# Patient Record
Sex: Male | Born: 1985 | Race: White | Hispanic: No | Marital: Single | Smoking: Former smoker
Health system: Southern US, Community
[De-identification: ages and names within clinical notes are randomized; demographics above are authoritative.]

## PROBLEM LIST (undated history)

## (undated) DIAGNOSIS — F419 Anxiety disorder, unspecified: Secondary | ICD-10-CM

## (undated) DIAGNOSIS — Z993 Dependence on wheelchair: Secondary | ICD-10-CM

## (undated) DIAGNOSIS — F329 Major depressive disorder, single episode, unspecified: Secondary | ICD-10-CM

## (undated) DIAGNOSIS — D497 Neoplasm of unspecified behavior of endocrine glands and other parts of nervous system: Secondary | ICD-10-CM

## (undated) DIAGNOSIS — R519 Headache, unspecified: Secondary | ICD-10-CM

## (undated) DIAGNOSIS — F32A Depression, unspecified: Secondary | ICD-10-CM

## (undated) DIAGNOSIS — G8250 Quadriplegia, unspecified: Secondary | ICD-10-CM

## (undated) DIAGNOSIS — R258 Other abnormal involuntary movements: Secondary | ICD-10-CM

## (undated) DIAGNOSIS — J189 Pneumonia, unspecified organism: Secondary | ICD-10-CM

## (undated) DIAGNOSIS — R252 Cramp and spasm: Secondary | ICD-10-CM

## (undated) HISTORY — DX: Quadriplegia, unspecified: G82.50

## (undated) HISTORY — DX: Cramp and spasm: R25.2

## (undated) HISTORY — DX: Dependence on wheelchair: Z99.3

## (undated) HISTORY — DX: Other abnormal involuntary movements: R25.8

## (undated) HISTORY — PX: NO PAST SURGERIES: SHX2092

## (undated) HISTORY — DX: Neoplasm of unspecified behavior of endocrine glands and other parts of nervous system: D49.7

---
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
## 2019-05-10 ENCOUNTER — Other Ambulatory Visit: Payer: Self-pay

## 2019-05-10 ENCOUNTER — Ambulatory Visit: Payer: Self-pay | Admitting: Neurology

## 2019-05-10 ENCOUNTER — Encounter: Payer: Self-pay | Admitting: Neurology

## 2019-05-10 VITALS — BP 125/81 | HR 103 | Temp 97.1°F | Ht 70.5 in | Wt 169.5 lb

## 2019-05-10 DIAGNOSIS — H539 Unspecified visual disturbance: Secondary | ICD-10-CM

## 2019-05-10 DIAGNOSIS — R292 Abnormal reflex: Secondary | ICD-10-CM | POA: Insufficient documentation

## 2019-05-10 DIAGNOSIS — R29898 Other symptoms and signs involving the musculoskeletal system: Secondary | ICD-10-CM

## 2019-05-10 DIAGNOSIS — R200 Anesthesia of skin: Secondary | ICD-10-CM | POA: Diagnosis not present

## 2019-05-10 NOTE — Progress Notes (Signed)
[facility]  PATIENT: Jacob Lambert DOB: [id number]  REFERRING DOCTOR OR PCP: Self-referred SOURCE: Patient, MRI images personally reviewed.  _________________________________   HISTORICAL  CHIEF COMPLAINT:  Chief Complaint  Patient presents with  . Follow-up    RM 13, alone.  Referral for MS/bilateral leg numbness/tingling/stiffness and tremor.   . Gait Problem    Ambulates with cane    HISTORY OF PRESENT ILLNESS:  I had the pleasure seeing Jacob Councilman MS center at [facility] for a neurologic consultation regarding his numbness, weakness and reduced balance over the past couple months.  He is a 34 year old man who has had issues with his gait about 3 months ago.   He felt stiff and also noted pain in his toes.    On April 1st he woke up and was unable to get out of bed due to weakness.   He went to urgent care and then was sent to the ED.    At the time, he was expeiencing pain in his feet.   He had a tremors in his legs.   He notes weakness in the legs, fairly symmetric.   Legs feel heavy.    He has had spasticity in his legs.   He is getting a squeezing sensation in the flank.   If he squats, he has trouble getting back up.   He has pain in the legs, seemingly more from muscles and joints than the skin.  He also has numbness in his legs.  Over the last week, he feels typing is more difficult and he shakes when he keyboards.   He started using a cane due to reduced balance in mid April.   He is especially having trouble going up and down stairs.  He has fallen a few times.  He noted more weakness in his legs after ejaculation.  He has noted mild urinary urgency.  He has also has noted ED the last month.     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   He also notes shapes are different with his right vision.   He has stopped driving due to his symptoms.     He had a lumbar MRI in [facility]  04/12/19.  I personally reviewed the images.  It is normal.  There is no significant disc degenerative changes.  The quality of the MRI is low and I do not get a good view of the conus medullaris.  He has mild psoriasis and eczema but is otherwise healthy.     He has no FH of MS.     REVIEW OF SYSTEMS: Constitutional: No fevers, chills, sweats, or change in appetite Eyes: No visual changes, double vision, eye pain Ear, nose and throat: No hearing loss, ear pain, nasal congestion, sore throat Cardiovascular: No chest pain, palpitations Respiratory: No shortness of breath at rest or with exertion.   No wheezes GastrointestinaI: No nausea, vomiting, diarrhea, abdominal pain, fecal incontinence Genitourinary:No dysuria.  He has noted ED and mild urinary urgency. Musculoskeletal: No neck pain, back pain Integumentary: No rash, pruritus, skin lesions at this time but he has a history of psoriasis/eczema Neurological: as above Psychiatric: No depression at this time.  No anxiety Endocrine: No palpitations, diaphoresis, change in appetite, change in weigh or increased thirst Hematologic/Lymphatic: No anemia, purpura, petechiae. Allergic/Immunologic: No itchy/runny eyes, nasal congestion, recent allergic reactions, rashes  ALLERGIES: Allergies  Allergen Reactions  .  Cephalosporins   . Other     Patagonion toothfish    HOME MEDICATIONS:  Current Outpatient Medications:  .  methocarbamol (ROBAXIN) 500 MG tablet, Take 500 mg by mouth 2 (two) times daily., Disp: , Rfl:   PAST MEDICAL HISTORY: No past medical history on file.  PAST SURGICAL HISTORY:   FAMILY HISTORY: No family history on file.  SOCIAL HISTORY:  Social History   Socioeconomic History  . Marital status: Single    Spouse name: Not on file  . Number of children: Not on file  . Years of education: Not on file  . Highest education level: Not on file  Occupational History  . Not on file  Tobacco Use  . Smoking  status: Former Research scientist (life sciences)  . Smokeless tobacco: Never Used  Substance and Sexual Activity  . Alcohol use: Yes    Comment: 2 bottles of wine per week  . Drug use: Not Currently  . Sexual activity: Not on file  Other Topics Concern  . Not on file  Social History Narrative   Lives with parents   Caffeine use: minimum- 3 cups per day (on average 6-10 cups per day)   Left handed    Social Determinants of Health   Financial Resource Strain:   . Difficulty of Paying Living Expenses:   Food Insecurity:   . Worried About Charity fundraiser in the Last Year:   . Arboriculturist in the Last Year:   Transportation Needs:   . Film/video editor (Medical):   Marland Kitchen Lack of Transportation (Non-Medical):   Physical Activity:   . Days of Exercise per Week:   . Minutes of Exercise per Session:   Stress:   . Feeling of Stress :   Social Connections:   . Frequency of Communication with Friends and Family:   . Frequency of Social Gatherings with Friends and Family:   . Attends Religious Services:   . Active Member of Clubs or Organizations:   . Attends Archivist Meetings:   Marland Kitchen Marital Status:   Intimate Partner Violence:   . Fear of Current or Ex-Partner:   . Emotionally Abused:   Marland Kitchen Physically Abused:   . Sexually Abused:      PHYSICAL EXAM  Vitals:   05/10/19 1319  BP: 125/81  Pulse: (!) 103  Temp: (!) 97.1 F (36.2 C)  Weight: 169 lb 8 oz (76.9 kg)  Height: 5' 10.5" (1.791 m)    Body mass index is 23.98 kg/m.   General: The patient is well-developed and well-nourished and in no acute distress  HEENT:  Head is [place]/AT.  Sclera are anicteric.  Funduscopic exam shows normal optic discs and retinal vessels.  Neck: No carotid bruits are noted.  The neck is nontender.  Cardiovascular: The heart has a regular rate and rhythm with a normal S1 and S2. There were no murmurs, gallops or rubs.    Skin: Extremities are without rash or  edema.  Musculoskeletal:  Back is  nontender  Neurologic Exam  Mental status: The patient is alert and oriented x 3 at the time of the examination. The patient has apparent normal recent and remote memory, with an apparently normal attention span and concentration ability.   Speech is normal.  Cranial nerves: Extraocular movements are full. Pupils are equal, round, and reactive to light and accomodation.  Visual fields are full.  Facial symmetry is present. There is good facial sensation to soft touch bilaterally.Facial strength is  normal.  Trapezius and sternocleidomastoid strength is normal. No dysarthria is noted.  The tongue is midline, and the patient has symmetric elevation of the soft palate. No obvious hearing deficits are noted.  Motor:  Muscle bulk is normal.   Tone is normal. Strength is  5 / 5 in all 4 extremities on isolated testing.  However, there is mild weakness in the left leg when standing on the left toes or heels.   Sensory: Sensory testing is intact to pinprick, soft touch and vibration sensation in the arms and allodynia in the feet.  Coordination: Cerebellar testing reveals good finger-nose-finger and heel-to-shin bilaterally.  Gait and station: Station is normal.   Gait is normal. Tandem gait is reduced. Romberg is negative.   Reflexes: Deep tendon reflexes are mildly increased in arms.  In legs, he has nonsustained clonus at the knees and ankles.    Plantar responses are flexor.      ASSESSMENT AND PLAN  Numbness - Plan: Vitamin B12, Copper, serum, MR BRAIN W WO CONTRAST, MR CERVICAL SPINE W WO CONTRAST, MR THORACIC SPINE W WO CONTRAST  Weakness of left lower extremity - Plan: MR CERVICAL SPINE W WO CONTRAST, MR THORACIC SPINE W WO CONTRAST  Hyperreflexia - Plan: Vitamin B12, Copper, serum, MR BRAIN W WO CONTRAST, MR CERVICAL SPINE W WO CONTRAST, MR THORACIC SPINE W WO CONTRAST  Visual disturbance - Plan: MR BRAIN W WO CONTRAST  In summary, Jacob Lambert is a 34 year old man with a 2 to 64-month  history of weakness pain, predominantly in the legs and reduced balance.  On examination, he has hyperreflexia.  I discussed with him that I am most concerned about a spinal cord process.  This could be an intrinsic or extrinsic myelopathy.  Demyelination such as MS is possible at his age.  We will check an MRI of the cervical and thoracic spine as well as the brain to determine if there is any evidence of demyelination, ischemia, other intrinsic myelopathy or a compressive extrinsic myelopathy such as large disc herniation or other process.  I will also check B12 and copper levels.  He is advised to avoid heavy lifting until a compressive myelopathy has been ruled out.     We will let him know the results of the studies and arrange follow-up based on the results.  In the interim, he should call us if he has any new or worsening neurologic symptoms.   Sadae Arrazola A. Felecia Shelling, MD, [facility] [id number], [id number] PM Certified in Neurology, Clinical Neurophysiology, Sleep Medicine and Neuroimaging  [facility] [address], [place], [place] [address] [phone]

## 2019-05-13 LAB — COPPER, SERUM: Copper: 105 ug/dL (ref 69–132)

## 2019-05-13 LAB — VITAMIN B12: Vitamin B-12: 474 pg/mL (ref 232–1245)

## 2019-05-15 ENCOUNTER — Telehealth: Payer: Self-pay | Admitting: *Deleted

## 2019-05-15 NOTE — Telephone Encounter (Signed)
-----   Message from Britt Bottom, MD sent at 05/13/2019  9:17 AM EDT ----- Please let the patient know that the lab work is fine.  We will let him know the MRi resukts after they are done

## 2019-05-15 NOTE — Telephone Encounter (Signed)
Called pt. Relayed Dr. Garth Bigness message. He verbalized understanding.

## 2019-05-18 ENCOUNTER — Telehealth: Payer: Self-pay | Admitting: Neurology

## 2019-05-18 NOTE — Telephone Encounter (Signed)
no to the covid questions MR Brain w/wo contrast, MR Cervical spine w/wo contrast & MR Thoracic spine w/wo contrast Dr. Cheree Ditto Auth: [id number] (exp. 05/18/19 to 11/13/19). Patient is scheduled at [facility] for 05/23/19.

## 2019-05-23 ENCOUNTER — Ambulatory Visit: Payer: BC Managed Care – PPO

## 2019-05-23 ENCOUNTER — Other Ambulatory Visit: Payer: Self-pay | Admitting: Neurology

## 2019-05-23 ENCOUNTER — Telehealth: Payer: Self-pay | Admitting: Neurology

## 2019-05-23 ENCOUNTER — Other Ambulatory Visit: Payer: Self-pay

## 2019-05-23 DIAGNOSIS — D497 Neoplasm of unspecified behavior of endocrine glands and other parts of nervous system: Secondary | ICD-10-CM

## 2019-05-23 DIAGNOSIS — H539 Unspecified visual disturbance: Secondary | ICD-10-CM | POA: Diagnosis not present

## 2019-05-23 DIAGNOSIS — C720 Malignant neoplasm of spinal cord: Secondary | ICD-10-CM | POA: Insufficient documentation

## 2019-05-23 DIAGNOSIS — R292 Abnormal reflex: Secondary | ICD-10-CM

## 2019-05-23 DIAGNOSIS — R29898 Other symptoms and signs involving the musculoskeletal system: Secondary | ICD-10-CM | POA: Diagnosis not present

## 2019-05-23 DIAGNOSIS — R200 Anesthesia of skin: Secondary | ICD-10-CM | POA: Diagnosis not present

## 2019-05-23 MED ORDER — GADOBENATE DIMEGLUMINE 529 MG/ML IV SOLN
15.0000 mL | Freq: Once | INTRAVENOUS | Status: AC | PRN
Start: 2019-05-23 — End: 2019-05-23
  Administered 2019-05-23: 15 mL via INTRAVENOUS

## 2019-05-23 NOTE — Telephone Encounter (Signed)
I spoke to Jacob Lambert about the MRI results.  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  There enhances heterogeneously and eccentrically.  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  I would like to get him in to see neuro-oncology and we will make a referral.

## 2019-05-24 ENCOUNTER — Telehealth: Payer: Self-pay | Admitting: Internal Medicine

## 2019-05-24 ENCOUNTER — Telehealth: Payer: Self-pay | Admitting: *Deleted

## 2019-05-24 NOTE — Telephone Encounter (Signed)
Received a new pt referral from Dr. Felecia Shelling at [facility] for Spinal cord tumor. Jacob Lambert has been cld and scheduled to see Dr. Mickeal Skinner on 5/20 at 10am. Pt aware to arrive 15 minutes early.

## 2019-05-24 NOTE — Telephone Encounter (Signed)
Dr Jennye Moccasin called to discuss Mr Brucato with Dr Mickeal Skinner. Can be reached at [phone]

## 2019-05-25 ENCOUNTER — Other Ambulatory Visit: Payer: Self-pay

## 2019-05-25 ENCOUNTER — Inpatient Hospital Stay: Payer: BC Managed Care – PPO | Attending: Internal Medicine | Admitting: Internal Medicine

## 2019-05-25 VITALS — BP 118/82 | HR 93 | Temp 98.6°F | Resp 18 | Ht 70.5 in | Wt 169.2 lb

## 2019-05-25 DIAGNOSIS — R531 Weakness: Secondary | ICD-10-CM | POA: Insufficient documentation

## 2019-05-25 DIAGNOSIS — R200 Anesthesia of skin: Secondary | ICD-10-CM | POA: Diagnosis not present

## 2019-05-25 DIAGNOSIS — Z87891 Personal history of nicotine dependence: Secondary | ICD-10-CM | POA: Insufficient documentation

## 2019-05-25 DIAGNOSIS — H5461 Unqualified visual loss, right eye, normal vision left eye: Secondary | ICD-10-CM | POA: Diagnosis not present

## 2019-05-25 DIAGNOSIS — R269 Unspecified abnormalities of gait and mobility: Secondary | ICD-10-CM | POA: Diagnosis not present

## 2019-05-25 DIAGNOSIS — D497 Neoplasm of unspecified behavior of endocrine glands and other parts of nervous system: Secondary | ICD-10-CM | POA: Insufficient documentation

## 2019-05-25 MED ORDER — DEXAMETHASONE 4 MG PO TABS
4.0000 mg | ORAL_TABLET | Freq: Every day | ORAL | 0 refills | Status: DC
Start: 2019-05-25 — End: 2019-06-08

## 2019-05-26 ENCOUNTER — Other Ambulatory Visit: Payer: Self-pay | Admitting: Radiation Therapy

## 2019-05-26 ENCOUNTER — Telehealth: Payer: Self-pay | Admitting: Internal Medicine

## 2019-05-26 ENCOUNTER — Other Ambulatory Visit: Payer: Self-pay | Admitting: Neurological Surgery

## 2019-05-26 DIAGNOSIS — R030 Elevated blood-pressure reading, without diagnosis of hypertension: Secondary | ICD-10-CM | POA: Diagnosis not present

## 2019-05-26 DIAGNOSIS — D497 Neoplasm of unspecified behavior of endocrine glands and other parts of nervous system: Secondary | ICD-10-CM | POA: Diagnosis not present

## 2019-05-26 NOTE — Progress Notes (Signed)
[facility] [place], Jacob Lambert [id number] [phone]   New Patient Evaluation  Date of Service: 05/26/19 Patient Name: Jacob Lambert Patient MRN: [id number] Patient DOB: 1985/07/01 Provider: Ventura Sellers, MD  Identifying Statement:  Jacob Lambert is a 34 y.o. male with cervical intramedullary mass who presents for initial consultation and evaluation.    Referring Provider: Britt Bottom, MD [place],   [id number]  Oncologic History: Oncology History   No history exists.    Biomarkers:  MGMT Unknown.  IDH 1/2 Unknown.  EGFR Unknown  TERT Unknown   History of Present Illness: The patient's records from the referring physician were obtained and reviewed and the patient interviewed to confirm this HPI.  Jacob Lambert presented to medical attention initially in February 2021 after noticing subtle gait impairment, most affecting him walking up stairs.  Symptoms evolved to include stiffness and weakness of both legs, involuntary tremulous movements of both legs, sensory changes, and sexual dysfunction.  There is also some numbness and weakness impacting his hands, described more as "clumsiness" expressed as difficulty with typing.  He is walking primarily with a cane at this time.  He underwent MRI of cervical spine earlier this week which demonstrated likely mass centered within the cord.  He is currently living with family locally because of disability, although has longer term residences both in [place].  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     Medications: Current Outpatient Medications on File Prior to Visit  Medication Sig Dispense Refill  . methocarbamol (ROBAXIN) 500 MG tablet Take 500 mg by mouth 2 (two) times daily.     No current facility-administered medications on file prior to visit.    Allergies:  Allergies  Allergen  Reactions  . Cephalosporins   . Other     Patagonion toothfish   Past Medical History: No past medical history on file. Past Surgical History: No past surgical history on file. Social History:  Social History   Socioeconomic History  . Marital status: Single    Spouse name: Not on file  . Number of children: Not on file  . Years of education: Not on file  . Highest education level: Not on file  Occupational History  . Not on file  Tobacco Use  . Smoking status: Former Research scientist (life sciences)  . Smokeless tobacco: Never Used  Substance and Sexual Activity  . Alcohol use: Yes    Comment: 2 bottles of wine per week  . Drug use: Not Currently  . Sexual activity: Not on file  Other Topics Concern  . Not on file  Social History Narrative   Lives with parents   Caffeine use: minimum- 3 cups per day (on average 6-10 cups per day)   Left handed    Social Determinants of Health   Financial Resource Strain:   . Difficulty of Paying Living Expenses:   Food Insecurity:   . Worried About Charity fundraiser in the Last Year:   . Arboriculturist in the Last Year:   Transportation Needs:   . Film/video editor (Medical):   Marland Kitchen Lack of Transportation (Non-Medical):   Physical Activity:   . Days of Exercise per Week:   . Minutes of Exercise per Session:   Stress:   . Feeling of Stress :   Social Connections:   . Frequency of  Communication with Friends and Family:   . Frequency of Social Gatherings with Friends and Family:   . Attends Religious Services:   . Active Member of Clubs or Organizations:   . Attends Archivist Meetings:   Marland Kitchen Marital Status:   Intimate Partner Violence:   . Fear of Current or Ex-Partner:   . Emotionally Abused:   Marland Kitchen Physically Abused:   . Sexually Abused:    Family History: No family history on file.  Review of Systems: Constitutional: Doesn't report fevers, chills or abnormal weight loss Eyes: Doesn't report blurriness of vision Ears, nose, mouth,  throat, and face: Doesn't report sore throat Respiratory: Doesn't report cough, dyspnea or wheezes Cardiovascular: Doesn't report palpitation, chest discomfort  Gastrointestinal:  Doesn't report nausea, constipation, diarrhea GU: Doesn't report incontinence Skin: Doesn't report skin rashes Neurological: Per HPI Musculoskeletal: Doesn't report joint pain Behavioral/Psych: Doesn't report anxiety  Physical Exam: Vitals:   05/25/19 1002  BP: 118/82  Pulse: 93  Resp: 18  Temp: 98.6 F (37 C)  SpO2: 100%   KPS: 80. General: Alert, cooperative, pleasant, in no acute distress Head: Normal EENT: No conjunctival injection or scleral icterus.  Lungs: Resp effort normal Cardiac: Regular rate Abdomen: Non-distended abdomen Skin: No rashes cyanosis or petechiae. Extremities: No clubbing or edema  Neurologic Exam: Mental Status: Awake, alert, attentive to examiner. Oriented to self and environment. Language is fluent with intact comprehension.  Cranial Nerves: Visual acuity is grossly normal. Visual fields are full. Extra-ocular movements intact. No ptosis. Face is symmetric Motor: Tone and bulk are normal. Power is grossly full in both arms and legs, with impairment of coordination. Reflexes are broadly increased with spontaneous clonus Sensory: Stocking impairment Gait: Spastic, assisted with cane  Labs: I have reviewed the data as listed No results found for: NA, K, CL, CO2, GLUCOSE, BUN, CREATININE, CALCIUM, PROT, ALBUMIN, AST, ALT, ALKPHOS, BILITOT, GFRNONAA, GFRAA No results found for: WBC, NEUTROABS, HGB, HCT, MCV, PLT  Imaging:  MR BRAIN W WO CONTRAST  Result Date: 05/23/2019  [facility] [address], [place], [place] [address] [phone] NEUROIMAGING REPORT STUDY DATE: 05/23/2019 PATIENT NAME: Jacob Lambert DOB: [birth year] MRN: [id number] EXAM: MRI Brain with and without contrast ORDERING CLINICIAN: Richard A. Sater, MD. PhD CLINICAL HISTORY:  34 year old man with numbness and hyperreflexia COMPARISON FILMS: None TECHNIQUE: MRI of the brain with and without contrast was obtained utilizing 5 mm axial slices with T1, T2, T2 flair, T2 star gradient echo and diffusion weighted views.  T1 sagittal, T2 coronal and postcontrast views in the axial and coronal plane were obtained. CONTRAST: 15 ml Magnevist IMAGING SITE: [facility], [address] FINDINGS: On sagittal images, the spinal cord is imaged caudally to C3 and is normal in caliber.   The contents of the posterior fossa are of normal size and position.   The pituitary gland and optic chiasm appear normal.    Brain volume appears normal.   The ventricles are normal in size and without distortion.  There is a Mega cisterna magna, normal variant The cerebellum and brainstem appears normal.   The deep gray matter appears normal.  The cerebral hemispheres appear normal.  Diffusion weighted images are normal.  Gradient echo heme weighted images are normal.  The orbits appear normal.   The VIIth/VIIIth nerve complex appears normal.  The mastoid air cells appear normal.  There is a mucous retention cyst at the roof of the right maxillary sinus.  The other paranasal  sinuses appear normal.  Flow voids are identified within the major intracerebral arteries.   After the infusion of contrast material, a normal enhancement pattern is noted.   This is a normal MRI of the brain with and without contrast INTERPRETING PHYSICIAN: Richard A. Felecia Shelling, MD, PhD, FAAN Certified in  Neuroimaging by [employer] [facility]  MR [place]  Result Date: 05/23/2019  [facility] [address], [place], [place] [address] [phone] NEUROIMAGING REPORT STUDY DATE: 05/23/2019 PATIENT NAME: Jacob Lambert DOB: [birth year] MRN: [id number] EXAM: MRI of the cervical spine with and without contrast ORDERING CLINICIAN: Richard A. Sater, MD. PhD CLINICAL HISTORY: 34 year old  man with numbness, left leg weakness and hyperreflexia COMPARISON FILMS: none TECHNIQUE: MRI of the cervical spine was obtained utilizing 3 mm sagittal slices from the posterior fossa down to the T2-T3 level with T1, T2 and inversion recovery views. In addition 4 mm axial slices from D6-3 down to T1-2 level were included with T2 and gradient echo views.  After the infusion contrast, additional T1-weighted images were performed. CONTRAST: 15 ml MultiHance IMAGING SITE: [facility], [address] [place], [place] [address] FINDINGS: :  On sagittal images, the spine is imaged from above the cervicomedullary junction to T2.   Visible brain appears normal.  Incidental note is made of a Mega cisterna magna.  Paravertebral soft tissue appears normal. There is a cystic spinal cord mass from C4-T2 that expands the spinal cord.  On T1-weighted images, it is isointense to spinal cord except for foci of chronic heme products.  On T2-weighted images, it has heterogenous signal.  There is hemosiderin within the mass adjacent to C5-C6 and T1 to T2.  The cystic component is multiloculated from C5-T1.  Narrowing of fluids are noted within the cysts.   After the administration of contrast, heterogenous enhancement is noted with the most intense enhancing signal adjacent to C6-C7, eccentric to the right. The vertebral bodies are normally aligned.   The vertebral bodies have normal signal.    The discs and interspaces were further evaluated on axial views from C2 to T1.  The discs and interspaces appear normal at each of these levels.  There is no nerve root compression.   This is an abnormal MRI of the cervical spine with and without contrast showing the following: 1.   Spinal cord mass from C4 to T2 with characteristics as detailed above.   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  Favoring ependymoma is the patient's age and associated hemorrhage.  However, it is eccentric with ill-defined  enhancement and has no bone remodeling which would be more likely with astrocytoma.   Hemangioblastomas and paragangliomas may also have associated hemorrhage but are more rare in the spinal cord.  2.   No significant degenerative changes noted. INTERPRETING PHYSICIAN: Richard A. Felecia Shelling, MD, PhD, FAAN Certified in  Neuroimaging by [facility] [employer] [facility]  Result Date: 05/23/2019 [facility] [address] [place], [place] [address] [phone] NEUROIMAGING REPORT STUDY DATE: 05/23/2019 PATIENT NAME: Jacob Lambert DOB: [birth year] MRN: [id number] EXAM: MRI of the thoracic spine with and without contrast ORDERING CLINICIAN: Richard A. Sater, MD. PhD CLINICAL HISTORY: 34 year old man with leg numbness, left leg weakness and hyperreflexia COMPARISON FILMS: None TECHNIQUE: MRI of the thoracic spine was obtained utilizing 3 mm sagittal slices from the posterior fossa from C7 to L1 level with T1, T2  and inversion recovery views. In addition 4 mm axial slices from P9J0 to D32I7 level were included with T2 and gradient echo views.  After the infusion contrast, additional T1-weighted images were performed. CONTRAST: 15 ml MultiHance IMAGING SITE: [facility], [address] [place], [place] [address] FINDINGS: :  On scout images, the spine is imaged from above the cervicomedullary junction to the lower thoracic spine.  There is a cystic lesion within the spinal cord extending from the cervical spine to T2.  It is high regardless on T2-weighted images with a cystic component centrally adjacent to C7 and hemosiderin adjacent to T1-T2.  The thoracic portion of the mass does not enhance.  Other characteristics of the lesion in the differential diagnosis is discussed further in the MRI of the cervical spine report also performed today. The vertebral bodies are normally aligned.   The vertebral bodies have normal signal.  The discs and interspaces  were further evaluated on axial views from C7 to T12-L1.  There were no significant degenerative changes noted.   This is an abnormal MRI of the thoracic spine with and without contrast showing a spinal cord mass extending from the cervical spine to the T2 level.  Hemosiderin is noted adjacent to T1 and T2.  There is a cystic component from C6-C7 to C7-T1.  This most likely represents either a spinal cord ependymoma or astrocytoma.  Further discussion is in the report from the MRI of the cervical spine also performed today. INTERPRETING PHYSICIAN: Richard A. 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  Highest on the differential is astrocytoma, followed by ependymoma.  We do not have reason to suspect non neoplastic etiology, such as inflammation, vascular malformation, although these possibilities do remain.    Clinically, it is unclear how much of his symptom burden is secondary to frank tumor vs compression from cystic changes.   At this time it is essential to obtain tissue.  We will refer him for evaluation with Dr. Zada Finders for surgical planning, which could involve biopsy or debulking of mass and decompression of cysts.   For present symptoms, will recommend initiation of dexamethasone '4mg'$  daily.  Screening for potential clinical trials was performed and discussed using eligibility criteria for active protocols at [facility], loco-regional tertiary centers, as well as national database available on [email or web address].com.    The patient is not a candidate for a research protocol at this time due to no suitable study identified.   We spent twenty additional minutes teaching regarding the natural history, biology, and historical experience  in the treatment of spine tumors. We then discussed in detail the current recommendations for therapy focusing on the mode of administration, mechanism of action, anticipated toxicities, and quality of life issues associated with this plan. We also provided teaching sheets for the patient to take home as an additional resource.  Will follow up via phone check-in on 5/24 following brain/spine tumor board discussion.  All questions were answered. The patient knows to call the clinic with any problems, questions or concerns. No barriers to learning were detected.  The total time spent in the encounter was 60 minutes and more than 50% was on counseling and review of test results   Ventura Sellers, MD Medical Director of  Neuro-Oncology [place] [facility] [place] 05/26/19 9:51 AM

## 2019-05-26 NOTE — Telephone Encounter (Signed)
Scheduled appt per 5/20 los.  Spoke with pt and they are aware of the appt date and time. 

## 2019-05-29 ENCOUNTER — Inpatient Hospital Stay (HOSPITAL_BASED_OUTPATIENT_CLINIC_OR_DEPARTMENT_OTHER): Payer: BC Managed Care – PPO | Admitting: Internal Medicine

## 2019-05-29 DIAGNOSIS — R200 Anesthesia of skin: Secondary | ICD-10-CM | POA: Diagnosis not present

## 2019-05-29 DIAGNOSIS — D497 Neoplasm of unspecified behavior of endocrine glands and other parts of nervous system: Secondary | ICD-10-CM | POA: Diagnosis not present

## 2019-05-29 DIAGNOSIS — R29898 Other symptoms and signs involving the musculoskeletal system: Secondary | ICD-10-CM | POA: Diagnosis not present

## 2019-05-29 NOTE — Progress Notes (Signed)
I connected with Jacob Lambert on [id number] at  9:00 AM EDT by telephone visit and verified that I am speaking with the correct person using two identifiers.  I discussed the limitations, risks, security and privacy concerns of performing an evaluation and management service by telemedicine and the availability of in-person appointments. I also discussed with the patient that there may be a patient responsible charge related to this service. The patient expressed understanding and agreed to proceed.  Other persons participating in the visit and their role in the encounter:  n/a  Patient's location:  Home  Provider's location:  Office  Chief Complaint:  Spinal cord tumor  Weakness of left lower extremity  Numbness   History of Present Ilness: Jacob Lambert describes no new or progressive symptoms.  Continues to have difficulty with ambulation and leg stiffness, numbness.   Observations: Language and cognition stable Assessment and Plan: Spinal cord tumor  Weakness of left lower extremity  Numbness  Discussed recommendations from brain/spine tumor board, which include stereotactic biopsy of mid cervical enhancing components, with possible additional debulking depending on surgical circumstances and frozen path.  Follow Up Instructions: Will come to see him post-operatively  I discussed the assessment and treatment plan with the patient.  The patient was provided an opportunity to ask questions and all were answered.  The patient agreed with the plan and demonstrated understanding of the instructions.    The patient was advised to call back or seek an in-person evaluation if the symptoms worsen or if the condition fails to improve as anticipated.  I provided 5-10 minutes of non-face-to-face time during this enocunter.  Ventura Sellers, MD   I provided 20 minutes of non face-to-face telephone visit time during this encounter, and > 50% was spent counseling as documented under my assessment &  plan.

## 2019-05-29 NOTE — Progress Notes (Signed)
Your procedure is scheduled on Thursday May 27  Report to [facility] Main Entrance "A" at 0530 A.M., and check in at the Admitting office.  Call this number if you have problems the morning of surgery:  [phone]  Call [phone] if you have any questions prior to your surgery date Monday-Friday 8am-4pm    Remember:  Do not eat or drink after midnight the night before your surgery   Take these medicines the morning of surgery with A SIP OF WATER  methocarbamol (ROBAXIN)  If needed  As of today, STOP taking any Aspirin (unless otherwise instructed by your surgeon) and Aspirin containing products, Aleve, Naproxen, Ibuprofen, Motrin, Advil, Goody's, BC's, all herbal medications, fish oil, and all vitamins.                      Do not wear jewelry            Do not wear lotions, powders, colognes, or deodorant.             Men may shave face and neck.            Do not bring valuables to the hospital.            [facility] is not responsible for any belongings or valuables.  Do NOT Smoke (Tobacco/Vapping) or drink Alcohol 24 hours prior to your procedure If you use a CPAP at night, you may bring all equipment for your overnight stay.   Contacts, glasses, dentures or bridgework may not be worn into surgery.      For patients admitted to the hospital, discharge time will be determined by your treatment team.   Patients discharged the day of surgery will not be allowed to drive home, and someone needs to stay with them for 24 hours.    Special instructions:   [facility]- Preparing For Surgery  Before surgery, you can play an important role. Because skin is not sterile, your skin needs to be as free of germs as possible. You can reduce the number of germs on your skin by washing with CHG (chlorahexidine gluconate) Soap before surgery.  CHG is an antiseptic cleaner which kills germs and bonds with the skin to continue killing germs even after washing.    Oral Hygiene is also  important to reduce your risk of infection.  Remember - BRUSH YOUR TEETH THE MORNING OF SURGERY WITH YOUR REGULAR TOOTHPASTE  Please do not use if you have an allergy to CHG or antibacterial soaps. If your skin becomes reddened/irritated stop using the CHG.  Do not shave (including legs and underarms) for at least 48 hours prior to first CHG shower. It is OK to shave your face.  Please follow these instructions carefully.   1. Shower the NIGHT BEFORE SURGERY and the MORNING OF SURGERY with CHG Soap.   2. If you chose to wash your hair, wash your hair first as usual with your normal shampoo.  3. After you shampoo, rinse your hair and body thoroughly to remove the shampoo.  4. Use CHG as you would any other liquid soap. You can apply CHG directly to the skin and wash gently with a scrungie or a clean washcloth.   5. Apply the CHG Soap to your body ONLY FROM THE NECK DOWN.  Do not use on open wounds or open sores. Avoid contact with your eyes, ears, mouth and genitals (private parts). Wash Face and genitals (private parts)  with your normal soap.   6. Wash thoroughly, paying special attention to the area where your surgery will be performed.  7. Thoroughly rinse your body with warm water from the neck down.  8. DO NOT shower/wash with your normal soap after using and rinsing off the CHG Soap.  9. Pat yourself dry with a CLEAN TOWEL.  10. Wear CLEAN PAJAMAS to bed the night before surgery, wear comfortable clothes the morning of surgery  11. Place CLEAN SHEETS on your bed the night of your first shower and DO NOT SLEEP WITH PETS.   Day of Surgery:   Do not apply any deodorants/lotions.  Please wear clean clothes to the hospital/surgery center.   Remember to brush your teeth WITH YOUR REGULAR TOOTHPASTE.   Please read over the following fact sheets that you were given.

## 2019-05-30 ENCOUNTER — Other Ambulatory Visit: Payer: Self-pay

## 2019-05-30 ENCOUNTER — Encounter (HOSPITAL_COMMUNITY)
Admission: RE | Admit: 2019-05-30 | Discharge: 2019-05-30 | Disposition: A | Discharge status: Home / Self Care | Payer: BC Managed Care – PPO | Source: Ambulatory Visit | Attending: Neurological Surgery | Admitting: Neurological Surgery

## 2019-05-30 ENCOUNTER — Other Ambulatory Visit (HOSPITAL_COMMUNITY)
Admission: RE | Admit: 2019-05-30 | Discharge: 2019-05-30 | Disposition: A | Discharge status: Home / Self Care | Payer: BC Managed Care – PPO | Source: Ambulatory Visit | Attending: Neurological Surgery | Admitting: Neurological Surgery

## 2019-05-30 ENCOUNTER — Encounter (HOSPITAL_COMMUNITY): Payer: Self-pay

## 2019-05-30 DIAGNOSIS — G8250 Quadriplegia, unspecified: Secondary | ICD-10-CM | POA: Diagnosis not present

## 2019-05-30 DIAGNOSIS — D497 Neoplasm of unspecified behavior of endocrine glands and other parts of nervous system: Secondary | ICD-10-CM | POA: Diagnosis not present

## 2019-05-30 DIAGNOSIS — Z01812 Encounter for preprocedural laboratory examination: Secondary | ICD-10-CM | POA: Insufficient documentation

## 2019-05-30 DIAGNOSIS — G253 Myoclonus: Secondary | ICD-10-CM | POA: Diagnosis not present

## 2019-05-30 DIAGNOSIS — Z85848 Personal history of malignant neoplasm of other parts of nervous tissue: Secondary | ICD-10-CM | POA: Diagnosis not present

## 2019-05-30 DIAGNOSIS — G43909 Migraine, unspecified, not intractable, without status migrainosus: Secondary | ICD-10-CM | POA: Diagnosis not present

## 2019-05-30 DIAGNOSIS — R270 Ataxia, unspecified: Secondary | ICD-10-CM | POA: Diagnosis not present

## 2019-05-30 DIAGNOSIS — Z888 Allergy status to other drugs, medicaments and biological substances status: Secondary | ICD-10-CM | POA: Diagnosis not present

## 2019-05-30 DIAGNOSIS — D72829 Elevated white blood cell count, unspecified: Secondary | ICD-10-CM | POA: Diagnosis not present

## 2019-05-30 DIAGNOSIS — R258 Other abnormal involuntary movements: Secondary | ICD-10-CM | POA: Diagnosis not present

## 2019-05-30 DIAGNOSIS — R7401 Elevation of levels of liver transaminase levels: Secondary | ICD-10-CM | POA: Diagnosis not present

## 2019-05-30 DIAGNOSIS — K5900 Constipation, unspecified: Secondary | ICD-10-CM | POA: Diagnosis not present

## 2019-05-30 DIAGNOSIS — H548 Legal blindness, as defined in USA: Secondary | ICD-10-CM | POA: Diagnosis not present

## 2019-05-30 DIAGNOSIS — D434 Neoplasm of uncertain behavior of spinal cord: Secondary | ICD-10-CM | POA: Diagnosis not present

## 2019-05-30 DIAGNOSIS — F418 Other specified anxiety disorders: Secondary | ICD-10-CM | POA: Diagnosis not present

## 2019-05-30 DIAGNOSIS — R52 Pain, unspecified: Secondary | ICD-10-CM | POA: Diagnosis not present

## 2019-05-30 DIAGNOSIS — R000 Tachycardia, unspecified: Secondary | ICD-10-CM | POA: Diagnosis not present

## 2019-05-30 DIAGNOSIS — Z881 Allergy status to other antibiotic agents status: Secondary | ICD-10-CM | POA: Diagnosis not present

## 2019-05-30 DIAGNOSIS — Z79899 Other long term (current) drug therapy: Secondary | ICD-10-CM | POA: Diagnosis not present

## 2019-05-30 DIAGNOSIS — Z20822 Contact with and (suspected) exposure to covid-19: Secondary | ICD-10-CM | POA: Diagnosis not present

## 2019-05-30 DIAGNOSIS — C719 Malignant neoplasm of brain, unspecified: Secondary | ICD-10-CM | POA: Diagnosis not present

## 2019-05-30 DIAGNOSIS — Z981 Arthrodesis status: Secondary | ICD-10-CM | POA: Diagnosis not present

## 2019-05-30 DIAGNOSIS — G8918 Other acute postprocedural pain: Secondary | ICD-10-CM | POA: Diagnosis not present

## 2019-05-30 DIAGNOSIS — Z87891 Personal history of nicotine dependence: Secondary | ICD-10-CM | POA: Diagnosis not present

## 2019-05-30 DIAGNOSIS — R252 Cramp and spasm: Secondary | ICD-10-CM | POA: Diagnosis not present

## 2019-05-30 DIAGNOSIS — M7989 Other specified soft tissue disorders: Secondary | ICD-10-CM | POA: Diagnosis not present

## 2019-05-30 DIAGNOSIS — G959 Disease of spinal cord, unspecified: Secondary | ICD-10-CM | POA: Diagnosis not present

## 2019-05-30 DIAGNOSIS — M62838 Other muscle spasm: Secondary | ICD-10-CM | POA: Diagnosis not present

## 2019-05-30 DIAGNOSIS — Z4789 Encounter for other orthopedic aftercare: Secondary | ICD-10-CM | POA: Diagnosis not present

## 2019-05-30 DIAGNOSIS — Z91013 Allergy to seafood: Secondary | ICD-10-CM | POA: Diagnosis not present

## 2019-05-30 DIAGNOSIS — K592 Neurogenic bowel, not elsewhere classified: Secondary | ICD-10-CM | POA: Diagnosis not present

## 2019-05-30 DIAGNOSIS — C760 Malignant neoplasm of head, face and neck: Secondary | ICD-10-CM | POA: Diagnosis not present

## 2019-05-30 HISTORY — DX: Pneumonia, unspecified organism: J18.9

## 2019-05-30 HISTORY — DX: Depression, unspecified: F32.A

## 2019-05-30 HISTORY — DX: Anxiety disorder, unspecified: F41.9

## 2019-05-30 HISTORY — DX: Headache, unspecified: R51.9

## 2019-05-30 LAB — CBC
HCT: 43.2 % (ref 39.0–52.0)
Hemoglobin: 14 g/dL (ref 13.0–17.0)
MCH: 28.4 pg (ref 26.0–34.0)
MCHC: 32.4 g/dL (ref 30.0–36.0)
MCV: 87.6 fL (ref 80.0–100.0)
Platelets: 259 10*3/uL (ref 150–400)
RBC: 4.93 MIL/uL (ref 4.22–5.81)
RDW: 13.1 % (ref 11.5–15.5)
WBC: 11.3 10*3/uL — ABNORMAL HIGH (ref 4.0–10.5)
nRBC: 0 % (ref 0.0–0.2)

## 2019-05-30 LAB — SURGICAL PCR SCREEN
MRSA, PCR: NEGATIVE
Staphylococcus aureus: NEGATIVE

## 2019-05-30 LAB — SARS CORONAVIRUS 2 (TAT 6-24 HRS): SARS Coronavirus 2: NEGATIVE

## 2019-05-30 NOTE — Progress Notes (Addendum)
PCP - none  Cardiologist - no  Chest x-ray - na  EKG - na  Stress Test - no  ECHO - no  Cardiac Cath - no  Sleep Study - no CPAP - no  LABS-CBC, PCR  ASA- no  ERAS-no  HA1C-na Fasting Blood Sugar - na Checks Blood Sugar __0___ times a day  Anesthesia-  Pt denies having chest pain, sob, or fever at this time. All instructions explained to the pt, with a verbal understanding of the material. Pt agrees to go over the instructions while at home for a better understanding. Pt also instructed to self quarantine after being tested for COVID-19. The opportunity to ask questions was provided.  I was reviewing Rudolph Seliger chart after PAT visit and saw how much alcohol  patient drinks,  I checked to see if patient had a CMP result in Epic, there is not one.  I spoke to Willeen Cass, NP and she said we need to get a stat CMP on day of surgery, if it is resulted prior to surgery or if not patient will need to come in for a blood draw tomorrow. I checked and turn around time is approximately 1 hour,CMP will be drawn stat on DOS. hour.

## 2019-05-30 NOTE — Progress Notes (Addendum)
Your procedure is scheduled on Thursday May 27  Report to [facility] Main Entrance "A" at 0530 A.M., and check in at the Admitting office.              Your surgery or procedure is scheduled for 7:30 AM  Call this number if you have problems the morning of surgery: [phone]  Call [phone] if you have any questions prior to your surgery date Monday-Friday 8am-4pm   Remember:  Do not eat or drink after midnight the night before your surgery   Take these medicines the morning of surgery with A SIP OF WATER:     [place] methocarbamol (ROBAXIN)  If needed  As of today, STOP taking any Aspirin (unless otherwise instructed by your surgeon) and Aspirin containing products, Aleve, Naproxen, Ibuprofen, Motrin, Advil, Goody's, BC's, all herbal medications, fish oil, and all vitamins.   Special instructions:   [place]- Preparing For Surgery  Before surgery, you can play an important role. Because skin is not sterile, your skin needs to be as free of germs as possible. You can reduce the number of germs on your skin by washing with CHG (chlorahexidine gluconate) Soap before surgery.  CHG is an antiseptic cleaner which kills germs and bonds with the skin to continue killing germs even after washing.    Oral Hygiene is also important to reduce your risk of infection.  Remember - BRUSH YOUR TEETH THE MORNING OF SURGERY WITH YOUR REGULAR TOOTHPASTE  Please do not use if you have an allergy to CHG or antibacterial soaps. If your skin becomes reddened/irritated stop using the CHG.  Do not shave (including legs and underarms) for at least 48 hours prior to first CHG shower. It is OK to shave your face.  Please follow these instructions carefully.   1. Shower the NIGHT BEFORE SURGERY and the MORNING OF SURGERY with CHG Soap.   2. If you chose to wash your hair, wash your hair first as usual with your normal shampoo.  3. After you shampoo,wash your face and private area with the soap you  use at home, then rinse your hair and body thoroughly to remove the shampoo and soap.   4. Use CHG as you would any other liquid soap. You can apply CHG directly to the skin and wash gently with a scrungie or a clean washcloth.   5. Apply the CHG Soap to your body ONLY FROM THE NECK DOWN.  Do not use on open wounds or open sores. Avoid contact with your eyes, ears, mouth and genitals (private parts).   6. Wash thoroughly, paying special attention to the area where your surgery will be performed.  7. Thoroughly rinse your body with warm water from the neck down.  8. DO NOT shower/wash with your normal soap after using and rinsing off the CHG Soap.  9. Pat yourself dry with a CLEAN TOWEL.  10. Wear CLEAN PAJAMAS to bed the night before surgery, wear comfortable clothes the morning of surgery  11. Place CLEAN SHEETS on your bed the night of your first shower and DO NOT SLEEP WITH PETS.  Day of Surgery: Shower as instructed above. Do not wear lotions, powders, colognes, or deodorant. Please wear clean clothes to the hospital/surgery center.   Remember to brush your teeth WITH YOUR REGULAR TOOTHPASTE.             Do not wear jewelry  Men may shave face and neck.             Do not bring valuables to the hospital.             [facility] is not responsible for any belongings or valuables.  Do NOT Smoke (Tobacco/Vapping) or drink Alcohol 24 hours prior to your procedure If you use a CPAP at night, you may bring all equipment for your overnight stay.   Contacts, glasses, dentures or bridgework may not be worn into surgery.      For patients admitted to the hospital, discharge time will be determined by your treatment team.   Patients discharged the day of surgery will not be allowed to drive home, and someone needs to stay with them for 24 hours. Please read over the following fact sheets that you were given.

## 2019-06-01 ENCOUNTER — Inpatient Hospital Stay (HOSPITAL_COMMUNITY)
Admission: RE | Admit: 2019-06-01 | Discharge: 2019-06-08 | DRG: 029 | Disposition: A | Discharge status: Rehabilitation Facility | Payer: BC Managed Care – PPO | Attending: Neurological Surgery | Admitting: Neurological Surgery

## 2019-06-01 ENCOUNTER — Encounter (HOSPITAL_COMMUNITY): Payer: Self-pay | Admitting: Neurological Surgery

## 2019-06-01 ENCOUNTER — Inpatient Hospital Stay (HOSPITAL_COMMUNITY): Payer: BC Managed Care – PPO | Admitting: Certified Registered"

## 2019-06-01 ENCOUNTER — Inpatient Hospital Stay (HOSPITAL_COMMUNITY): Payer: BC Managed Care – PPO

## 2019-06-01 ENCOUNTER — Other Ambulatory Visit: Payer: Self-pay

## 2019-06-01 ENCOUNTER — Encounter (HOSPITAL_COMMUNITY)
Admission: RE | Disposition: A | Discharge status: Still In Hospital | Payer: Self-pay | Source: Ambulatory Visit | Attending: Neurological Surgery

## 2019-06-01 DIAGNOSIS — M7989 Other specified soft tissue disorders: Secondary | ICD-10-CM | POA: Diagnosis not present

## 2019-06-01 DIAGNOSIS — R270 Ataxia, unspecified: Secondary | ICD-10-CM | POA: Diagnosis present

## 2019-06-01 DIAGNOSIS — H548 Legal blindness, as defined in USA: Secondary | ICD-10-CM | POA: Diagnosis present

## 2019-06-01 DIAGNOSIS — C720 Malignant neoplasm of spinal cord: Secondary | ICD-10-CM | POA: Diagnosis present

## 2019-06-01 DIAGNOSIS — D72829 Elevated white blood cell count, unspecified: Secondary | ICD-10-CM | POA: Diagnosis not present

## 2019-06-01 DIAGNOSIS — Z79899 Other long term (current) drug therapy: Secondary | ICD-10-CM | POA: Diagnosis not present

## 2019-06-01 DIAGNOSIS — G959 Disease of spinal cord, unspecified: Secondary | ICD-10-CM | POA: Diagnosis present

## 2019-06-01 DIAGNOSIS — Z981 Arthrodesis status: Secondary | ICD-10-CM | POA: Diagnosis not present

## 2019-06-01 DIAGNOSIS — Z87891 Personal history of nicotine dependence: Secondary | ICD-10-CM | POA: Diagnosis not present

## 2019-06-01 DIAGNOSIS — M62838 Other muscle spasm: Secondary | ICD-10-CM | POA: Diagnosis present

## 2019-06-01 DIAGNOSIS — Z888 Allergy status to other drugs, medicaments and biological substances status: Secondary | ICD-10-CM | POA: Diagnosis not present

## 2019-06-01 DIAGNOSIS — C719 Malignant neoplasm of brain, unspecified: Secondary | ICD-10-CM | POA: Diagnosis present

## 2019-06-01 DIAGNOSIS — F418 Other specified anxiety disorders: Secondary | ICD-10-CM | POA: Diagnosis present

## 2019-06-01 DIAGNOSIS — R258 Other abnormal involuntary movements: Secondary | ICD-10-CM | POA: Diagnosis present

## 2019-06-01 DIAGNOSIS — K5900 Constipation, unspecified: Secondary | ICD-10-CM | POA: Diagnosis not present

## 2019-06-01 DIAGNOSIS — Z419 Encounter for procedure for purposes other than remedying health state, unspecified: Secondary | ICD-10-CM

## 2019-06-01 DIAGNOSIS — G8250 Quadriplegia, unspecified: Secondary | ICD-10-CM

## 2019-06-01 DIAGNOSIS — Z20822 Contact with and (suspected) exposure to covid-19: Secondary | ICD-10-CM | POA: Diagnosis present

## 2019-06-01 DIAGNOSIS — R52 Pain, unspecified: Secondary | ICD-10-CM | POA: Diagnosis not present

## 2019-06-01 DIAGNOSIS — G8918 Other acute postprocedural pain: Secondary | ICD-10-CM | POA: Diagnosis not present

## 2019-06-01 DIAGNOSIS — R252 Cramp and spasm: Secondary | ICD-10-CM | POA: Diagnosis not present

## 2019-06-01 DIAGNOSIS — R7401 Elevation of levels of liver transaminase levels: Secondary | ICD-10-CM | POA: Diagnosis not present

## 2019-06-01 DIAGNOSIS — K592 Neurogenic bowel, not elsewhere classified: Secondary | ICD-10-CM | POA: Diagnosis not present

## 2019-06-01 DIAGNOSIS — D497 Neoplasm of unspecified behavior of endocrine glands and other parts of nervous system: Secondary | ICD-10-CM | POA: Diagnosis not present

## 2019-06-01 DIAGNOSIS — G253 Myoclonus: Secondary | ICD-10-CM | POA: Diagnosis not present

## 2019-06-01 HISTORY — PX: LAMINECTOMY: SHX219

## 2019-06-01 LAB — COMPREHENSIVE METABOLIC PANEL
ALT: 27 U/L (ref 0–44)
AST: 35 U/L (ref 15–41)
Albumin: 4.3 g/dL (ref 3.5–5.0)
Alkaline Phosphatase: 32 U/L — ABNORMAL LOW (ref 38–126)
Anion gap: 13 (ref 5–15)
BUN: 17 mg/dL (ref 6–20)
CO2: 22 mmol/L (ref 22–32)
Calcium: 9.6 mg/dL (ref 8.9–10.3)
Chloride: 102 mmol/L (ref 98–111)
Creatinine, Ser: 1.19 mg/dL (ref 0.61–1.24)
GFR calc Af Amer: >60 mL/min (ref >60)
GFR calc non Af Amer: >60 mL/min (ref >60)
Glucose, Bld: 89 mg/dL (ref 70–99)
Potassium: 4.4 mmol/L (ref 3.5–5.1)
Sodium: 137 mmol/L (ref 135–145)
Total Bilirubin: 0.4 mg/dL (ref 0.3–1.2)
Total Protein: 7.3 g/dL (ref 6.5–8.1)

## 2019-06-01 SURGERY — CERVICAL LAMINECTOMY FOR TUMOR
Anesthesia: General | Site: Neck

## 2019-06-01 MED ORDER — EPHEDRINE SULFATE-NACL 50-0.9 MG/10ML-% IV SOSY
PREFILLED_SYRINGE | INTRAVENOUS | Status: DC | PRN
  Administered 2019-06-01: 5 mg via INTRAVENOUS

## 2019-06-01 MED ORDER — NEOSTIGMINE METHYLSULFATE 3 MG/3ML IV SOSY
PREFILLED_SYRINGE | INTRAVENOUS | Status: AC
  Filled 2019-06-01: qty 3

## 2019-06-01 MED ORDER — BACITRACIN ZINC 500 UNIT/GM EX OINT
TOPICAL_OINTMENT | CUTANEOUS | Status: AC
  Filled 2019-06-01: qty 28.35

## 2019-06-01 MED ORDER — VANCOMYCIN HCL IN DEXTROSE 1-5 GM/200ML-% IV SOLN
1000.0000 mg | Freq: Two times a day (BID) | INTRAVENOUS | Status: AC
  Administered 2019-06-01 – 2019-06-02 (×2): 1000 mg via INTRAVENOUS
  Filled 2019-06-01 (×2): qty 200

## 2019-06-01 MED ORDER — ALBUMIN HUMAN 5 % IV SOLN: INTRAVENOUS | Status: DC | PRN

## 2019-06-01 MED ORDER — THROMBIN 20000 UNITS EX SOLR: CUTANEOUS | Status: DC | PRN

## 2019-06-01 MED ORDER — PHENYLEPHRINE 40 MCG/ML (10ML) SYRINGE FOR IV PUSH (FOR BLOOD PRESSURE SUPPORT)
PREFILLED_SYRINGE | INTRAVENOUS | Status: AC
  Filled 2019-06-01: qty 10

## 2019-06-01 MED ORDER — ONDANSETRON HCL 4 MG/2ML IJ SOLN: 4.0000 mg | Freq: Once | INTRAMUSCULAR | Status: DC | PRN

## 2019-06-01 MED ORDER — ROCURONIUM BROMIDE 10 MG/ML (PF) SYRINGE
PREFILLED_SYRINGE | INTRAVENOUS | Status: AC
  Filled 2019-06-01: qty 10

## 2019-06-01 MED ORDER — PHENYLEPHRINE HCL-NACL 10-0.9 MG/250ML-% IV SOLN
INTRAVENOUS | Status: DC | PRN
  Administered 2019-06-01: 50 ug/min via INTRAVENOUS

## 2019-06-01 MED ORDER — SODIUM CHLORIDE 0.9 % IV SOLN: 250.0000 mL | INTRAVENOUS | Status: DC

## 2019-06-01 MED ORDER — ONDANSETRON HCL 4 MG/2ML IJ SOLN
INTRAMUSCULAR | Status: AC
  Filled 2019-06-01: qty 2

## 2019-06-01 MED ORDER — FENTANYL CITRATE (PF) 100 MCG/2ML IJ SOLN
INTRAMUSCULAR | Status: DC | PRN
  Administered 2019-06-01: 50 ug via INTRAVENOUS
  Administered 2019-06-01: 100 ug via INTRAVENOUS
  Administered 2019-06-01 (×2): 50 ug via INTRAVENOUS
  Administered 2019-06-01: 100 ug via INTRAVENOUS
  Administered 2019-06-01 (×5): 50 ug via INTRAVENOUS
  Administered 2019-06-01: 100 ug via INTRAVENOUS
  Administered 2019-06-01: 50 ug via INTRAVENOUS

## 2019-06-01 MED ORDER — BUPIVACAINE HCL (PF) 0.5 % IJ SOLN
INTRAMUSCULAR | Status: AC
  Filled 2019-06-01: qty 30

## 2019-06-01 MED ORDER — MENTHOL 3 MG MT LOZG
1.0000 | LOZENGE | OROMUCOSAL | Status: DC | PRN
  Filled 2019-06-01: qty 9

## 2019-06-01 MED ORDER — SUCCINYLCHOLINE CHLORIDE 200 MG/10ML IV SOSY
PREFILLED_SYRINGE | INTRAVENOUS | Status: AC
  Filled 2019-06-01: qty 10

## 2019-06-01 MED ORDER — OXYCODONE HCL 5 MG PO TABS: 5.0000 mg | ORAL_TABLET | ORAL | Status: DC | PRN

## 2019-06-01 MED ORDER — LIDOCAINE 2% (20 MG/ML) 5 ML SYRINGE
INTRAMUSCULAR | Status: AC
  Filled 2019-06-01: qty 5

## 2019-06-01 MED ORDER — SUCCINYLCHOLINE CHLORIDE 200 MG/10ML IV SOSY
PREFILLED_SYRINGE | INTRAVENOUS | Status: DC | PRN
Start: 2019-06-01 — End: 2019-06-01
  Administered 2019-06-01: 160 mg via INTRAVENOUS

## 2019-06-01 MED ORDER — MIDAZOLAM HCL 5 MG/5ML IJ SOLN
INTRAMUSCULAR | Status: DC | PRN
  Administered 2019-06-01: 2 mg via INTRAVENOUS

## 2019-06-01 MED ORDER — LIDOCAINE 2% (20 MG/ML) 5 ML SYRINGE
INTRAMUSCULAR | Status: DC | PRN
  Administered 2019-06-01: 100 mg via INTRAVENOUS

## 2019-06-01 MED ORDER — HYDROMORPHONE HCL 1 MG/ML IJ SOLN
INTRAMUSCULAR | Status: AC
  Filled 2019-06-01: qty 1

## 2019-06-01 MED ORDER — OXYCODONE HCL 5 MG PO TABS
10.0000 mg | ORAL_TABLET | ORAL | Status: DC | PRN
  Administered 2019-06-01 – 2019-06-08 (×19): 10 mg via ORAL
  Filled 2019-06-01 (×17): qty 2

## 2019-06-01 MED ORDER — HYDROMORPHONE HCL 1 MG/ML IJ SOLN
0.2500 mg | INTRAMUSCULAR | Status: DC | PRN
  Administered 2019-06-01 (×2): 0.5 mg via INTRAVENOUS

## 2019-06-01 MED ORDER — FENTANYL CITRATE (PF) 250 MCG/5ML IJ SOLN
INTRAMUSCULAR | Status: AC
  Filled 2019-06-01: qty 5

## 2019-06-01 MED ORDER — EPHEDRINE 5 MG/ML INJ
INTRAVENOUS | Status: AC
  Filled 2019-06-01: qty 10

## 2019-06-01 MED ORDER — ONDANSETRON HCL 4 MG/2ML IJ SOLN
4.0000 mg | Freq: Four times a day (QID) | INTRAMUSCULAR | Status: DC | PRN
  Administered 2019-06-01 – 2019-06-02 (×2): 4 mg via INTRAVENOUS
  Filled 2019-06-01 (×2): qty 2

## 2019-06-01 MED ORDER — LIDOCAINE-EPINEPHRINE 1 %-1:100000 IJ SOLN
INTRAMUSCULAR | Status: DC | PRN
  Administered 2019-06-01: 10 mL

## 2019-06-01 MED ORDER — SODIUM CHLORIDE 0.9% FLUSH
3.0000 mL | Freq: Two times a day (BID) | INTRAVENOUS | Status: DC
  Administered 2019-06-02 – 2019-06-08 (×13): 3 mL via INTRAVENOUS

## 2019-06-01 MED ORDER — ARTIFICIAL TEARS OPHTHALMIC OINT
TOPICAL_OINTMENT | OPHTHALMIC | Status: DC | PRN
  Administered 2019-06-01: 1 via OPHTHALMIC

## 2019-06-01 MED ORDER — THROMBIN 5000 UNITS EX SOLR
CUTANEOUS | Status: AC
  Filled 2019-06-01: qty 5000

## 2019-06-01 MED ORDER — MIDAZOLAM HCL 2 MG/2ML IJ SOLN
INTRAMUSCULAR | Status: AC
  Filled 2019-06-01: qty 2

## 2019-06-01 MED ORDER — THROMBIN 5000 UNITS EX SOLR: OROMUCOSAL | Status: DC | PRN

## 2019-06-01 MED ORDER — ONDANSETRON HCL 4 MG PO TABS
4.0000 mg | ORAL_TABLET | Freq: Four times a day (QID) | ORAL | Status: DC | PRN
  Administered 2019-06-05: 4 mg via ORAL
  Filled 2019-06-01: qty 1

## 2019-06-01 MED ORDER — SODIUM CHLORIDE (PF) 0.9 % IJ SOLN
INTRAMUSCULAR | Status: AC
  Filled 2019-06-01: qty 10

## 2019-06-01 MED ORDER — GLYCOPYRROLATE PF 0.2 MG/ML IJ SOSY
PREFILLED_SYRINGE | INTRAMUSCULAR | Status: AC
  Filled 2019-06-01: qty 1

## 2019-06-01 MED ORDER — DEXAMETHASONE SODIUM PHOSPHATE 10 MG/ML IJ SOLN
INTRAMUSCULAR | Status: AC
  Filled 2019-06-01: qty 1

## 2019-06-01 MED ORDER — SODIUM CHLORIDE 0.9 % IV SOLN: INTRAVENOUS | Status: DC | PRN

## 2019-06-01 MED ORDER — ACETAMINOPHEN 650 MG RE SUPP: 650.0000 mg | RECTAL | Status: DC | PRN

## 2019-06-01 MED ORDER — GADOBUTROL 1 MMOL/ML IV SOLN
7.5000 mL | Freq: Once | INTRAVENOUS | Status: AC | PRN
  Administered 2019-06-01: 7.5 mL via INTRAVENOUS

## 2019-06-01 MED ORDER — SODIUM CHLORIDE 0.9% FLUSH: 3.0000 mL | INTRAVENOUS | Status: DC | PRN

## 2019-06-01 MED ORDER — CHLORHEXIDINE GLUCONATE 0.12 % MT SOLN
15.0000 mL | Freq: Once | OROMUCOSAL | Status: AC
  Administered 2019-06-01: 15 mL via OROMUCOSAL
  Filled 2019-06-01: qty 15

## 2019-06-01 MED ORDER — LACTATED RINGERS IV SOLN: INTRAVENOUS | Status: DC

## 2019-06-01 MED ORDER — HYDROMORPHONE HCL 1 MG/ML IJ SOLN
1.0000 mg | INTRAMUSCULAR | Status: DC | PRN
  Administered 2019-06-01 – 2019-06-05 (×8): 1 mg via INTRAVENOUS
  Filled 2019-06-01 (×8): qty 1

## 2019-06-01 MED ORDER — MEPERIDINE HCL 25 MG/ML IJ SOLN: 6.2500 mg | INTRAMUSCULAR | Status: DC | PRN

## 2019-06-01 MED ORDER — LIDOCAINE-EPINEPHRINE 1 %-1:100000 IJ SOLN
INTRAMUSCULAR | Status: AC
  Filled 2019-06-01: qty 1

## 2019-06-01 MED ORDER — THROMBIN 20000 UNITS EX SOLR
CUTANEOUS | Status: AC
  Filled 2019-06-01: qty 20000

## 2019-06-01 MED ORDER — CHLORHEXIDINE GLUCONATE CLOTH 2 % EX PADS
6.0000 | MEDICATED_PAD | Freq: Every day | CUTANEOUS | Status: DC
  Administered 2019-06-02 – 2019-06-08 (×7): 6 via TOPICAL

## 2019-06-01 MED ORDER — PROPOFOL 500 MG/50ML IV EMUL
INTRAVENOUS | Status: DC | PRN
Start: 2019-06-01 — End: 2019-06-01
  Administered 2019-06-01: 75 ug/kg/min via INTRAVENOUS
  Administered 2019-06-01: 100 ug/kg/min via INTRAVENOUS

## 2019-06-01 MED ORDER — PROPOFOL 10 MG/ML IV BOLUS
INTRAVENOUS | Status: AC
  Filled 2019-06-01: qty 20

## 2019-06-01 MED ORDER — PHENOL 1.4 % MT LIQD: 1.0000 | OROMUCOSAL | Status: DC | PRN

## 2019-06-01 MED ORDER — ARTIFICIAL TEARS OPHTHALMIC OINT
TOPICAL_OINTMENT | OPHTHALMIC | Status: AC
  Filled 2019-06-01: qty 3.5

## 2019-06-01 MED ORDER — DEXAMETHASONE SODIUM PHOSPHATE 10 MG/ML IJ SOLN
INTRAMUSCULAR | Status: DC | PRN
  Administered 2019-06-01: 10 mg via INTRAVENOUS

## 2019-06-01 MED ORDER — ACETAMINOPHEN 325 MG PO TABS
650.0000 mg | ORAL_TABLET | ORAL | Status: DC | PRN
  Administered 2019-06-02 – 2019-06-07 (×4): 650 mg via ORAL
  Filled 2019-06-01 (×4): qty 2

## 2019-06-01 MED ORDER — POLYETHYLENE GLYCOL 3350 17 G PO PACK
17.0000 g | PACK | Freq: Every day | ORAL | Status: DC | PRN
  Administered 2019-06-04 – 2019-06-06 (×3): 17 g via ORAL
  Filled 2019-06-01 (×3): qty 1

## 2019-06-01 MED ORDER — HEMOSTATIC AGENTS (NO CHARGE) OPTIME
TOPICAL | Status: DC | PRN
  Administered 2019-06-01: 1 via TOPICAL

## 2019-06-01 MED ORDER — CHLORHEXIDINE GLUCONATE CLOTH 2 % EX PADS: 6.0000 | MEDICATED_PAD | Freq: Once | CUTANEOUS | Status: DC

## 2019-06-01 MED ORDER — VANCOMYCIN HCL IN DEXTROSE 1-5 GM/200ML-% IV SOLN
1000.0000 mg | INTRAVENOUS | Status: AC
  Administered 2019-06-01: 1000 mg via INTRAVENOUS
  Filled 2019-06-01: qty 200

## 2019-06-01 MED ORDER — ORAL CARE MOUTH RINSE: 15.0000 mL | Freq: Once | OROMUCOSAL | Status: AC

## 2019-06-01 MED ORDER — GLYCOPYRROLATE 0.2 MG/ML IJ SOLN
INTRAMUSCULAR | Status: DC | PRN
  Administered 2019-06-01: .1 mg via INTRAVENOUS

## 2019-06-01 MED ORDER — PROPOFOL 10 MG/ML IV BOLUS
INTRAVENOUS | Status: DC | PRN
  Administered 2019-06-01: 170 mg via INTRAVENOUS

## 2019-06-01 MED ORDER — 0.9 % SODIUM CHLORIDE (POUR BTL) OPTIME
TOPICAL | Status: DC | PRN
  Administered 2019-06-01: 1000 mL

## 2019-06-01 MED ORDER — CYCLOBENZAPRINE HCL 10 MG PO TABS
10.0000 mg | ORAL_TABLET | Freq: Three times a day (TID) | ORAL | Status: DC | PRN
  Administered 2019-06-02: 10 mg via ORAL
  Filled 2019-06-01: qty 1

## 2019-06-01 MED ORDER — DOCUSATE SODIUM 100 MG PO CAPS
100.0000 mg | ORAL_CAPSULE | Freq: Two times a day (BID) | ORAL | Status: DC
  Administered 2019-06-01 – 2019-06-08 (×14): 100 mg via ORAL
  Filled 2019-06-01 (×14): qty 1

## 2019-06-01 SURGICAL SUPPLY — 73 items
BAG DECANTER FOR FLEXI CONT (MISCELLANEOUS) ×3 IMPLANT
BAND RUBBER #18 3X1/16 STRL (MISCELLANEOUS) ×6 IMPLANT
BENZOIN TINCTURE PRP APPL 2/3 (GAUZE/BANDAGES/DRESSINGS) IMPLANT
BLADE CLIPPER SURG (BLADE) IMPLANT
BLADE SURG 11 STRL SS (BLADE) IMPLANT
BUR MATCHSTICK NEURO 3.0 LAGG (BURR) ×3 IMPLANT
BUR PRECISION FLUTE 5.0 (BURR) ×3 IMPLANT
CANISTER SUCT 3000ML PPV (MISCELLANEOUS) ×3 IMPLANT
CLOSURE WOUND 1/2 X4 (GAUZE/BANDAGES/DRESSINGS)
CNTNR URN SCR LID CUP LEK RST (MISCELLANEOUS) ×1 IMPLANT
CONT SPEC 4OZ STRL OR WHT (MISCELLANEOUS) ×2
COVER WAND RF STERILE (DRAPES) IMPLANT
DECANTER SPIKE VIAL GLASS SM (MISCELLANEOUS) ×3 IMPLANT
DERMABOND ADVANCED (GAUZE/BANDAGES/DRESSINGS) ×2
DERMABOND ADVANCED .7 DNX12 (GAUZE/BANDAGES/DRESSINGS) ×1 IMPLANT
DRAPE C-ARM 42X72 X-RAY (DRAPES) ×6 IMPLANT
DRAPE LAPAROTOMY 100X72 PEDS (DRAPES) ×3 IMPLANT
DRAPE MICROSCOPE LEICA (MISCELLANEOUS) ×3 IMPLANT
DRAPE SURG 17X23 STRL (DRAPES) IMPLANT
DURAPREP 26ML APPLICATOR (WOUND CARE) ×3 IMPLANT
ELECT REM PT RETURN 9FT ADLT (ELECTROSURGICAL) ×3
ELECTRODE REM PT RTRN 9FT ADLT (ELECTROSURGICAL) ×1 IMPLANT
FEE INTRAOP MONITOR IMPULS NCS (MISCELLANEOUS) ×1 IMPLANT
GAUZE 4X4 16PLY RFD (DISPOSABLE) IMPLANT
GAUZE SPONGE 4X4 12PLY STRL (GAUZE/BANDAGES/DRESSINGS) IMPLANT
GLOVE BIO SURGEON STRL SZ7.5 (GLOVE) ×9 IMPLANT
GLOVE BIOGEL PI IND STRL 7.5 (GLOVE) ×5 IMPLANT
GLOVE BIOGEL PI INDICATOR 7.5 (GLOVE) ×10
GLOVE EXAM NITRILE LRG STRL (GLOVE) IMPLANT
GLOVE EXAM NITRILE XL STR (GLOVE) IMPLANT
GLOVE EXAM NITRILE XS STR PU (GLOVE) IMPLANT
GLOVE SURG SS PI 7.0 STRL IVOR (GLOVE) ×12 IMPLANT
GOWN STRL REUS W/ TWL LRG LVL3 (GOWN DISPOSABLE) ×1 IMPLANT
GOWN STRL REUS W/ TWL XL LVL3 (GOWN DISPOSABLE) ×3 IMPLANT
GOWN STRL REUS W/TWL 2XL LVL3 (GOWN DISPOSABLE) IMPLANT
GOWN STRL REUS W/TWL LRG LVL3 (GOWN DISPOSABLE) ×2
GOWN STRL REUS W/TWL XL LVL3 (GOWN DISPOSABLE) ×6
HEMOSTAT POWDER KIT SURGIFOAM (HEMOSTASIS) ×3 IMPLANT
INTRAOP MONITOR FEE IMPULS NCS (MISCELLANEOUS) ×1
INTRAOP MONITOR FEE IMPULSE (MISCELLANEOUS) ×2
KIT BASIN OR (CUSTOM PROCEDURE TRAY) ×3 IMPLANT
KIT TURNOVER KIT B (KITS) ×3 IMPLANT
NEEDLE HYPO 18GX1.5 BLUNT FILL (NEEDLE) IMPLANT
NEEDLE HYPO 22GX1.5 SAFETY (NEEDLE) ×3 IMPLANT
NEEDLE SPNL 18GX3.5 QUINCKE PK (NEEDLE) ×3 IMPLANT
NS IRRIG 1000ML POUR BTL (IV SOLUTION) ×3 IMPLANT
PACK BATTERY CMF DISP FOR DVR (ORTHOPEDIC DISPOSABLE SUPPLIES) ×3 IMPLANT
PACK LAMINECTOMY NEURO (CUSTOM PROCEDURE TRAY) ×3 IMPLANT
PAD ARMBOARD 7.5X6 YLW CONV (MISCELLANEOUS) ×9 IMPLANT
PATTIES SURGICAL .25X.25 (GAUZE/BANDAGES/DRESSINGS) ×3 IMPLANT
PIN MAYFIELD SKULL DISP (PIN) ×3 IMPLANT
PLATE DOUBLE Y CMF 6H (Plate) ×12 IMPLANT
SCREW UNIII AXS SD 1.5X4 (Screw) ×48 IMPLANT
SEALANT ADHERUS EXTEND TIP (MISCELLANEOUS) ×3 IMPLANT
SET CARTRIDGE AND TUBING (SET/KITS/TRAYS/PACK) IMPLANT
SPONGE LAP 4X18 RFD (DISPOSABLE) IMPLANT
SPONGE SURGIFOAM ABS GEL 100 (HEMOSTASIS) ×3 IMPLANT
STRIP CLOSURE SKIN 1/2X4 (GAUZE/BANDAGES/DRESSINGS) IMPLANT
SUT MNCRL AB 3-0 PS2 18 (SUTURE) ×3 IMPLANT
SUT PROLENE 6 0 BV (SUTURE) ×3 IMPLANT
SUT VIC AB 0 CT1 18XCR BRD8 (SUTURE) ×1 IMPLANT
SUT VIC AB 0 CT1 8-18 (SUTURE) ×2
SUT VIC AB 2-0 CP2 18 (SUTURE) ×6 IMPLANT
SYR 3ML LL SCALE MARK (SYRINGE) IMPLANT
TIP NONSTICK .5MMX23CM (INSTRUMENTS) ×2
TIP NONSTICK .5X23 (INSTRUMENTS) ×1 IMPLANT
TIP STANDARD 36KHZ (INSTRUMENTS)
TIP STD 36KHZ (INSTRUMENTS) IMPLANT
TOWEL GREEN STERILE (TOWEL DISPOSABLE) ×3 IMPLANT
TOWEL GREEN STERILE FF (TOWEL DISPOSABLE) ×3 IMPLANT
TRAY FOLEY MTR SLVR 16FR STAT (SET/KITS/TRAYS/PACK) ×3 IMPLANT
WATER STERILE IRR 1000ML POUR (IV SOLUTION) ×3 IMPLANT
WRENCH TORQUE 36KHZ (INSTRUMENTS) IMPLANT

## 2019-06-01 NOTE — Progress Notes (Signed)
Patient to MRI after medicated with 1mg  Hydromorphone IV.

## 2019-06-01 NOTE — Anesthesia Preprocedure Evaluation (Signed)
Anesthesia Evaluation  Patient identified by MRN, date of birth, ID band Patient awake    Reviewed: Allergy & Precautions, NPO status , Patient's Chart, lab work & pertinent test results  Airway Mallampati: I  TM Distance: >3 FB Neck ROM: Full    Dental   Pulmonary former smoker,    Pulmonary exam normal        Cardiovascular Normal cardiovascular exam     Neuro/Psych Anxiety Depression    GI/Hepatic   Endo/Other    Renal/GU      Musculoskeletal   Abdominal   Peds  Hematology   Anesthesia Other Findings   Reproductive/Obstetrics                             Anesthesia Physical Anesthesia Plan  ASA: II  Anesthesia Plan: General   Post-op Pain Management:    Induction: Intravenous  PONV Risk Score and Plan: 2  Airway Management Planned: Oral ETT  Additional Equipment:   Intra-op Plan:   Post-operative Plan: Extubation in OR  Informed Consent: I have reviewed the patients History and Physical, chart, labs and discussed the procedure including the risks, benefits and alternatives for the proposed anesthesia with the patient or authorized representative who has indicated his/her understanding and acceptance.       Plan Discussed with: CRNA and Surgeon  Anesthesia Plan Comments:         Anesthesia Quick Evaluation

## 2019-06-01 NOTE — Progress Notes (Signed)
Pharmacy Antibiotic Note  Jacob Lambert is a 34 y.o. adult admitted on 06/01/2019 for open biopsy and possible resection of intramedullary cervical spinal cord tumor; pt is S/P C6-7 laminoplasty, biopsy and resection of the spinal cord tumor this afternoon.  Pharmacy has been consulted for vancomycin dosing for 2 post-op doses.  Pt rec'd vancomycin 1 gm IV X 1 pre op at 0658 AM today.   WBC 11.3, afebrile, Scr 1.19, CrCl 92.5 ml/min  Plan: Vancomycin 1 gram IV Q 12 hrs X 2 doses, starting now (1900)  Height: 5' 10.5" (179.1 cm) Weight: 74.1 kg (163 lb 4.8 oz) IBW/kg (Calculated) : 74.15  Temp (24hrs), Avg:98.8 F (37.1 C), Min:98.3 F (36.8 C), Max:99.5 F (37.5 C)  Recent Labs  Lab 05/30/19 1016 06/01/19 0603  WBC 11.3*  --   CREATININE  --  1.19    Estimated Creatinine Clearance (by C-G formula based on SCr of 1.19 mg/dL) Male: 74 mL/min Male: 92.5 mL/min    Allergies  Allergen Reactions  . Cephalosporins Hives  . Other     [place] toothfish    Microbiology results: 5/25 MRSA PCR: negative 5/25 COVID: negative  Thank you for allowing pharmacy to be a part of this patient's care.  Gillermina Hu, PharmD, BCPS, [facility] Clinical Pharmacist 06/01/2019 6:43 PM

## 2019-06-01 NOTE — Op Note (Signed)
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 MRI showed a large intramedullary heterogenous mass with an enhancing portion at C6-7. This was discussed with the patient as well as risks, benefits, and alternatives and wished to proceed with surgery.   OPERATIVE DETAIL: The patient was taken to the operating room and anesthesia was induced by the anesthesia team. He was then placed on the OR table in the prone position with a [place] head holder. A formal time out was performed with two patient identifiers and confirmed the operative site. The operative site was marked, hair was clipped with surgical clippers, the area was then prepped and draped in a sterile fashion.   Fluoroscopy was used to guide the incision and trajectory. A midline incision was placed in the midline in the posterior cervical region to expose C6 and C7 with a portion of caudal C5 and cranial T1. Cranial titanium plates were placed over the C6 and C7 lamina to pre-drill holes, then removed. C6 and C7 lamina were then removed en bloc with a high speed drill and rongeurs. These were set aside and the dura was opened in the midline. The cord had obvious color change consistent with hemosiderin. The microscope was draped and brought into the field. The posterior median sulcus was identified and a myelotomy was performed with identification of the tumor. A piece was sent for frozen and the tumor was further dissected.  There was a very good plane posteriorly and laterally, but this disappeared ventrally. Prelim path was lesional tissue without a clear diagnosis, so maximal safe resection was attempted. Dr. Marcello Moores scrubbed in to help with dissection and dynamic retraction. SSEPs and q65min MEPs were performed during the entirety of resection. We mobilized the tumor as best as possible with the poor plane ventrally and resected it circumferentially until it appeared to taper cranially/caudally suggesting the end of the mass. MEPs and SSEPs were stable except for the LLE, which was actually improved as the tumor was removed.   The mass was sent for path, hemostasis was confirmed, and 6-0 prolene was used under the microscope to close the durotomy. The wound was copiously irrigated then the suture line was dried with cottonoids prior to placing Fibrin glue (Adheris) along the incision line without any leakage visible. The lamina were replaced and titanium plates and screws were used to secure it into place to complete the laminoplasty.   All instrument and sponge counts were correct, the incision was then closed in layers. The patient was then returned to anesthesia for emergence. No apparent complications at the completion of the procedure.   EBL:  448mL   DRAINS: none   SPECIMENS: Cervical spinal cord tumor   [name], MD 06/01/19 2:30 PM

## 2019-06-01 NOTE — Transfer of Care (Signed)
Immediate Anesthesia Transfer of Care Note  Patient: Jacob Lambert  Procedure(s) Performed: Cervical six-seven Laminoplasty, biopsy and resection of intramedullary spinal cord tumor (N/A Neck)  Patient Location: PACU  Anesthesia Type:General  Level of Consciousness: awake and patient cooperative  Airway & Oxygen Therapy: Patient Spontanous Breathing  Post-op Assessment: Report given to RN and Post -op Vital signs reviewed and stable  Post vital signs: Reviewed and stable  Last Vitals:  Vitals Value Taken Time  BP 112/78 06/01/19 1436  Temp 37.5 C 06/01/19 1306  Pulse 62 06/01/19 1451  Resp 18 06/01/19 1451  SpO2 97 % 06/01/19 1451  Vitals shown include unvalidated device data.  Last Pain:  Vitals:   06/01/19 1336  TempSrc:   PainSc: 6          Complications: No apparent anesthesia complications

## 2019-06-01 NOTE — H&P (Signed)
Surgical H&P Update  HPI: 34 y.o. man with progressive ataxia / leg weakness, sexual dysfunction, then progressed to BUE clumsiness, found to have intramedullary cervical lesion with associated cyst, here for open biopsy and possible resection. No changes in health since they were last seen.   PMHx:  Past Medical History:  Diagnosis Date  . Anxiety   . Depression   . Headache   . Pneumonia    "walking"   FamHx: History reviewed. No pertinent family history. SocHx:  reports that Jacob Lambert has quit smoking. Jacob Lambert has never used smokeless tobacco. Jacob Lambert reports current alcohol use. Jacob Lambert reports previous drug use.  Physical Exam: Myelopathic with sustained clonus and BLE dec'd sensation  Assesment/Plan: 34 y.o. with intramedullary cervical tumor, here for open biopsy and possible resection. Risks, benefits, and alternatives discussed and the patient would like to continue with surgery. I explained that I would make a smaller exposure and obtain tissue sample and evaluate for possible resection. If appropriate, will extend the exposure to span multiple adjacent levels to perform tumor resection, if appropriate.   -OR today -4N post-op  Jacob Part, MD 06/01/19 6:52 AM

## 2019-06-01 NOTE — Progress Notes (Signed)
Pt arrived to 4NP13 from PACU. Pt A&OX4.

## 2019-06-01 NOTE — Anesthesia Postprocedure Evaluation (Signed)
Anesthesia Post Note  Patient: Jacob Lambert  Procedure(s) Performed: Cervical six-seven Laminoplasty, biopsy and resection of intramedullary spinal cord tumor (N/A Neck)     Patient location during evaluation: PACU Anesthesia Type: General Level of consciousness: awake and alert Pain management: pain level controlled Vital Signs Assessment: post-procedure vital signs reviewed and stable Respiratory status: spontaneous breathing, nonlabored ventilation, respiratory function stable and patient connected to nasal cannula oxygen Cardiovascular status: blood pressure returned to baseline and stable Postop Assessment: no apparent nausea or vomiting Anesthetic complications: no    Last Vitals:  Vitals:   06/01/19 1336 06/01/19 1351  BP: 112/65 118/69  Pulse: (!) 102 93  Resp: 10 11  Temp:    SpO2: 96% 99%    Last Pain:  Vitals:   06/01/19 1336  TempSrc:   PainSc: 6                  [name] DAVID

## 2019-06-01 NOTE — Anesthesia Procedure Notes (Signed)
Procedure Name: Intubation Date/Time: 06/01/2019 7:47 AM Performed by: Lance Coon, CRNA Pre-anesthesia Checklist: Patient identified, Emergency Drugs available, Suction available, Patient being monitored and Timeout performed Patient Re-evaluated:Patient Re-evaluated prior to induction Oxygen Delivery Method: Circle system utilized Preoxygenation: Pre-oxygenation with 100% oxygen Induction Type: IV induction Ventilation: Mask ventilation without difficulty Laryngoscope Size: Miller and 3 Grade View: Grade I Tube type: Oral Tube size: 7.5 mm Number of attempts: 1 Airway Equipment and Method: Stylet Placement Confirmation: ETT inserted through vocal cords under direct vision,  positive ETCO2 and breath sounds checked- equal and bilateral Secured at: 22 cm Tube secured with: Tape Dental Injury: Teeth and Oropharynx as per pre-operative assessment

## 2019-06-01 NOTE — Brief Op Note (Signed)
06/01/2019  2:29 PM  PATIENT:  Jacob Lambert  34 y.o. adult  PRE-OPERATIVE DIAGNOSIS:  Spinal cord tumor  POST-OPERATIVE DIAGNOSIS:  Spinal cord tumor  PROCEDURE:  Procedure(s): Cervical six-seven Laminoplasty, biopsy and resection of intramedullary spinal cord tumor (N/A)  SURGEON:  Surgeon(s) and Role:    * [name], MD - Primary    * Vallarie Mare, MD - Assisting  PHYSICIAN ASSISTANT:   ANESTHESIA:   general  EBL:  450 mL   BLOOD ADMINISTERED:none  DRAINS: none   LOCAL MEDICATIONS USED:  LIDOCAINE   SPECIMEN:  Source of Specimen:  Cervical spinal cord tumor  DISPOSITION OF SPECIMEN:  PATHOLOGY  COUNTS:  YES  TOURNIQUET:  * No tourniquets in log *  DICTATION: .Note written in EPIC  PLAN OF CARE: Admit to inpatient   PATIENT DISPOSITION:  PACU - hemodynamically stable.   Delay start of Pharmacological VTE agent (>24hrs) due to surgical blood loss or risk of bleeding: yes

## 2019-06-02 ENCOUNTER — Encounter: Payer: Self-pay | Admitting: *Deleted

## 2019-06-02 MED ORDER — DIAZEPAM 5 MG PO TABS
10.0000 mg | ORAL_TABLET | Freq: Four times a day (QID) | ORAL | Status: DC | PRN
  Administered 2019-06-02 – 2019-06-08 (×16): 10 mg via ORAL
  Filled 2019-06-02 (×17): qty 2

## 2019-06-02 MED ORDER — GABAPENTIN 300 MG PO CAPS
300.0000 mg | ORAL_CAPSULE | Freq: Three times a day (TID) | ORAL | Status: DC
  Administered 2019-06-02 – 2019-06-03 (×6): 300 mg via ORAL
  Filled 2019-06-02 (×6): qty 1

## 2019-06-02 NOTE — Evaluation (Signed)
Occupational Therapy Evaluation Patient Details Name: Jacob Lambert MRN: [id number] DOB: 05-29-1985 Today's Date: 06/02/2019    History of Present Illness Patient is a 34 y/o male who presents with a cervical lesion and cyst s/pC6-7 laminoplasty, biopsy and resection of spinal cord tumor 5/27. PMH includes depression, anxiety, HA.   Clinical Impression   This 34 y/o male presents with the above. PTA pt reports using SPC for mobility, reports increased effort with ADL tasks as of recent but was able to perform without assist. Pt currently with limitations including weakness, impaired fine motor coordination/strength, and significant pain with any mobility. Pt currently requires two person assist for safe completion of bed mobility - pt tolerating sitting EOB but only for brief period of time. Pt with significant posterior lean/posterior pelvic tilt and is unable to achieve full upright sitting at EOB due to pain. Pt also limited due to drop in BP with transitions - BP start of session 117/70, with sitting EOB BP dropped to 70/57, returned to supine/repositioned and BP 107/70. Encouraged pt to progress elevating HOB throughout today to improve tolerance to upright. Pt will benefit from continued acute OT services and currently recommend CIR level therapies at time of discharge to progress pt towards PLOF. Will follow.     Follow Up Recommendations  CIR    Equipment Recommendations  Other (comment)(TBD)    Recommendations for Other Services Rehab consult     Precautions / Restrictions Precautions Precautions: Fall Precaution Booklet Issued: No Precaution Comments: watch BP Restrictions Weight Bearing Restrictions: No      Mobility Bed Mobility Overal bed mobility: Needs Assistance Bed Mobility: Rolling;Sidelying to Sit;Sit to Sidelying Rolling: Max assist;+2 for physical assistance Sidelying to sit: Max assist;+2 for physical assistance;HOB elevated     Sit to sidelying: Max  assist;+2 for physical assistance General bed mobility comments: HOB slightly elevated; cues forl og roll technique, assist with rolling towards right with pad and to elevate trunk. Increased time/effort. very pain limiting. Max A of 2 to return to sidelying and supine after BP drop.  Transfers                 General transfer comment: Deferred as pt unable at this time, high pain, low BP.    Balance Overall balance assessment: Needs assistance;History of Falls Sitting-balance support: Feet supported;Bilateral upper extremity supported Sitting balance-Leahy Scale: Poor Sitting balance - Comments: Pt with difficulty maintaining upright trunk (not able to acheive full upright sitting today) despite UE support on RW; heavy posterior lean and posterior pelvic tilt Postural control: Posterior lean                                 ADL either performed or assessed with clinical judgement   ADL Overall ADL's : Needs assistance/impaired Eating/Feeding: Set up Eating/Feeding Details (indicate cue type and reason): requires assist to setup breakfast tray end of session including opening containers Grooming: Moderate assistance;Bed level                                 General ADL Comments: pt otherwise requiring maxA up to [place] for ADL tasks given significant pain with any mobility/functional task     Vision Baseline 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" )       Perception     Praxis      Pertinent Vitals/Pain  Pain Assessment: Faces Faces Pain Scale: Hurts worst Pain Location: "everywhere but my right arm" Pain Descriptors / Indicators: Burning;Tingling;Tender;Grimacing;Guarding;Numbness;Operative site guarding;Pins and needles;Sore Pain Intervention(s): Limited activity within patient's tolerance;Monitored during session;Repositioned;Patient requesting pain meds-RN notified;Utilized relaxation techniques     Hand Dominance Left    Extremity/Trunk Assessment Upper Extremity Assessment Upper Extremity Assessment: LUE deficits/detail;Generalized weakness LUE Deficits / Details: limited due to pain/tingling/burning; pt endorses numbness and tingling in digits/hand/wrist, decreased fine motor/coordination LUE: Unable to fully assess due to pain LUE Sensation: decreased light touch LUE Coordination: decreased fine motor;decreased gross motor   Lower Extremity Assessment Lower Extremity Assessment: Defer to PT evaluation RLE Deficits / Details: minimal movement, limited mainly by pain. Able to wiggle toes, No active ankle motion. Highly sensitive to light touch, pins/needles/burning sensation. Good quad activation. Limited knee flexion AROM, able to get to ~80 degrees once EOB passively. RLE Sensation: decreased light touch(inconsistent decreased light touch sensation patches throughout LEs) LLE Deficits / Details: minimal movement, limited mainly by pain. Able to wiggle toes, minimal active ankle motion. Highly sensitive to light touch, pins/needles/burning sensation. Noted to have some adductor and minimal hamstring activation. Good quad activation. Limited knee flexion AROM, able to get to ~75 degrees once sitting EOB passively. LLE Sensation: decreased light touch;decreased proprioception(inconsistent decreased light touch sensation patches throughout LEs)   Cervical / Trunk Assessment Cervical / Trunk Assessment: Other exceptions Cervical / Trunk Exceptions: s/p spine surgery; limited ability to support cervical spine and maintain upright trunk, heavy posterior lean into posterior pelvic tilt when sitting EOB.   Communication Communication Communication: No difficulties   Cognition Arousal/Alertness: Awake/alert Behavior During Therapy: Anxious Overall Cognitive Status: Within Functional Limits for tasks assessed                                 General Comments: Pt distracted by pain; upset with events  of last night. 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   General Comments       Exercises     Shoulder Instructions      Home Living Family/patient expects to be discharged to:: Private residence Living Arrangements: Parent Available Help at Discharge: Family;Available PRN/intermittently Type of Home: House Home Access: Stairs to enter [employer] of Steps: 2 from garage Entrance Stairs-Rails: (sort of handrail) Home Layout: One level;Laundry or work area in basement(20 stairs to get to basement.)     Bathroom Shower/Tub: Teacher, early years/pre: Standard     Home Equipment: [employer] - single point;Other (comment)   Additional Comments: tens machine      Prior Functioning/Environment Level of Independence: Independent with assistive device(s)        Comments: Using SPC. Slipped in shower twice; able to dress self but took longer. Difficulty with shoes/socks. Doing some cleaning/cooking.        OT Problem List: Decreased strength;Decreased range of motion;Decreased activity tolerance;Impaired balance (sitting and/or standing);Decreased coordination;Decreased cognition;Impaired vision/perception;Decreased safety awareness;Decreased knowledge of use of DME or AE;Decreased knowledge of precautions;Pain;Impaired sensation;Impaired UE functional use      OT Treatment/Interventions: Self-care/ADL training;Therapeutic exercise;Neuromuscular education;Energy conservation;DME and/or AE instruction;Therapeutic activities;Visual/perceptual remediation/compensation;Patient/family education;Balance training    OT Goals(Current goals can be found in the care plan section) Acute Rehab OT Goals Patient Stated Goal: less pain, return to PLOF OT Goal Formulation: With patient Time For Goal Achievement: 06/16/19 Potential to Achieve Goals: Fair  OT Frequency: Min 2X/week   Barriers to D/C:  Co-evaluation PT/OT/SLP Co-Evaluation/Treatment: Yes Reason  for Co-Treatment: Complexity of the patient's impairments (multi-system involvement);For patient/therapist safety;To address functional/ADL transfers PT goals addressed during session: Mobility/safety with mobility;Balance;Strengthening/ROM OT goals addressed during session: Strengthening/ROM;ADL's and self-care      AM-PAC OT "6 Clicks" Daily Activity     Outcome Measure Help from another person eating meals?: A Little Help from another person taking care of personal grooming?: A Lot Help from another person toileting, which includes using toliet, bedpan, or urinal?: Total Help from another person bathing (including washing, rinsing, drying)?: Total Help from another person to put on and taking off regular upper body clothing?: Total Help from another person to put on and taking off regular lower body clothing?: Total 6 Click Score: 9   End of Session Nurse Communication: Mobility status;Patient requests pain meds  Activity Tolerance: Patient limited by pain Patient left: in bed;with call bell/phone within reach;with bed alarm set  OT Visit Diagnosis: Other abnormalities of gait and mobility (R26.89);Muscle weakness (generalized) (M62.81);Pain Pain - part of body: ("all over")                Time: [id number] OT Time Calculation (min): 39 min Charges:  OT General Charges $OT Visit: 1 Visit OT Evaluation $OT Eval Moderate Complexity: [place], OT Acute Rehabilitation Services Pager [phone] Office [phone]   Raymondo Band 06/02/2019, 11:42 AM

## 2019-06-02 NOTE — Progress Notes (Signed)
Patient states " when they moved me from the stretcher to the be it feels like I pull my left groin." When RN tried to assess patient's ability to move his RLE he yelled so RN stopped assessment. Patient had good capillary return and pulses. Patient was in MRI for a while and when he returned was in pain again  So we medicated him and he has since began to get some relief with the pain scale from a 9 to pain scale of a 7 now moving toes.  Patient continues to move upper extremities with a sight deficit on the left.Patient has requested to leave the foley catheter in place until he see the Dr this am.

## 2019-06-02 NOTE — Evaluation (Signed)
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 PMH includes depression, anxiety, HA.  Clinical Impression  Patient presents with pain, decreased AROM/strength all extremities, impaired balance, decreased vision (premorbid Right eye blindness), decreased activity tolerance and impaired mobility s/p above. Pt lives in basement of parent's house and was using [facility] for ambulation PTA. Today, PT/OT evaluation limited due to pain (burning, tender, pins/needles, shooting) throughout entire body worsened with light touch or movement. Requires assist of 2 for bed mobility utilizing log roll technique but difficulty sitting EOB without support due to pain/weakness throughout trunk/neck. Sitting tolerance also limited due to drop in BP from 117/70 to 70/57, symptomatic and dizzy. Would benefit from CIR to maximize independence and mobility prior to return home. Will follow acutely.    Follow Up Recommendations CIR;Supervision for mobility/OOB;Supervision/Assistance - 24 hour    Equipment Recommendations  Other (comment)(defer to next venue)    Recommendations for Other Services Rehab consult     Precautions / Restrictions Precautions Precautions: Fall Precaution Booklet Issued: No Precaution Comments: watch BP Restrictions Weight Bearing Restrictions: No      Mobility  Bed Mobility Overal bed mobility: Needs Assistance Bed Mobility: Rolling;Sidelying to Sit;Sit to Sidelying Rolling: Max assist;+2 for physical assistance Sidelying to sit: Max assist;+2 for physical assistance;HOB elevated     Sit to sidelying: Max assist;+2 for physical assistance General bed mobility comments: HOB slightly elevated; cues forl og roll technique, assist with rolling  towards right with pad and to elevate trunk. Increased time/effort. very pain limiting. Max A of 2 to return to sidelying and supine after BP drop.  Transfers                 General transfer comment: Deferred as pt unable at this time, high pain, low BP.  Ambulation/Gait                Stairs            Wheelchair Mobility    Modified Rankin (Stroke Patients Only)       Balance Overall balance assessment: Needs assistance;History of Falls Sitting-balance support: Feet supported;Bilateral upper extremity supported Sitting balance-Leahy Scale: Poor Sitting balance - Comments: Pt with difficulty maintaining upright trunk (not able to acheive full upright sitting today) despite UE support on RW; heavy posterior lean and posterior pelvic tilt Postural control: Posterior lean                                   Pertinent Vitals/Pain Pain Assessment: Faces Faces Pain Scale: Hurts worst Pain Location: "everywhere but my right arm" Pain Descriptors / Indicators: Burning;Tingling;Tender;Grimacing;Guarding;Numbness;Operative site guarding;Pins and needles;Sore Pain Intervention(s): Monitored during session;Repositioned;Limited activity within patient's tolerance;Patient requesting pain meds-RN notified;Utilized relaxation techniques    Home Living Family/patient expects to be discharged to:: Private residence Living Arrangements: Parent Available Help at Discharge: Family;Available PRN/intermittently(Dad is MD at [employer] and mom is a nurse) Type of Home: House Home Access: Stairs to enter Entrance Stairs-Rails: (sort of handrail) Entrance Stairs-Number of Steps: 2 from garage Home Layout: One level;Laundry or work area in basement(20 stairs to get to basement but able to live on main level at d/c) Home Equipment: Other (comment) Additional Comments: tens machine  Prior Function Level of Independence: Independent with assistive device(s)          Comments: Using SPC. Slipped in shower twice; able to dress self but took longer. Difficulty with shoes/socks. Doing some cleaning/cooking.     Hand Dominance   Dominant Hand: Left    Extremity/Trunk Assessment   Upper Extremity Assessment Upper Extremity Assessment: Defer to OT evaluation LUE Deficits / Details: limited due to pain/tingling/burning; pt endorses numbness and tingling in digits/hand/wrist, decreased fine motor/coordination LUE: Unable to fully assess due to pain LUE Sensation: decreased light touch LUE Coordination: decreased fine motor;decreased gross motor    Lower Extremity Assessment Lower Extremity Assessment: LLE deficits/detail;RLE deficits/detail;Generalized weakness RLE Deficits / Details: minimal movement, limited mainly by pain. Able to wiggle toes, No active ankle motion. Highly sensitive to light touch, pins/needles/burning sensation. Good quad activation. Limited knee flexion AROM, able to get to ~80 degrees once EOB passively. RLE Sensation: decreased light touch LLE Deficits / Details: minimal movement, limited mainly by pain. Able to wiggle toes, minimal active ankle motion. Highly sensitive to light touch, pins/needles/burning sensation. Noted to have some adductor and minimal hamstring activation. Good quad activation. Limited knee flexion AROM, able to get to ~75 degrees once sitting EOB passively. LLE Sensation: decreased light touch;decreased proprioception    Cervical / Trunk Assessment Cervical / Trunk Assessment: Other exceptions Cervical / Trunk Exceptions: s/p spine surgery; limited ability to support cervical spine and maintain upright trunk, heavy posterior lean into posterior pelvic tilt when sitting EOB.  Communication   Communication: No difficulties  Cognition Arousal/Alertness: Awake/alert Behavior During Therapy: Anxious Overall Cognitive Status: Within Functional Limits for tasks assessed                                  General Comments: Pt distracted by pain; upset with events of last night. 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      General Comments General comments (skin integrity, edema, etc.): Supine BP 117/70, sitting BP 70/57, supine BP 107/70, dizzy and symptomatic.    Exercises     Assessment/Plan    PT Assessment Patient needs continued PT services  PT Problem List Decreased strength;Decreased mobility;Decreased range of motion;Decreased coordination;Decreased activity tolerance;Decreased skin integrity;Pain;Impaired sensation;Decreased balance;Decreased knowledge of use of DME;Cardiopulmonary status limiting activity       PT Treatment Interventions Therapeutic activities;DME instruction;Gait training;Therapeutic exercise;Patient/family education;Wheelchair mobility training;Balance training;Functional mobility training;Neuromuscular re-education;Manual Water quality scientist    PT Goals (Current goals can be found in the Care Plan section)  Acute Rehab PT Goals Patient Stated Goal: less pain, return to PLOF PT Goal Formulation: With patient Time For Goal Achievement: 06/16/19 Potential to Achieve Goals: Fair    Frequency Min 5X/week   Barriers to discharge Decreased caregiver support;Inaccessible home environment parents work; stairs to enter home    Co-evaluation PT/OT/SLP Co-Evaluation/Treatment: Yes Reason for Co-Treatment: Complexity of the patient's impairments (multi-system involvement);For patient/therapist safety;To address functional/ADL transfers PT goals addressed during session: Mobility/safety with mobility;Balance;Strengthening/ROM OT goals addressed during session: Strengthening/ROM;ADL's and self-care       AM-PAC PT "6 Clicks" Mobility  Outcome Measure Help needed turning from your back to your side while in a flat bed without using bedrails?: Total Help needed moving from lying on your back to sitting on the side of a flat bed without using  bedrails?: Total Help needed moving to and from a bed to a chair (including a wheelchair)?: Total Help  needed standing up from a chair using your arms (e.g., wheelchair or bedside chair)?: Total Help needed to walk in hospital room?: Total Help needed climbing 3-5 steps with a railing? : Total 6 Click Score: 6    End of Session   Activity Tolerance: Patient limited by pain Patient left: in bed;with call bell/phone within reach;with bed alarm set;with SCD's reapplied Nurse Communication: Mobility status;Need for lift equipment;Patient requests pain meds PT Visit Diagnosis: Pain;Muscle weakness (generalized) (M62.81);Dizziness and giddiness (R42) Pain - part of body: ("everywhere")    Time: 0900-0940 PT Time Calculation (min) (ACUTE ONLY): 40 min   Charges:   PT Evaluation $PT Eval Moderate Complexity: 1 Mod PT Treatments $Therapeutic Activity: 8-22 mins        Marisa Severin, PT, DPT Acute Rehabilitation Services Pager [phone] Office [phone]      Marguarite Arbour A Sabra Heck 06/02/2019, 12:27 PM

## 2019-06-02 NOTE — Progress Notes (Signed)
Neurosurgery Service Progress Note  Subjective: No acute events overnight, had and is having dysesthetic pain that's limiting strength, legs feel like they have patchy numbness and more clumsy, gets severe neuropathic pain with hip movement that travels up his entire body, +neck stiffness, R hand feels better than preop, L hand feels more clumsy with worsened numbness   Objective: Vitals:   06/01/19 1850 06/02/19 0000 06/02/19 0353 06/02/19 0742  BP: 125/63 114/73 109/72 114/66  Pulse: 68 84 92 86  Resp: 18   18  Temp: 99.7 F (37.6 C) 100.1 F (37.8 C) (!) 100.6 F (38.1 C) 100.2 F (37.9 C)  TempSrc: Oral Oral Oral Oral  SpO2: 98% 96% 99% 98%  Weight:      Height:       Temp (24hrs), Avg:99.8 F (37.7 C), Min:98.7 F (37.1 C), Max:100.6 F (38.1 C)  CBC Latest Ref Rng & Units 05/30/2019  WBC 4.0 - 10.5 K/uL 11.3(H)  Hemoglobin 13.0 - 17.0 g/dL 14.0  Hematocrit 39.0 - 52.0 % 43.2  Platelets 150 - 400 K/uL 259   BMP Latest Ref Rng & Units 06/01/2019  Glucose 70 - 99 mg/dL 89  BUN 6 - 20 mg/dL 17  Creatinine 0.61 - 1.24 mg/dL 1.19  Sodium 135 - 145 mmol/L 137  Potassium 3.5 - 5.1 mmol/L 4.4  Chloride 98 - 111 mmol/L 102  CO2 22 - 32 mmol/L 22  Calcium 8.9 - 10.3 mg/dL 9.6    Intake/Output Summary (Last 24 hours) at 06/02/2019 0845 Last data filed at 06/02/2019 0400 Gross per 24 hour  Intake 2000 ml  Output 3150 ml  Net -1150 ml    Current Facility-Administered Medications:  .  0.9 %  sodium chloride infusion, 250 mL, Intravenous, Continuous, Rena Hunke, Joyice Faster, MD, Stopped at 06/01/19 1930 .  acetaminophen (TYLENOL) tablet 650 mg, 650 mg, Oral, Q4H PRN **OR** acetaminophen (TYLENOL) suppository 650 mg, 650 mg, Rectal, Q4H PRN, Monterrio Gerst, Joyice Faster, MD .  Chlorhexidine Gluconate Cloth 2 % PADS 6 each, 6 each, Topical, Daily, Jahaira Earnhart A, MD .  diazepam (VALIUM) tablet 10 mg, 10 mg, Oral, Q6H PRN, [name], MD .  docusate sodium (COLACE) capsule 100  mg, 100 mg, Oral, BID, [name], MD, 100 mg at 06/01/19 2345 .  gabapentin (NEURONTIN) capsule 300 mg, 300 mg, Oral, TID, Tamekia Rotter A, MD .  HYDROmorphone (DILAUDID) injection 1 mg, 1 mg, Intravenous, Q3H PRN, [name], MD, 1 mg at 06/02/19 [id number] .  lactated ringers infusion, , Intravenous, Continuous, Lillia Abed, MD, Last Rate: 10 mL/hr at 06/01/19 2324, New Bag at 06/01/19 2324 .  menthol-cetylpyridinium (CEPACOL) lozenge 3 mg, 1 lozenge, Oral, PRN **OR** phenol (CHLORASEPTIC) mouth spray 1 spray, 1 spray, Mouth/Throat, PRN, Avedis Bevis A, MD .  ondansetron (ZOFRAN) tablet 4 mg, 4 mg, Oral, Q6H PRN **OR** ondansetron (ZOFRAN) injection 4 mg, 4 mg, Intravenous, Q6H PRN, [name], MD, 4 mg at 06/02/19 0345 .  oxyCODONE (Oxy IR/ROXICODONE) immediate release tablet 10 mg, 10 mg, Oral, Q4H PRN, [name], MD, 10 mg at 06/02/19 0036 .  oxyCODONE (Oxy IR/ROXICODONE) immediate release tablet 5 mg, 5 mg, Oral, Q4H PRN, Journi Moffa A, MD .  polyethylene glycol (MIRALAX / GLYCOLAX) packet 17 g, 17 g, Oral, Daily PRN, Addison Freimuth A, MD .  sodium chloride flush (NS) 0.9 % injection 3 mL, 3 mL, Intravenous, Q12H, Babatunde Seago, Joyice Faster, MD, 3 mL at 06/02/19 0204 .  sodium chloride  flush (NS) 0.9 % injection 3 mL, 3 mL, Intravenous, PRN, [name], MD   Physical Exam: Awake/alert, FCx4 with L C8 numbness and L C7 dysesthetic pain, lower extremity exam limited by pain (especially proximally) but distally his ankle/toe function is 4-/5  Assessment & Plan: 34 y.o. s/p resection of cervical intramedullary tumor, post-op MRI with gross total resection of enhancing portion.  -flexeril --> diazepam for muscle spasms -start gabapentin 300tid for dysesthetic pain -PT/OT as tolerated -SCDs/TEDs, SQH on POD2 -d/c foley -final path pending  [name]  06/02/19 8:45 AM

## 2019-06-02 NOTE — Progress Notes (Signed)
Rehab Admissions Coordinator Note:  Per PT and OT recommendation, this patient was screened by Raechel Ache for appropriateness for an Inpatient Acute Rehab Consult.  At this time, it appears pt will need IP Rehab. Will follow along over weekend for greater tolerance with therapies prior to requesting consult order.   Raechel Ache 06/02/2019, 12:59 PM  I can be reached at [phone].

## 2019-06-03 NOTE — Progress Notes (Addendum)
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 PMH includes depression, anxiety, HA.    PT Comments    Pt remains greatly limited by pain and becoming orthostatic at EOB. Pt did increased EOB sitting tolerance with decreased assistance compared to previous PT session. Attempted to have pt sit up in bed in chair position however pt unable to tolerate 29 deg, had to decrease it to 24 deg.  Pt remains to require maxA for all mobility.   Follow Up Recommendations  CIR;Supervision for mobility/OOB;Supervision/Assistance - 24 hour     Equipment Recommendations       Recommendations for Other Services Rehab consult     Precautions / Restrictions Precautions Precautions: Fall Precaution Comments: watch BP, orthostatic Restrictions Weight Bearing Restrictions: No    Mobility  Bed Mobility Overal bed mobility: Needs Assistance Bed Mobility: Rolling;Sidelying to Sit;Sit to Sidelying Rolling: Max assist;+2 for physical assistance Sidelying to sit: Max assist;+2 for physical assistance;HOB elevated     Sit to sidelying: Max assist;+2 for physical assistance General bed mobility comments: HOB elevated, max encouragement to participate, maxA for trunk elevation and LE mangement  Transfers                 General transfer comment: unable to tolerate  Ambulation/Gait             General Gait Details: unable   Stairs             Wheelchair Mobility    Modified Rankin (Stroke Patients Only)       Balance Overall balance assessment: Needs assistance;History of Falls Sitting-balance support: Feet supported;Bilateral upper extremity supported Sitting balance-Leahy Scale: Poor Sitting balance - Comments: pt limited by pain  however was able to maintain EOB balance with close min guard and did not have a posterior lean like previous session. Pt tolerated about 5 min, pt stated lightheadedness. BP dropped from 118/81 to 86/55, RN aware. Greatly limited by pain                                    Cognition Arousal/Alertness: Awake/alert Behavior During Therapy: Anxious Overall Cognitive Status: Within Functional Limits for tasks assessed                                 General Comments: Pt with increased anxiety as pain increases.      Exercises Other Exercises Other Exercises: prom to bilat LEs, pt reports pain from stiffness stand point. Pt began quad sets as well    General Comments General comments (skin integrity, edema, etc.): supine BP 118/81, sitting BP 86/55, supine BP after return from sitting 107/80      Pertinent Vitals/Pain Pain Assessment: 0-10 Pain Score: 10-Worst pain ever Pain Location: "everywhere", especially L side of neck Pain Descriptors / Indicators: Burning;Tingling;Tender;Grimacing;Guarding;Numbness;Operative site guarding;Pins and needles;Sore Pain Intervention(s): Monitored during session;Premedicated before session    Home Living                      Prior Function            PT Goals (current goals can now  be found in the care plan section) Acute Rehab PT Goals Patient Stated Goal: less pain, return to PLOF Progress towards PT goals: Progressing toward goals    Frequency    Min 5X/week      PT Plan Current plan remains appropriate    Co-evaluation              AM-PAC PT "6 Clicks" Mobility   Outcome Measure  Help needed turning from your back to your side while in a flat bed without using bedrails?: Total Help needed moving from lying on your back to sitting on the side of a flat bed without using bedrails?: Total Help needed moving to and from a bed to a chair (including a wheelchair)?: Total Help needed  standing up from a chair using your arms (e.g., wheelchair or bedside chair)?: Total Help needed to walk in hospital room?: Total Help needed climbing 3-5 steps with a railing? : Total 6 Click Score: 6    End of Session   Activity Tolerance: Patient limited by pain Patient left: in bed;with call bell/phone within reach;with bed alarm set;with SCD's reapplied Nurse Communication: Mobility status;Patient requests pain meds PT Visit Diagnosis: Pain;Muscle weakness (generalized) (M62.81);Dizziness and giddiness (R42) Pain - part of body: (everywhere)     Time: 1035-1100 PT Time Calculation (min) (ACUTE ONLY): 25 min  Charges:  $Therapeutic Exercise: 8-22 mins $Therapeutic Activity: 8-22 mins                     Kittie Plater, PT, DPT Acute Rehabilitation Services Pager #: [phone] Office #: [phone]    Berline Lopes 06/03/2019, 1:41 PM

## 2019-06-03 NOTE — Plan of Care (Addendum)
  Problem: Pain Managment: Goal: General experience of comfort will improve Description: Pt c/c of pain to posterior neck, LUE, and bilateral lower extremities with activity/movement. Pain is being addressed by Oxycodone PO, Valium PO, and Dilaudid IV PRN and scheduled medications and comfort measures. Pt is aware of PRN pain medications and reminded of frequency him can have medications according to MD order. Him has been given pain management throughout the shift according to order and upon reassessment pt voiced relief of pain or is either asleep in bed. Pt also educated on setting realistic goals post surgical procedure. Pt  instructed to notify nursing staff of increased pain and need for medication. Will continue to monitor and address for adequate and realistic pain management. Outcome: Not Progressing

## 2019-06-03 NOTE — Progress Notes (Signed)
  NEUROSURGERY PROGRESS NOTE   No issues overnight.  Pain still present but much more manageable No new N/T/W  EXAM:  BP 118/81 (BP Location: Right Arm)   Pulse 76   Temp 98.9 F (37.2 C) (Oral)   Resp 18   Ht 5' 10.5" (1.791 m)   Wt 74.1 kg   SpO2 97%   BMI 23.10 kg/m   Awake/alert, FCx4 with L C8 numbness and L C7 dysesthetic pain, lower extremity exam limited by pain (especially proximally) but distally his ankle/toe function is 4-/5  IMPRESSION/PLAN 34 y.o. adult s/p resection of cervical intramedullary tumor, post-op MRI with gross total resection of enhancing portion. Doing fair - continue pain control, PT/OT - hopeful for CIR

## 2019-06-04 MED ORDER — GABAPENTIN 400 MG PO CAPS
400.0000 mg | ORAL_CAPSULE | Freq: Three times a day (TID) | ORAL | Status: DC
  Administered 2019-06-04: 400 mg via ORAL
  Filled 2019-06-04: qty 1

## 2019-06-04 MED ORDER — OXYCODONE HCL 5 MG PO TABS
5.0000 mg | ORAL_TABLET | ORAL | Status: DC | PRN
  Administered 2019-06-04 (×2): 10 mg via ORAL
  Administered 2019-06-04: 5 mg via ORAL
  Administered 2019-06-05 (×2): 10 mg via ORAL
  Administered 2019-06-05 – 2019-06-06 (×2): 5 mg via ORAL
  Administered 2019-06-06 (×2): 10 mg via ORAL
  Administered 2019-06-06: 5 mg via ORAL
  Administered 2019-06-06 – 2019-06-08 (×3): 10 mg via ORAL
  Filled 2019-06-04: qty 1
  Filled 2019-06-04 (×6): qty 2
  Filled 2019-06-04: qty 1
  Filled 2019-06-04: qty 2
  Filled 2019-06-04: qty 1
  Filled 2019-06-04 (×6): qty 2
  Filled 2019-06-04: qty 1

## 2019-06-04 MED ORDER — METHOCARBAMOL 750 MG PO TABS
750.0000 mg | ORAL_TABLET | Freq: Four times a day (QID) | ORAL | Status: DC | PRN
  Administered 2019-06-04 – 2019-06-08 (×6): 750 mg via ORAL
  Filled 2019-06-04 (×6): qty 1

## 2019-06-04 MED ORDER — GABAPENTIN 300 MG PO CAPS
600.0000 mg | ORAL_CAPSULE | Freq: Three times a day (TID) | ORAL | Status: DC
  Administered 2019-06-04 – 2019-06-08 (×13): 600 mg via ORAL
  Filled 2019-06-04 (×7): qty 2
  Filled 2019-06-04: qty 6
  Filled 2019-06-04 (×5): qty 2

## 2019-06-04 MED ORDER — DEXAMETHASONE SODIUM PHOSPHATE 4 MG/ML IJ SOLN
4.0000 mg | Freq: Three times a day (TID) | INTRAMUSCULAR | Status: AC
  Administered 2019-06-04 – 2019-06-05 (×3): 4 mg via INTRAVENOUS
  Filled 2019-06-04 (×3): qty 1

## 2019-06-04 MED ORDER — LIDOCAINE 5 % EX PTCH
1.0000 | MEDICATED_PATCH | CUTANEOUS | Status: DC
  Administered 2019-06-04 – 2019-06-08 (×5): 1 via TRANSDERMAL
  Filled 2019-06-04 (×6): qty 1

## 2019-06-04 MED ORDER — DEXAMETHASONE SODIUM PHOSPHATE 4 MG/ML IJ SOLN
4.0000 mg | Freq: Once | INTRAMUSCULAR | Status: AC
  Administered 2019-06-04: 4 mg via INTRAVENOUS

## 2019-06-04 NOTE — Progress Notes (Signed)
Subjective: Patient reports very unhappy with the care he has been receiving.  Objective: Vital signs in last 24 hours: Temp:  [97.5 F (36.4 C)-99.8 F (37.7 C)] 99 F (37.2 C) (05/30 0719) Pulse Rate:  [92-114] 92 (05/30 0719) Resp:  [17-20] 20 (05/30 0719) BP: (108-125)/(73-81) 124/77 (05/30 0719) SpO2:  [97 %-100 %] 99 % (05/30 0719)  Intake/Output from previous day: 05/29 0701 - 05/30 0700 In: 866 [P.O.:656; I.V.:210] Out: 1675 [Urine:1675] Intake/Output this shift: No intake/output data recorded.  Physical Exam: Left greater than right upper extremity weakness with bilateral lower extremity weakness.  This is stable from immediately post-op.  Patient is complaining of neck pain and upper extremity dysesthesias.    Lab Results: No results for input(s): WBC, HGB, HCT, PLT in the last 72 hours. BMET No results for input(s): NA, K, CL, CO2, GLUCOSE, BUN, CREATININE, CALCIUM in the last 72 hours.  Studies/Results: No results found.  Assessment/Plan: I listened to patient's multiple complaints about the care he has been receiving, including being given bacon for breakfast, since he is Jewish.  I explained that we would premedicate him for therapies and modify his pain medication.  I will increase his gabapentin, restart decadron, add methocarbamol and increase frequency of pain medication.  We will discontinue his urinary catheter and attempt urinal/condom cath.    LOS: 3 days    Peggyann Shoals, MD 06/04/2019, 8:44 AM

## 2019-06-04 NOTE — Progress Notes (Signed)
Pt continues to refuse foley removal.

## 2019-06-05 MED ORDER — DEXAMETHASONE SODIUM PHOSPHATE 4 MG/ML IJ SOLN: 4.0000 mg | Freq: Four times a day (QID) | INTRAMUSCULAR | Status: DC

## 2019-06-05 MED ORDER — DEXAMETHASONE SODIUM PHOSPHATE 4 MG/ML IJ SOLN: 4.0000 mg | Freq: Three times a day (TID) | INTRAMUSCULAR | Status: DC

## 2019-06-05 MED ORDER — FLEET ENEMA 7-19 GM/118ML RE ENEM
1.0000 | ENEMA | Freq: Once | RECTAL | Status: AC
  Administered 2019-06-05: 1 via RECTAL
  Filled 2019-06-05: qty 1

## 2019-06-05 MED ORDER — DEXAMETHASONE SODIUM PHOSPHATE 4 MG/ML IJ SOLN
4.0000 mg | Freq: Three times a day (TID) | INTRAMUSCULAR | Status: DC
  Administered 2019-06-05 – 2019-06-07 (×6): 4 mg via INTRAVENOUS
  Filled 2019-06-05 (×6): qty 1

## 2019-06-05 MED ORDER — BISACODYL 10 MG RE SUPP
10.0000 mg | Freq: Once | RECTAL | Status: AC
  Administered 2019-06-05: 10 mg via RECTAL
  Filled 2019-06-05: qty 1

## 2019-06-05 MED ORDER — MAGNESIUM CITRATE PO SOLN
1.0000 | Freq: Once | ORAL | Status: AC
  Administered 2019-06-05: 1 via ORAL
  Filled 2019-06-05: qty 296

## 2019-06-05 NOTE — Progress Notes (Signed)
This nurse was performing change of shift assessments when approached by unit CNA who informed that pt was requesting a "Nurse and doctor". Nurse entered room and immediately saw pt was in distress. Pt expressed that they were in excruciating rectal pain and stated, "I feel like I am going to burst". Pt expresses feelings of frustration and states that it had been "30-35 minutes" since call bell had been answered and that this was not the first occurrence. Pt states that they feel that team has been negligent in regards to "pain management and promoting bowel movement" also  expresses that they feel like they are being discriminated against "transphobically and homophobically" per pt.  Pt states they are dissatisfied with care over all and do not feel safe on the unit. This nurse provided attentive listening and informed pt that the next step in encouraging a bowel movement was to administer a fleet enema and Magnesium citrate. Pt was agreeable with this approach and was premedicated with PRN analgesic before enema. While very uncomfortable, pt tolerated procedure well and large amounts of stool were cleared from rectum with enema along with digital removal. Pt expresses relief and gratitude associated with procedures, but requests that unit director be made of aware of all that had transpired over the last shift and at shift change. Informed pt that this nurse will make director aware in the AM and will do bed side report at shift change to ensure pt participation in plan of care. Pt family (mother and father) report dissatisfaction with course of care via phone as well. They made requests to come stay with pt overnight and this was relayed to charge nurse and [facility]. Father is currently in room with pt. Pt is currently calm and pain has decreased since enema, they are agreeable with plan of care for the evening. Will continue to assess and monitor.

## 2019-06-05 NOTE — Progress Notes (Signed)
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 PMH includes depression, anxiety, HA.    PT Comments    Pt now with lidocain patch which has improved patients pain. Pt with improved Bilat UE and LE active movement. Pt able to assist with log rolling to L and R and pushing up into sitting. Focused on EOB balance and trunk control with minimal external assist. Pt complete seated bilat LAQ x 6 reps. Pt with progressive bilat UE pain in sitting EOB however did tolerate EOB x 10 min, much improved from saturdays session. Will progress OOB as able next session. Acute PT to cont to follow.    Follow Up Recommendations  CIR;Supervision for mobility/OOB;Supervision/Assistance - 24 hour     Equipment Recommendations  Other (comment)    Recommendations for Other Services Rehab consult     Precautions / Restrictions Precautions Precautions: Fall Precaution Booklet Issued: No Precaution Comments: lightheadedness with EOB Restrictions Weight Bearing Restrictions: No    Mobility  Bed Mobility Overal bed mobility: Needs Assistance Bed Mobility: Rolling;Sidelying to Sit;Sit to Sidelying Rolling: Mod assist;+2 for physical assistance Sidelying to sit: Mod assist;+2 for physical assistance     Sit to sidelying: Mod assist;+2 for physical assistance General bed mobility comments: max directional verbal cues, pt initiating more with UEs and useing bedrail, maxA for LE management, increased time, lightheaded upon first sitting up but then dissipated BP 125/67  Transfers                 General transfer comment: deferred today, will progress next session  Ambulation/Gait             General Gait Details: unable   Stairs              Wheelchair Mobility    Modified Rankin (Stroke Patients Only)       Balance Overall balance assessment: Needs assistance Sitting-balance support: Feet supported Sitting balance-Leahy Scale: Poor Sitting balance - Comments: worked on trunk control and supporting self at [employer] Arousal/Alertness: Awake/alert Behavior During Therapy: Anxious Overall Cognitive Status: Within Functional Limits for tasks assessed                                 General Comments: pt with tangential speech, easily distracted, focused on the pain      Exercises Other Exercises Other Exercises: AAROM to bilat LEs supine in bed Other Exercises: worked on trunk control and achieving midline at EOB with minimal support    General Comments General comments (skin integrity, edema, etc.): BP stable at EOB      Pertinent Vitals/Pain Pain Assessment: 0-10 Pain Score: 8  Pain Location: bilat UE pain and neck pain Pain Descriptors / Indicators: Burning;Tingling;Tender;Grimacing;Guarding;Numbness;Operative site guarding;Pins and needles;Sore Pain Intervention(s): Monitored during session    Home Living                      Prior Function            PT  Goals (current goals can now be found in the care plan section) Progress towards PT goals: Progressing toward goals    Frequency    Min 5X/week      PT Plan Current plan remains appropriate    Co-evaluation              AM-PAC PT "6 Clicks" Mobility   Outcome Measure  Help needed turning from your back to your side while in a flat bed without using bedrails?: A Lot Help needed moving from lying on your back to sitting on the side of a flat bed without using bedrails?: A Lot Help needed moving to and from a bed to a chair (including a wheelchair)?: Total Help needed standing up from a chair using your arms (e.g., wheelchair or bedside chair)?:  Total Help needed to walk in hospital room?: Total Help needed climbing 3-5 steps with a railing? : Total 6 Click Score: 8    End of Session   Activity Tolerance: Patient limited by pain Patient left: in bed;with call bell/phone within reach;with family/visitor present(father) Nurse Communication: Mobility status PT Visit Diagnosis: Pain;Muscle weakness (generalized) (M62.81);Dizziness and giddiness (R42) Pain - part of body: (all over)     Time: [id number] PT Time Calculation (min) (ACUTE ONLY): 36 min  Charges:  $Therapeutic Exercise: 8-22 mins $Neuromuscular Re-education: 8-22 mins                     Kittie Plater, PT, DPT Acute Rehabilitation Services Pager #: [phone] Office #: [phone]    Berline Lopes 06/05/2019, 2:10 PM

## 2019-06-05 NOTE — Progress Notes (Addendum)
Subjective: Patient reports they are feeling a bit better today  Objective: Vital signs in last 24 hours: Temp:  [97.6 F (36.4 C)-98.5 F (36.9 C)] 97.8 F (36.6 C) (05/31 1230) Pulse Rate:  [70-109] 98 (05/31 1230) Resp:  [18-20] 20 (05/31 1230) BP: (107-134)/(73-88) 129/78 (05/31 1230) SpO2:  [94 %-99 %] 98 % (05/31 0435)  Intake/Output from previous day: 05/30 0701 - 05/31 0700 In: 729.9 [P.O.:480; I.V.:249.9] Out: 2525 [Urine:2525] Intake/Output this shift: Total I/O In: -  Out: 1500 [Urine:1500]  Physical Exam: Less neck pain, neck held straighter, improved dysesthesias, active bowel sounds, no bowel movement  Lab Results: No results for input(s): WBC, HGB, HCT, PLT in the last 72 hours. BMET No results for input(s): NA, K, CL, CO2, GLUCOSE, BUN, CREATININE, CALCIUM in the last 72 hours.  Studies/Results: No results found.  Assessment/Plan: Patient is doing better today.  Tolerating gabapentin at 600 mg TID, appreciates lidoderm patch, will work on bowels.  Continuing therapy, will progress as tolerated, with goal of Rehab in the near future.  Hashir and their parents are very unhappy about the care Alejandra has received, including not giving medications before PT, rough moves in bed, inconsistent nursing communication.  I have connected them with the Unit Head Nurse, so that she can better understand and address their concerns.    LOS: 4 days    Peggyann Shoals, MD 06/05/2019, 1:52 PM

## 2019-06-06 DIAGNOSIS — D497 Neoplasm of unspecified behavior of endocrine glands and other parts of nervous system: Secondary | ICD-10-CM

## 2019-06-06 DIAGNOSIS — G8250 Quadriplegia, unspecified: Secondary | ICD-10-CM

## 2019-06-06 LAB — SURGICAL PATHOLOGY

## 2019-06-06 LAB — CBC
HCT: 36.8 % — ABNORMAL LOW (ref 39.0–52.0)
Hemoglobin: 12 g/dL — ABNORMAL LOW (ref 13.0–17.0)
MCH: 28.2 pg (ref 26.0–34.0)
MCHC: 32.6 g/dL (ref 30.0–36.0)
MCV: 86.4 fL (ref 80.0–100.0)
Platelets: 302 10*3/uL (ref 150–400)
RBC: 4.26 MIL/uL (ref 4.22–5.81)
RDW: 13.2 % (ref 11.5–15.5)
WBC: 16.8 10*3/uL — ABNORMAL HIGH (ref 4.0–10.5)
nRBC: 0 % (ref 0.0–0.2)

## 2019-06-06 LAB — CREATININE, SERUM
Creatinine, Ser: 0.87 mg/dL (ref 0.61–1.24)
GFR calc Af Amer: >60 mL/min (ref >60)
GFR calc non Af Amer: >60 mL/min (ref >60)

## 2019-06-06 LAB — GLUCOSE, CAPILLARY
Glucose-Capillary: 147 mg/dL — ABNORMAL HIGH (ref 70–99)
Glucose-Capillary: 163 mg/dL — ABNORMAL HIGH (ref 70–99)
Glucose-Capillary: 182 mg/dL — ABNORMAL HIGH (ref 70–99)
Glucose-Capillary: 190 mg/dL — ABNORMAL HIGH (ref 70–99)

## 2019-06-06 MED ORDER — FAMOTIDINE 20 MG PO TABS
20.0000 mg | ORAL_TABLET | Freq: Two times a day (BID) | ORAL | Status: DC
  Administered 2019-06-06 – 2019-06-07 (×4): 20 mg via ORAL
  Filled 2019-06-06 (×5): qty 1

## 2019-06-06 MED ORDER — ENOXAPARIN SODIUM 40 MG/0.4ML ~~LOC~~ SOLN
40.0000 mg | SUBCUTANEOUS | Status: DC
  Administered 2019-06-06 – 2019-06-07 (×2): 40 mg via SUBCUTANEOUS
  Filled 2019-06-06 (×2): qty 0.4

## 2019-06-06 NOTE — Progress Notes (Signed)
To ensure patients' and patient family concerns are addressed and that the patient is checked in on every hour:  1900: Bedside report with dayshift RN was completed. Pts. Mother at bedside. All concerns addressed and questions answered. Discussed plan for the night with patient.  2000: Performed head to toe assessment with patient. Patients mother and father switching out to stay for the night. Pain concerns addressed and schedule for pain medications at 10pm agreed upon. 2100: Went into patients room to check on them. Patient and patients father were both asleep in room  2200: Administered patients nightly medications. Patient reported pain as a 7-8 with some spasms so PRN Oxycodone and Valium were both administered. Spoke with patient regarding next dose of pain medication and patient responded saying that he would let me know later on what he needs and that he would just like to rest currently.  2300: Went into room to check on patient again. Both patient and patients father resting comfortably and asleep. 0000: Patient and family still resting in room.  0100: Patient and family still resting in room.  0200: Went to remove lidocaine patches from patient. Asked patient about pain and if he needed any more medication at the time and patient reported that he wanted to wait until 4am.  0300: Patient and family resting in room 0400: Went in to assess patients pain but patient stated that he was okay and did not require any medications at the moment. 0500: Patient and family resting in room.  0600: Went in to administer 6am medications. Patient requested pain medication and muscle relaxer. Both were administered. Patient now resting in bed with all concerns addressed.  [id number]: Bedside report given with dayshift RN in room with patient. All concerns and questions addressed.

## 2019-06-06 NOTE — Progress Notes (Signed)
OT Cancellation Note  Patient Details Name: Jacob Lambert MRN: [id number] DOB: 05-Oct-1985   Cancelled Treatment:    Reason Eval/Treat Not Completed: Patient at procedure or test/ unavailable(meeting with MD/staff members)  [facility] 06/06/2019, 3:56 PM

## 2019-06-06 NOTE — Progress Notes (Signed)
Neurosurgery Service Progress Note  Subjective: No acute events overnight, enema yesterday for impaction, dysesthesias improving, strength / sensation grossly stable  Objective: Vitals:   06/05/19 1603 06/05/19 2300 06/06/19 0400 06/06/19 0738  BP: 114/83 123/84 110/73 (!) 114/97  Pulse: 92 87 83 89  Resp: 12 16    Temp: 98.1 F (36.7 C) 98.4 F (36.9 C) 98 F (36.7 C) 97.8 F (36.6 C)  TempSrc:  Oral Oral Oral  SpO2: 100% 98% 98% 96%  Weight:      Height:       Temp (24hrs), Avg:98 F (36.7 C), Min:97.8 F (36.6 C), Max:98.4 F (36.9 C)  CBC Latest Ref Rng & Units 05/30/2019  WBC 4.0 - 10.5 K/uL 11.3(H)  Hemoglobin 13.0 - 17.0 g/dL 14.0  Hematocrit 39.0 - 52.0 % 43.2  Platelets 150 - 400 K/uL 259   BMP Latest Ref Rng & Units 06/01/2019  Glucose 70 - 99 mg/dL 89  BUN 6 - 20 mg/dL 17  Creatinine 0.61 - 1.24 mg/dL 1.19  Sodium 135 - 145 mmol/L 137  Potassium 3.5 - 5.1 mmol/L 4.4  Chloride 98 - 111 mmol/L 102  CO2 22 - 32 mmol/L 22  Calcium 8.9 - 10.3 mg/dL 9.6    Intake/Output Summary (Last 24 hours) at 06/06/2019 0746 Last data filed at 06/05/2019 2100 Gross per 24 hour  Intake 600 ml  Output 3550 ml  Net -2950 ml    Current Facility-Administered Medications:  .  0.9 %  sodium chloride infusion, 250 mL, Intravenous, Continuous, Paola Flynt, Joyice Faster, MD, Stopped at 06/01/19 1930 .  acetaminophen (TYLENOL) tablet 650 mg, 650 mg, Oral, Q4H PRN, 650 mg at 06/04/19 1747 **OR** acetaminophen (TYLENOL) suppository 650 mg, 650 mg, Rectal, Q4H PRN, Meital Riehl, Joyice Faster, MD .  Chlorhexidine Gluconate Cloth 2 % PADS 6 each, 6 each, Topical, Daily, [name], MD, 6 each at 06/05/19 1114 .  dexamethasone (DECADRON) injection 4 mg, 4 mg, Intravenous, Q8H, Erline Levine, MD, 4 mg at 06/06/19 [phone] .  diazepam (VALIUM) tablet 10 mg, 10 mg, Oral, Q6H PRN, [name], MD, 10 mg at 06/06/19 0012 .  docusate sodium (COLACE) capsule 100 mg, 100 mg, Oral, BID, [name], MD, 100 mg at 06/05/19 2217 .  gabapentin (NEURONTIN) capsule 600 mg, 600 mg, Oral, TID, Erline Levine, MD, 600 mg at 06/05/19 2216 .  HYDROmorphone (DILAUDID) injection 1 mg, 1 mg, Intravenous, Q3H PRN, [name], MD, 1 mg at 06/05/19 1806 .  lactated ringers infusion, , Intravenous, Continuous, Lillia Abed, MD, Last Rate: 10 mL/hr at 06/04/19 1900, Rate Verify at 06/04/19 1900 .  lidocaine (LIDODERM) 5 % 1 patch, 1 patch, Transdermal, Q24H, Erline Levine, MD, 1 patch at 06/05/19 1418 .  menthol-cetylpyridinium (CEPACOL) lozenge 3 mg, 1 lozenge, Oral, PRN **OR** phenol (CHLORASEPTIC) mouth spray 1 spray, 1 spray, Mouth/Throat, PRN, Undrea Archbold A, MD .  methocarbamol (ROBAXIN) tablet 750 mg, 750 mg, Oral, Q6H PRN, Erline Levine, MD, 750 mg at 06/05/19 0155 .  ondansetron (ZOFRAN) tablet 4 mg, 4 mg, Oral, Q6H PRN, 4 mg at 06/05/19 2216 **OR** ondansetron (ZOFRAN) injection 4 mg, 4 mg, Intravenous, Q6H PRN, [name], MD, 4 mg at 06/02/19 0345 .  oxyCODONE (Oxy IR/ROXICODONE) immediate release tablet 10 mg, 10 mg, Oral, Q4H PRN, [name], MD, 10 mg at 06/05/19 2013 .  oxyCODONE (Oxy IR/ROXICODONE) immediate release tablet 5-10 mg, 5-10 mg, Oral, Q3H PRN, Erline Levine, MD, 5 mg at  06/06/19 0424 .  polyethylene glycol (MIRALAX / GLYCOLAX) packet 17 g, 17 g, Oral, Daily PRN, [name], MD, 17 g at 06/05/19 0920 .  sodium chloride flush (NS) 0.9 % injection 3 mL, 3 mL, Intravenous, Q12H, Neelam Tiggs, Joyice Faster, MD, 3 mL at 06/05/19 2210 .  sodium chloride flush (NS) 0.9 % injection 3 mL, 3 mL, Intravenous, PRN, [name], MD   Physical Exam: Awake/alert, FCx4 with L C8 > C7 numbness, lower extremity exam limited by pain but distally his ankle/toe function is 4-/5 Incision c/d/i  Assessment & Plan: 34 y.o. s/p resection of cervical intramedullary tumor, post-op MRI with gross total resection of enhancing portion.  Neuro: -diazepam,  methocarbamol for muscle spasms -gabapentin at 600tid for dysesthetic pain  Cardiopulm/FENGI/Heme/ID No issues  Endo -dex 4q8, will do fingersticks x24h and d/c if WNL, start H2 antagonist, start weaning dex tomorrow  Dispo/PPx: -PT/OT as tolerated -SCDs/TEDs, issue over the weekend with bacon w/ breakfast, pt is Jewish, will use LMWH rather than UFH -final path grade 2 ependymoma, excellent news compared to the alternatives, Vaslow already discussed w/ pt / family  [name]  06/06/19 7:46 AM

## 2019-06-06 NOTE — Progress Notes (Signed)
RN wants to ensure Pt's and Pt's family's needs and concerns are met. To ensure RN rounded on Pt hourly the day goes as follows:   7 am hour: Bedside report with night shift nurse, concerns addressed and plan for day was laid out. Pt and Pt's family questions answered 8 am hour: head to toe assessment by RN, pain medicine administered, when RN left Pt comfortable and call bell in reach  9 am hour: pain reassessment, Pt comfortable and waiting for PT to arrive for session. 10 am hour: RN witnessed PT working with Pt, Pt comfortable and sitting in chair, RN will return in 1 hour to help transfer Pt back to bed. 11 am hour: RN and extern transferred Pt back to bed, did peri care, linen and gown change  12 pm hour: reassessment and pain medications administered 1300 hour: pain reassessment, discussion of future pain management regimen  1400 hour: lidocaine patch placed and decadron and PRN valium administered per Pt request. Lunch tray was never delivered to Pt's room. RN called to get tray ordered and sent up ASAP. 1500 hour: pain medication administered per Pt request, tray delivered and Pt ate 100%  1600 hour: reassessment, and pain reassessment, adjustment of lidocaine patch for Pt's comfort 1700 hour: assisted Pt with dinner order for tonight and breakfast order for the am. Per Pt request dinner to be delivered 1930 and breakfast to be delivered 0900 tomorrow. Pt diet order placed as yes with assist so someone should be by tomorrow to take future orders. 1800 hour: Pain medication administered per Pt request, lovenox order started and administered and SCD's in place  1900 hour: Bedside report with oncoming night nurse, concerns addressed and questions answered, when RN left Pt was comfortable and eating dinner with call bell in reach.

## 2019-06-06 NOTE — Consult Note (Signed)
Physical Medicine and Rehabilitation Consult Reason for Consult: Progressive ataxia Referring Physician: Dr. Venetia Constable   HPI: Jacob Lambert is a 34 y.o. right-handed adult male with history of tobacco abuse, anxiety depression as well as headaches.  Per chart review patient lives with parents.  1 level home 2 steps to entry.  Independent with assistive device.  Father is an ED physician at [employer].  Presented 06/01/2019 with progressive ataxia and leg weakness since February 2021.  Recent MRI of the brain negative.  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 Venetia Constable.  Decadron protocol as indicated.  Pathology report pending.  Medical oncology Dr. Sherrilee Gilles is also involved.  Therapy evaluations completed with recommendations of physical medicine rehab consult.   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  Voiding, but PVRS haven't been checked that family is aware of Was impacted with bowel- got "cleaned out"  Review of Systems  Constitutional: Negative for fever.  HENT: Negative for hearing loss.   Eyes: Negative for blurred vision and double vision.  Respiratory: Negative for cough and shortness of breath.   Cardiovascular: Negative for chest pain, palpitations and leg swelling.  Gastrointestinal: Positive for constipation. Negative for heartburn and nausea.  Genitourinary: Negative for dysuria, flank pain and hematuria.  Musculoskeletal: Positive for myalgias.  Skin: Negative for rash.  Neurological: Positive for sensory change, weakness and headaches.  Psychiatric/Behavioral: Positive for depression.       Anxiety  All other systems reviewed and are negative.  Past Medical History:  Diagnosis Date  . Anxiety   . Depression    . Headache   . Pneumonia    "walking"   Past Surgical History:  Procedure Laterality Date  . LAMINECTOMY N/A 06/01/2019   Procedure: Cervical six-seven Laminoplasty, biopsy and resection of intramedullary spinal cord tumor;  Surgeon: [name], MD;  Location: [facility];  Service: Neurosurgery;  Laterality: N/A;  . NO PAST SURGERIES     History reviewed. No pertinent family history. Social History:  reports that Erza Yoffe has quit smoking. Kevinn Tseng has never used smokeless tobacco. [name] reports current alcohol use. Trendell Leahy reports previous drug use. Allergies:  Allergies  Allergen Reactions  . Cephalosporins Hives  . Other     Patagonion toothfish   Medications Prior to Admission  Medication Sig Dispense Refill  . acetaminophen (TYLENOL) 500 MG tablet Take 500 mg by mouth every 6 (six) hours as needed.    Marland Kitchen dexamethasone (DECADRON) 4 MG tablet Take 1 tablet (4 mg total) by mouth daily. (Patient taking differently: Take 4-8 mg by mouth See admin instructions. Take 2 tablets (8 mg) by mouth on 05/21 & 05/22, then reduce to 1 tablet (4 mg) by mouth daily thereafter.) 30 tablet 0  . glucosamine-chondroitin 500-400 MG tablet Take 1 tablet by mouth 3 (three) times daily.    . methocarbamol (ROBAXIN) 500 MG tablet Take 500 mg by mouth 2 (two) times daily as needed (spasms.).     Marland Kitchen Multiple Vitamin (MULTIVITAMIN WITH MINERALS) TABS tablet Take 1 tablet by mouth daily.    . naproxen sodium (ALEVE) 220 MG tablet Take 220-440 mg by mouth 2 (two) times daily as needed (pain.).      Home: Home Living Family/patient expects to be  discharged to:: Private residence Living Arrangements: Parent Available Help at Discharge: Family, Available PRN/intermittently(Dad is MD at [employer] and mom is a Marine scientist) Type of Home: House Home Access: Stairs to enter [employer] of Steps: 2 from garage Entrance Stairs-Rails: (sort of handrail) Home Layout: One level, Laundry or work  area in basement(20 stairs to get to basement but able to live on main level at d/c) Bathroom Shower/Tub: Chiropodist: Standard Home Equipment: Other (comment) Additional Comments: tens machine  Functional History: Prior Function Level of Independence: Independent with assistive device(s) Comments: Using SPC. Slipped in shower twice; able to dress self but took longer. Difficulty with shoes/socks. Doing some cleaning/cooking. Functional Status:  Mobility: Bed Mobility Overal bed mobility: Needs Assistance Bed Mobility: Rolling, Sidelying to Sit, Sit to Sidelying Rolling: Mod assist, +2 for physical assistance Sidelying to sit: Mod assist, +2 for physical assistance Sit to sidelying: Mod assist, +2 for physical assistance General bed mobility comments: max directional verbal cues, pt initiating more with UEs and useing bedrail, maxA for LE management, increased time, lightheaded upon first sitting up but then dissipated BP 125/67 Transfers General transfer comment: deferred today, will progress next session Ambulation/Gait General Gait Details: unable    ADL: ADL Overall ADL's : Needs assistance/impaired Eating/Feeding: Set up Eating/Feeding Details (indicate cue type and reason): requires assist to setup breakfast tray end of session including opening containers Grooming: Moderate assistance, Bed level General ADL Comments: pt otherwise requiring maxA up to [place] for ADL tasks given significant pain with any mobility/functional task  Cognition: Cognition Overall Cognitive Status: Within Functional Limits for tasks assessed Orientation Level: Oriented X4 Cognition Arousal/Alertness: Awake/alert Behavior During Therapy: Anxious Overall Cognitive Status: Within Functional Limits for tasks assessed General Comments: pt with tangential speech, easily distracted, focused on the pain  Blood pressure 123/84, pulse 87, temperature 98.4 F (36.9 C), temperature  source Oral, resp. rate 16, height 5' 10.5" (1.791 m), weight 74.1 kg, SpO2 98 %. Physical Exam  Nursing note and vitals reviewed. Constitutional: Jacob Lambert is oriented to person, place, and time. Jacob Lambert appears well-developed and well-nourished.  Patient is sitting up slightly in bed- mother and father at bedside, as well as RN, NAD Pt is L handed  HENT:  Head: Normocephalic and atraumatic.  Nose: Nose normal.  Mouth/Throat: Oropharynx is clear and moist. No oropharyngeal exudate.  Eyes: Conjunctivae are normal. Right eye exhibits no discharge. Left eye exhibits no discharge. No scleral icterus.  Neck: No tracheal deviation present.  Cardiovascular:  RRR- no JVD  Respiratory: No stridor.  CTA B/L- no W/R/R- good air movement   GI:  Soft, NT, ND, (+)BS    Genitourinary:    Genitourinary Comments: Has condom catheter in place   Musculoskeletal:     Cervical back: Neck supple.     Comments: Did full strength/sensory exam  RUE_ biceps 5/5, WE 4+/5, tricep 4/5, grip 4/5, finger abd 4/5 LUE-- biceps 5/5, WE 4-/5, triceps 3+/5, grip 4-/5, finger abd 4-/5 RLE- HF 3-/5, KE 4/5, DF 4/5, PF 4/5, EHL 4-/5 LLE- HF 3/5, KE 2/5, DF 5/5, PF 5/5, EHL 4/5  Neurological: Jacob Lambert is alert and oriented to person, place, and time.  Patient is alert in no acute distress.  Oriented x3 and follows commands.  No hoffman's B/L 2-3 beats clonus in LEs B/L RUE C5-T1 normal sensation- 2/2 LUE- C5 nml 2/2; C6-T1 decreased 1/2 Torso intact 2/2 sensation RLE- L1 intact; otherwise  Decreased 1/2 sensation except L4 absent  0/2- L5-S1 decreased LLE- decreased sensation L1-L2 1/2; absent L3/L4 0/2; decreased L5/S1 1/2   Skin: Skin is warm and dry.  Was not able to assess back/neck incision  Psychiatric:  Frustrated overall- but cordial during 2nd visit    No results found for this or any previous visit (from the past 24 hour(s)). No results found.   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 Does the need for close, 24 hr/day medical supervision in concert with the patient's rehab needs make it unreasonable for this patient to be served in a less intensive setting? Potentially 2. Co-Morbidities requiring supervision/potential complications: quadriparesis, R legal blindness; post op pain; possible neurogenic bowel and bladder 3. Due to bladder management, bowel management, skin/wound care, disease management, medication administration, pain management and patient education, does the patient require 24 hr/day rehab nursing? Potentially 4. Does the patient require coordinated care of a physician, rehab nurse, therapy disciplines of PT, OT, SLP to address physical and functional deficits in the context of the above medical diagnosis(es)? Potentially Addressing deficits in the following areas: balance, endurance, locomotion, strength, transferring, bathing, dressing, feeding, grooming and toileting 5. Can the patient actively participate in an intensive therapy program of at least 3 hrs of therapy per day at least 5 days per week? Potentially 6. The potential for patient to make measurable gains while on inpatient rehab is good 7. Anticipated functional outcomes upon discharge from inpatient rehab are modified independent and supervision  with PT, modified independent and supervision with OT, n/a with SLP. 8. Estimated rehab length of stay to reach the above functional goals is: 3+ weeks 9. Anticipated discharge destination: Home 10. Overall Rehab/Functional Prognosis: good  RECOMMENDATIONS: This patient's condition is appropriate for continued rehabilitative care in the following setting: CIR Patient has agreed to participate in recommended program. Potentially Note that insurance prior authorization may be required for reimbursement for recommended care.  Comment:  1. Pt is interested in going to [facility] for Acute [facility] inpt rehab.  2. We discussed his ASIA level is C5 ASIA D (although not supposed to use +/- on ASIA, was trying to differentiate between weaker and stronger limbs, FYI).  3. Suggest PVRs to be done to make sure pt emptying- if done x 24 hours and are fine, then no need to continue- if has urinary retention, suggest Flomax 0.4 mg nightly. 4. Suggest aggressive bowel program since was impacted. Doesn't need neurogenic bowel program at this time, however will see what occurs- if he has control over bowels with more normal BMs.  5. Pain regimen per primary team- of note L 3rd digit is burning- might try lidocaine cream on that QID, just to see if might work.  6. Thank you for this consult.  7. I spent a total of 30 minutes initially; then 45 minutes 2nd visit and 20 minutes typing up and going over chart, so 1 hour 35 minutes total.   Lavon Paganini Angiulli, PA-C 06/06/2019   I have personally performed a face to face diagnostic evaluation of this patient and formulated the key components of the plan.  Additionally, I have personally reviewed laboratory data, imaging studies, as well as relevant notes and concur with the physician assistant's documentation above.

## 2019-06-06 NOTE — Progress Notes (Signed)
Physical Therapy Treatment Patient Details Name: Jacob Lambert MRN: [id number] DOB: Mar 14, 1985 Today's Date: 06/06/2019    History of Present Illness Patient is a 34 y/o male who presents with a cervical lesion and cyst s/pC6-7 laminoplasty, biopsy and resection of spinal cord tumor 5/27. PMH includes depression, anxiety, HA.    PT Comments    Patient progressing this session to OOB and able to take steps bed to chair/BSC.  Still somewhat painful and light headed, but with leg wraps BP stable throughout session.  Reports improved since BM overnight, but still having loose stools and is unaware.  Encouraged up in chair for 1 hour today and nursing informed of mobilitys status.  Feel he remains appropriate for CIR level rehab at d/c.  PT to continue to follow acutely.    Follow Up Recommendations  CIR;Supervision for mobility/OOB;Supervision/Assistance - 24 hour     Equipment Recommendations  Other (comment)(TBA at next venue)    Recommendations for Other Services       Precautions / Restrictions Precautions Precautions: Fall Precaution Comments: watch BP, wrapped legs for orthostasis Restrictions Weight Bearing Restrictions: No    Mobility  Bed Mobility Overal bed mobility: Needs Assistance Bed Mobility: Rolling;Sidelying to Sit Rolling: Min assist Sidelying to sit: Min assist       General bed mobility comments: assist and cues to flex L knee, side to sit increased time, heavy use of rail, min A for spoting/prevent falling back  Transfers Overall transfer level: Needs assistance Equipment used: Rolling walker (2 wheeled) Transfers: Sit to/from [facility] Sit to Stand: Mod assist;From elevated surface;Min assist Stand pivot transfers: Total assist;Mod assist       General transfer comment: up to [facility] first to get to [facility] min to mod A for balance, cues for hand placement, lifting assist, then used RW up from [facility] to stand and step to bed with mod to min A  to prevent knee buckling and for balance; stand from EOB step to recliner with RW and min A max cues for managing walker, stepping back to chair  Ambulation/Gait                 Stairs             Wheelchair Mobility    Modified Rankin (Stroke Patients Only)       Balance Overall balance assessment: Needs assistance Sitting-balance support: Feet supported Sitting balance-Leahy Scale: Poor Sitting balance - Comments: sat with hands on his knees, initially leaning and minguard for balance, progressed to S over time with improved foot support on floor   Standing balance support: Bilateral upper extremity supported Standing balance-Leahy Scale: Poor Standing balance comment: UE support needed, stood for hygiene after toileting attempt with bilateral knees buckling and cues for hip/knee extension throughout stood about 30-45 seconds                            Cognition   Behavior During Therapy: [facility] for tasks assessed/performed Overall Cognitive Status: Within Functional Limits for tasks assessed                                 General Comments: cues to redirect      Exercises General Exercises - Lower Extremity Ankle Circles/Pumps: AROM;5 reps;Supine Hip Flexion/Marching: AROM;5 reps;Seated Heel Raises: AROM;Strengthening;5 reps;Seated(with manual pressure on knees)    General Comments General comments (  skin integrity, edema, etc.): dad in the room and supported legs for wrapping, encouraging pt and taking photos; BP stable once ace wraps applied with some light headedness still at EOB      Pertinent Vitals/Pain Pain Assessment: Faces Faces Pain Scale: Hurts even more Pain Location: R side of neck L shoulder Pain Descriptors / Indicators: Grimacing;Spasm;Sore Pain Intervention(s): Monitored during session;Premedicated before session    Home Living                      Prior Function            PT Goals (current goals  can now be found in the care plan section) Progress towards PT goals: Progressing toward goals    Frequency    Min 5X/week      PT Plan Current plan remains appropriate    Co-evaluation              AM-PAC PT "6 Clicks" Mobility   Outcome Measure  Help needed turning from your back to your side while in a flat bed without using bedrails?: A Little Help needed moving from lying on your back to sitting on the side of a flat bed without using bedrails?: A Little Help needed moving to and from a bed to a chair (including a wheelchair)?: A Lot Help needed standing up from a chair using your arms (e.g., wheelchair or bedside chair)?: A Lot Help needed to walk in hospital room?: A Lot Help needed climbing 3-5 steps with a railing? : Total 6 Click Score: 13    End of Session Equipment Utilized During Treatment: Gait belt Activity Tolerance: Patient tolerated treatment well Patient left: in chair;with call bell/phone within reach;with chair alarm set;with family/visitor present Nurse Communication: Mobility status PT Visit Diagnosis: Pain;Muscle weakness (generalized) (M62.81);Other abnormalities of gait and mobility (R26.89)     Time: [id number] PT Time Calculation (min) (ACUTE ONLY): 54 min  Charges:  $Therapeutic Exercise: 8-22 mins $Therapeutic Activity: 23-37 mins                     Magda Kiel, [place] Acute Rehabilitation Services [phone] 06/06/2019    Reginia Naas 06/06/2019, 10:33 AM

## 2019-06-07 LAB — GLUCOSE, CAPILLARY: Glucose-Capillary: 121 mg/dL — ABNORMAL HIGH (ref 70–99)

## 2019-06-07 MED ORDER — MAGNESIUM CITRATE PO SOLN
1.0000 | Freq: Once | ORAL | Status: AC
  Administered 2019-06-07: 1 via ORAL
  Filled 2019-06-07: qty 296

## 2019-06-07 MED ORDER — DEXAMETHASONE SODIUM PHOSPHATE 4 MG/ML IJ SOLN
4.0000 mg | Freq: Two times a day (BID) | INTRAMUSCULAR | Status: DC
  Administered 2019-06-07: 4 mg via INTRAVENOUS
  Filled 2019-06-07 (×2): qty 1

## 2019-06-07 MED ORDER — POLYETHYLENE GLYCOL 3350 17 G PO PACK
17.0000 g | PACK | Freq: Every day | ORAL | Status: DC
  Administered 2019-06-07 – 2019-06-08 (×2): 17 g via ORAL
  Filled 2019-06-07 (×2): qty 1

## 2019-06-07 NOTE — Progress Notes (Signed)
Call to on call Dr on call for neuro surgery patient requesting mag citrate for bowel movement.

## 2019-06-07 NOTE — Progress Notes (Signed)
Occupational Therapy Treatment Patient Details Name: Jacob Lambert MRN: [id number] DOB: 06-18-1985 Today's Date: 06/07/2019    History of present illness Patient is a 34 y/o male who presents with a cervical lesion and cyst s/pC6-7 laminoplasty, biopsy and resection of spinal cord tumor 5/27. PMH includes depression, anxiety, HA.   OT comments  Pt making steady progress towards OT goals this session. Pt continues to present with decreased LUE FMC and strength impacting pts ability to engage in BADLs. Issued pt level 1 theraputty and written HEP to facilitate strength and ROM for higher level BADLs. Pt additionally completed therex as indicated below with good carryover and no c/o increased pain. Issued pt built up foam to use for hand writing as pt expressed desire to work on Engineer, production. Additionally discussed compensatory methods related to ADLs with pt verbalizing understanding. DC plan remains appropriate, will follow acutely per POC.    Follow Up Recommendations  CIR    Equipment Recommendations  Other (comment)(TBD)    Recommendations for Other Services      Precautions / Restrictions Precautions Precautions: Fall Precaution Comments: watch BP, wrapped legs for orthostasis Restrictions Weight Bearing Restrictions: No       Mobility Bed Mobility Overal bed mobility: Needs Assistance Bed Mobility: Sit to Sidelying;Rolling Rolling: Supervision       Sit to sidelying: Mod assist General bed mobility comments: good carryover of log roll technique when returning to supine. assist to return BLEs back to sidelying  Transfers Overall transfer level: Needs assistance Equipment used: Rolling walker (2 wheeled) Transfers: Sit to/from [facility] Sit to Stand: Min guard Stand pivot transfers: Min guard       General transfer comment: pt able to return to bed with min guard- supervision overall. pt able to direct level of care cueing therapist and dad of  what they needed    Balance Overall balance assessment: Needs assistance Sitting-balance support: Feet supported Sitting balance-Leahy Scale: Fair     Standing balance support: Bilateral upper extremity supported Standing balance-Leahy Scale: Poor Standing balance comment: reliant on BUE support                           ADL either performed or assessed with clinical judgement   ADL Overall ADL's : Needs assistance/impaired   Eating/Feeding Details (indicate cue type and reason): reports difficulty drinking out of mug; issued pt wide handle mug with good success                     Toilet Transfer: RW;Supervision/safety;Stand-pivot Armed forces technical officer Details (indicate cue type and reason): simulated via stand pivot back to bed; pt prefers dad assist with transfer; close supervision for safety         Functional mobility during ADLs: Supervision/safety;Rolling walker;Cueing for safety General ADL Comments: pt making good progress towards OT goals this session. session focus BUE [place]ges therex and strengthening via theraputty.     Vision       Perception     Praxis      Cognition Arousal/Alertness: Awake/alert Behavior During Therapy: WFL for tasks assessed/performed Overall Cognitive Status: Within Functional Limits for tasks assessed                                          Exercises Hand Exercises Digit Composite Flexion: AROM;Left;5 reps;Seated Composite Extension: AROM;Left;5  reps;Seated Digit Composite Abduction: AROM;Left;5 reps;Seated Digit Composite Adduction: AROM;Left;5 reps;Seated Digit Lifts: AROM;Left;5 reps;Seated Opposition: AROM;Left;5 reps;Seated Hand Activities Writing Skills: Left;Other (comment)(issued pt built up foam with pt able to write with L hand) Other Exercises Other Exercises: issued pt level 1 theraputty with HEP; pincer grasp, digit ABD/ADD   Shoulder Instructions       General Comments dad in room  assisting with session appropriately, issued pt mug and built up foam to assist with hand writing. Pt has level 1 theraputty and HEP as well as [facility] therex    Pertinent Vitals/ Pain       Pain Assessment: Faces Faces Pain Scale: Hurts even more Pain Location: R side of neck L shoulder from PT session Pain Descriptors / Indicators: Grimacing;Sore Pain Intervention(s): Monitored during session;Repositioned  Home Living                                          Prior Functioning/Environment              Frequency  Min 2X/week        Progress Toward Goals  OT Goals(current goals can now be found in the care plan section)  Progress towards OT goals: Progressing toward goals  Acute Rehab OT Goals Patient Stated Goal: less pain, return to PLOF OT Goal Formulation: With patient Time For Goal Achievement: 06/16/19 Potential to Achieve Goals: [name] Discharge plan remains appropriate    Co-evaluation                 AM-PAC OT "6 Clicks" Daily Activity     Outcome Measure   Help from another person eating meals?: None Help from another person taking care of personal grooming?: A Little Help from another person toileting, which includes using toliet, bedpan, or urinal?: A Lot Help from another person bathing (including washing, rinsing, drying)?: A Lot Help from another person to put on and taking off regular upper body clothing?: A Little Help from another person to put on and taking off regular lower body clothing?: A Lot 6 Click Score: 16    End of Session    OT Visit Diagnosis: Other abnormalities of gait and mobility (R26.89);Muscle weakness (generalized) (M62.81);Pain   Activity Tolerance Patient tolerated treatment well   Patient Left in bed;with call bell/phone within reach;with family/visitor present   Nurse Communication          Time: [id number] OT Time Calculation (min): 30 min  Charges: OT General Charges $OT Visit: 1  Visit OT Treatments $Therapeutic Exercise: 23-37 mins  Jacob Clam., COTA/L Acute Rehabilitation Services [phone]    Jacob Lambert 06/07/2019, 2:44 PM

## 2019-06-07 NOTE — Progress Notes (Signed)
Inpatient Rehab Admissions:  Inpatient Rehab Consult received.  I met with patient and their family at the bedside for rehabilitation assessment and to discuss goals and expectations of an inpatient rehab admission.  They are open to CIR at [facility].  Allowed space for pt to express their concerns regarding care (both in the acute setting, and anticipatorily on CIR), and reviewed practices and policies that we use to help ensure a standard of care that is excellent.  We discussed therapy schedules, nursing and leadership rounding, discharge planning, and ensuring a space where they felt safe.  I encouraged them to call me with any questions.  Will open insurance for possible admission tomorrow pending medical readiness and insurance authorization.   Signed: Shann Medal, PT, DPT Admissions Coordinator [phone] 06/07/19  4:06 PM

## 2019-06-07 NOTE — Progress Notes (Signed)
Sent Dr. Joaquim Nam message to advise PVR not obtained.  Pt had  > 1852mL urine output 7a-7p.  Requested order to be extended if PVR still needed.

## 2019-06-07 NOTE — Progress Notes (Signed)
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 PMH includes depression, anxiety, HA.    PT Comments    Patient progressing with mobility able to ambulate in hallway with chair follow.  Still with abnormal gait with compensatory strategies and high risk for falls due to knee bucking, scissoring and fatigue.   They remain appropriate for CIR level rehab upon d/c.  PT to follow.   Follow Up Recommendations  CIR;Supervision for mobility/OOB;Supervision/Assistance - 24 hour     Equipment Recommendations  Other (comment)(TBA at next venue)    Recommendations for Other Services       Precautions / Restrictions Precautions Precautions: Fall Precaution Comments: watch BP Restrictions Weight Bearing Restrictions: No    Mobility  Bed Mobility Overal bed mobility: Needs Assistance Bed Mobility: Rolling;Sidelying to Sit Rolling: Supervision Sidelying to sit: Supervision     Sit to sidelying: Mod assist General bed mobility comments: increased time, heavy UE support needed on rail; good technique  Transfers Overall transfer level: Needs assistance Equipment used: Rolling walker (2 wheeled) Transfers: Sit to/from Stand Sit to Stand: Min assist;From elevated surface Stand pivot transfers: Min guard       General transfer comment: cues for hand placement and slight elevation of bed to stand from EOB, cue for controlled stand to sit into recliner  Ambulation/Gait Ambulation/Gait assistance: Min assist;+2 safety/equipment Gait Distance (Feet): 75 Feet(x 3) Assistive device: Rolling walker (2 wheeled) Gait Pattern/deviations: Step-to pattern;Step-through pattern;Decreased stride length;Narrow base of support;Scissoring   Gait velocity  interpretation: <1.8 ft/sec, indicate of risk for recurrent falls General Gait Details: at times crossing over and stepping on sock, cues throughout for wider BOS; note some hip extension in stance due to core weakness and L LE weaker more episodes of knee buckling on L than R, noted also medial/lateral hip instability   Stairs             Wheelchair Mobility    Modified Rankin (Stroke Patients Only)       Balance Overall balance assessment: Needs assistance Sitting-balance support: Feet supported Sitting balance-Leahy Scale: Fair     Standing balance support: Bilateral upper extremity supported Standing balance-Leahy Scale: Poor Standing balance comment: reliant on BUE support, but knees not buckling in static stance                            Cognition Arousal/Alertness: Awake/alert Behavior During Therapy: WFL for tasks assessed/performed Overall Cognitive Status: Within Functional Limits for tasks assessed                                        Exercises Hand Exercises Digit Composite Flexion: AROM;Left;5 reps;Seated Composite Extension: AROM;Left;5 reps;Seated Digit Composite Abduction: AROM;Left;5 reps;Seated Digit Composite Adduction: AROM;Left;5 reps;Seated Digit Lifts: AROM;Left;5 reps;Seated Opposition: AROM;Left;5 reps;Seated Hand Activities Writing Skills: Left;Other (comment)(issued pt built up foam with pt able to write with L hand) Other Exercises Other Exercises: Educated on seated trunk flex/ext, seated marching and supine bridging for HEP    General Comments General comments (skin integrity, edema, etc.): dad assisting with chair follow for hallway ambulation      Pertinent Vitals/Pain Pain Assessment:  Faces Faces Pain Scale: Hurts little more Pain Location: R side of neck, L shoulder Pain Descriptors / Indicators: Sore;Guarding Pain Intervention(s): Monitored during session    Home Living                       Prior Function            PT Goals (current goals can now be found in the care plan section) Acute Rehab PT Goals Patient Stated Goal: less pain, return to PLOF Progress towards PT goals: Goals met and updated - see care plan    Frequency    Min 5X/week      PT Plan Current plan remains appropriate    Co-evaluation              AM-PAC PT "6 Clicks" Mobility   Outcome Measure  Help needed turning from your back to your side while in a flat bed without using bedrails?: A Little Help needed moving from lying on your back to sitting on the side of a flat bed without using bedrails?: A Little Help needed moving to and from a bed to a chair (including a wheelchair)?: A Little Help needed standing up from a chair using your arms (e.g., wheelchair or bedside chair)?: A Little Help needed to walk in hospital room?: A Little Help needed climbing 3-5 steps with a railing? : A Lot 6 Click Score: 17    End of Session Equipment Utilized During Treatment: Gait belt Activity Tolerance: Patient tolerated treatment well Patient left: in chair;with call bell/phone within reach;with family/visitor present   PT Visit Diagnosis: Pain;Muscle weakness (generalized) (M62.81);Other abnormalities of gait and mobility (R26.89) Pain - Right/Left: Left Pain - part of body: Shoulder     Time: 1240-1317 PT Time Calculation (min) (ACUTE ONLY): 37 min  Charges:  $Gait Training: 23-37 mins                     Jacob Lambert, [place] [facility] [phone] 06/07/2019    Jacob Lambert 06/07/2019, 4:40 PM

## 2019-06-07 NOTE — Progress Notes (Addendum)
Hourly rounding performed:  0730:  Bedside report 0830:  Full assessment 0915:  Administered morning meds 1045:  Oxy and Robaxin given for pain 1130:  Reassessed pain 1200:  Reassessed patient 1230:  Administered Tylenol 1330:  Rounded on pt, reassessed pain 1430:  Administered afternoon meds, gave Oxy for pain 1530: Reassessed patient  1630:  Rounded on patient 1730:  Rounded on patient 1800:  Administered scheduled meds 1840:  Bedside report with nightshift RN

## 2019-06-07 NOTE — Progress Notes (Signed)
Neurosurgery Service Progress Note  Subjective: No acute events overnight  Objective: Vitals:   06/06/19 1949 06/07/19 0005 06/07/19 0456 06/07/19 0731  BP: 122/80 108/68 116/67 122/86  Pulse: 80 74 68 78  Resp:  20  14  Temp: 98.1 F (36.7 C) 97.9 F (36.6 C) 97.8 F (36.6 C) 97.8 F (36.6 C)  TempSrc: Oral Oral Oral Oral  SpO2:  98%  97%  Weight:      Height:       Temp (24hrs), Avg:98.1 F (36.7 C), Min:97.8 F (36.6 C), Max:98.9 F (37.2 C)  CBC Latest Ref Rng & Units 06/06/2019 05/30/2019  WBC 4.0 - 10.5 K/uL 16.8(H) 11.3(H)  Hemoglobin 13.0 - 17.0 g/dL 12.0(L) 14.0  Hematocrit 39.0 - 52.0 % 36.8(L) 43.2  Platelets 150 - 400 K/uL 302 259   BMP Latest Ref Rng & Units 06/06/2019 06/01/2019  Glucose 70 - 99 mg/dL - 89  BUN 6 - 20 mg/dL - 17  Creatinine 0.61 - 1.24 mg/dL 0.87 1.19  Sodium 135 - 145 mmol/L - 137  Potassium 3.5 - 5.1 mmol/L - 4.4  Chloride 98 - 111 mmol/L - 102  CO2 22 - 32 mmol/L - 22  Calcium 8.9 - 10.3 mg/dL - 9.6    Intake/Output Summary (Last 24 hours) at 06/07/2019 0750 Last data filed at 06/06/2019 1715 Gross per 24 hour  Intake 240 ml  Output 750 ml  Net -510 ml    Current Facility-Administered Medications:  .  0.9 %  sodium chloride infusion, 250 mL, Intravenous, Continuous, Heela Heishman, Joyice Faster, MD, Stopped at 06/01/19 1930 .  acetaminophen (TYLENOL) tablet 650 mg, 650 mg, Oral, Q4H PRN, 650 mg at 06/04/19 1747 **OR** acetaminophen (TYLENOL) suppository 650 mg, 650 mg, Rectal, Q4H PRN, Acxel Dingee, Joyice Faster, MD .  Chlorhexidine Gluconate Cloth 2 % PADS 6 each, 6 each, Topical, Daily, [name], MD, 6 each at 06/06/19 0818 .  dexamethasone (DECADRON) injection 4 mg, 4 mg, Intravenous, Q8H, Erline Levine, MD, 4 mg at 06/07/19 0615 .  diazepam (VALIUM) tablet 10 mg, 10 mg, Oral, Q6H PRN, [name], MD, 10 mg at 06/07/19 [id number] .  docusate sodium (COLACE) capsule 100 mg, 100 mg, Oral, BID, [name], MD, 100 mg at 06/06/19  2208 .  enoxaparin (LOVENOX) injection 40 mg, 40 mg, Subcutaneous, Q24H, Merina Behrendt, Joyice Faster, MD, 40 mg at 06/06/19 1830 .  famotidine (PEPCID) tablet 20 mg, 20 mg, Oral, BID, [name], MD, 20 mg at 06/06/19 2208 .  gabapentin (NEURONTIN) capsule 600 mg, 600 mg, Oral, TID, Erline Levine, MD, 600 mg at 06/06/19 2208 .  HYDROmorphone (DILAUDID) injection 1 mg, 1 mg, Intravenous, Q3H PRN, [name], MD, 1 mg at 06/05/19 1806 .  lactated ringers infusion, , Intravenous, Continuous, Lillia Abed, MD, Last Rate: 10 mL/hr at 06/04/19 1900, Rate Verify at 06/04/19 1900 .  lidocaine (LIDODERM) 5 % 1 patch, 1 patch, Transdermal, Q24H, Erline Levine, MD, 1 patch at 06/06/19 1418 .  menthol-cetylpyridinium (CEPACOL) lozenge 3 mg, 1 lozenge, Oral, PRN **OR** phenol (CHLORASEPTIC) mouth spray 1 spray, 1 spray, Mouth/Throat, PRN, Aariv Medlock A, MD .  methocarbamol (ROBAXIN) tablet 750 mg, 750 mg, Oral, Q6H PRN, Erline Levine, MD, 750 mg at 06/05/19 0155 .  ondansetron (ZOFRAN) tablet 4 mg, 4 mg, Oral, Q6H PRN, 4 mg at 06/05/19 2216 **OR** ondansetron (ZOFRAN) injection 4 mg, 4 mg, Intravenous, Q6H PRN, [name], MD, 4 mg at 06/02/19 0345 .  oxyCODONE (  Oxy IR/ROXICODONE) immediate release tablet 10 mg, 10 mg, Oral, Q4H PRN, [name], MD, 10 mg at 06/07/19 [id number] .  oxyCODONE (Oxy IR/ROXICODONE) immediate release tablet 5-10 mg, 5-10 mg, Oral, Q3H PRN, Erline Levine, MD, 10 mg at 06/06/19 1830 .  polyethylene glycol (MIRALAX / GLYCOLAX) packet 17 g, 17 g, Oral, Daily PRN, [name], MD, 17 g at 06/06/19 0810 .  sodium chloride flush (NS) 0.9 % injection 3 mL, 3 mL, Intravenous, Q12H, Chrystopher Stangl A, MD, 3 mL at 06/06/19 2200 .  sodium chloride flush (NS) 0.9 % injection 3 mL, 3 mL, Intravenous, PRN, [name], MD   Physical Exam: Awake/alert, FCx4 with L C8 > C7 numbness, lower extremity exam limited by pain but roughly 4-/5 in BLE diffusely,  ankle clonus present on R but resolved on L Incision c/d/i  Assessment & Plan: 34 y.o. s/p resection of cervical intramedullary tumor, post-op MRI with gross total resection of enhancing portion.  Neuro: -diazepam, methocarbamol for muscle spasms -gabapentin at 600tid for dysesthetic pain -PM&R recs: PVRs x24h, if retention then flomax 0.4qhs  Cardiopulm/Heme/ID No issues  FENGI -bowel regimen, will schedule miralax until he is going regularly  Endo -dex 4q8-->4bid, fingersticks within acceptable limits x24h, will d/c checks, weaning dex, on H2 antagonist  Dispo/PPx: -PT/OT as tolerated -SCDs/TEDs, LMWH for PPx to avoid porcine-related agents -final path grade 2 ependymoma  [name]  06/07/19 7:50 AM

## 2019-06-08 ENCOUNTER — Other Ambulatory Visit: Payer: Self-pay

## 2019-06-08 ENCOUNTER — Inpatient Hospital Stay (HOSPITAL_COMMUNITY)
Admission: AD | Admit: 2019-06-08 | Discharge: 2019-06-28 | DRG: 559 | Disposition: A | Discharge status: Home Health | Payer: BC Managed Care – PPO | Source: Intra-hospital | Attending: Physical Medicine and Rehabilitation | Admitting: Physical Medicine and Rehabilitation

## 2019-06-08 ENCOUNTER — Encounter (HOSPITAL_COMMUNITY): Payer: Self-pay | Admitting: Physical Medicine and Rehabilitation

## 2019-06-08 DIAGNOSIS — G8918 Other acute postprocedural pain: Secondary | ICD-10-CM

## 2019-06-08 DIAGNOSIS — G8250 Quadriplegia, unspecified: Secondary | ICD-10-CM | POA: Diagnosis present

## 2019-06-08 DIAGNOSIS — G43909 Migraine, unspecified, not intractable, without status migrainosus: Secondary | ICD-10-CM | POA: Diagnosis not present

## 2019-06-08 DIAGNOSIS — R109 Unspecified abdominal pain: Secondary | ICD-10-CM | POA: Diagnosis not present

## 2019-06-08 DIAGNOSIS — R000 Tachycardia, unspecified: Secondary | ICD-10-CM | POA: Diagnosis not present

## 2019-06-08 DIAGNOSIS — D497 Neoplasm of unspecified behavior of endocrine glands and other parts of nervous system: Secondary | ICD-10-CM

## 2019-06-08 DIAGNOSIS — G959 Disease of spinal cord, unspecified: Secondary | ICD-10-CM | POA: Diagnosis not present

## 2019-06-08 DIAGNOSIS — R52 Pain, unspecified: Secondary | ICD-10-CM

## 2019-06-08 DIAGNOSIS — C719 Malignant neoplasm of brain, unspecified: Secondary | ICD-10-CM | POA: Diagnosis not present

## 2019-06-08 DIAGNOSIS — Z881 Allergy status to other antibiotic agents status: Secondary | ICD-10-CM

## 2019-06-08 DIAGNOSIS — Z4789 Encounter for other orthopedic aftercare: Secondary | ICD-10-CM | POA: Diagnosis not present

## 2019-06-08 DIAGNOSIS — K5900 Constipation, unspecified: Secondary | ICD-10-CM | POA: Diagnosis present

## 2019-06-08 DIAGNOSIS — Z91013 Allergy to seafood: Secondary | ICD-10-CM

## 2019-06-08 DIAGNOSIS — C720 Malignant neoplasm of spinal cord: Secondary | ICD-10-CM | POA: Diagnosis not present

## 2019-06-08 DIAGNOSIS — R7401 Elevation of levels of liver transaminase levels: Secondary | ICD-10-CM | POA: Diagnosis not present

## 2019-06-08 DIAGNOSIS — R252 Cramp and spasm: Secondary | ICD-10-CM | POA: Diagnosis not present

## 2019-06-08 DIAGNOSIS — K592 Neurogenic bowel, not elsewhere classified: Secondary | ICD-10-CM | POA: Diagnosis present

## 2019-06-08 DIAGNOSIS — G253 Myoclonus: Secondary | ICD-10-CM | POA: Diagnosis not present

## 2019-06-08 DIAGNOSIS — D72829 Elevated white blood cell count, unspecified: Secondary | ICD-10-CM | POA: Diagnosis not present

## 2019-06-08 DIAGNOSIS — Z87891 Personal history of nicotine dependence: Secondary | ICD-10-CM | POA: Diagnosis not present

## 2019-06-08 DIAGNOSIS — M7989 Other specified soft tissue disorders: Secondary | ICD-10-CM | POA: Diagnosis not present

## 2019-06-08 MED ORDER — ONDANSETRON HCL 4 MG PO TABS
4.0000 mg | ORAL_TABLET | Freq: Four times a day (QID) | ORAL | Status: DC | PRN
  Administered 2019-06-27: 4 mg via ORAL
  Filled 2019-06-08: qty 1

## 2019-06-08 MED ORDER — METHOCARBAMOL 750 MG PO TABS: 750.0000 mg | ORAL_TABLET | Freq: Four times a day (QID) | ORAL | Status: DC | PRN

## 2019-06-08 MED ORDER — BISACODYL 10 MG RE SUPP: 10.0000 mg | Freq: Every day | RECTAL | 0 refills | Status: AC

## 2019-06-08 MED ORDER — LIDOCAINE 5 % EX PTCH
1.0000 | MEDICATED_PATCH | CUTANEOUS | Status: DC
  Administered 2019-06-09 – 2019-06-12 (×4): 1 via TRANSDERMAL
  Filled 2019-06-08 (×4): qty 1

## 2019-06-08 MED ORDER — SENNOSIDES-DOCUSATE SODIUM 8.6-50 MG PO TABS: 2.0000 | ORAL_TABLET | Freq: Every day | ORAL | Status: DC

## 2019-06-08 MED ORDER — ONDANSETRON HCL 4 MG/2ML IJ SOLN: 4.0000 mg | Freq: Four times a day (QID) | INTRAMUSCULAR | Status: DC | PRN

## 2019-06-08 MED ORDER — FLEET ENEMA 7-19 GM/118ML RE ENEM
1.0000 | ENEMA | Freq: Once | RECTAL | Status: AC
  Administered 2019-06-08: 1 via RECTAL
  Filled 2019-06-08: qty 1

## 2019-06-08 MED ORDER — GABAPENTIN 300 MG PO CAPS
600.0000 mg | ORAL_CAPSULE | Freq: Three times a day (TID) | ORAL | Status: DC
  Administered 2019-06-08 – 2019-06-28 (×60): 600 mg via ORAL
  Filled 2019-06-08 (×39): qty 2
  Filled 2019-06-08: qty 1
  Filled 2019-06-08 (×21): qty 2

## 2019-06-08 MED ORDER — BISACODYL 10 MG RE SUPP: 10.0000 mg | Freq: Every day | RECTAL | Status: DC

## 2019-06-08 MED ORDER — GABAPENTIN 300 MG PO CAPS: 600.0000 mg | ORAL_CAPSULE | Freq: Three times a day (TID) | ORAL | Status: DC

## 2019-06-08 MED ORDER — DIAZEPAM 5 MG PO TABS
10.0000 mg | ORAL_TABLET | Freq: Four times a day (QID) | ORAL | Status: DC | PRN
  Administered 2019-06-09 – 2019-06-25 (×27): 10 mg via ORAL
  Administered 2019-06-25: 5 mg via ORAL
  Administered 2019-06-26 – 2019-06-28 (×3): 10 mg via ORAL
  Filled 2019-06-08 (×31): qty 2

## 2019-06-08 MED ORDER — ENOXAPARIN SODIUM 40 MG/0.4ML ~~LOC~~ SOLN
40.0000 mg | SUBCUTANEOUS | Status: DC
  Administered 2019-06-08 – 2019-06-27 (×20): 40 mg via SUBCUTANEOUS
  Filled 2019-06-08 (×20): qty 0.4

## 2019-06-08 MED ORDER — OXYCODONE HCL 5 MG PO TABS
10.0000 mg | ORAL_TABLET | ORAL | Status: DC | PRN
  Administered 2019-06-08 – 2019-06-27 (×13): 10 mg via ORAL
  Filled 2019-06-08 (×14): qty 2

## 2019-06-08 MED ORDER — ACETAMINOPHEN 650 MG RE SUPP
650.0000 mg | RECTAL | Status: DC | PRN
  Filled 2019-06-08: qty 1

## 2019-06-08 MED ORDER — METHOCARBAMOL 750 MG PO TABS
750.0000 mg | ORAL_TABLET | Freq: Four times a day (QID) | ORAL | Status: DC | PRN
  Administered 2019-06-08 – 2019-06-28 (×17): 750 mg via ORAL
  Filled 2019-06-08 (×17): qty 1

## 2019-06-08 MED ORDER — ACETAMINOPHEN 325 MG PO TABS
650.0000 mg | ORAL_TABLET | ORAL | Status: DC | PRN
  Administered 2019-06-09 – 2019-06-27 (×21): 650 mg via ORAL
  Filled 2019-06-08 (×21): qty 2

## 2019-06-08 MED ORDER — SORBITOL 70 % SOLN
30.0000 mL | Freq: Every day | Status: DC | PRN
  Administered 2019-06-22: 30 mL via ORAL
  Filled 2019-06-08: qty 30

## 2019-06-08 MED ORDER — BISACODYL 10 MG RE SUPP
10.0000 mg | Freq: Every day | RECTAL | Status: DC
  Administered 2019-06-08 – 2019-06-10 (×3): 10 mg via RECTAL
  Filled 2019-06-08 (×3): qty 1

## 2019-06-08 MED ORDER — ENOXAPARIN SODIUM 40 MG/0.4ML ~~LOC~~ SOLN: 40.0000 mg | SUBCUTANEOUS | Status: DC

## 2019-06-08 MED ORDER — DIAZEPAM 10 MG PO TABS: 10.0000 mg | ORAL_TABLET | Freq: Four times a day (QID) | ORAL | 0 refills | Status: DC | PRN

## 2019-06-08 MED ORDER — OXYCODONE HCL 5 MG PO TABS
5.0000 mg | ORAL_TABLET | ORAL | Status: DC | PRN
  Administered 2019-06-09: 10 mg via ORAL
  Administered 2019-06-13: 5 mg via ORAL
  Filled 2019-06-08 (×3): qty 2
  Filled 2019-06-08 (×2): qty 1

## 2019-06-08 NOTE — Progress Notes (Signed)
PMR Admission Coordinator Pre-Admission Assessment   Patient: Jacob Lambert is an 34 y.o., adult MRN: [id number] DOB: 07/17/1985 Height: 5' 10.5" (179.1 cm) Weight: 74.1 kg                                                                                                                                                  Insurance Information HMO:     PPO: yes     PCP:      IPA:      80/20:      OTHER:  PRIMARY: [employer] [place]      Policy#: [id number]      Subscriber: pt CM Name: Harrie Jeans       Phone#: [phone]     Fax#: [phone] Pre-Cert#: [id number] auth for CIR provided by Harrie Jeans with [employer]. He will call CSW for updates on 6/23.       Employer: n/a Benefits:  Phone #: [phone]     Name: n/a Eff. Date: 01/06/19     Deduct: $2500 ($0 met)      Out of Pocket Max: $8550 ($0 met)      Life Max: n/a  CIR: 70%     SNF: 70% Outpatient:      Co-Pay: $40/visit Home Health: 70%      Co-Ins: 30% DME: 70%     Co-Ins: 20% Providers: preferred network   SECONDARY: n      Policy#:       Phone#:    Development worker, community:       Phone#:    The Engineer, petroleum" for patients in Inpatient Rehabilitation Facilities with attached "Privacy Act [place] Records" was provided and verbally reviewed with: N/A   Emergency Contact Information         Contact Information     Name Relation Home Work Mobile    [place] Mother [phone]   [phone]    EUGENE, ISADORE Father [phone]   [phone]       Current Medical History  Patient Admitting Diagnosis: cervical intermedullary mass with cord compression    History of Present Illness: Jacob Lambert is a 34 y.o. right-handed adult with history of tobacco abuse, anxiety, depression as well as headaches. Pt's pronouns are them/their. Father is an ED physician at [facility].  Presented 06/01/2019 with progressive ataxia and leg weakness since February 2021.  Recent MRI of the brain negative.  X-rays and imaging  revealed intramedullary cervical spinal cord tumor.  Underwent C6-C7 laminoplasty and resection of intradural intramedullary spinal cord tumor 06/01/2019 per Dr. Venetia Constable.  Decadron protocol as indicated.  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  Medical oncology Dr. Sherrilee Gilles following.  Therapy evaluations completed with recommendations of physical medicine rehab consult.   Past Medical History      Past Medical History:  Diagnosis Date  . Anxiety    .  Depression    . Headache    . Pneumonia      "walking"      Family History  family history is not on file.   Prior Rehab/Hospitalizations:  Has the patient had prior rehab or hospitalizations prior to admission? No   Has the patient had major surgery during 100 days prior to admission? Yes   Current Medications    Current Facility-Administered Medications:  .  0.9 %  sodium chloride infusion, 250 mL, Intravenous, Continuous, Ostergard, Joyice Faster, MD, Stopped at 06/01/19 1930 .  acetaminophen (TYLENOL) tablet 650 mg, 650 mg, Oral, Q4H PRN, 650 mg at 06/07/19 1231 **OR** acetaminophen (TYLENOL) suppository 650 mg, 650 mg, Rectal, Q4H PRN, [name], MD .  bisacodyl (DULCOLAX) suppository 10 mg, 10 mg, Rectal, QHS, Ostergard, Joyice Faster, MD .  Chlorhexidine Gluconate Cloth 2 % PADS 6 each, 6 each, Topical, Daily, [name], MD, 6 each at 06/08/19 0813 .  diazepam (VALIUM) tablet 10 mg, 10 mg, Oral, Q6H PRN, [name], MD, 10 mg at 06/08/19 0438 .  enoxaparin (LOVENOX) injection 40 mg, 40 mg, Subcutaneous, Q24H, Ostergard, Joyice Faster, MD, 40 mg at 06/07/19 1800 .  gabapentin (NEURONTIN) capsule 600 mg, 600 mg, Oral, TID, Erline Levine, MD, 600 mg at 06/08/19 2919 .  HYDROmorphone (DILAUDID) injection 1 mg, 1 mg, Intravenous, Q3H PRN, [name], MD, 1 mg at 06/05/19 1806 .  lactated ringers infusion, , Intravenous, Continuous, Lillia Abed, MD, Last Rate: 10 mL/hr at 06/04/19 1900, Rate Verify at 06/04/19 1900 .   lidocaine (LIDODERM) 5 % 1 patch, 1 patch, Transdermal, Q24H, Erline Levine, MD, 1 patch at 06/07/19 1534 .  menthol-cetylpyridinium (CEPACOL) lozenge 3 mg, 1 lozenge, Oral, PRN **OR** phenol (CHLORASEPTIC) mouth spray 1 spray, 1 spray, Mouth/Throat, PRN, Ostergard, Thomas A, MD .  methocarbamol (ROBAXIN) tablet 750 mg, 750 mg, Oral, Q6H PRN, Erline Levine, MD, 750 mg at 06/08/19 0806 .  ondansetron (ZOFRAN) tablet 4 mg, 4 mg, Oral, Q6H PRN, 4 mg at 06/05/19 2216 **OR** ondansetron (ZOFRAN) injection 4 mg, 4 mg, Intravenous, Q6H PRN, [name], MD, 4 mg at 06/02/19 0345 .  oxyCODONE (Oxy IR/ROXICODONE) immediate release tablet 10 mg, 10 mg, Oral, Q4H PRN, [name], MD, 10 mg at 06/08/19 0439 .  oxyCODONE (Oxy IR/ROXICODONE) immediate release tablet 5-10 mg, 5-10 mg, Oral, Q3H PRN, Erline Levine, MD, 10 mg at 06/08/19 0807 .  polyethylene glycol (MIRALAX / GLYCOLAX) packet 17 g, 17 g, Oral, Daily, [name], MD, 17 g at 06/08/19 0806 .  senna-docusate (Senokot-S) tablet 2 tablet, 2 tablet, Oral, QHS, Ostergard, Thomas A, MD .  sodium chloride flush (NS) 0.9 % injection 3 mL, 3 mL, Intravenous, Q12H, Ostergard, Joyice Faster, MD, 3 mL at 06/08/19 1660 .  sodium chloride flush (NS) 0.9 % injection 3 mL, 3 mL, Intravenous, PRN, [name], MD   Patients Current Diet:     Diet Order                      Diet regular Room service appropriate? Yes with Assist; Fluid consistency: Thin  Diet effective now                   Precautions / Restrictions Precautions Precautions: Fall Precaution Booklet Issued: No Precaution Comments: watch BP Restrictions Weight Bearing Restrictions: No    Has the patient had 2 or more falls or a fall with injury  in the past year?Yes   Prior Activity Level Community (5-7x/wk): previous independent and very active (has his own film distribution company) but had been declining with mobility rapidly over the last 6 months, was  using a SPC to ambulate with difficulty   Prior Functional Level Prior Function Level of Independence: Independent with assistive device(s) Comments: Using SPC. Slipped in shower twice; able to dress self but took longer. Difficulty with shoes/socks. Doing some cleaning/cooking.   Self Care: Did the patient need help bathing, dressing, using the toilet or eating?  Independent   Indoor Mobility: Did the patient need assistance with walking from room to room (with or without device)? Independent   Stairs: Did the patient need assistance with internal or external stairs (with or without device)? Independent   Functional Cognition: Did the patient need help planning regular tasks such as shopping or remembering to take medications? Independent   Home Assistive Devices / Equipment Home Equipment: Other (comment)   Prior Device Use: Indicate devices/aids used by the patient prior to current illness, exacerbation or injury? single point cane   Current Functional Level Cognition   Overall Cognitive Status: Within Functional Limits for tasks assessed Orientation Level: Oriented X4 General Comments: cues to redirect    Extremity Assessment (includes Sensation/Coordination)   Upper Extremity Assessment: Defer to OT evaluation LUE Deficits / Details: limited due to pain/tingling/burning; pt endorses numbness and tingling in digits/hand/wrist, decreased fine motor/coordination LUE: Unable to fully assess due to pain LUE Sensation: decreased light touch LUE Coordination: decreased fine motor, decreased gross motor  Lower Extremity Assessment: LLE deficits/detail, RLE deficits/detail, Generalized weakness RLE Deficits / Details: minimal movement, limited mainly by pain. Able to wiggle toes, No active ankle motion. Highly sensitive to light touch, pins/needles/burning sensation. Good quad activation. Limited knee flexion AROM, able to get to ~80 degrees once EOB passively. RLE Sensation:  decreased light touch LLE Deficits / Details: minimal movement, limited mainly by pain. Able to wiggle toes, minimal active ankle motion. Highly sensitive to light touch, pins/needles/burning sensation. Noted to have some adductor and minimal hamstring activation. Good quad activation. Limited knee flexion AROM, able to get to ~75 degrees once sitting EOB passively. LLE Sensation: decreased light touch, decreased proprioception     ADLs   Overall ADL's : Needs assistance/impaired Eating/Feeding: Set up Eating/Feeding Details (indicate cue type and reason): reports difficulty drinking out of mug; issued pt wide handle mug with good success Grooming: Moderate assistance, Bed level Toilet Transfer: RW, Supervision/safety, Buyer, retail Details (indicate cue type and reason): simulated via stand pivot back to bed; pt prefers dad assist with transfer; close supervision for safety Functional mobility during ADLs: Supervision/safety, Rolling walker, Cueing for safety General ADL Comments: pt making good progress towards OT goals this session. session focus BUE [place] therex and strengthening via theraputty.     Mobility   Overal bed mobility: Needs Assistance Bed Mobility: Rolling, Sidelying to Sit Rolling: Supervision Sidelying to sit: Supervision Sit to sidelying: Mod assist General bed mobility comments: increased time, heavy UE support needed on rail; good technique     Transfers   Overall transfer level: Needs assistance Equipment used: Rolling walker (2 wheeled) Transfer via Lift Equipment: Stedy Transfers: Sit to/from [employer] to Stand: Min assist, From elevated surface Stand pivot transfers: Min guard General transfer comment: cues for hand placement and slight elevation of bed to stand from EOB, cue for controlled stand to sit into recliner     Ambulation / Gait / Stairs /  Wheelchair Mobility   Ambulation/Gait Ambulation/Gait assistance: Min assist, +2  safety/equipment Gait Distance (Feet): 75 Feet(x 3) Assistive device: Rolling walker (2 wheeled) Gait Pattern/deviations: Step-to pattern, Step-through pattern, Decreased stride length, Narrow base of support, Scissoring General Gait Details: at times crossing over and stepping on sock, cues throughout for wider BOS; note some hip extension in stance due to core weakness and L LE weaker more episodes of knee buckling on L than R, noted also medial/lateral hip instability Gait velocity interpretation: <1.8 ft/sec, indicate of risk for recurrent falls     Posture / Balance Dynamic Sitting Balance Sitting balance - Comments: sat with hands on his knees, initially leaning and minguard for balance, progressed to S over time with improved foot support on floor Balance Overall balance assessment: Needs assistance Sitting-balance support: Feet supported Sitting balance-Leahy Scale: Fair Sitting balance - Comments: sat with hands on his knees, initially leaning and minguard for balance, progressed to S over time with improved foot support on floor Postural control: Posterior lean Standing balance support: Bilateral upper extremity supported Standing balance-Leahy Scale: Poor Standing balance comment: reliant on BUE support, but knees not buckling in static stance     Special needs/care consideration Skin surgical incision to posterior neck (lower cervical upper thoracic), Special service needs please see additional info below and Designated visitor Dr. Delora Fuel and [place] (from acute therapy documentation) Living Arrangements: Parent Available Help at Discharge: Family, Available PRN/intermittently(Dad is MD at [facility] [employer] and mom is a Marine scientist) Type of Home: [place]: One level, Laundry or work area in basement(20 stairs to get to basement but able to live on main level at d/c) Home Access: Stairs to enter [employer]: (sort of  handrail) Entrance Stairs-Number of Steps: 2 from garage Bathroom Shower/Tub: Chiropodist: Standard Additional Comments: tens machine   Discharge Living Setting Plans for Discharge Living Setting: Lives with (comment)(parents for now) Type of Home at Discharge: House Discharge Home Layout: Multi-level, Able to live on main level with bedroom/bathroom, Bed/bath upstairs(option for basement apartment or mainfloor bed/bath) Alternate Level Stairs-Rails: Right, Left(left side to basement is a 1/2 rail) Alternate Level Stairs-Number of Steps: full flight Discharge Home Access: Stairs to enter Entrance Stairs-Rails: None(can work on adding one if needed) Technical brewer of Steps: 2 through garage Discharge Bathroom Shower/Tub: Tub/shower unit, Walk-in shower, Tub only(tub/shower in basement, walk in and tub upstairs) Discharge Bathroom Toilet: Standard Discharge Bathroom Accessibility: (1/2 bath may be acc with RW, otherwise no) Does the patient have any problems obtaining your medications?: No   Social/Family/Support Systems Anticipated Caregiver: parents (Melodie and Dr. Delora Fuel) Anticipated Caregiver's Contact Information: Melodie [phone], Dr. Roxanne Mins [phone] Ability/Limitations of Caregiver: Dr. Roxanne Mins works nights in the ED, Melodie can provide intermittent supervision Caregiver Availability: 24/7 Discharge Plan Discussed with Primary Caregiver: Yes Is Caregiver In Agreement with Plan?: Yes Does Caregiver/Family have Issues with Lodging/Transportation while Pt is in Rehab?: No     Goals Patient/Family Goal for Rehab: PT/OT mod I to supervision Expected length of stay: 3+ weeks Cultural Considerations: pt is gender non-binary, pronouns are "they/them"  Pt/Family Agrees to Admission and willing to participate: Yes Program Orientation Provided & Reviewed with Pt/Caregiver Including Roles  & Responsibilities: Yes     Decrease burden of Care  through IP rehab admission: n/a   Possible need for SNF placement upon discharge: Not anticipated.    Patient Condition: This patient's medical and functional  status has changed since the consult dated: 06/06/2019 in which the Rehabilitation Physician determined and documented that the patient's condition is appropriate for intensive rehabilitative care in an inpatient rehabilitation facility. See "History of Present Illness" (above) for medical update. Functional changes are: min assist with chair follow for gait up to 75'. Patient's medical and functional status update has been discussed with the Rehabilitation physician and patient remains appropriate for inpatient rehabilitation. Will admit to inpatient rehab today.   Preadmission Screen Completed By:  Michel Santee, PT, DPT 06/08/2019 10:47 AM ______________________________________________________________________   Discussed status with Dr. Dagoberto Ligas on 06/08/19 at 11:00 AM  and received approval for admission today.   Admission Coordinator:  Michel Santee, PT, DPT time 11:00 AM Sudie Grumbling 06/08/19              Cosigned by: Courtney Heys, MD at 06/08/2019 11:01 AM

## 2019-06-08 NOTE — H&P (Addendum)
Physical Medicine and Rehabilitation Admission H&P     HPI: Jacob Lambert is a 34 year old right-handed individual with history of tobacco abuse, anxiety depression as well as headaches.  Per chart review patient is presently living with their parents although they does have longer term residencies both in [place] and [place].  1 level home 2 steps to entry independent with assistive device.  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  Father is an ED physician at [employer] [facility] [employer] [facility].  Presented 06/01/2019 with progressive ataxia and leg weakness since February 2021.  Recent MRI of the brain negative.  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  Patient has been followed by both Dr. Cecil Cobbs medical oncology as well as neurosurgery.  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 Venetia Constable.  Decadron protocol as indicated.  Final pathology grade 2 ependyoma.  Lovenox initiated for DVT prophylaxis 06/06/2019.  Pain control with the use of scheduled Neurontin 600 mg 3 times daily as well as Lidoderm patches.  They have been using Robaxin and Valium for muscle spasms as needed as well as oxycodone for breakthrough pain.  Bouts of constipation he did receive magnesium citrate with good results.  Therapy evaluations completed and patient was admitted for a comprehensive rehab program.  Pt reports had very large BM today with enema- however only 2 BMs since hospital admission- can't close/open sphincter, so sounds like needs bowel program with dig stim.   PVRs minimal- so doesn't need anymore.    Review of Systems  Constitutional: Negative for chills and fever.  HENT: Negative for hearing loss.   Eyes: Negative for blurred vision and double vision.  Respiratory: Negative for cough and shortness of breath.   Cardiovascular: Negative for chest  pain and leg swelling.  Gastrointestinal: Positive for constipation. Negative for heartburn, nausea and vomiting.  Genitourinary: Negative for dysuria, flank pain and hematuria.  Musculoskeletal: Positive for myalgias.  Skin: Negative for rash.  Neurological: Positive for sensory change, weakness and headaches.  Psychiatric/Behavioral: Positive for depression.       Anxiety  All other systems reviewed and are negative.  Past Medical History:  Diagnosis Date  . Anxiety   . Depression   . Headache   . Pneumonia    "walking"   Past Surgical History:  Procedure Laterality Date  . LAMINECTOMY N/A 06/01/2019   Procedure: Cervical six-seven Laminoplasty, biopsy and resection of intramedullary spinal cord tumor;  Surgeon: [name], MD;  Location: [facility];  Service: Neurosurgery;  Laterality: N/A;  . NO PAST SURGERIES     History reviewed. No pertinent family history. Social History:  reports that Jagar Walles has quit smoking. Tade Ostertag has never used smokeless tobacco. [name] reports current alcohol use. [name] reports previous drug use. Allergies:  Allergies  Allergen Reactions  . Cephalosporins Hives  . Other     Patagonion toothfish   Medications Prior to Admission  Medication Sig Dispense Refill  . bisacodyl (DULCOLAX) 10 MG suppository Place 1 suppository (10 mg total) rectally at bedtime. 12 suppository 0  . diazepam (VALIUM) 10 MG tablet Take 1 tablet (10 mg total) by mouth every 6 (six) hours as needed for muscle spasms. 30 tablet 0  . gabapentin (NEURONTIN) 300 MG capsule Take 2 capsules (600 mg total) by mouth 3 (three) times daily.    Marland Kitchen glucosamine-chondroitin 500-400 MG  tablet Take 1 tablet by mouth 3 (three) times daily.    . methocarbamol (ROBAXIN) 750 MG tablet Take 1 tablet (750 mg total) by mouth every 6 (six) hours as needed for muscle spasms.    . Multiple Vitamin (MULTIVITAMIN WITH MINERALS) TABS tablet Take 1 tablet by mouth daily.    Marland Kitchen  senna-docusate (SENOKOT-S) 8.6-50 MG tablet Take 2 tablets by mouth at bedtime.      Drug Regimen Review Drug regimen was reviewed and remains appropriate with no significant issues identified  Home: Home Living Family/patient expects to be discharged to:: Private residence Living Arrangements: Parent   Functional History:    Functional Status:  Mobility:          ADL:    Cognition: Cognition Orientation Level: Oriented X4    Physical Exam: Blood pressure 121/84, pulse 86, temperature 98.1 F (36.7 C), resp. rate 18, height 5' 10.5" (1.791 m), SpO2 100 %. Physical Exam  Nursing note and vitals reviewed. Constitutional: Jacob Lambert is oriented to person, place, and time.  Awake, alert, sitting up in bed after seen by PT; father at bedside, NAD  HENT:  Head: Normocephalic.  Nose: Nose normal.  Mouth/Throat: Oropharynx is clear and moist. No oropharyngeal exudate.  Tongue midline and smile equal  Eyes: Conjunctivae are normal. Right eye exhibits no discharge. Left eye exhibits no discharge. No scleral icterus.  Neck:  Posterior neck incision- looks great- staples intact- no erythema; no drainage  Cardiovascular:  RRR- no JVD  Respiratory:  CTA B/L- no W/R/R- good air movement   GI:  Soft, NT, ND, (+)BS  Hypoactive BS  Musculoskeletal:     Cervical back: Neck supple.     Comments: UE- biceps 5/5, WE 5-/5, triceps 4/5, grip 3+/5, finger abd 3/5 2 days ago- R and L UEs were different- today they are the same on exam RLE- HF 3-/5, KE 3+/5, DF 4/5, PF 4/5, EHL 4/5 LLE- HF 3/5, KE 4/5, DF 5/5, PF 5/5, EHL 4+/5  Neurological: Jacob Lambert is alert and oriented to person, place, and time.  Patient is alert in no acute distress sitting up in bed oriented x3 and follows commands.  2 beats clonus in LEs- slightly more normal- no hoffman's B/L in UEs Sensory LT no significant change since done 2 days ago- checked EVERY dermatome at that time- per d/w pt, no change in  sensation No increased tone in UEs or LEs B/L  Skin: Skin is warm and dry.  Neck incision as previously mentioned  Psychiatric:  frustrated    Results for orders placed or performed during the hospital encounter of 06/01/19 (from the past 48 hour(s))  Glucose, capillary     Status: Abnormal   Collection Time: 06/06/19  9:46 PM  Result Value Ref Range   Glucose-Capillary 190 (H) 70 - 99 mg/dL    Comment: Glucose reference range applies only to samples taken after fasting for at least 8 hours.  Glucose, capillary     Status: Abnormal   Collection Time: 06/07/19  7:29 AM  Result Value Ref Range   Glucose-Capillary 121 (H) 70 - 99 mg/dL    Comment: Glucose reference range applies only to samples taken after fasting for at least 8 hours.   No results found.     Medical Problem List and Plan: 1.  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  Status post C6-7 laminoplasty resection of spinal cord  tumor 06/01/2019.  Decadron taper completed  -patient may  Shower if they cover neck with water occlusive dressing  -ELOS/Goals: ~ 3 weeks- goals mod I ot supervision 2.  Antithrombotics: -DVT/anticoagulation: Lovenox.  Check vascular study- will need for 2-3 months depending on walking ability-   -antiplatelet therapy: N/A 3. Pain Management: Neurontin 600 mg 3 times daily, Lidoderm patch as directed, Valium 10 mg every 6 hours as needed muscle spasms, Robaxin 750 mg every 6 hours as needed muscle spasms, oxycodone as needed 4. Mood: - don't think pt on meds- will add if needed  -antipsychotic agents: N/A 5. Neuropsych: This patient is capable of making decisions on their own behalf. 6. Skin/Wound Care: Routine skin checks 7. Fluids/Electrolytes/Nutrition: Routine in and outs with follow-up chemistries 8.  Constipation in setting of Neurogenic bowel.  -add suppository nightly and digital stimulation of rectum to  improve constipation and bowel movements- if recovers ability to have BMs on his own, will stop.  9. Minimal PVRs- will not reorder PVRs- voiding OK-  10. Spasticity- explained it's normal that it gets worse over time- is very mild right now- will monitor Will check to see if needs Robaxin- is not great for spasticity- might be for muscle spasms of neck- if not, will stop and start Baclofen.   Lavon Paganini. [place], PA-C 06/08/2019   I have personally performed a face to face diagnostic evaluation of this patient and formulated the key components of the plan.  Additionally, I have personally reviewed laboratory data, imaging studies, as well as relevant notes and concur with the physician assistant's documentation above.   The patient's status has not changed from the original H&P.  Any changes in documentation from the acute care chart have been noted above.     Courtney Heys, MD 06/08/2019

## 2019-06-08 NOTE — Progress Notes (Signed)
Administered dulcolax suppository and dig stem x 2 with no results. No stool noted in rectal vault.

## 2019-06-08 NOTE — Progress Notes (Signed)
Patient resting much better now after receiving mag citrate to help with BM. Patient had 58ml PVR  After voiding 600 ml this am. Patient states he feels good after meds given and is ready to sleep.  Patient states he hopes to have bowel movement tomorrow that is now so traumatic as this past Saturday.

## 2019-06-08 NOTE — PMR Pre-admission (Signed)
PMR Admission Coordinator Pre-Admission Assessment  Patient: Jacob Lambert is an 34 y.o., adult MRN: [id number] DOB: Aug 28, 1985 Height: 5' 10.5" (179.1 cm) Weight: 74.1 kg              Insurance Information HMO:     PPO: yes     PCP:      IPA:      80/20:      OTHER:  PRIMARY: [employer] [place]      Policy#: [id number]      Subscriber: pt CM Name: Harrie Jeans       Phone#: [phone]     Fax#: [phone] Pre-Cert#: [id number] auth for CIR provided by Harrie Jeans with [place]. He will call CSW for updates on 6/23.       Employer: n/a Benefits:  Phone #: [phone]     Name: n/a Eff. Date: 01/06/19     Deduct: $2500 ($0 met)      Out of Pocket Max: $8550 ($0 met)      Life Max: n/a  CIR: 70%     SNF: 70% Outpatient:      Co-Pay: $40/visit Home Health: 70%      Co-Ins: 30% DME: 70%     Co-Ins: 20% Providers: preferred network   SECONDARY: n      Policy#:       Phone#:   Development worker, community:       Phone#:   The Engineer, petroleum" for patients in Inpatient Rehabilitation Facilities with attached "Privacy Act [place] Records" was provided and verbally reviewed with: N/A  Emergency Contact Information Contact Information    Name Relation Home Work Mobile   [place] Mother [phone]  [phone]   LYALL, FACIANE Father [phone]  [phone]     Current Medical History  Patient Admitting Diagnosis: cervical intermedullary mass with cord compression   History of Present Illness: Jacob Lambert is a 34 y.o. right-handed adult with history of tobacco abuse, anxiety, depression as well as headaches. Pt's pronouns are them/their. Father is an ED physician at [facility] [employer].  Presented 06/01/2019 with progressive ataxia and leg weakness since February 2021.  Recent MRI of the brain negative.  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  Underwent C6-C7 laminoplasty and resection of intradural intramedullary spinal cord tumor 06/01/2019 per  Dr. Venetia Constable.  Decadron protocol as indicated.  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  Medical oncology Dr. Sherrilee Gilles following.  Therapy evaluations completed with recommendations of physical medicine rehab consult.    Past Medical History  Past Medical History:  Diagnosis Date  . Anxiety   . Depression   . Headache   . Pneumonia    "walking"    Family History  family history is not on file.  Prior Rehab/Hospitalizations:  Has the patient had prior rehab or hospitalizations prior to admission? No  Has the patient had major surgery during 100 days prior to admission? Yes  Current Medications   Current Facility-Administered Medications:  .  0.9 %  sodium chloride infusion, 250 mL, Intravenous, Continuous, Ostergard, Joyice Faster, MD, Stopped at 06/01/19 1930 .  acetaminophen (TYLENOL) tablet 650 mg, 650 mg, Oral, Q4H PRN, 650 mg at 06/07/19 1231 **OR** acetaminophen (TYLENOL) suppository 650 mg, 650 mg, Rectal, Q4H PRN, [name], MD .  bisacodyl (DULCOLAX) suppository 10 mg, 10 mg, Rectal, QHS, Ostergard, Joyice Faster, MD .  Chlorhexidine Gluconate Cloth 2 % PADS 6 each, 6 each, Topical, Daily, [name], MD, 6 each at 06/08/19 0813 .  diazepam (VALIUM) tablet 10 mg, 10 mg, Oral, Q6H PRN, [name], MD, 10 mg at 06/08/19 0438 .  enoxaparin (LOVENOX) injection 40 mg, 40 mg, Subcutaneous, Q24H, Ostergard, Joyice Faster, MD, 40 mg at 06/07/19 1800 .  gabapentin (NEURONTIN) capsule 600 mg, 600 mg, Oral, TID, Erline Levine, MD, 600 mg at 06/08/19 2409 .  HYDROmorphone (DILAUDID) injection 1 mg, 1 mg, Intravenous, Q3H PRN, [name], MD, 1 mg at 06/05/19 1806 .  lactated ringers infusion, , Intravenous, Continuous, Lillia Abed, MD, Last Rate: 10 mL/hr at 06/04/19 1900, Rate Verify at 06/04/19 1900 .  lidocaine (LIDODERM) 5 % 1 patch, 1 patch, Transdermal, Q24H, Erline Levine, MD, 1 patch at 06/07/19 1534 .  menthol-cetylpyridinium (CEPACOL) lozenge 3 mg, 1 lozenge,  Oral, PRN **OR** phenol (CHLORASEPTIC) mouth spray 1 spray, 1 spray, Mouth/Throat, PRN, Ostergard, Thomas A, MD .  methocarbamol (ROBAXIN) tablet 750 mg, 750 mg, Oral, Q6H PRN, Erline Levine, MD, 750 mg at 06/08/19 0806 .  ondansetron (ZOFRAN) tablet 4 mg, 4 mg, Oral, Q6H PRN, 4 mg at 06/05/19 2216 **OR** ondansetron (ZOFRAN) injection 4 mg, 4 mg, Intravenous, Q6H PRN, [name], MD, 4 mg at 06/02/19 0345 .  oxyCODONE (Oxy IR/ROXICODONE) immediate release tablet 10 mg, 10 mg, Oral, Q4H PRN, [name], MD, 10 mg at 06/08/19 0439 .  oxyCODONE (Oxy IR/ROXICODONE) immediate release tablet 5-10 mg, 5-10 mg, Oral, Q3H PRN, Erline Levine, MD, 10 mg at 06/08/19 0807 .  polyethylene glycol (MIRALAX / GLYCOLAX) packet 17 g, 17 g, Oral, Daily, [name], MD, 17 g at 06/08/19 0806 .  senna-docusate (Senokot-S) tablet 2 tablet, 2 tablet, Oral, QHS, Ostergard, Thomas A, MD .  sodium chloride flush (NS) 0.9 % injection 3 mL, 3 mL, Intravenous, Q12H, Ostergard, Joyice Faster, MD, 3 mL at 06/08/19 7353 .  sodium chloride flush (NS) 0.9 % injection 3 mL, 3 mL, Intravenous, PRN, [name], MD  Patients Current Diet:  Diet Order            Diet regular Room service appropriate? Yes with Assist; Fluid consistency: Thin  Diet effective now              Precautions / Restrictions Precautions Precautions: Fall Precaution Booklet Issued: No Precaution Comments: watch BP Restrictions Weight Bearing Restrictions: No   Has the patient had 2 or more falls or a fall with injury in the past year?Yes  Prior Activity Level Community (5-7x/wk): previous independent and very active (has his own film distribution company) but had been declining with mobility rapidly over the last 6 months, was using a SPC to ambulate with difficulty  Prior Functional Level Prior Function Level of Independence: Independent with assistive device(s) Comments: Using SPC. Slipped in shower twice; able  to dress self but took longer. Difficulty with shoes/socks. Doing some cleaning/cooking.  Self Care: Did the patient need help bathing, dressing, using the toilet or eating?  Independent  Indoor Mobility: Did the patient need assistance with walking from room to room (with or without device)? Independent  Stairs: Did the patient need assistance with internal or external stairs (with or without device)? Independent  Functional Cognition: Did the patient need help planning regular tasks such as shopping or remembering to take medications? Independent  Home Assistive Devices / Equipment Home Equipment: Other (comment)  Prior Device Use: Indicate devices/aids used by the patient prior to current illness, exacerbation or injury? single point cane  Current Functional Level Cognition  Overall Cognitive  Status: Within Functional Limits for tasks assessed Orientation Level: Oriented X4 General Comments: cues to redirect    Extremity Assessment (includes Sensation/Coordination)  Upper Extremity Assessment: Defer to OT evaluation LUE Deficits / Details: limited due to pain/tingling/burning; pt endorses numbness and tingling in digits/hand/wrist, decreased fine motor/coordination LUE: Unable to fully assess due to pain LUE Sensation: decreased light touch LUE Coordination: decreased fine motor, decreased gross motor  Lower Extremity Assessment: LLE deficits/detail, RLE deficits/detail, Generalized weakness RLE Deficits / Details: minimal movement, limited mainly by pain. Able to wiggle toes, No active ankle motion. Highly sensitive to light touch, pins/needles/burning sensation. Good quad activation. Limited knee flexion AROM, able to get to ~80 degrees once EOB passively. RLE Sensation: decreased light touch LLE Deficits / Details: minimal movement, limited mainly by pain. Able to wiggle toes, minimal active ankle motion. Highly sensitive to light touch, pins/needles/burning sensation. Noted to  have some adductor and minimal hamstring activation. Good quad activation. Limited knee flexion AROM, able to get to ~75 degrees once sitting EOB passively. LLE Sensation: decreased light touch, decreased proprioception    ADLs  Overall ADL's : Needs assistance/impaired Eating/Feeding: Set up Eating/Feeding Details (indicate cue type and reason): reports difficulty drinking out of mug; issued pt wide handle mug with good success Grooming: Moderate assistance, Bed level Toilet Transfer: RW, Supervision/safety, Buyer, retail Details (indicate cue type and reason): simulated via stand pivot back to bed; pt prefers dad assist with transfer; close supervision for safety Functional mobility during ADLs: Supervision/safety, Rolling walker, Cueing for safety General ADL Comments: pt making good progress towards OT goals this session. session focus BUE [place] therex and strengthening via theraputty.    Mobility  Overal bed mobility: Needs Assistance Bed Mobility: Rolling, Sidelying to Sit Rolling: Supervision Sidelying to sit: Supervision Sit to sidelying: Mod assist General bed mobility comments: increased time, heavy UE support needed on rail; good technique    Transfers  Overall transfer level: Needs assistance Equipment used: Rolling walker (2 wheeled) Transfer via Lift Equipment: Stedy Transfers: Sit to/from [employer] to Stand: Min assist, From elevated surface Stand pivot transfers: Min guard General transfer comment: cues for hand placement and slight elevation of bed to stand from EOB, cue for controlled stand to sit into recliner    Ambulation / Gait / Stairs / Wheelchair Mobility  Ambulation/Gait Ambulation/Gait assistance: Min assist, +2 safety/equipment Gait Distance (Feet): 75 Feet(x 3) Assistive device: Rolling walker (2 wheeled) Gait Pattern/deviations: Step-to pattern, Step-through pattern, Decreased stride length, Narrow base of support, Scissoring General Gait  Details: at times crossing over and stepping on sock, cues throughout for wider BOS; note some hip extension in stance due to core weakness and L LE weaker more episodes of knee buckling on L than R, noted also medial/lateral hip instability Gait velocity interpretation: <1.8 ft/sec, indicate of risk for recurrent falls    Posture / Balance Dynamic Sitting Balance Sitting balance - Comments: sat with hands on his knees, initially leaning and minguard for balance, progressed to S over time with improved foot support on floor Balance Overall balance assessment: Needs assistance Sitting-balance support: Feet supported Sitting balance-Leahy Scale: Fair Sitting balance - Comments: sat with hands on his knees, initially leaning and minguard for balance, progressed to S over time with improved foot support on floor Postural control: Posterior lean Standing balance support: Bilateral upper extremity supported Standing balance-Leahy Scale: Poor Standing balance comment: reliant on BUE support, but knees not buckling in static stance    Special needs/care  consideration Skin surgical incision to posterior neck (lower cervical upper thoracic), Special service needs please see additional info below and Designated visitor Dr. Delora Fuel and [place] (from acute therapy documentation) Living Arrangements: Parent Available Help at Discharge: Family, Available PRN/intermittently(Dad is MD at [employer] [facility] and mom is a nurse) Type of Home: [place]: One level, Laundry or work area in basement(20 stairs to get to basement but able to live on main level at d/c) Home Access: Stairs to enter [employer]: (sort of handrail) Entrance Stairs-Number of Steps: 2 from garage Bathroom Shower/Tub: Chiropodist: Standard Additional Comments: tens [place] for Discharge Living Setting: Lives with (comment)(parents for  now) Type of Home at Discharge: House Discharge Home Layout: Multi-level, Able to live on main level with bedroom/bathroom, Bed/bath upstairs(option for basement apartment or mainfloor bed/bath) Alternate Level Stairs-Rails: Right, Left(left side to basement is a 1/2 rail) Alternate Level Stairs-Number of Steps: full flight Discharge Home Access: Stairs to enter Entrance Stairs-Rails: None(can work on adding one if needed) Technical brewer of Steps: 2 through garage Discharge Bathroom Shower/Tub: Tub/shower unit, Walk-in shower, Tub only(tub/shower in basement, walk in and tub upstairs) Discharge Bathroom Toilet: Standard Discharge Bathroom Accessibility: (1/2 bath may be acc with RW, otherwise no) Does the patient have any problems obtaining your medications?: No  Social/Family/Support Systems Anticipated Caregiver: parents (Melodie and Dr. Delora Fuel) Anticipated Caregiver's Contact Information: Melodie [phone], Dr. Roxanne Mins [phone] Ability/Limitations of Caregiver: Dr. Roxanne Mins works nights in the ED, Melodie can provide intermittent supervision Caregiver Availability: 24/7 Discharge Plan Discussed with Primary Caregiver: Yes Is Caregiver In Agreement with Plan?: Yes Does Caregiver/Family have Issues with Lodging/Transportation while Pt is in Rehab?: No   Goals Patient/Family Goal for Rehab: PT/OT mod I to supervision Expected length of stay: 3+ weeks Cultural Considerations: pt is gender non-binary, pronouns are "they/them"  Pt/Family Agrees to Admission and willing to participate: Yes Program Orientation Provided & Reviewed with Pt/Caregiver Including Roles  & Responsibilities: Yes   Decrease burden of Care through IP rehab admission: n/a  Possible need for SNF placement upon discharge: Not anticipated.   Patient Condition: This patient's medical and functional status has changed since the consult dated: 06/06/2019 in which the Rehabilitation Physician determined and  documented that the patient's condition is appropriate for intensive rehabilitative care in an inpatient rehabilitation facility. See "History of Present Illness" (above) for medical update. Functional changes are: min assist with chair follow for gait up to 75'. Patient's medical and functional status update has been discussed with the Rehabilitation physician and patient remains appropriate for inpatient rehabilitation. Will admit to inpatient rehab today.  Preadmission Screen Completed By:  Michel Santee, PT, DPT 06/08/2019 10:47 AM ______________________________________________________________________   Discussed status with Dr. Dagoberto Ligas on 06/08/19 at 11:00 AM  and received approval for admission today.  Admission Coordinator:  Michel Santee, PT, DPT time 11:00 AM Sudie Grumbling 06/08/19

## 2019-06-08 NOTE — Progress Notes (Signed)
Physical Therapy Treatment Patient Details Name: Jacob Lambert MRN: [id number] DOB: [birth year] Today's Date: 06/08/2019    History of Present Illness Patient is a 34 y/o male who presents with a cervical lesion and cyst s/pC6-7 laminoplasty, biopsy and resection of spinal cord tumor 5/27. PMH includes depression, anxiety, HA.    PT Comments    Patient progressing with mobility able to walk lap in hallway, but with fatigue, L knee lateral stress, R foot flat at IC and extra work for hip activation for stance stability on L.  Patient eager for rehab and remains appropriate.  Follow up CIR level rehab appropriate.    Follow Up Recommendations  CIR;Supervision for mobility/OOB;Supervision/Assistance - 24 hour     Equipment Recommendations  Other (comment)(TBA)    Recommendations for Other Services       Precautions / Restrictions Precautions Precautions: Fall    Mobility  Bed Mobility Overal bed mobility: Needs Assistance Bed Mobility: Rolling;Sidelying to Sit Rolling: Supervision Sidelying to sit: Supervision     Sit to sidelying: Min guard General bed mobility comments: UE support and rail use with increased time, legs back to bed with bilateral lift and core activation  Transfers Overall transfer level: Needs assistance Equipment used: Rolling walker (2 wheeled) Transfers: Sit to/from Stand Sit to Stand: Min guard         General transfer comment: cues for hand placement and slight elevation of bed to stand from EOB, cue for controlled stand to sit into recliner  Ambulation/Gait Ambulation/Gait assistance: Min assist;Min guard Gait Distance (Feet): 150 Feet Assistive device: Rolling walker (2 wheeled) Gait Pattern/deviations: Step-to pattern;Step-through pattern;Decreased stride length;Narrow base of support;Decreased dorsiflexion - right     General Gait Details: cues for feet apart, assist for balance, walker safety   Stairs             Wheelchair  Mobility    Modified Rankin (Stroke Patients Only)       Balance Overall balance assessment: Needs assistance Sitting-balance support: Feet supported Sitting balance-Leahy Scale: Fair     Standing balance support: Bilateral upper extremity supported Standing balance-Leahy Scale: Poor Standing balance comment: B UE support for balance                            Cognition Arousal/Alertness: Awake/alert Behavior During Therapy: WFL for tasks assessed/performed Overall Cognitive Status: Within Functional Limits for tasks assessed                                        Exercises Other Exercises Other Exercises: supine bridge with 5 sec hold x 10, sidelying clamshell x 10 each side mod cues, seated trunk flex/ext with A for ROM, seated hip flexion    General Comments General comments (skin integrity, edema, etc.): Patient expressing concerns regarding sexual function and seemed to have discussed with PMR MD.  Also tearful about progress and eager for CIR.      Pertinent Vitals/Pain Pain Assessment: Faces Faces Pain Scale: Hurts even more Pain Location: L leg Pain Descriptors / Indicators: Aching Pain Intervention(s): Monitored during session;RN gave pain meds during session    Home Living                      Prior Function            PT Goals (current goals  can now be found in the care plan section) Progress towards PT goals: Progressing toward goals    Frequency    Min 5X/week      PT Plan Current plan remains appropriate    Co-evaluation              AM-PAC PT "6 Clicks" Mobility   Outcome Measure  Help needed turning from your back to your side while in a flat bed without using bedrails?: A Little Help needed moving from lying on your back to sitting on the side of a flat bed without using bedrails?: A Little Help needed moving to and from a bed to a chair (including a wheelchair)?: A Little Help needed  standing up from a chair using your arms (e.g., wheelchair or bedside chair)?: A Little Help needed to walk in hospital room?: A Little Help needed climbing 3-5 steps with a railing? : A Lot 6 Click Score: 17    End of Session Equipment Utilized During Treatment: Gait belt Activity Tolerance: Patient tolerated treatment well Patient left: in bed;with call bell/phone within reach   PT Visit Diagnosis: Pain;Muscle weakness (generalized) (M62.81);Other abnormalities of gait and mobility (R26.89) Pain - Right/Left: Left Pain - part of body: Leg     Time: [id number] PT Time Calculation (min) (ACUTE ONLY): 45 min  Charges:  $Gait Training: 8-22 mins $Therapeutic Exercise: 8-22 mins $Therapeutic Activity: 8-22 mins                     Magda Kiel, Jacob Lambert [phone] 06/08/2019    Jacob Lambert 06/08/2019, 7:43 PM

## 2019-06-08 NOTE — Discharge Instructions (Signed)
Discharge Instructions  No restriction in activities, slowly increase your activity back to normal.   Your incision is closed with absorbable sutures. These will naturally fall off over the next 4-6 weeks. If they become bothersome or cause discomfort, apply some antibiotic ointment like bacitracin or neosporin on the sutures. This will soften them up and usually makes them more comfortable while they dissolve.  Okay to shower on the day of discharge. Be gentle when cleaning your incision. Use regular soap and water. If that is uncomfortable, try using baby shampoo. Do not submerge the wound under water for 2 weeks after surgery.  Follow up with Dr. Jahrel Borthwick in 2 weeks after discharge. If you do not already have a discharge appointment, please call his office at [phone] to schedule a follow up appointment. If you have any concerns or questions, please call the office and let us know. 

## 2019-06-08 NOTE — Progress Notes (Signed)
Physical Medicine and Rehabilitation Consult Reason for Consult: Progressive ataxia Referring Physician: Dr. Venetia Constable     HPI: Jacob Lambert is a 34 y.o. right-handed adult male with history of tobacco abuse, anxiety depression as well as headaches.  Per chart review patient lives with parents.  1 level home 2 steps to entry.  Independent with assistive device.  Father is an ED physician at [facility].  Presented 06/01/2019 with progressive ataxia and leg weakness since February 2021.  Recent MRI of the brain negative.  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 Venetia Constable.  Decadron protocol as indicated.  Pathology report pending.  Medical oncology Dr. Sherrilee Gilles is also involved.  Therapy evaluations completed with recommendations of physical medicine rehab consult.     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  Voiding, but PVRS haven't been checked that family is aware of Was impacted with bowel- got "cleaned out"   Review of Systems  Constitutional: Negative for fever.  HENT: Negative for hearing loss.   Eyes: Negative for blurred vision and double vision.  Respiratory: Negative for cough and shortness of breath.   Cardiovascular: Negative for chest pain, palpitations and leg swelling.  Gastrointestinal: Positive for constipation. Negative for heartburn and nausea.  Genitourinary: Negative for dysuria, flank pain and hematuria.  Musculoskeletal: Positive for myalgias.  Skin: Negative for rash.  Neurological: Positive for sensory change, weakness and headaches.  Psychiatric/Behavioral: Positive for depression.       Anxiety  All other systems reviewed and are negative.       Past Medical History:  Diagnosis Date  . Anxiety     . Depression    . Headache    . Pneumonia      "walking"         Past Surgical History:  Procedure Laterality Date  . LAMINECTOMY N/A 06/01/2019    Procedure: Cervical six-seven Laminoplasty, biopsy and resection of intramedullary spinal cord tumor;  Surgeon: [name], MD;  Location: [facility];  Service: Neurosurgery;  Laterality: N/A;  . NO PAST SURGERIES        History reviewed. No pertinent family history. Social History:  reports that Dayvian Niemeyer has quit smoking. Zamauri Shertzer has never used smokeless tobacco. [name] reports current alcohol use. Patricik Bowlin reports previous drug use. Allergies:       Allergies  Allergen Reactions  . Cephalosporins Hives  . Other        Patagonion toothfish          Medications Prior to Admission  Medication Sig Dispense Refill  . acetaminophen (TYLENOL) 500 MG tablet Take 500 mg by mouth every 6 (six) hours as needed.      Marland Kitchen dexamethasone (DECADRON) 4 MG tablet Take 1 tablet (4 mg total) by mouth daily. (Patient taking differently: Take 4-8 mg by mouth See admin instructions. Take 2 tablets (8 mg) by mouth on 05/21 & 05/22, then reduce to 1 tablet (4 mg) by mouth daily thereafter.) 30 tablet 0  . glucosamine-chondroitin 500-400 MG tablet Take 1 tablet by mouth 3 (three) times daily.      . methocarbamol (ROBAXIN) 500 MG tablet Take 500 mg by mouth 2 (two) times daily as needed (spasms.).       Marland Kitchen  Multiple Vitamin (MULTIVITAMIN WITH MINERALS) TABS tablet Take 1 tablet by mouth daily.      . naproxen sodium (ALEVE) 220 MG tablet Take 220-440 mg by mouth 2 (two) times daily as needed (pain.).          Home: Home Living Family/patient expects to be discharged to:: Private residence Living Arrangements: Parent Available Help at Discharge: Family, Available PRN/intermittently(Dad is MD at [employer] and mom is a Marine scientist) Type of Home: House Home Access: Stairs to enter [employer] of Steps: 2 from garage Entrance Stairs-Rails:  (sort of handrail) Home Layout: One level, Laundry or work area in basement(20 stairs to get to basement but able to live on main level at d/c) Bathroom Shower/Tub: Chiropodist: Standard Home Equipment: Other (comment) Additional Comments: tens machine  Functional History: Prior Function Level of Independence: Independent with assistive device(s) Comments: Using SPC. Slipped in shower twice; able to dress self but took longer. Difficulty with shoes/socks. Doing some cleaning/cooking. Functional Status:  Mobility: Bed Mobility Overal bed mobility: Needs Assistance Bed Mobility: Rolling, Sidelying to Sit, Sit to Sidelying Rolling: Mod assist, +2 for physical assistance Sidelying to sit: Mod assist, +2 for physical assistance Sit to sidelying: Mod assist, +2 for physical assistance General bed mobility comments: max directional verbal cues, pt initiating more with UEs and useing bedrail, maxA for LE management, increased time, lightheaded upon first sitting up but then dissipated BP 125/67 Transfers General transfer comment: deferred today, will progress next session Ambulation/Gait General Gait Details: unable   ADL: ADL Overall ADL's : Needs assistance/impaired Eating/Feeding: Set up Eating/Feeding Details (indicate cue type and reason): requires assist to setup breakfast tray end of session including opening containers Grooming: Moderate assistance, Bed level General ADL Comments: pt otherwise requiring maxA up to [facility] for ADL tasks given significant pain with any mobility/functional task   Cognition: Cognition Overall Cognitive Status: Within Functional Limits for tasks assessed Orientation Level: Oriented X4 Cognition Arousal/Alertness: Awake/alert Behavior During Therapy: Anxious Overall Cognitive Status: Within Functional Limits for tasks assessed General Comments: pt with tangential speech, easily distracted, focused on the pain   Blood pressure  123/84, pulse 87, temperature 98.4 F (36.9 C), temperature source Oral, resp. rate 16, height 5' 10.5" (1.791 m), weight 74.1 kg, SpO2 98 %. Physical Exam  Nursing note and vitals reviewed. Constitutional: Amaru Bynum is oriented to person, place, and time. Emmanuell Selfe appears well-developed and well-nourished.  Patient is sitting up slightly in bed- mother and father at bedside, as well as RN, NAD Pt is L handed  HENT:  Head: Normocephalic and atraumatic.  Nose: Nose normal.  Mouth/Throat: Oropharynx is clear and moist. No oropharyngeal exudate.  Eyes: Conjunctivae are normal. Right eye exhibits no discharge. Left eye exhibits no discharge. No scleral icterus.  Neck: No tracheal deviation present.  Cardiovascular:  RRR- no JVD  Respiratory: No stridor.  CTA B/L- no W/R/R- good air movement   GI:  Soft, NT, ND, (+)BS    Genitourinary:    Genitourinary Comments: Has condom catheter in place   Musculoskeletal:     Cervical back: Neck supple.     Comments: Did full strength/sensory exam  RUE_ biceps 5/5, WE 4+/5, tricep 4/5, grip 4/5, finger abd 4/5 LUE-- biceps 5/5, WE 4-/5, triceps 3+/5, grip 4-/5, finger abd 4-/5 RLE- HF 3-/5, KE 4/5, DF 4/5, PF 4/5, EHL 4-/5 LLE- HF 3/5, KE 2/5, DF 5/5, PF 5/5, EHL 4/5  Neurological: Mondale Dutcher is alert and oriented to person,  place, and time.  Patient is alert in no acute distress.  Oriented x3 and follows commands.  No hoffman's B/L 2-3 beats clonus in LEs B/L RUE C5-T1 normal sensation- 2/2 LUE- C5 nml 2/2; C6-T1 decreased 1/2 Torso intact 2/2 sensation RLE- L1 intact; otherwise  Decreased 1/2 sensation except L4 absent 0/2- L5-S1 decreased LLE- decreased sensation L1-L2 1/2; absent L3/L4 0/2; decreased L5/S1 1/2   Skin: Skin is warm and dry.  Was not able to assess back/neck incision  Psychiatric:  Frustrated overall- but cordial during 2nd visit      Lab Results Last 24 Hours  No results found for this or any previous visit  (from the past 24 hour(s)).   Imaging Results (Last 48 hours)  No results found.       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 Does the need for close, 24 hr/day medical supervision in concert with the patient's rehab needs make it unreasonable for this patient to be served in a less intensive setting? Potentially 2. Co-Morbidities requiring supervision/potential complications: quadriparesis, R legal blindness; post op pain; possible neurogenic bowel and bladder 3. Due to bladder management, bowel management, skin/wound care, disease management, medication administration, pain management and patient education, does the patient require 24 hr/day rehab nursing? Potentially 4. Does the patient require coordinated care of a physician, rehab nurse, therapy disciplines of PT, OT, SLP to address physical and functional deficits in the context of the above medical diagnosis(es)? Potentially Addressing deficits in the following areas: balance, endurance, locomotion, strength, transferring, bathing, dressing, feeding, grooming and toileting 5. Can the patient actively participate in an intensive therapy program of at least 3 hrs of therapy per day at least 5 days per week? Potentially 6. The potential for patient to make measurable gains while on inpatient rehab is good 7. Anticipated functional outcomes upon discharge from inpatient rehab are modified independent and supervision  with PT, modified independent and supervision with OT, n/a with SLP. 8. Estimated rehab length of stay to reach the above functional goals is: 3+ weeks 9. Anticipated discharge destination: Home 10. Overall Rehab/Functional Prognosis: good   RECOMMENDATIONS: This patient's condition is appropriate for continued rehabilitative care in the following setting: CIR Patient has agreed to participate in recommended program. Potentially Note that insurance prior  authorization may be required for reimbursement for recommended care.   Comment:  1. Pt is interested in going to [facility] for Acute SCI inpt rehab.  2. We discussed his ASIA level is C5 ASIA D (although not supposed to use +/- on ASIA, was trying to differentiate between weaker and stronger limbs, FYI).  3. Suggest PVRs to be done to make sure pt emptying- if done x 24 hours and are fine, then no need to continue- if has urinary retention, suggest Flomax 0.4 mg nightly. 4. Suggest aggressive bowel program since was impacted. Doesn't need neurogenic bowel program at this time, however will see what occurs- if he has control over bowels with more normal BMs.  5. Pain regimen per primary team- of note L 3rd digit is burning- might try lidocaine cream on that QID, just to see if might work.  6. Thank you for this consult.  7. I spent a total of 30 minutes initially; then 45 minutes 2nd visit and 20 minutes typing up and going over chart, so 1 hour 35 minutes total.    Lavon Paganini Angiulli, PA-C 06/06/2019  I have personally performed a face to face diagnostic evaluation of this patient and formulated the key components of the plan.  Additionally, I have personally reviewed laboratory data, imaging studies, as well as relevant notes and concur with the physician assistant's documentation above.

## 2019-06-08 NOTE — Progress Notes (Signed)
Patient arrived on unit, oriented to unit. Reviewed medications, therapy schedule, rehab routine and plan of care. States an understanding of information reviewed. Patient wants to be called by "B" and patient wants all staff members to respect their pronouns and address them by they/them. Nurse stated she would put a note in the computer for other staff members to know.  Jacob Lambert

## 2019-06-08 NOTE — Progress Notes (Addendum)
Neurosurgery Service Progress Note  Subjective: No acute events overnight, no BM, pain improving  Objective: Vitals:   06/07/19 2025 06/07/19 2038 06/07/19 2310 06/08/19 0405  BP: 124/81  120/77 112/66  Pulse: 85  76 75  Resp: 18  16   Temp:  98.5 F (36.9 C) 98.2 F (36.8 C) 98.2 F (36.8 C)  TempSrc:   Oral Oral  SpO2: 100%  99%   Weight:      Height:       Temp (24hrs), Avg:98.3 F (36.8 C), Min:98.2 F (36.8 C), Max:98.5 F (36.9 C)  CBC Latest Ref Rng & Units 06/06/2019 05/30/2019  WBC 4.0 - 10.5 K/uL 16.8(H) 11.3(H)  Hemoglobin 13.0 - 17.0 g/dL 12.0(L) 14.0  Hematocrit 39.0 - 52.0 % 36.8(L) 43.2  Platelets 150 - 400 K/uL 302 259   BMP Latest Ref Rng & Units 06/06/2019 06/01/2019  Glucose 70 - 99 mg/dL - 89  BUN 6 - 20 mg/dL - 17  Creatinine 0.61 - 1.24 mg/dL 0.87 1.19  Sodium 135 - 145 mmol/L - 137  Potassium 3.5 - 5.1 mmol/L - 4.4  Chloride 98 - 111 mmol/L - 102  CO2 22 - 32 mmol/L - 22  Calcium 8.9 - 10.3 mg/dL - 9.6    Intake/Output Summary (Last 24 hours) at 06/08/2019 0802 Last data filed at 06/08/2019 0600 Gross per 24 hour  Intake 750 ml  Output 4062 ml  Net -3312 ml    Current Facility-Administered Medications:  .  0.9 %  sodium chloride infusion, 250 mL, Intravenous, Continuous, Lorrine Killilea, Joyice Faster, MD, Stopped at 06/01/19 1930 .  acetaminophen (TYLENOL) tablet 650 mg, 650 mg, Oral, Q4H PRN, 650 mg at 06/07/19 1231 **OR** acetaminophen (TYLENOL) suppository 650 mg, 650 mg, Rectal, Q4H PRN, Laurence Crofford, Joyice Faster, MD .  Chlorhexidine Gluconate Cloth 2 % PADS 6 each, 6 each, Topical, Daily, [name], MD, 6 each at 06/07/19 1535 .  dexamethasone (DECADRON) injection 4 mg, 4 mg, Intravenous, Q12H, Gage Treiber, Joyice Faster, MD, 4 mg at 06/07/19 2236 .  diazepam (VALIUM) tablet 10 mg, 10 mg, Oral, Q6H PRN, [name], MD, 10 mg at 06/08/19 0438 .  docusate sodium (COLACE) capsule 100 mg, 100 mg, Oral, BID, [name], MD, 100 mg at 06/07/19  2049 .  enoxaparin (LOVENOX) injection 40 mg, 40 mg, Subcutaneous, Q24H, Connee Ikner, Joyice Faster, MD, 40 mg at 06/07/19 1800 .  famotidine (PEPCID) tablet 20 mg, 20 mg, Oral, BID, [name], MD, 20 mg at 06/07/19 2237 .  gabapentin (NEURONTIN) capsule 600 mg, 600 mg, Oral, TID, Erline Levine, MD, 600 mg at 06/07/19 2237 .  HYDROmorphone (DILAUDID) injection 1 mg, 1 mg, Intravenous, Q3H PRN, [name], MD, 1 mg at 06/05/19 1806 .  lactated ringers infusion, , Intravenous, Continuous, Lillia Abed, MD, Last Rate: 10 mL/hr at 06/04/19 1900, Rate Verify at 06/04/19 1900 .  lidocaine (LIDODERM) 5 % 1 patch, 1 patch, Transdermal, Q24H, Erline Levine, MD, 1 patch at 06/07/19 1534 .  menthol-cetylpyridinium (CEPACOL) lozenge 3 mg, 1 lozenge, Oral, PRN **OR** phenol (CHLORASEPTIC) mouth spray 1 spray, 1 spray, Mouth/Throat, PRN, Margi Edmundson A, MD .  methocarbamol (ROBAXIN) tablet 750 mg, 750 mg, Oral, Q6H PRN, Erline Levine, MD, 750 mg at 06/07/19 1717 .  ondansetron (ZOFRAN) tablet 4 mg, 4 mg, Oral, Q6H PRN, 4 mg at 06/05/19 2216 **OR** ondansetron (ZOFRAN) injection 4 mg, 4 mg, Intravenous, Q6H PRN, [name], MD, 4 mg at 06/02/19 0345 .  oxyCODONE (Oxy IR/ROXICODONE) immediate release tablet 10 mg, 10 mg, Oral, Q4H PRN, [name], MD, 10 mg at 06/08/19 0439 .  oxyCODONE (Oxy IR/ROXICODONE) immediate release tablet 5-10 mg, 5-10 mg, Oral, Q3H PRN, Erline Levine, MD, 10 mg at 06/06/19 1830 .  polyethylene glycol (MIRALAX / GLYCOLAX) packet 17 g, 17 g, Oral, Daily, Edahi Kroening A, MD, 17 g at 06/07/19 1041 .  sodium chloride flush (NS) 0.9 % injection 3 mL, 3 mL, Intravenous, Q12H, Luvern Mcisaac, Joyice Faster, MD, 3 mL at 06/07/19 2237 .  sodium chloride flush (NS) 0.9 % injection 3 mL, 3 mL, Intravenous, PRN, Supreme Rybarczyk, Joyice Faster, MD .  sodium phosphate (FLEET) 7-19 GM/118ML enema 1 enema, 1 enema, Rectal, Once, Leith Hedlund, Joyice Faster, MD   Physical Exam: L C8 > C7  numbness, LUE 4/5 and 3/5 in C8/T1 myotomes, RUE 4+/5, RLE 4/5, LLE 4/5 distally and 3/5 proximally, ankle clonus present on R but resolved on L Incision c/d/i  Assessment & Plan: 34 y.o. s/p resection of cervical intramedullary tumor, post-op MRI with gross total resection of enhancing portion.  Neuro: -diazepam, methocarbamol for muscle spasms -gabapentin at 600tid for dysesthetic pain -PM&R recs: PVRs minimal x24h, will d/c  Cardiopulm/Heme/ID No issues  FENGI -will change bowel regimen to senokot 2tab daily with suppository qhs until pt has a BM  Endo -d/c dex, d/c H2 antagonist  Dispo/PPx: -SCDs/TEDs, LMWH for PPx to avoid porcine-related agents -CIR transfer pending, okay for transfer from my perspective -final path grade 2 ependymoma  [name]  06/08/19 8:02 AM

## 2019-06-08 NOTE — Discharge Summary (Signed)
Discharge Summary  Date of Admission: 06/01/2019  Date of Discharge: 06/08/19  Attending Physician: Emelda Brothers, MD  Hospital Course: Patient was admitted following an uncomplicated posterior cervical laminoplasty for an intramedullary spinal cord tumor. He was recovered in PACU and transferred to 4N. Post-operatively, he had fairly significant dysesthetic pain that improved with starting gabapentin. He had constipation that was also fairly severe, requiring disimpaction. Muscle spasms were improved on diazepam. PVRs showed no significant urinary retention. His hospital course was otherwise uncomplicated and the patient was discharged to CIR on 06/08/19. Final pathology was grade 2 ependymoma. He will follow up in clinic with me in 2 weeks.  Neurologic exam at discharge:  L C8 > C7 numbness, LUE 4/5 and 3/5 in C8/T1 myotomes, RUE 4+/5, RLE 4/5, LLE 4/5 distally and 3/5 proximally, ankle clonus present on R but resolved on L  Discharge diagnosis: Cervical spinal cord tumor  Allergies as of 06/08/2019      Reactions   Cephalosporins Hives   Other    Patagonion toothfish      Medication List    STOP taking these medications   acetaminophen 500 MG tablet Commonly known as: TYLENOL   dexamethasone 4 MG tablet Commonly known as: DECADRON   naproxen sodium 220 MG tablet Commonly known as: ALEVE     TAKE these medications   bisacodyl 10 MG suppository Commonly known as: DULCOLAX Place 1 suppository (10 mg total) rectally at bedtime.   diazepam 10 MG tablet Commonly known as: VALIUM Take 1 tablet (10 mg total) by mouth every 6 (six) hours as needed for muscle spasms.   gabapentin 300 MG capsule Commonly known as: NEURONTIN Take 2 capsules (600 mg total) by mouth 3 (three) times daily.   glucosamine-chondroitin 500-400 MG tablet Take 1 tablet by mouth 3 (three) times daily.   methocarbamol 750 MG tablet Commonly known as: ROBAXIN Take 1 tablet (750 mg total) by mouth every  6 (six) hours as needed for muscle spasms. What changed:   medication strength  how much to take  when to take this  reasons to take this   multivitamin with minerals Tabs tablet Take 1 tablet by mouth daily.   senna-docusate 8.6-50 MG tablet Commonly known as: Senokot-S Take 2 tablets by mouth at bedtime.        [name], MD 06/08/19 10:41 AM

## 2019-06-08 NOTE — Progress Notes (Signed)
Inpatient Rehabilitation Medication Review by a Pharmacist  A complete drug regimen review was completed for this patient to identify any potential clinically significant medication issues.  Clinically significant medication issues were identified:  no   Type of Medication Issue Identified Description of Issue Plan   Drug Interaction(s) (clinically significant)      Duplicate Therapy      Allergy      No Medication Administration End Date      Incorrect Dose      Additional Drug Therapy Needed  Per note on day of discharge, planned for senna 2 tablets daily until bowel movement, not ordered but appears to have 1 BM documented on 6/3. Also as bisacodyl suppository and sorbitol PRN available  Continue to monitor constipation   Other  Home naproxen held, which is appropriate given surgery      Time spent performing this drug regimen review (minutes):  [place], PharmD, BCPS, [facility] Clinical Pharmacist  Please check AMION for all [place] phone numbers After 10:00 PM, call [place] [phone]

## 2019-06-08 NOTE — TOC Transition Note (Signed)
Transition of Care [facility]) - CM/SW Discharge Note   Patient Details  Name: Jacob Lambert MRN: [id number] Date of Birth: 01/09/1985  [facility] CM/SW Contact:  Verdell Carmine, RN Phone Number: 06/08/2019, 10:55 AM   Clinical Narrative:     CIR has  Receive authorization to accept patient. The patient has been accepted to CIR and will discharge today and move to CIR.They will let them know soon, see note from CIR.   Final next level of care: IP Rehab Facility Barriers to Discharge: No Barriers Identified   Patient Goals and CMS Choice        Discharge Placement             CIR       Patient and family notified of of transfer: (By CIR)  Discharge Plan and Services  CIR                                   Social Determinants of Health (SDOH) Interventions     Readmission Risk Interventions No flowsheet data found.

## 2019-06-08 NOTE — Progress Notes (Signed)
Inpatient Rehab Admissions Coordinator:   I have insurance authorization and a bed available for pt to admit to CIR today. Dr. Zada Finders in agreement.  I will let pt/family and TOC team know.   Shann Medal, PT, DPT Admissions Coordinator [phone] 06/08/19  10:46 AM

## 2019-06-08 NOTE — Progress Notes (Addendum)
After multiple attempts pt received the veggie burger he requested.

## 2019-06-08 NOTE — Progress Notes (Addendum)
Patient states he feels he has had a good night and thanked this RN for his care. He has not had a bowel movement even though attempts have been made. Patient vertebralis excitement and fear about his future and prognosis. Support and realistic ecouragement given to patient. Patient only had 48  and 0cc PVR .

## 2019-06-09 ENCOUNTER — Inpatient Hospital Stay (HOSPITAL_COMMUNITY): Payer: BC Managed Care – PPO | Admitting: Physical Therapy

## 2019-06-09 ENCOUNTER — Inpatient Hospital Stay (HOSPITAL_COMMUNITY): Payer: BC Managed Care – PPO

## 2019-06-09 DIAGNOSIS — M7989 Other specified soft tissue disorders: Secondary | ICD-10-CM

## 2019-06-09 LAB — CBC WITH DIFFERENTIAL/PLATELET
Abs Immature Granulocytes: 0.2 10*3/uL — ABNORMAL HIGH (ref 0.00–0.07)
Basophils Absolute: 0 10*3/uL (ref 0.0–0.1)
Basophils Relative: 0 %
Eosinophils Absolute: 0.3 10*3/uL (ref 0.0–0.5)
Eosinophils Relative: 2 %
HCT: 41.5 % (ref 39.0–52.0)
Hemoglobin: 13 g/dL (ref 13.0–17.0)
Immature Granulocytes: 2 %
Lymphocytes Relative: 33 %
Lymphs Abs: 4.6 10*3/uL — ABNORMAL HIGH (ref 0.7–4.0)
MCH: 28.2 pg (ref 26.0–34.0)
MCHC: 31.3 g/dL (ref 30.0–36.0)
MCV: 90 fL (ref 80.0–100.0)
Monocytes Absolute: 1.3 10*3/uL — ABNORMAL HIGH (ref 0.1–1.0)
Monocytes Relative: 9 %
Neutro Abs: 7.4 10*3/uL (ref 1.7–7.7)
Neutrophils Relative %: 54 %
Platelets: 376 10*3/uL (ref 150–400)
RBC: 4.61 MIL/uL (ref 4.22–5.81)
RDW: 13.8 % (ref 11.5–15.5)
WBC: 13.8 10*3/uL — ABNORMAL HIGH (ref 4.0–10.5)
nRBC: 0 % (ref 0.0–0.2)

## 2019-06-09 LAB — COMPREHENSIVE METABOLIC PANEL
ALT: 188 U/L — ABNORMAL HIGH (ref 0–44)
AST: 48 U/L — ABNORMAL HIGH (ref 15–41)
Albumin: 3 g/dL — ABNORMAL LOW (ref 3.5–5.0)
Alkaline Phosphatase: 80 U/L (ref 38–126)
Anion gap: 10 (ref 5–15)
BUN: 8 mg/dL (ref 6–20)
CO2: 28 mmol/L (ref 22–32)
Calcium: 9 mg/dL (ref 8.9–10.3)
Chloride: 100 mmol/L (ref 98–111)
Creatinine, Ser: 0.94 mg/dL (ref 0.61–1.24)
GFR calc Af Amer: >60 mL/min (ref >60)
GFR calc non Af Amer: >60 mL/min (ref >60)
Glucose, Bld: 96 mg/dL (ref 70–99)
Potassium: 4 mmol/L (ref 3.5–5.1)
Sodium: 138 mmol/L (ref 135–145)
Total Bilirubin: 0.7 mg/dL (ref 0.3–1.2)
Total Protein: 6.9 g/dL (ref 6.5–8.1)

## 2019-06-09 MED ORDER — SENNOSIDES-DOCUSATE SODIUM 8.6-50 MG PO TABS
2.0000 | ORAL_TABLET | Freq: Every day | ORAL | Status: DC
  Administered 2019-06-09 – 2019-06-22 (×14): 2 via ORAL
  Filled 2019-06-09 (×14): qty 2

## 2019-06-09 MED ORDER — POLYETHYLENE GLYCOL 3350 17 G PO PACK
17.0000 g | PACK | Freq: Every day | ORAL | Status: DC | PRN
  Administered 2019-06-10 – 2019-06-20 (×2): 17 g via ORAL
  Filled 2019-06-09 (×2): qty 1

## 2019-06-09 NOTE — Evaluation (Addendum)
Physical Therapy Assessment and Plan  Patient Details  Name: Jacob Lambert MRN: [id number] Date of Birth: 1985/06/18  PT Diagnosis: Abnormality of gait, Ataxic gait, Difficulty walking, Impaired sensation, Muscle weakness and Quadriplegia Rehab Potential: Good ELOS: 10-14 days   Today's Date: 06/09/2019 PT Individual Time: 3614-4315 PT Individual Time Calculation (min): 85 min    Problem List:  Patient Active Problem List   Diagnosis Date Noted  . 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 Quadriplegia and quadriparesis ([place])   . 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 Numbness 05/10/2019  . Weakness of left lower extremity 05/10/2019  . 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 Visual disturbance 05/10/2019    Past Medical History:  Past Medical History:  Diagnosis Date  . Anxiety   . Depression   . Headache   . Pneumonia    "walking"   Past Surgical History:  Past Surgical History:  Procedure Laterality Date  . LAMINECTOMY N/A 06/01/2019   Procedure: Cervical six-seven Laminoplasty, biopsy and resection of intramedullary spinal cord tumor;  Surgeon: [name], MD;  Location: [facility];  Service: Neurosurgery;  Laterality: N/A;  . NO PAST SURGERIES      Assessment & Plan Clinical Impression: Jacob Lambert is a 34 year old right-handed individual with history of tobacco abuse, anxiety depression as well as headaches.  Per chart review patient is presently living with their parents although they do have longer term residencies both in [place] and [place].  1 level home 2 steps to entry independent with assistive device.  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  Father is an ED physician at [facility].  Presented 06/01/2019 with progressive ataxia and leg weakness since February 2021.  Recent MRI of the brain negative.  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  Patient has been followed by both  Dr. Cecil Cobbs medical oncology as well as neurosurgery.  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 Venetia Constable.  Decadron protocol as indicated.  Final pathology grade 2 ependyoma.  Lovenox initiated for DVT prophylaxis 06/06/2019.  Pain control with the use of scheduled Neurontin 600 mg 3 times daily as well as Lidoderm patches.  They have been using Robaxin and Valium for muscle spasms as needed as well as oxycodone for breakthrough pain.  Bouts of constipation he did receive magnesium citrate with good results.  Therapy evaluations completed and patient was admitted for a comprehensive rehab program. Patient transferred to CIR on 06/08/2019 .   Patient currently requires min with mobility secondary to muscle weakness, decreased cardiorespiratoy endurance, unbalanced muscle activation, ataxia and decreased coordination and decreased sitting balance, decreased standing balance, decreased postural control and decreased balance strategies.  Prior to hospitalization, patient was modified independent  with mobility and lived with Family(parents) in a House home.  Home access is 2 from garageStairs to enter.  Patient will benefit from skilled PT intervention to maximize safe functional mobility, minimize fall risk and decrease caregiver burden for planned discharge home with intermittent assist.  Anticipate patient will benefit from follow up OP at discharge.  PT - End of Session Activity Tolerance: Tolerates 30+ min activity with multiple rests Endurance Deficit: Yes Endurance Deficit Description: HR elevated after 20 min activity PT Assessment Rehab Potential (ACUTE/IP ONLY): Good PT Barriers to Discharge: Medical stability;Incontinence PT Patient demonstrates impairments in the following area(s): Balance;Endurance;Motor;Safety;Sensory PT Transfers Functional Problem(s): Bed Mobility;Bed to Chair;Car;Furniture;Floor PT Locomotion  Functional  Problem(s): Ambulation;Wheelchair Mobility;Stairs PT Plan PT Intensity: Minimum of 1-2 x/day ,45 to 90 minutes PT Frequency: 5 out of 7 days PT Duration Estimated Length of Stay: 10-14 days PT Treatment/Interventions: Ambulation/gait training;Balance/vestibular training;Community reintegration;Discharge planning;DME/adaptive equipment instruction;Functional electrical stimulation;Functional mobility training;Neuromuscular re-education;Pain management;Patient/family education;Psychosocial support;Splinting/orthotics;Stair training;Therapeutic Activities;Therapeutic Exercise;UE/LE Strength taining/ROM;UE/LE Coordination activities PT Transfers Anticipated Outcome(s): mod I PT Locomotion Anticipated Outcome(s): Supervision to mod I with LRAD PT Recommendation Recommendations for Other Services: Neuropsych consult Follow Up Recommendations: Outpatient PT Patient destination: Home Equipment Recommended: Rolling walker with 5" wheels Equipment Details: TBD pending progress  Skilled Therapeutic Intervention Evaluation completed (see details above and below) with education on PT POC and goals and individual treatment initiated with focus on functional bed mobility, transfer, and gait assessment as well as orientation to rehab unit. Pt received supine in bed, agreeable to PT evaluation. Pt reports ongoing chronic headaches as well as pain in their L knee with mobility described as "stiffness". L knee pain not rated, believed to be musculoskeletal in nature and will continue to assess. Supine resting heart rate at 90 bpm. Supine to sit with min A via log roll technique. While seated EOB pt reports feeling lightheaded. Seated BP 118/75 and symptoms resolve while seated EOB. Assisted pt with donning shoes. Pt is setup A for use of urinal while seated EOB. Stand pivot transfer bed to w/c with min A and RW. Ambulation x 150 ft with RW and min A. Pt exhibits LE ataxia and some scissoring of gait, no instances of LE  buckling during gait. Seated heart rate 130 following ambulation. Car transfer with min A and RW, cues for sequencing of safe transfer. Pt with bowel incontinence during car transfer. Assisted pt back into w/c then back into bed for pericare as incontinence ongoing due to self-reported poor sphincter control. Pt is min A for rolling L/R in bed for dependent brief change and pericare. Assisted pt with doffing dress and donning new dress while seated EOB. Pt left supine in bed with needs in reach, bed alarm in place at end of session.  PT Evaluation Precautions/Restrictions Precautions Precautions: Fall Precaution Comments: watch BP and HR Restrictions Weight Bearing Restrictions: No Home Living/Prior Functioning Home Living Living Arrangements: Parent Available Help at Discharge: Family;Available PRN/intermittently Type of Home: House Home Access: Stairs to enter [employer] of Steps: 2 from garage Entrance Stairs-Rails: None Home Layout: One level  Lives With: Family(parents) Prior Function Level of Independence: Independent with gait;Independent with transfers;Requires assistive device for independence(used SPC within the last few months)  Able to Take Stairs?: Yes Driving: Yes Vocation: Full time employment Vocation Requirements: 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 Vision/Perception  Perception Perception: Within Advertising copywriter Praxis Praxis: Impaired Praxis Impairment Details: Motor planning  Cognition Overall Cognitive Status: Within Functional Limits for tasks assessed Arousal/Alertness: Awake/alert Orientation Level: Oriented X4 Attention: Focused Focused Attention: Appears intact Memory: Appears intact Awareness: Appears intact Problem Solving: Appears intact Safety/Judgment: Appears intact Sensation Sensation Light Touch: Impaired Detail Light Touch Impaired Details: Impaired LUE;Impaired RLE;Impaired LLE(numbness medial LLE) Proprioception:  Impaired Detail Proprioception Impaired Details: Impaired RLE;Impaired LLE Coordination Gross Motor Movements are Fluid and Coordinated: No Fine Motor Movements are Fluid and Coordinated: No Coordination and Movement Description: 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 Motor  Motor Motor: Tetraplegia Motor - Skilled Clinical Observations: decreased coordination, strength and motor planning in L extremities  Mobility Bed Mobility Bed Mobility: Rolling Right;Rolling Left;Supine to Sit;Sit to Supine Rolling Right: Minimal Assistance - Patient > 75% Rolling  Left: Minimal Assistance - Patient > 75% Supine to Sit: Minimal Assistance - Patient > 75% Sit to Supine: Minimal Assistance - Patient > 75% Transfers Transfers: Sit to Stand;Stand Pivot Transfers;Stand to Sit Sit to Stand: Minimal Assistance - Patient > 75% Stand to Sit: Minimal Assistance - Patient > 75% Stand Pivot Transfers: Minimal Assistance - Patient > 75% Stand Pivot Transfer Details: Verbal cues for safe use of DME/AE;Manual facilitation for weight shifting;Manual facilitation for placement Transfer (Assistive device): Rolling walker Locomotion  Gait Gait Distance (Feet): 150 Feet Assistive device: Rolling walker Gait Gait Pattern: Impaired(ataxia) Gait velocity: decreased Stairs / Additional Locomotion Stairs: No Wheelchair Mobility Wheelchair Mobility: No  Trunk/Postural Assessment  Cervical Assessment Cervical Assessment: Within Functional Limits Thoracic Assessment Thoracic Assessment: Within Functional Limits Lumbar Assessment Lumbar Assessment: Within Functional Limits Postural Control Postural Control: Deficits on evaluation  Balance Balance Balance Assessed: Yes Static Sitting Balance Static Sitting - Balance Support: No upper extremity supported;Feet supported Static Sitting - Level of Assistance: 5: Stand by assistance Dynamic Sitting Balance Dynamic Sitting - Balance Support: No upper extremity  supported;Feet supported;During functional activity Dynamic Sitting - Level of Assistance: 5: Stand by assistance Static Standing Balance Static Standing - Balance Support: Bilateral upper extremity supported;During functional activity Static Standing - Level of Assistance: 4: Min assist Dynamic Standing Balance Dynamic Standing - Balance Support: Bilateral upper extremity supported;During functional activity Dynamic Standing - Level of Assistance: 4: Min assist Extremity Assessment   RLE Assessment RLE Assessment: Exceptions to [facility] General Strength Comments: impaired, see below RLE Strength Right Hip Flexion: 4/5 Right Knee Flexion: 3-/5 Right Knee Extension: 4/5 Right Ankle Dorsiflexion: 3+/5 LLE Assessment LLE Assessment: Exceptions to [facility] General Strength Comments: impaired, see below LLE Strength Left Hip Flexion: 3+/5 Left Knee Flexion: 4/5 Left Knee Extension: 4/5 Left Ankle Dorsiflexion: 3+/5    Refer to Care Plan for Long Term Goals  Recommendations for other services: Neuropsych  Discharge Criteria: Patient will be discharged from PT if patient refuses treatment 3 consecutive times without medical reason, if treatment goals not met, if there is a change in medical status, if patient makes no progress towards goals or if patient is discharged from hospital.  The above assessment, treatment plan, treatment alternatives and goals were discussed and mutually agreed upon: by patient   Excell Seltzer, PT, DPT 06/09/2019, 4:24 PM

## 2019-06-09 NOTE — Progress Notes (Signed)
[facility] PHYSICAL MEDICINE & REHABILITATION PROGRESS NOTE   Subjective/Complaints:   Pt reports 6/7 last meals were wrong- 2 of those since came ot CIR.   Didn't have results with bowel program- wasn't sure they received dig stim, but did- will order lidodcaine jelly just in case needed.   Did have bowel leakage throughout the night.   Pain is under control per pt.   ROS:  Pt denies SOB, abd pain, CP, N/V/C/D, and vision changes   Objective:   No results found. Recent Labs    06/06/19 1820 06/09/19 0657  WBC 16.8* 13.8*  HGB 12.0* 13.0  HCT 36.8* 41.5  PLT 302 376   Recent Labs    06/06/19 1820 06/09/19 0657  NA  --  138  K  --  4.0  CL  --  100  CO2  --  28  GLUCOSE  --  96  BUN  --  8  CREATININE 0.87 0.94  CALCIUM  --  9.0    Intake/Output Summary (Last 24 hours) at 06/09/2019 1120 Last data filed at 06/09/2019 0740 Gross per 24 hour  Intake 480 ml  Output 1650 ml  Net -1170 ml     Physical Exam: Vital Signs Blood pressure 127/88, pulse 100, temperature 99 F (37.2 C), temperature source Oral, resp. rate 14, height 5' 10.5" (1.791 m), SpO2 99 %.  Nursing note and vitals reviewed. Constitutional: pt is sitting up in bed- appropriate, in good spirits, no one else at bedside; NAD HENT: conjugate gaze Tongue midline and smile equal  Neck:  Posterior neck incision- looks great- staples intact- no erythema; no drainage - no change Cardiovascular: RRR Respiratory: CTA B/L GI: Soft, NT, ND, (+)BS - blueberries on breakfast plate Musculoskeletal:     Cervical back: Neck supple.     Comments: UE- biceps 5/5, WE 5-/5, triceps 4/5, grip 3+/5, finger abd 3/5 2 days ago- R and L UEs were different- today they are the same on exam RLE- HF 3-/5, KE 3+/5, DF 4/5, PF 4/5, EHL 4/5 LLE- HF 3/5, KE 4/5, DF 5/5, PF 5/5, EHL 4+/5  Neurological: Ox3 2 beats clonus in LEs- slightly more normal- no hoffman's B/L in UEs- no change Sensory LT no significant change  since done 2 days ago- checked EVERY dermatome at that time- per d/w pt, no change in sensation No increased tone in UEs or LEs B/L  Skin: Skin is warm and dry.  Neck incision as previously mentioned  Psychiatric:  Bright affect    Assessment/Plan: 1. 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  Physiatrist is providing close team supervision and 24 hour management of active medical problems listed below.  Physiatrist and rehab team continue to assess barriers to discharge/monitor patient progress toward functional and medical goals  Care Tool:  Bathing              Bathing assist       Upper Body Dressing/Undressing Upper body dressing        Upper body assist      Lower Body Dressing/Undressing Lower body dressing            Lower body assist       Toileting Toileting    Toileting assist Assist for toileting: Independent with assistive device Assistive Device Comment: urinal   Transfers Chair/bed transfer  Transfers assist  Locomotion Ambulation   Ambulation assist              Walk 10 feet activity   Assist           Walk 50 feet activity   Assist           Walk 150 feet activity   Assist           Walk 10 feet on uneven surface  activity   Assist           Wheelchair     Assist               Wheelchair 50 feet with 2 turns activity    Assist            Wheelchair 150 feet activity     Assist          Blood pressure 127/88, pulse 100, temperature 99 F (37.2 C), temperature source Oral, resp. rate 14, height 5' 10.5" (1.791 m), SpO2 99 %.  Medical Problem List and Plan: 1.  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  Status post C6-7  laminoplasty resection of spinal cord tumor 06/01/2019.  Decadron taper completed             -patient may  Shower if they cover neck with water occlusive dressing             -ELOS/Goals: ~ 3 weeks- goals mod I ot supervision 2.  Antithrombotics: -DVT/anticoagulation: Lovenox.  Check vascular study- will need for 2-3 months depending on walking ability-              -antiplatelet therapy: N/A 3. Pain Management: Neurontin 600 mg 3 times daily, Lidoderm patch as directed, Valium 10 mg every 6 hours as needed muscle spasms, Robaxin 750 mg every 6 hours as needed muscle spasms, oxycodone as needed  6/4- will discuss Baclofen if spasms get worse 4. Mood: - don't think pt on meds- will add if needed             -antipsychotic agents: N/A 5. Neuropsych: This patient is capable of making decisions on their own behalf. 6. Skin/Wound Care: Routine skin checks 7. Fluids/Electrolytes/Nutrition: Routine in and outs with follow-up chemistries  6/4- BMP looks good- con't to check weekly 8.  Constipation in setting of Neurogenic bowel.  -add suppository nightly and digital stimulation of rectum to improve constipation and bowel movements- if recovers ability to have BMs on their own, will stop.   6/4- no results last night, but had very large BM yesterday AM- will con't bowel program an dadd Lidocaine gel.  9. Minimal PVRs- will not reorder PVRs- voiding OK-  10. Spasticity- explained it's normal that it gets worse over time- is very mild right now- will monitor Will check to see if needs Robaxin- is not great for spasticity- might be for muscle spasms of neck- if not, will stop and start Baclofen.  11. Leukocytosis  6/4- WBC down to 13.8 from 16.8- no Sx's of illness  LOS: 1 days A FACE TO FACE EVALUATION WAS PERFORMED  Honesty Menta 06/09/2019, 11:20 AM

## 2019-06-09 NOTE — Evaluation (Signed)
Occupational Therapy Assessment and Plan  Patient Details  Name: Jacob Lambert MRN: [id number] Date of Birth: [birth year]  OT Diagnosis: ataxia, muscle weakness (generalized), quadriparesis at level C5 and coordination disorder Rehab Potential:   ELOS: 10-14   Today's Date: 06/09/2019 OT Individual Time: 0900-1000 OT Individual Time Calculation (min): 60 min     Problem List:  Patient Active Problem List   Diagnosis Date Noted  . 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 Quadriplegia and quadriparesis ([place])   . 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 Numbness 05/10/2019  . Weakness of left lower extremity 05/10/2019  . 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 Visual disturbance 05/10/2019    Past Medical History:  Past Medical History:  Diagnosis Date  . Anxiety   . Depression   . Headache   . Pneumonia    "walking"   Past Surgical History:  Past Surgical History:  Procedure Laterality Date  . LAMINECTOMY N/A 06/01/2019   Procedure: Cervical six-seven Laminoplasty, biopsy and resection of intramedullary spinal cord tumor;  Surgeon: [name], MD;  Location: [place];  Service: Neurosurgery;  Laterality: N/A;  . NO PAST SURGERIES      Assessment & Plan Clinical Impression: Jacob Lambert a 34 y.o.right-handed adultwith history of tobacco abuse, anxiety,depression as well as headaches. Pt's pronouns are them/their.Father is an ED physician at [facility]. Presented 06/01/2019 with progressive ataxia and leg weaknesssince February 2021. Recent MRI of the brain negative. 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 Underwent C6-C7 laminoplastyandresection of intradural intramedullary spinal cord tumor 06/01/2019 per Dr. Venetia Constable. Decadron protocol as indicated.Pathologywas grade 2 ependymoma. Medical oncology Dr. Luvenia Heller.Therapy evaluations completed with recommendations of physical medicine rehab consult.  Patient currently requires min with  basic self-care skills secondary to muscle weakness, decreased cardiorespiratoy endurance, unbalanced muscle activation and decreased coordination, decreased visual acuity and decreased sitting balance, decreased standing balance, decreased postural control, decreased balance strategies and difficulty maintaining precautions  Prior to hospitalization, patient could complete BADL/IADL with modified independent .  Patient will benefit from skilled intervention to decrease level of assist with basic self-care skills and increase independence with basic self-care skills prior to discharge home with care partner.  Anticipate patient will require intermittent supervision and follow up outpatient.  OT - End of Session Activity Tolerance: Tolerates 10 - 20 min activity with multiple rests Endurance Deficit: Yes Endurance Deficit Description: HR elevated after 20 min activity OT Assessment Rehab Potential (ACUTE ONLY): Good OT Barriers to Discharge: Neurogenic Bowel & Bladder;Pending chemo/radiation OT Patient demonstrates impairments in the following area(s): Balance;Endurance;Motor;Sensory;Vision OT Basic ADL's Functional Problem(s): Grooming;Bathing;Dressing;Toileting OT Advanced ADL's Functional Problem(s): Simple Meal Preparation OT Transfers Functional Problem(s): Toilet;Tub/Shower OT Additional Impairment(s): Fuctional Use of Upper Extremity OT Plan OT Intensity: Minimum of 1-2 x/day, 45 to 90 minutes OT Frequency: 5 out of 7 days OT Duration/Estimated Length of Stay: 10-14 OT Treatment/Interventions: Balance/vestibular training;Discharge planning;Pain management;Self Care/advanced ADL retraining;Therapeutic Activities;UE/LE Coordination activities;Visual/perceptual remediation/compensation;Patient/family education;Therapeutic Exercise;Functional mobility training;Disease mangement/prevention;Community reintegration;DME/adaptive equipment instruction;Neuromuscular re-education;Psychosocial  support;Splinting/orthotics;UE/LE Strength taining/ROM OT Self Feeding Anticipated Outcome(s): MOD I OT Basic Self-Care Anticipated Outcome(s): MOD I OT Toileting Anticipated Outcome(s): MOD I OT Bathroom Transfers Anticipated Outcome(s): MOD I OT Recommendation Patient destination: Home Follow Up Recommendations: Outpatient OT Equipment Recommended: Tub/shower seat;To be determined Equipment Details: has built in bench in walk in shower with door; no grab bars   Skilled Therapeutic Intervention 1:1. Pt received in bed agreeable to OT after education on OT role/purpose, CIR, ELOS, POC and SCI recovery. Pt  reporitng need to toilet and log rolls with S and transitions to EOB from supine with S. Pt completes all mobility with RW at [facility] level with VC for RW management/hand placement to stand over toilet and void bladder. Pt excited for first standing void. Pt completes standing hand hygeine before reporting fatigue. Pt sits for oral care with set up. Vitals assessed below with elevated HR of note 127. Pt rests 5 min while discussing home set up and OT checks HR again 133. Pt returned to supine in same manner above. Pt required max A overall to don shoes and VC for doffing strategy. Pt HR decreased to 106 after resting in supine. OT educates on strategies for NMR and activities to complete at bed level outside of OT. Exited session wih tpt seated in bed, exit alarm on and call light in reach  OT Evaluation Precautions/Restrictions  Precautions Precautions: Fall Precaution Booklet Issued: No Precaution Comments: watch BP Restrictions Weight Bearing Restrictions: No General   Vital Signs Therapy Vitals Pulse Rate: 100 BP: 127/88 Patient Position (if appropriate): Sitting Oxygen Therapy SpO2: 99 % O2 Device: Room Air Pain Pain Assessment Faces Pain Scale: Hurts a little bit Home Living/Prior Functioning Home Living Family/patient expects to be discharged to:: Private  residence Living Arrangements: Parent ADL ADL Eating: Supervision/safety Grooming: Minimal assistance Where Assessed-Grooming: Sitting at sink Upper Body Bathing: Supervision/safety Where Assessed-Lower Body Bathing: Edge of bed Upper Body Dressing: Maximal assistance(shoes only) Vision Baseline Vision/History: Legally blind Additional Comments: R eye legally blind Perception  Perception: Within Functional Limits Praxis Praxis: Impaired Praxis Impairment Details: Motor planning Cognition Overall Cognitive Status: Within Functional Limits for tasks assessed Orientation Level: Person;Place;Situation Person: Oriented Place: Oriented Situation: Oriented Year: 2021 Month: June Day of Week: Correct Memory: Appears intact Immediate Memory Recall: Sock;Blue;Bed Memory Recall Sock: Without Cue Memory Recall Blue: Without Cue Memory Recall Bed: Without Cue Awareness: Appears intact Problem Solving: Appears intact Safety/Judgment: Appears intact Sensation Sensation Light Touch: Impaired by gross assessment(L hand numb except for middle finger which burns; BLE decreased sensation- LLE>RLE- numbness LLE along medial side of LLE) Coordination Gross Motor Movements are Fluid and Coordinated: No Fine Motor Movements are Fluid and Coordinated: No Motor  Motor Motor: Tetraplegia Motor - Skilled Clinical Observations: decreased coordination, strength and motor planning in L extremities Mobility  Bed Mobility Bed Mobility: Rolling Right;Rolling Left;Supine to Sit;Sit to Supine Rolling Right: Supervision/verbal cueing Rolling Left: Supervision/Verbal cueing Supine to Sit: Supervision/Verbal cueing Sit to Supine: Supervision/Verbal cueing Transfers Sit to Stand: Minimal Assistance - Patient > 75% Stand to Sit: Minimal Assistance - Patient > 75%  Trunk/Postural Assessment    Cervical- decreased ROM/stiffness d/t surgery Thoracic-lumbar WFL Decreased postural control/righting  reactions Balance   MIN A standing balance Supervision sitting balance Extremity/Trunk Assessment RUE Assessment RUE Assessment: Exceptions to [facility] General Strength Comments: 4/5 grossly; coordination greater than LUE, sensation also better than LUE; full AROM LUE Assessment LUE Assessment: Exceptions to [facility] General Strength Comments: decreased coordination/desterity with nerve damage impacting 5th digit. able to oppose all digits, decreased sensation in all digits except buring pain in middle digit     Refer to Care Plan for Long Term Goals  Recommendations for other services: Therapeutic Recreation  Stress management and Outing/community reintegration   Discharge Criteria: Patient will be discharged from OT if patient refuses treatment 3 consecutive times without medical reason, if treatment goals not met, if there is a change in medical status, if patient makes no progress towards goals or if  patient is discharged from hospital.  The above assessment, treatment plan, treatment alternatives and goals were discussed and mutually agreed upon: by patient  [name] 06/09/2019, 10:59 AM

## 2019-06-09 NOTE — IPOC Note (Addendum)
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[place])   Transaminitis   Leukocytosis   Neurogenic bowel   Postoperative pain     Functional Problem List: Nursing Bladder, Bowel, Endurance, Medication Management, Pain, Safety, Sensory, Skin Integrity  PT Balance, Endurance, Motor, Safety, Sensory  OT Balance, Endurance, Motor, Sensory, Vision  SLP    TR         Basic ADL's: OT Grooming, Bathing, Dressing, Toileting     Advanced  ADL's: OT Simple Meal Preparation     Transfers: PT Bed Mobility, Bed to Chair, Car, Furniture, Futures trader, Metallurgist: PT Ambulation, Emergency planning/management officer, Stairs     Additional Impairments: OT Fuctional Use of Upper Extremity  SLP        TR      Anticipated Outcomes Item Anticipated Outcome  Self Feeding MOD I  Swallowing      Basic self-care  MOD I  Toileting  MOD I   Bathroom Transfers MOD I  Bowel/Bladder  Min assist with bowel program and bladder assistance  Transfers  mod I  Locomotion  Supervision to mod I with LRAD  Communication     Cognition     Pain  <3 on a 0-10 pain scale  Safety/Judgment  Min assist with safety using cues and reminders   Therapy Plan: PT Intensity: Minimum of 1-2 x/day ,45 to 90 minutes PT Frequency: 5 out of 7 days PT Duration Estimated Length of Stay: 10-14 days OT Intensity: Minimum of 1-2 x/day, 45 to 90 minutes OT Frequency: 5 out of 7 days OT Duration/Estimated Length of Stay: 10-14     Due to the current state of emergency, patients may not be receiving their 3-hours of Medicare-mandated therapy.   Team Interventions: Nursing Interventions Patient/Family Education, Bladder Management, Bowel Management, Disease Management/Prevention, Pain  Management, Medication Management, Skin Care/Wound Management, Discharge Planning, Psychosocial Support  PT interventions Ambulation/gait training, Training and development officer, Community reintegration, Discharge planning, DME/adaptive equipment instruction, Functional electrical stimulation, Functional mobility training, Neuromuscular re-education, Pain management, Patient/family education, Psychosocial support, Splinting/orthotics, Stair training, Therapeutic Activities, Therapeutic Exercise, UE/LE Strength taining/ROM, UE/LE Coordination activities  OT Interventions Balance/vestibular training, Discharge planning, Pain management, Self Care/advanced ADL retraining, Therapeutic Activities, UE/LE Coordination activities, Visual/perceptual remediation/compensation, Patient/family education, Therapeutic Exercise, Functional mobility training, Disease mangement/prevention, Community reintegration, DME/adaptive equipment instruction, Neuromuscular re-education, Psychosocial support, Splinting/orthotics, UE/LE Strength taining/ROM  SLP Interventions    TR Interventions    SW/CM Interventions Discharge Planning, Psychosocial Support, Patient/Family Education   Barriers to Discharge MD  Medical stability, Home enviroment access/loayout, Incontinence, Neurogenic bowel and bladder, Wound care, Weight bearing restrictions and Behavior  Nursing Medical stability, Home environment access/layout, Neurogenic Bowel & Bladder, Medication compliance    PT Medical stability, Incontinence    OT Neurogenic Bowel & Bladder, Pending chemo/radiation    SLP      SW Decreased caregiver support, Lack of/limited family support     Team Discharge Planning: Destination: PT-Home ,OT- Home , SLP-  Projected Follow-up: PT-Outpatient PT, OT-  Outpatient OT, SLP-  Projected Equipment Needs: PT-Rolling walker with 5" wheels, OT- Tub/shower seat, To be determined, SLP-  Equipment Details: PT-TBD pending progress, OT-has built  in bench in walk in shower with door; no grab bars Patient/family involved in discharge planning: PT- Patient,  OT-Patient, SLP-  MD ELOS: 2- 3 weeks Medical Rehab Prognosis:  Excellent Assessment: Pt is a 34 yr old NB pt with ependymoma s/p resection in cervical spine with resulting C5 ASIA D- quadriplegia and neurogenic bowel and bladder and extremely mild spasticity.  Goals Mod I by d/c- will likely need 2.5 to 3 weeks, to help with bowel program, etc.        See Team Conference Notes for weekly updates to the plan of care

## 2019-06-09 NOTE — Progress Notes (Signed)
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   Quadriplegia and quadriparesis ([place])    Spinal cord tumor 05/23/2019   Numbness 05/10/2019   Weakness of left lower extremity 05/10/2019   Hyperreflexia 05/10/2019   Visual disturbance 05/10/2019   Past Medical History:  Past Medical History:  Diagnosis Date   Anxiety    Depression    Headache    Pneumonia    "walking"   Past Surgical History:  Past Surgical History:  Procedure Laterality Date   LAMINECTOMY N/A 06/01/2019   Procedure: Cervical six-seven Laminoplasty, biopsy and resection of intramedullary spinal cord tumor;  Surgeon: [name], MD;  Location: [place];  Service: Neurosurgery;  Laterality: N/A;   NO PAST SURGERIES     Social History:  reports that Jacob Councilman "B" has quit smoking. Jacob Councilman "B" has never used smokeless tobacco. Jacob Councilman "B" reports current alcohol use. Jacob Councilman "B" reports previous drug use.  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 Adjusting to current condition. Recent Psychosocial Issues: Adjusting to current medical condition Psychiatric History: hx of depression and anxiety; recently found a therapist that identies with their community Substance Abuse History: Denies recreational use; admits to poppers during sex only;quit smoking ciagerettes; EtOH use varies 3-4 bottles per week/social drinker if at an event  Patient / Family Perceptions, Expectations & Goals Pt/Family understanding of illness & functional limitations: Pt has a general understading of current condition Premorbid pt/family roles/activities: Independent Anticipated changes in roles/activities/participation: Assitance with ADLs/IADLs  [employer]: None Premorbid Home Care/DME Agencies: None Transportation available at discharge: Family Resource referrals recommended: Neuropsychology  Discharge Planning Living Arrangements: Parent Support Systems: Parent Type of Residence: Private residence Insurance Resources: Multimedia programmer (specify)(BCBS) Financial Resources: Employment Financial Screen Referred: No Living Expenses: Lives with family Money Management: Patient Does the patient have any problems obtaining your medications?: No Care Coordinator Barriers to Discharge: Decreased caregiver support, Lack of/limited family support Care Coordinator Anticipated Follow Up Needs: HH/OP  Clinical Impression SW met with pt in room to introduce self, explain role, and discuss discharge process. Pt intends to return to parent's home who will provide assistance. Pt mother is an Therapist, sports and father is an ED physician (3rd shift). Pt reports they have completed a living will and testament. Pt would like to complete HCPOA. Pt is  not a English as a second language teacher. DME: cane and suction grab bars. Pt also reports being legally blind in R eye. Pt religious denominiation is Jewish.   SW spoke with Jacob Lambert/Jacob Lambert [phone]) to schedule  new patient appointment: Tuesday, July 13 at 9:20am.  *SW spoke with pt assigned RN requesting chaplain services order for [facility].  Jacob Lambert A Jeffren Dombek 06/09/2019, 3:40 PM

## 2019-06-09 NOTE — Plan of Care (Signed)
  Problem: Consults Goal: RH SPINAL CORD INJURY PATIENT EDUCATION Description:  See Patient Education module for education specifics.  Outcome: Progressing   Problem: SCI BOWEL ELIMINATION Goal: RH STG MANAGE BOWEL WITH ASSISTANCE Description: STG Manage Bowel with Assistance. Outcome: Progressing Flowsheets (Taken 06/09/2019 1402) STG: Pt will manage bowels with assistance: 4-Minimum assistance Goal: RH STG MANAGE BOWEL W/EQUIPMENT W/ASSISTANCE Description: STG Manage Bowel With Equipment With Assistance Outcome: Progressing Flowsheets (Taken 06/09/2019 1402) STG: Pt will manage bowels with equipment with assistance: 4-Minimum assistance Goal: RH STG SCI MANAGE BOWEL PROGRAM W/ASSIST OR AS APPROPRIATE Description: STG SCI Manage bowel program w/assist or as appropriate. Outcome: Progressing Flowsheets (Taken 06/09/2019 1402) STG: SCI Pt will manage bowels with assistance or as appropriate: Minimum assist   Problem: SCI BLADDER ELIMINATION Goal: RH STG MANAGE BLADDER WITH EQUIPMENT WITH ASSISTANCE Description: STG Manage Bladder With Equipment With Assistance Outcome: Progressing Flowsheets (Taken 06/09/2019 1402) STG: Pt will manage bladder with equipment with assistance: 4-Minimal assistance   Problem: RH SKIN INTEGRITY Goal: RH STG SKIN FREE OF INFECTION/BREAKDOWN Outcome: Progressing Goal: RH STG MAINTAIN SKIN INTEGRITY WITH ASSISTANCE Description: STG Maintain Skin Integrity With Assistance. Outcome: Progressing Flowsheets (Taken 06/09/2019 1402) STG: Maintain skin integrity with assistance: 3-Moderate assistance   Problem: RH SAFETY Goal: RH STG ADHERE TO SAFETY PRECAUTIONS W/ASSISTANCE/DEVICE Description: STG Adhere to Safety Precautions With Assistance/Device. Outcome: Progressing Flowsheets (Taken 06/09/2019 1402) STG:Pt will adhere to safety precautions with assistance/device: 4-Minimal assistance   Problem: RH PAIN MANAGEMENT Goal: RH STG PAIN MANAGED AT OR BELOW PT'S  PAIN GOAL Description: STG pt's pain level less than 4 on scale of 1-10 Outcome: Progressing   Problem: RH KNOWLEDGE DEFICIT SCI Goal: RH STG INCREASE KNOWLEDGE OF SELF CARE AFTER SCI Outcome: Progressing

## 2019-06-09 NOTE — Progress Notes (Signed)
Patient requests to have dulcolax suppository and dig stem done at 8 pm.

## 2019-06-09 NOTE — Progress Notes (Signed)
Inpatient Rehabilitation  Patient information reviewed and entered into eRehab system by Graycen Sadlon M. Lerline Valdivia, M.A., CCC/SLP, PPS Coordinator.  Information including medical coding, functional ability and quality indicators will be reviewed and updated through discharge.    

## 2019-06-09 NOTE — Progress Notes (Signed)
Bilateral lower extremity venous duplex completed. Refer to "CV Proc" under chart review to view preliminary results.  06/09/2019 4:38 PM Kelby Aline., MHA, RVT, RDCS, RDMS

## 2019-06-09 NOTE — Progress Notes (Signed)
Occupational Therapy Session Note  Patient Details  Name: Saw Mendenhall MRN: [id number] Date of Birth: May 28, 1985  Today's Date: 06/09/2019 OT Individual Time: 1345-1445 OT Individual Time Calculation (min): 60 min    Short Term Goals: Week 1:  OT Short Term Goal 1 (Week 1): Pt will transfer wiht S overall consistently with LRAD to toilet OT Short Term Goal 2 (Week 1): Pt will step over shower edge wiht CGA using LRAD OT Short Term Goal 3 (Week 1): Pt will complete LB dressing with S OT Short Term Goal 4 (Week 1): Pt will complete UB dressing wiht set up  Skilled Therapeutic Interventions/Progress Updates:  Pt resting in bed upon arrival and "eager" to take a shower.  OT intervention with focus on bed mobility, sit<>stand, stand balance, functional transfers, sitting balance, BADL retraining, activity tolerance, and safety awareness to increase independence with BADLs.  See Care Tool for assist levels. Pt requires min verbal cues for technique with transfers but all transfers with min A.  Sit<>stand and standing balance with min A. Pt is L hand dominant and discussion ensured about shaving with RUE because of L hand decreased sensation and decreased coordination.  Assured pt that this would be a goal they can work on during therapy. Pt returned to bed and remained in bed with all needs within reach and bed alarm activated.   Therapy Documentation Precautions:  Precautions Precautions: Fall Precaution Booklet Issued: No Precaution Comments: watch BP Restrictions Weight Bearing Restrictions: No    Pain: Pt denies pain this afternoon  Therapy/Group: Individual Therapy  [name] 06/09/2019, 3:24 PM

## 2019-06-10 ENCOUNTER — Inpatient Hospital Stay (HOSPITAL_COMMUNITY): Payer: BC Managed Care – PPO

## 2019-06-10 ENCOUNTER — Inpatient Hospital Stay (HOSPITAL_COMMUNITY): Payer: BC Managed Care – PPO | Admitting: Physical Therapy

## 2019-06-10 DIAGNOSIS — D72829 Elevated white blood cell count, unspecified: Secondary | ICD-10-CM

## 2019-06-10 DIAGNOSIS — G8918 Other acute postprocedural pain: Secondary | ICD-10-CM

## 2019-06-10 DIAGNOSIS — K592 Neurogenic bowel, not elsewhere classified: Secondary | ICD-10-CM

## 2019-06-10 DIAGNOSIS — R7401 Elevation of levels of liver transaminase levels: Secondary | ICD-10-CM

## 2019-06-10 MED ORDER — SIMETHICONE 80 MG PO CHEW
80.0000 mg | CHEWABLE_TABLET | Freq: Four times a day (QID) | ORAL | Status: DC
  Administered 2019-06-10 – 2019-06-13 (×14): 80 mg via ORAL
  Filled 2019-06-10 (×14): qty 1

## 2019-06-10 MED ORDER — SIMETHICONE 80 MG PO CHEW: 80.0000 mg | CHEWABLE_TABLET | Freq: Four times a day (QID) | ORAL | Status: DC

## 2019-06-10 NOTE — Plan of Care (Signed)
  Problem: Consults Goal: RH SPINAL CORD INJURY PATIENT EDUCATION Description:  See Patient Education module for education specifics.  Outcome: Progressing   Problem: SCI BLADDER ELIMINATION Goal: RH STG MANAGE BLADDER WITH EQUIPMENT WITH ASSISTANCE Description: STG Manage Bladder With Equipment With mod I Assistance Outcome: Progressing   Problem: RH SKIN INTEGRITY Goal: RH STG SKIN FREE OF INFECTION/BREAKDOWN Outcome: Progressing Goal: RH STG MAINTAIN SKIN INTEGRITY WITH ASSISTANCE Description: STG Maintain Skin Integrity With min Assistance. Outcome: Progressing   Problem: RH SAFETY Goal: RH STG ADHERE TO SAFETY PRECAUTIONS W/ASSISTANCE/DEVICE Description: STG Adhere to Safety Precautions With min Assistance/Device. Outcome: Progressing   Problem: RH PAIN MANAGEMENT Goal: RH STG PAIN MANAGED AT OR BELOW PT'S PAIN GOAL Description: STG pt's pain level less than 4 on scale of 1-10 Outcome: Progressing   Problem: RH KNOWLEDGE DEFICIT SCI Goal: RH STG INCREASE KNOWLEDGE OF SELF CARE AFTER SCI Outcome: Progressing   Problem: SCI BOWEL ELIMINATION Goal: RH STG MANAGE BOWEL WITH ASSISTANCE Description: STG Manage Bowel with min Assistance. Outcome: Not Progressing Goal: RH STG MANAGE BOWEL W/EQUIPMENT W/ASSISTANCE Description: STG Manage Bowel With Equipment With min Assistance Outcome: Not Progressing Goal: RH STG SCI MANAGE BOWEL PROGRAM W/ASSIST OR AS APPROPRIATE Description: STG SCI Manage bowel program w/min assist or as appropriate. Outcome: Not Progressing

## 2019-06-10 NOTE — Progress Notes (Signed)
Occupational Therapy Session Note  Patient Details  Name: Jacob Lambert MRN: [id number] Date of Birth: 1985/04/07  Today's Date: 06/10/2019 OT Individual Time: 7017-7939 OT Individual Time Calculation (min): 75 min    Short Term Goals: Week 1:  OT Short Term Goal 1 (Week 1): Pt will transfer wiht S overall consistently with LRAD to toilet OT Short Term Goal 2 (Week 1): Pt will step over shower edge wiht CGA using LRAD OT Short Term Goal 3 (Week 1): Pt will complete LB dressing with S OT Short Term Goal 4 (Week 1): Pt will complete UB dressing wiht set up  Skilled Therapeutic Interventions/Progress Updates:    Pt received in bed groggy from little sleep and just waking up from a nap. Pt reporting fatigue but still wishing to participate in tx. Pt agreeable ot shower and in recounting days events reports that w/c is too wide. After 2 attempts OT selects better fitting 16x18 w/c that pt is able to propel in hallway with improved ease and comfort and decreased pain in neck as opposed to 20x18. Pt transfers throughout session with MIN A and VC for knee extension and hand placement during transfers EOB<>w/c<>TTB in shower. Pt attempts to bathe but shower hose pulls apart and shower head breaks off of hose. Pt gleeful that they were strong enough to break the hose, however pt bathes peri area, feet and chest swiftly to get out of poorly draining shower. Pt dresses with VC for threading LLE first to don depends, and dons dress with S. Pt and OT discuss rest of the week and pt wishes to explore adaptive yoga (a preferred leisure activity/mindfulness practice) and work using laptops/typing and other aspects of returning to work virtually. OT educates on scope of practice and ways to incorporate these into tx time. Exited session with pt seated in bed, exit alarm on and call light in reach  Therapy Documentation Precautions:  Precautions Precautions: Fall Precaution Booklet Issued: No Precaution Comments:  watch BP and HR Restrictions Weight Bearing Restrictions: No General:   Vital Signs:   Pain: Pain Assessment Pain Scale: 0-10 Pain Score: 5  Pain Type: Acute pain Pain Location: Neck Pain Descriptors / Indicators: Aching Pain Onset: On-going Pain Intervention(s): Medication (See eMAR) ADL: ADL Eating: Supervision/safety Grooming: Minimal assistance Where Assessed-Grooming: Sitting at sink Upper Body Bathing: Supervision/safety Where Assessed-Lower Body Bathing: Edge of bed Upper Body Dressing: Maximal assistance(shoes only) Vision   Perception    Praxis   Exercises:   Other Treatments:     Therapy/Group: Individual Therapy  [name] 06/10/2019, 12:52 PM

## 2019-06-10 NOTE — Progress Notes (Signed)
Occupational Therapy Session Note  Patient Details  Name: Jacob Lambert MRN: [id number] Date of Birth: 12-15-1985  Today's Date: 06/10/2019 OT Individual Time: 6144-3154 OT Individual Time Calculation (min): 77 min    Short Term Goals: Week 1:  OT Short Term Goal 1 (Week 1): Pt will transfer wiht S overall consistently with LRAD to toilet OT Short Term Goal 2 (Week 1): Pt will step over shower edge wiht CGA using LRAD OT Short Term Goal 3 (Week 1): Pt will complete LB dressing with S OT Short Term Goal 4 (Week 1): Pt will complete UB dressing wiht set up  Skilled Therapeutic Interventions/Progress Updates:    1;1. Pt received in bed agreeable to OT. Pt completes stand pivot transfer without AD using bed rails EOB<>w/c with VC for knee extension and hand placemetn. Pt reporting wanting to eat breakfast and practice with a knife and fork cutting up food. Pt uses both light weight utensils and steak knife (heavier) to explore decreased sensation impacts of LUE (pt is left handed). Pt able to manipulate both and pleased with result. Pt completes donning dress overhead with set up, pants with VC for threading LLE first and sit to stand with MIN A while pt advances pants past hips (B knees bent). OT demo use of plastic bag over feet and pt able to don B socks in figure 4. Pt stands at washing machine to load laundry with VC for postural alignment. BP WNL thoruhgout session and HR 106-123 with activity. Exited session with pt seated in bed, exit alram on and call lightin reac  Therapy Documentation Precautions:  Precautions Precautions: Fall Precaution Booklet Issued: No Precaution Comments: watch BP and HR Restrictions Weight Bearing Restrictions: No General:   Vital Signs: Therapy Vitals Temp: 99.2 F (37.3 C) Temp Source: Oral Pulse Rate: 94 Resp: 16 BP: 124/88 Patient Position (if appropriate): Lying Oxygen Therapy SpO2: 100 % O2 Device: Room Air Pain: Pain Assessment Pain Scale:  0-10 Pain Score: 5  Pain Type: Acute pain Pain Location: Neck Pain Descriptors / Indicators: Aching Pain Onset: On-going Pain Intervention(s): Medication (See eMAR) ADL: ADL Eating: Supervision/safety Grooming: Minimal assistance Where Assessed-Grooming: Sitting at sink Upper Body Bathing: Supervision/safety Where Assessed-Lower Body Bathing: Edge of bed Upper Body Dressing: Maximal assistance(shoes only) Vision   Perception    Praxis   Exercises:   Other Treatments:     Therapy/Group: Individual Therapy  [name] 06/10/2019, 9:16 AM

## 2019-06-10 NOTE — Progress Notes (Signed)
Physical Therapy Session Note  Patient Details  Name: Jacob Lambert MRN: [id number] Date of Birth: Sep 21, 1985  Today's Date: 06/10/2019 PT Individual Time: 1100-1200 PT Individual Time Calculation (min): 60 min   Short Term Goals: Week 1:  PT Short Term Goal 1 (Week 1): Pt will complete transfers with Supervision and LRAD PT Short Term Goal 2 (Week 1): Pt will ambulate x 150 ft with RW at Supervision level PT Short Term Goal 3 (Week 1): Pt will initiate stair training as safe and able  Skilled Therapeutic Interventions/Progress Updates:    Pt received supine in bed, agreeable to PT session. Pt reports 7/10 pain in neck from frequent rolling for pericare as well as ongoing pain in L knee, knee pain improved from previous date. Bed mobility at Supervision level. Resting heart rate 90 bpm. Sit to stand and SPT with RW at min A level. Pt is at Supervision level for w/c mobility up to 150 ft with use of BUE and intermittent use of BLE for global endurance training. Pt is able to transfer their laundry from the washer to the dryer while seated in w/c and then stand with min A to dryer in order to adjust setting and start the dryer. Ascend/descend 8 x 3" stairs with 2 handrails and min A, step-to gait pattern. Pt initially requires max cueing for LE placement on step safely due to impaired LE proprioception. Once pt is able to visualize their LE they exhibit improved ability to safely place LE on the step. Ascend/descend 4 x 6" stairs with 2 handrails and min A, step-to gait pattern. Pt's heart rate following stair navigation is 136 bpm. Ambulation x 200 ft with RW and min A, ongoing LE ataxia and scissoring of RLE, decreased toe clearance with onset of fatigue. Pt reports LE "tingle" and "vibrate" at rest following ambulation. Pt also reports feeling lightheaded following gait but symptoms improve with seated rest break. Standing BLE coordination tasks: alt L/R 4" step-taps with RW and min A for balance,  decreased control of LLE as compared to RLE; alt L/R cone taps with RW and min A for balance. Pt requests to return to bed at end of session. Stand pivot transfer w/c to bed with RW and min A. Bed mobility with Supervision. Pt left supine in bed with needs in reach, bed alarm in place at end of session.  Therapy Documentation Precautions:  Precautions Precautions: Fall Precaution Booklet Issued: No Precaution Comments: watch BP and HR Restrictions Weight Bearing Restrictions: No    Therapy/Group: Individual Therapy   Excell Seltzer, PT, DPT  06/10/2019, 12:50 PM

## 2019-06-10 NOTE — Progress Notes (Addendum)
[place] PHYSICAL MEDICINE & REHABILITATION PROGRESS NOTE   Subjective/Complaints: Patient seen sitting up in bed this morning.  They state they did not sleep well overnight due to late acting suppository and subsequent bowel movements.  Overnight called regarding gas, simethicone ordered along with KUB.  Lengthy discussion with patient regarding bowel program/bowel movements.  They state they had a good first day of therapies yesterday except an episode of incontinence.  ROS: Denies CP, shortness of breath, nausea, vomiting    Objective:   DG Abd Portable 1V  Result Date: 06/10/2019 CLINICAL DATA:  Abdominal pain EXAM: PORTABLE ABDOMEN - 1 VIEW COMPARISON:  None. FINDINGS: Diffuse gaseous distension of bowel without over distension or obstructive pattern. Gas reaches the rectum. No concerning mass effect or gas collection. Left pelvic calcifications are usually phleboliths. IMPRESSION: Diffuse bowel gas without obstructive pattern. Electronically Signed   By: Monte Fantasia M.D.   On: 06/10/2019 07:17   VAS [place] LOWER EXTREMITY VENOUS (DVT)  Result Date: 06/09/2019  Lower Venous DVTStudy Indications: Edema.  Comparison Study: No prior study Performing Technologist: Maudry Mayhew MHA, RDMS, RVT, RDCS  Examination Guidelines: A complete evaluation includes B-mode imaging, spectral Doppler, color Doppler, and power Doppler as needed of all accessible portions of each vessel. Bilateral testing is considered an integral part of a complete examination. Limited examinations for reoccurring indications may be performed as noted. The reflux portion of the exam is performed with the patient in reverse Trendelenburg.  +---------+---------------+---------+-----------+----------+--------------+ RIGHT    CompressibilityPhasicitySpontaneityPropertiesThrombus Aging +---------+---------------+---------+-----------+----------+--------------+ CFV      Full           Yes      Yes                                  +---------+---------------+---------+-----------+----------+--------------+ SFJ      Full                                                        +---------+---------------+---------+-----------+----------+--------------+ FV Prox  Full                                                        +---------+---------------+---------+-----------+----------+--------------+ FV Mid   Full                                                        +---------+---------------+---------+-----------+----------+--------------+ FV DistalFull                                                        +---------+---------------+---------+-----------+----------+--------------+ PFV      Full                                                        +---------+---------------+---------+-----------+----------+--------------+  POP      Full           Yes      Yes                                 +---------+---------------+---------+-----------+----------+--------------+ PTV      Full                                                        +---------+---------------+---------+-----------+----------+--------------+ PERO     Full                                                        +---------+---------------+---------+-----------+----------+--------------+   +---------+---------------+---------+-----------+----------+--------------+ LEFT     CompressibilityPhasicitySpontaneityPropertiesThrombus Aging +---------+---------------+---------+-----------+----------+--------------+ CFV      Full           Yes      Yes                                 +---------+---------------+---------+-----------+----------+--------------+ SFJ      Full                                                        +---------+---------------+---------+-----------+----------+--------------+ FV Prox  Full                                                         +---------+---------------+---------+-----------+----------+--------------+ FV Mid   Full                                                        +---------+---------------+---------+-----------+----------+--------------+ FV DistalFull                                                        +---------+---------------+---------+-----------+----------+--------------+ PFV      Full                                                        +---------+---------------+---------+-----------+----------+--------------+ POP      Full           Yes      Yes                                 +---------+---------------+---------+-----------+----------+--------------+  PTV      Full                                                        +---------+---------------+---------+-----------+----------+--------------+ PERO     Full                                                        +---------+---------------+---------+-----------+----------+--------------+     Summary: RIGHT: - There is no evidence of deep vein thrombosis in the lower extremity.  - No cystic structure found in the popliteal fossa.  LEFT: - There is no evidence of deep vein thrombosis in the lower extremity.  - No cystic structure found in the popliteal fossa.  *See table(s) above for measurements and observations. Electronically signed by Ruta Hinds MD on 06/09/2019 at 7:37:44 PM.    Final    Recent Labs    06/09/19 0657  WBC 13.8*  HGB 13.0  HCT 41.5  PLT 376   Recent Labs    06/09/19 0657  NA 138  K 4.0  CL 100  CO2 28  GLUCOSE 96  BUN 8  CREATININE 0.94  CALCIUM 9.0    Intake/Output Summary (Last 24 hours) at 06/10/2019 1550 Last data filed at 06/10/2019 1446 Gross per 24 hour  Intake 1495 ml  Output 3750 ml  Net -2255 ml     Physical Exam: Vital Signs Blood pressure 129/77, pulse 100, temperature 99 F (37.2 C), temperature source Oral, resp. rate 17, height 5' 10.5" (1.791 m), weight 70.6 kg,  SpO2 100 %. Constitutional: No distress . Vital signs reviewed. HENT: Normocephalic.  Atraumatic. Eyes: EOMI. No discharge. Cardiovascular: No JVD. Respiratory: Normal effort.  No stridor. GI: Non-distended. Skin: Warm and dry.  Intact on visible skin. Psych: Normal mood.  Normal behavior. Musc: No edema in extremities.  No tenderness in extremities. Neuro: Alert Motor: Right upper extremity: Grossly 4+-5/5 proximal distal Left upper extremity: 4 -/5 proximal distal Right lower extremity: Hip flexion, knee extension, ankle dorsiflexion 4 -/5 proximal distal Left lower extremity: Hip flexion, knee extension 3 -/5, ankle dorsiflexion 3+/5  Assessment/Plan: 1. 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  Physiatrist is providing close team supervision and 24 hour management of active medical problems listed below.  Physiatrist and rehab team continue to assess barriers to discharge/monitor patient progress toward functional and medical goals  Care Tool:  Bathing    Body parts bathed by patient: Right arm, Left arm, Chest, Abdomen, Front perineal area, Right upper leg, Left upper leg, Right lower leg, Left lower leg, Face   Body parts bathed by helper: Buttocks     Bathing assist Assist Level: Minimal Assistance - Patient > 75%     Upper Body Dressing/Undressing Upper body dressing   What is the patient wearing?: Dress    Upper body assist Assist Level: Contact Guard/Touching assist    Lower Body Dressing/Undressing Lower body dressing      What is the patient wearing?: Incontinence brief     Lower body assist Assist  for lower body dressing: Dependent - Patient 0%     Toileting Toileting    Toileting assist Assist for toileting: Moderate Assistance - Patient 50 - 74% Assistive Device Comment: urinal   Transfers Chair/bed  transfer  Transfers assist     Chair/bed transfer assist level: Minimal Assistance - Patient > 75%     Locomotion Ambulation   Ambulation assist      Assist level: Minimal Assistance - Patient > 75% Assistive device: Walker-rolling Max distance: 200'   Walk 10 feet activity   Assist     Assist level: Minimal Assistance - Patient > 75% Assistive device: Walker-rolling   Walk 50 feet activity   Assist    Assist level: Minimal Assistance - Patient > 75% Assistive device: Walker-rolling    Walk 150 feet activity   Assist    Assist level: Minimal Assistance - Patient > 75% Assistive device: Walker-rolling    Walk 10 feet on uneven surface  activity   Assist Walk 10 feet on uneven surfaces activity did not occur: Safety/medical concerns         Wheelchair     Assist Will patient use wheelchair at discharge?: (TBD) Type of Wheelchair: Manual    Wheelchair assist level: Supervision/Verbal cueing Max wheelchair distance: 150'    Wheelchair 50 feet with 2 turns activity    Assist        Assist Level: Supervision/Verbal cueing   Wheelchair 150 feet activity     Assist      Assist Level: Supervision/Verbal cueing   Blood pressure 129/77, pulse 100, temperature 99 F (37.2 C), temperature source Oral, resp. rate 17, height 5' 10.5" (1.791 m), weight 70.6 kg, SpO2 100 %.  Medical Problem List and Plan: 1.  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  Status post C6-7 laminoplasty resection of spinal cord tumor 06/01/2019.  Decadron taper completed  Continue CIR 2.  Antithrombotics: -DVT/anticoagulation: Lovenox.  Will need for 2-3 months depending on walking ability  Vascular study negative for DVT             -antiplatelet therapy: N/A 3. Pain Management: Neurontin 600 mg 3 times daily, Lidoderm patch as directed, Valium 10 mg every 6 hours as  needed muscle spasms, Robaxin 750 mg every 6 hours as needed muscle spasms, oxycodone as needed  Controlled on 6/5 4. Mood: - don't think pt needs meds- will add if needed             -antipsychotic agents: N/A 5. Neuropsych: This patient is capable of making decisions on their own behalf. 6. Skin/Wound Care: Routine skin checks 7. Fluids/Electrolytes/Nutrition: Routine in and outs  BMP within acceptable range on 6/4 8.  Neurogenic bowel.    Added suppository nightly and digital stimulation of rectum to improve constipation and bowel movements- if recovers ability to have BMs on their own, will stop.   ?  Improving 9. Minimal PVRs- will not reorder PVRs- voiding OK-  10. Spasticity- explained it's normal that it gets worse over time- is very mild right now- will monitor 11. Leukocytosis  WBCs 13.8 on 6/4, labs ordered for Monday  Afebrile 12.  Transaminitis  LFTs elevated on 6/4, labs ordered for Monday  LOS: 2 days A FACE TO FACE EVALUATION WAS PERFORMED  Jacob Lambert Jacob Lambert 06/10/2019, 3:50 PM

## 2019-06-10 NOTE — Progress Notes (Signed)
Pt c/o abdominal pain during and after having a large bowel movement. Pt states his abdomen feels better after having a bowel movement but stills feels like he may have gas or stool that cannot be passed.  No more stool near rectum or passed at this time. Dr. Delice Lesch called. Simethicone and KUB ordered.

## 2019-06-10 NOTE — Progress Notes (Signed)
Dulcolax rectal suppository administered at 2003. Dig stimulation performed. No bowel movement at this time.

## 2019-06-11 ENCOUNTER — Inpatient Hospital Stay (HOSPITAL_COMMUNITY): Payer: BC Managed Care – PPO

## 2019-06-11 MED ORDER — TOPIRAMATE 25 MG PO TABS
25.0000 mg | ORAL_TABLET | Freq: Every day | ORAL | Status: DC
  Administered 2019-06-11 – 2019-06-13 (×3): 25 mg via ORAL
  Filled 2019-06-11 (×3): qty 1

## 2019-06-11 MED ORDER — BISACODYL 10 MG RE SUPP
10.0000 mg | Freq: Every day | RECTAL | Status: DC
  Administered 2019-06-11 – 2019-06-13 (×3): 10 mg via RECTAL
  Filled 2019-06-11 (×3): qty 1

## 2019-06-11 NOTE — Progress Notes (Signed)
No PRN meds given or requested. Dig stim before supp, without results. Good rectal tone. Incontinent of urine X 2 and small BM.  Declined need to be toileted every 3 hours at night. Reports "migraine headache" this AM, requesting med specific for migraine headache. Reports one episode of "severe" tremors in right leg. Jacob Lambert A

## 2019-06-11 NOTE — Progress Notes (Signed)
Occupational Therapy Session Note  Patient Details  Name: Jacob Lambert MRN: [id number] Date of Birth: 1985/10/22  Today's Date: 06/11/2019 OT Individual Time: 1000-1100 OT Individual Time Calculation (min): 60 min    Short Term Goals: Week 1:  OT Short Term Goal 1 (Week 1): Pt will transfer wiht S overall consistently with LRAD to toilet OT Short Term Goal 2 (Week 1): Pt will step over shower edge wiht CGA using LRAD OT Short Term Goal 3 (Week 1): Pt will complete LB dressing with S OT Short Term Goal 4 (Week 1): Pt will complete UB dressing wiht set up  Skilled Therapeutic Interventions/Progress Updates:    Pt resting in bed upon arrival and "eager" for therapy.  OT intervention with focus on bed mobility, stand pivot transfers, BUE therex, BLE therex, w/c mobility, and discharge planning to increase independence with BADLs/IADLs. Supine>sit EOB with supervision.  Stand pivot tranfser without AD with CGA. Pt initially engaged in BLE therex on NuStep (work load 5, 8 mins, 55 SPM). Pt propelled w/c to gym and transferred to EOM.  Pt engaged in BUE therex with 3# bar (3x10 straight arm raises and chest presses) and 3# barbell in each UE (3X10 curls). Pt noted spasms in RUE during straight arm raises. Pt propelled w/c around unit with min verbal cues for safety awareness.  Pt not always aware of envirionment on R secondary to R eye visual deficits.  Discussed importance of scanning envirionment. Pt returned to room transferred back to bed.  Pt remained in bed with all needs within reach and bed alarm activated.  Therapy Documentation Precautions:  Precautions Precautions: Fall Precaution Booklet Issued: No Precaution Comments: watch BP and HR Restrictions Weight Bearing Restrictions: No  Pain: Pt denies pain this morning  Therapy/Group: Individual Therapy  [name] 06/11/2019, 12:25 PM

## 2019-06-11 NOTE — Progress Notes (Signed)
Patient temp noted to be 100.1 this afternoon. [place] green-other vitals stable. Patient reports feeling a little "achy" and noted to have some perspiration. MD Posey Pronto notified. No additional orders received. Posterior neck incision assessed. No swelling or drainage noted. Lidoderm patch applied as ordered. PRN meds given-documented in [facility].  Patient voided in urinal x2. Lab work scheduled for the a.m. Continue to monitor.    Addendum:  At 1707 temp rechecked-99.6 Pain reassessment completed. Continue with plan of care.

## 2019-06-11 NOTE — Progress Notes (Addendum)
[facility] PHYSICAL MEDICINE & REHABILITATION PROGRESS NOTE   Subjective/Complaints: Patient seen laying in bed this morning.  They state that they slept better overnight.  They state they had a bowel movement, but they are improving.  Mention 1 episode of leg spasm and urinary incontinence yesterday.  Asks to schedule an appointment with Rad/Onc so that everything can hopefully be completed by September.  Later notified by nursing regarding complaint of migraine and request for migraine specific medication.  They do not want a try Tylenol again.  ROS: Denies CP, shortness of breath, nausea, vomiting   Objective:   DG Abd Portable 1V  Result Date: 06/10/2019 CLINICAL DATA:  Abdominal pain EXAM: PORTABLE ABDOMEN - 1 VIEW COMPARISON:  None. FINDINGS: Diffuse gaseous distension of bowel without over distension or obstructive pattern. Gas reaches the rectum. No concerning mass effect or gas collection. Left pelvic calcifications are usually phleboliths. IMPRESSION: Diffuse bowel gas without obstructive pattern. Electronically Signed   By: Monte Fantasia M.D.   On: 06/10/2019 07:17   VAS [place] LOWER EXTREMITY VENOUS (DVT)  Result Date: 06/09/2019  Lower Venous DVTStudy Indications: Edema.  Comparison Study: No prior study Performing Technologist: Maudry Mayhew MHA, RDMS, RVT, RDCS  Examination Guidelines: A complete evaluation includes B-mode imaging, spectral Doppler, color Doppler, and power Doppler as needed of all accessible portions of each vessel. Bilateral testing is considered an integral part of a complete examination. Limited examinations for reoccurring indications may be performed as noted. The reflux portion of the exam is performed with the patient in reverse Trendelenburg.  +---------+---------------+---------+-----------+----------+--------------+ RIGHT    CompressibilityPhasicitySpontaneityPropertiesThrombus Aging  +---------+---------------+---------+-----------+----------+--------------+ CFV      Full           Yes      Yes                                 +---------+---------------+---------+-----------+----------+--------------+ SFJ      Full                                                        +---------+---------------+---------+-----------+----------+--------------+ FV Prox  Full                                                        +---------+---------------+---------+-----------+----------+--------------+ FV Mid   Full                                                        +---------+---------------+---------+-----------+----------+--------------+ FV DistalFull                                                        +---------+---------------+---------+-----------+----------+--------------+ PFV      Full                                                        +---------+---------------+---------+-----------+----------+--------------+  POP      Full           Yes      Yes                                 +---------+---------------+---------+-----------+----------+--------------+ PTV      Full                                                        +---------+---------------+---------+-----------+----------+--------------+ PERO     Full                                                        +---------+---------------+---------+-----------+----------+--------------+   +---------+---------------+---------+-----------+----------+--------------+ LEFT     CompressibilityPhasicitySpontaneityPropertiesThrombus Aging +---------+---------------+---------+-----------+----------+--------------+ CFV      Full           Yes      Yes                                 +---------+---------------+---------+-----------+----------+--------------+ SFJ      Full                                                         +---------+---------------+---------+-----------+----------+--------------+ FV Prox  Full                                                        +---------+---------------+---------+-----------+----------+--------------+ FV Mid   Full                                                        +---------+---------------+---------+-----------+----------+--------------+ FV DistalFull                                                        +---------+---------------+---------+-----------+----------+--------------+ PFV      Full                                                        +---------+---------------+---------+-----------+----------+--------------+ POP      Full           Yes      Yes                                 +---------+---------------+---------+-----------+----------+--------------+  PTV      Full                                                        +---------+---------------+---------+-----------+----------+--------------+ PERO     Full                                                        +---------+---------------+---------+-----------+----------+--------------+     Summary: RIGHT: - There is no evidence of deep vein thrombosis in the lower extremity.  - No cystic structure found in the popliteal fossa.  LEFT: - There is no evidence of deep vein thrombosis in the lower extremity.  - No cystic structure found in the popliteal fossa.  *See table(s) above for measurements and observations. Electronically signed by Ruta Hinds MD on 06/09/2019 at 7:37:44 PM.    Final    Recent Labs    06/09/19 0657  WBC 13.8*  HGB 13.0  HCT 41.5  PLT 376   Recent Labs    06/09/19 0657  NA 138  K 4.0  CL 100  CO2 28  GLUCOSE 96  BUN 8  CREATININE 0.94  CALCIUM 9.0    Intake/Output Summary (Last 24 hours) at 06/11/2019 0932 Last data filed at 06/11/2019 5277 Gross per 24 hour  Intake 535 ml  Output 3125 ml  Net -2590 ml     Physical Exam: Vital  Signs Blood pressure 112/67, pulse (!) 108, temperature 97.9 F (36.6 C), temperature source Oral, resp. rate 16, height 5' 10.5" (1.791 m), weight 70.6 kg, SpO2 98 %. Constitutional: No distress . Vital signs reviewed. HENT: Normocephalic.  Atraumatic. Eyes: EOMI. No discharge. Cardiovascular: No JVD.  RRR. Respiratory: Normal effort.  No stridor.  Bilaterally clear to auscultation. GI: Non-distended.  BS +. Skin: Warm and dry.  Intact. Psych: Normal mood.  Normal behavior. Musc: No edema in extremities.  No tenderness in extremities. Neuro: Alert Facial muscles intact Motor: Right upper extremity: Grossly 4+-5/5 proximal distal Left upper extremity: 4 -/5 proximal distal Right lower extremity: Hip flexion, knee extension, ankle dorsiflexion 4 -/5 proximal distal Left lower extremity: Hip flexion, knee extension 3 -/5, ankle dorsiflexion 3+/5  Assessment/Plan: 1. 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  Physiatrist is providing close team supervision and 24 hour management of active medical problems listed below.  Physiatrist and rehab team continue to assess barriers to discharge/monitor patient progress toward functional and medical goals  Care Tool:  Bathing    Body parts bathed by patient: Right arm, Left arm, Chest, Abdomen, Front perineal area, Right upper leg, Left upper leg, Right lower leg, Left lower leg, Face   Body parts bathed by helper: Buttocks     Bathing assist Assist Level: Minimal Assistance - Patient > 75%     Upper Body Dressing/Undressing Upper body dressing   What is the patient wearing?: Dress    Upper body assist Assist Level: Contact Guard/Touching assist    Lower Body Dressing/Undressing Lower body dressing      What is the patient  wearing?: Incontinence brief     Lower body assist Assist for  lower body dressing: Dependent - Patient 0%     Toileting Toileting    Toileting assist Assist for toileting: Dependent - Patient 0% Assistive Device Comment: urinal   Transfers Chair/bed transfer  Transfers assist     Chair/bed transfer assist level: Minimal Assistance - Patient > 75%     Locomotion Ambulation   Ambulation assist      Assist level: Minimal Assistance - Patient > 75% Assistive device: Walker-rolling Max distance: 200'   Walk 10 feet activity   Assist     Assist level: Minimal Assistance - Patient > 75% Assistive device: Walker-rolling   Walk 50 feet activity   Assist    Assist level: Minimal Assistance - Patient > 75% Assistive device: Walker-rolling    Walk 150 feet activity   Assist    Assist level: Minimal Assistance - Patient > 75% Assistive device: Walker-rolling    Walk 10 feet on uneven surface  activity   Assist Walk 10 feet on uneven surfaces activity did not occur: Safety/medical concerns         Wheelchair     Assist Will patient use wheelchair at discharge?: (TBD) Type of Wheelchair: Manual    Wheelchair assist level: Supervision/Verbal cueing Max wheelchair distance: 150'    Wheelchair 50 feet with 2 turns activity    Assist        Assist Level: Supervision/Verbal cueing   Wheelchair 150 feet activity     Assist      Assist Level: Supervision/Verbal cueing   Blood pressure 112/67, pulse (!) 108, temperature 97.9 F (36.6 C), temperature source Oral, resp. rate 16, height 5' 10.5" (1.791 m), weight 70.6 kg, SpO2 98 %.  Medical Problem List and Plan: 1.  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  Status post C6-7 laminoplasty resection of spinal cord tumor 06/01/2019.  Decadron taper completed  Continue CIR 2.  Antithrombotics: -DVT/anticoagulation: Lovenox.  Will need for 2-3 months depending on  walking ability  Vascular study negative for DVT             -antiplatelet therapy: N/A 3. Pain Management: Neurontin 600 mg 3 times daily, Lidoderm patch as directed, Valium 10 mg every 6 hours as needed muscle spasms, Robaxin 750 mg every 6 hours as needed muscle spasms, oxycodone as needed  Topamax 25 daily started on 6/6 for migraines per patient request  4. Mood: - don't think pt needs meds- will add if needed             -antipsychotic agents: N/A 5. Neuropsych: This patient is capable of making decisions on their own behalf. 6. Skin/Wound Care: Routine skin checks 7. Fluids/Electrolytes/Nutrition: Routine in and outs  BMP within acceptable range on 6/4 8.  Neurogenic bowel.    Added suppository nightly and digital stimulation of rectum to improve constipation and bowel movements- if recovers ability to have BMs on their own, will stop.   Appears to be improving 9. Minimal PVRs- will not reorder PVRs- voiding OK-  10. Spasticity- explained it's normal that it gets worse over time- is very mild right now- will monitor 11. Leukocytosis  WBCs 13.8 on 6/4, labs ordered for tomorrow  Afebrile 12.  Transaminitis  LFTs elevated on 6/4, labs ordered for tomorrow  LOS: 3 days A FACE TO FACE EVALUATION WAS PERFORMED  Demontae Antunes Lorie Phenix 06/11/2019, 9:32  AM

## 2019-06-11 NOTE — Plan of Care (Signed)
  Problem: Consults Goal: RH SPINAL CORD INJURY PATIENT EDUCATION Description:  See Patient Education module for education specifics.  Outcome: Progressing   Problem: SCI BOWEL ELIMINATION Goal: RH STG MANAGE BOWEL WITH ASSISTANCE Description: STG Manage Bowel with min Assistance. Outcome: Progressing Goal: RH STG MANAGE BOWEL W/EQUIPMENT W/ASSISTANCE Description: STG Manage Bowel With Equipment With min Assistance Outcome: Progressing Goal: RH STG SCI MANAGE BOWEL PROGRAM W/ASSIST OR AS APPROPRIATE Description: STG SCI Manage bowel program w/min assist or as appropriate. Outcome: Progressing   Problem: SCI BLADDER ELIMINATION Goal: RH STG MANAGE BLADDER WITH EQUIPMENT WITH ASSISTANCE Description: STG Manage Bladder With Equipment With mod I Assistance Outcome: Progressing   Problem: RH SKIN INTEGRITY Goal: RH STG SKIN FREE OF INFECTION/BREAKDOWN Outcome: Progressing Goal: RH STG MAINTAIN SKIN INTEGRITY WITH ASSISTANCE Description: STG Maintain Skin Integrity With min Assistance. Outcome: Progressing   Problem: RH SAFETY Goal: RH STG ADHERE TO SAFETY PRECAUTIONS W/ASSISTANCE/DEVICE Description: STG Adhere to Safety Precautions With min Assistance/Device. Outcome: Progressing   Problem: RH PAIN MANAGEMENT Goal: RH STG PAIN MANAGED AT OR BELOW PT'S PAIN GOAL Description: STG pt's pain level less than 4 on scale of 1-10 Outcome: Progressing   Problem: RH KNOWLEDGE DEFICIT SCI Goal: RH STG INCREASE KNOWLEDGE OF SELF CARE AFTER SCI Outcome: Progressing

## 2019-06-12 ENCOUNTER — Inpatient Hospital Stay (HOSPITAL_COMMUNITY): Payer: BC Managed Care – PPO | Admitting: Physical Therapy

## 2019-06-12 ENCOUNTER — Inpatient Hospital Stay (HOSPITAL_COMMUNITY): Payer: BC Managed Care – PPO

## 2019-06-12 LAB — COMPREHENSIVE METABOLIC PANEL
ALT: 195 U/L — ABNORMAL HIGH (ref 0–44)
AST: 58 U/L — ABNORMAL HIGH (ref 15–41)
Albumin: 3 g/dL — ABNORMAL LOW (ref 3.5–5.0)
Alkaline Phosphatase: 124 U/L (ref 38–126)
Anion gap: 9 (ref 5–15)
BUN: 11 mg/dL (ref 6–20)
CO2: 25 mmol/L (ref 22–32)
Calcium: 8.9 mg/dL (ref 8.9–10.3)
Chloride: 102 mmol/L (ref 98–111)
Creatinine, Ser: 1.04 mg/dL (ref 0.61–1.24)
GFR calc Af Amer: >60 mL/min (ref >60)
GFR calc non Af Amer: >60 mL/min (ref >60)
Glucose, Bld: 106 mg/dL — ABNORMAL HIGH (ref 70–99)
Potassium: 4.1 mmol/L (ref 3.5–5.1)
Sodium: 136 mmol/L (ref 135–145)
Total Bilirubin: 0.3 mg/dL (ref 0.3–1.2)
Total Protein: 8 g/dL (ref 6.5–8.1)

## 2019-06-12 LAB — CBC WITH DIFFERENTIAL/PLATELET
Abs Immature Granulocytes: 0.12 10*3/uL — ABNORMAL HIGH (ref 0.00–0.07)
Basophils Absolute: 0 10*3/uL (ref 0.0–0.1)
Basophils Relative: 0 %
Eosinophils Absolute: 0.3 10*3/uL (ref 0.0–0.5)
Eosinophils Relative: 2 %
HCT: 42.2 % (ref 39.0–52.0)
Hemoglobin: 13.5 g/dL (ref 13.0–17.0)
Immature Granulocytes: 1 %
Lymphocytes Relative: 18 %
Lymphs Abs: 2.2 10*3/uL (ref 0.7–4.0)
MCH: 27.8 pg (ref 26.0–34.0)
MCHC: 32 g/dL (ref 30.0–36.0)
MCV: 87 fL (ref 80.0–100.0)
Monocytes Absolute: 1 10*3/uL (ref 0.1–1.0)
Monocytes Relative: 8 %
Neutro Abs: 8.2 10*3/uL — ABNORMAL HIGH (ref 1.7–7.7)
Neutrophils Relative %: 71 %
Platelets: 382 10*3/uL (ref 150–400)
RBC: 4.85 MIL/uL (ref 4.22–5.81)
RDW: 13.2 % (ref 11.5–15.5)
WBC: 11.7 10*3/uL — ABNORMAL HIGH (ref 4.0–10.5)
nRBC: 0 % (ref 0.0–0.2)

## 2019-06-12 MED ORDER — POLYETHYLENE GLYCOL 3350 17 G PO PACK
17.0000 g | PACK | Freq: Every day | ORAL | Status: DC
  Administered 2019-06-12 – 2019-06-23 (×11): 17 g via ORAL
  Filled 2019-06-12 (×12): qty 1

## 2019-06-12 MED ORDER — METOPROLOL TARTRATE 12.5 MG HALF TABLET
12.5000 mg | ORAL_TABLET | Freq: Two times a day (BID) | ORAL | Status: DC
  Administered 2019-06-12 – 2019-06-18 (×13): 12.5 mg via ORAL
  Filled 2019-06-12 (×13): qty 1

## 2019-06-12 NOTE — Progress Notes (Signed)
[facility] PHYSICAL MEDICINE & REHABILITATION PROGRESS NOTE   Subjective/Complaints:  Pt reports doing well overall- bowel program not working well for them- having results far after bowel program- after midnight and then cannot go back to sleep- already added simethicone and topamax for HA's.  Staff report pt's pulse is running ~130 at rest- will add metoprolol for tachycardia- pt OK with this.    ROS:  Pt denies SOB, abd pain, CP, N/V/C/D, and vision changes  Objective:   No results found. Recent Labs    06/12/19 0858  WBC 11.7*  HGB 13.5  HCT 42.2  PLT 382   Recent Labs    06/12/19 0800  NA 136  K 4.1  CL 102  CO2 25  GLUCOSE 106*  BUN 11  CREATININE 1.04  CALCIUM 8.9    Intake/Output Summary (Last 24 hours) at 06/12/2019 1134 Last data filed at 06/12/2019 0900 Gross per 24 hour  Intake 718 ml  Output 1850 ml  Net -1132 ml     Physical Exam: Vital Signs Blood pressure 115/80, pulse 98, temperature 98.5 F (36.9 C), temperature source Oral, resp. rate 16, height 5' 10.5" (1.791 m), weight 70.6 kg, SpO2 99 %. Constitutional: No distress . Vital signs reviewed. Pt is laying in bed with legs to their chest, appropriate, NAD HENT: Normocephalic.  Atraumatic. Eyes: conjugate gaze Cardiovascular: tachycardic- 110s-120s. Respiratory: CTA B/L- no W/R/R- good air movement GI: Soft, NT, ND, (+)BS . Skin: Warm and dry.  Intact. Psych: Normal mood.  Normal behavior. Musc: No edema in extremities.  No tenderness in extremities. Neuro: Alert Facial muscles intact Motor: Right upper extremity: Grossly 4+-5/5 proximal distal Left upper extremity: 4 -/5 proximal distal Right lower extremity: Hip flexion, knee extension, ankle dorsiflexion 4 -/5 proximal distal Left lower extremity: Hip flexion, knee extension 3 -/5, ankle dorsiflexion 3+/5  Assessment/Plan: 1. 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  Physiatrist is providing close team supervision and 24 hour management of active medical problems listed below.  Physiatrist and rehab team continue to assess barriers to discharge/monitor patient progress toward functional and medical goals  Care Tool:  Bathing    Body parts bathed by patient: Right arm, Left arm, Chest, Abdomen, Front perineal area, Right upper leg, Left upper leg, Right lower leg, Left lower leg, Face   Body parts bathed by helper: Buttocks     Bathing assist Assist Level: Minimal Assistance - Patient > 75%     Upper Body Dressing/Undressing Upper body dressing   What is the patient wearing?: Dress    Upper body assist Assist Level: Contact Guard/Touching assist    Lower Body Dressing/Undressing Lower body dressing      What is the patient wearing?: Incontinence brief     Lower body assist Assist for lower body dressing: Dependent - Patient 0%     Toileting Toileting    Toileting assist Assist for toileting: Dependent - Patient 0% Assistive Device Comment: urinal   Transfers Chair/bed transfer  Transfers assist     Chair/bed transfer assist level: Minimal Assistance - Patient > 75%     Locomotion Ambulation   Ambulation assist      Assist level: Minimal Assistance - Patient > 75% Assistive device: Walker-rolling Max distance: 200'   Walk 10 feet activity   Assist     Assist level: Minimal Assistance - Patient >  75% Assistive device: Walker-rolling   Walk 50 feet activity   Assist    Assist level: Minimal Assistance - Patient > 75% Assistive device: Walker-rolling    Walk 150 feet activity   Assist    Assist level: Minimal Assistance - Patient > 75% Assistive device: Walker-rolling    Walk 10 feet on uneven surface  activity   Assist Walk 10 feet on uneven surfaces activity did not occur: Safety/medical concerns          Wheelchair     Assist Will patient use wheelchair at discharge?: (TBD) Type of Wheelchair: Manual    Wheelchair assist level: Supervision/Verbal cueing Max wheelchair distance: 150'    Wheelchair 50 feet with 2 turns activity    Assist        Assist Level: Supervision/Verbal cueing   Wheelchair 150 feet activity     Assist      Assist Level: Supervision/Verbal cueing   Blood pressure 115/80, pulse 98, temperature 98.5 F (36.9 C), temperature source Oral, resp. rate 16, height 5' 10.5" (1.791 m), weight 70.6 kg, SpO2 99 %.  Medical Problem List and Plan: 1.  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  Status post C6-7 laminoplasty resection of spinal cord tumor 06/01/2019.  Decadron taper completed  Continue CIR 2.  Antithrombotics: -DVT/anticoagulation: Lovenox.  Will need for 2-3 months depending on walking ability  Vascular study negative for DVT             -antiplatelet therapy: N/A 3. Pain Management: Neurontin 600 mg 3 times daily, Lidoderm patch as directed, Valium 10 mg every 6 hours as needed muscle spasms, Robaxin 750 mg every 6 hours as needed muscle spasms, oxycodone as needed  Topamax 25 daily started on 6/6 for migraines per patient request  4. Mood: - don't think pt needs meds- will add if needed             -antipsychotic agents: N/A 5. Neuropsych: This patient is capable of making decisions on their own behalf. 6. Skin/Wound Care: Routine skin checks 7. Fluids/Electrolytes/Nutrition: Routine in and outs  BMP within acceptable range on 6/4 8.  Neurogenic bowel.    Added suppository nightly and digital stimulation of rectum to improve constipation and bowel movements- if recovers ability to have BMs on their own, will stop.   6/7- no good results with bowel program- will add Miralax to senokot in AM and con't dig stim/bowel program 9. Minimal PVRs- will not  reorder PVRs- voiding OK-  10. Spasticity- explained it's normal that it gets worse over time- is very mild right now- will monitor  6/7- on valium 10 mg q6 hours prn for spasms 11. Leukocytosis  WBCs 13.8 on 6/4, labs ordered for tomorrow  6/7- WBC down to 11.7k- doing well  Afebrile 12.  Transaminitis  LFTs elevated on 6/4, labs ordered for tomorrow  6/7- actually slightly higher- 58/195- from 48/188- not on any meds that would cause it- will follow 13. Tachycardia  6/7- Will add metoprolol 12.5 mg BID for fast heart rate    LOS: 4 days A FACE TO FACE EVALUATION WAS PERFORMED  Wardell Pokorski 06/12/2019, 11:34 AM

## 2019-06-12 NOTE — Progress Notes (Signed)
Dig stim, supp given at 2003, waited 103minutes and dig stim again. Nothing happened with dig stim Saturday or Sunday night. Usually has BM 3-4 hours after supp. Discussed about moving supp. To 1600, getting up to [facility] 2-3 hours after supp. Patient reports no accidents during day, only at night. Reporting "severe pain" at rectum when having BM. Large mushy BM at 0100. Shakes after having BM, feels anxious about incontinent BM and pain when having BM. PRN oxy IR 10mg  given at 0105 and PRN valium given at 0145. Has been awake since 0200. No c/o headache. Afebrile this shift. Jacob Lambert

## 2019-06-12 NOTE — Progress Notes (Signed)
Inpatient [facility] Individual Statement of Services  Patient Name:  Jacob Lambert  Date:  7/0[birth year]  Welcome to the [facility].  Our goal is to provide you with an individualized program based on your diagnosis and situation, designed to meet your specific needs.  With this comprehensive rehabilitation program, you will be expected to participate in at least 3 hours of rehabilitation therapies Monday-Friday, with modified therapy programming on the weekends.  Your rehabilitation program will include the following services:  Physical Therapy (PT), Occupational Therapy (OT), 24 hour per day rehabilitation nursing, Therapeutic Recreaction (TR), Psychology, Neuropsychology, Care Coordinator, Rehabilitation Medicine, Nutrition Services, Pharmacy Services and Other  Weekly team conferences will be held on Tuesdays to discuss your progress.  Your Inpatient Rehabilitation Care Coordinator will talk with you frequently to get your input and to update you on team discussions.  Team conferences with you and your family in attendance may also be held.  Expected length of stay: 10-14 days    Overall anticipated outcome: Independent with an Assistive Device  Depending on your progress and recovery, your program may change. Your Inpatient Rehabilitation Care Coordinator will coordinate services and will keep you informed of any changes. Your Inpatient Rehabilitation Care Coordinator's name and contact numbers are listed  below.  The following services may also be recommended but are not provided by the [facility] will be made to provide these services after discharge if needed.  Arrangements include referral to agencies that provide these services.  Your insurance has been verified to be:  [employer]  Your primary  doctor is:  No PCP  Pertinent information will be shared with your doctor and your insurance company.  Inpatient Rehabilitation Care Coordinator:  Cathleen Corti [phone] or [phone]  Information discussed with and copy given to patient by: Rana Snare, 06/12/2019, 9:18 AM

## 2019-06-12 NOTE — Progress Notes (Addendum)
Physical Therapy Session Note  Patient Details  Name: Jacob Lambert MRN: [id number] Date of Birth: 1985/05/21  Today's Date: 06/12/2019 PT Individual Time: 8502-7741; 2878-6767 PT Individual Time Calculation (min): 85 min and 45 min  Short Term Goals: Week 1:  PT Short Term Goal 1 (Week 1): Pt will complete transfers with Supervision and LRAD PT Short Term Goal 2 (Week 1): Pt will ambulate x 150 ft with RW at Supervision level PT Short Term Goal 3 (Week 1): Pt will initiate stair training as safe and able  Skilled Therapeutic Interventions/Progress Updates:   Session 1: Pt received semi-reclined in bed. Agreeable to therapy. Pt reports a HA and neck pain, but notes they haven't received their pain medication today- RN is aware and brings medication during session. Pt is independent with clothing management. Thigh high TED hose applied to BLE by therapist. Patient is independent with bed mobility. Shoes applied by therapist d/t time conservation. Seated at EOB, pt's BP is 111/74 with a HR of 129. Sit to stand from EOB to RW with CGA. SPT from RW to W/C with CGA. While pt is independent with brake management, they note that brake management is facilitated by brake extensions d/t intermittent cervical pain with forward flexion of the trunk. Pt independently propelled themself 150 feet to and from the therapy gym via W/C. Sit to stand from W/C to RW with CGA. SPT from RW to mat table with CGA. Sit to supine performed with mod A d/t LE management and weakness. Pt denies any cervical pain while supine. Pt performed bridges 2 x 10. Tactile cues required to prevent bowing/ER of the RLE.  Pt then performed SLR 2 x 10 on the R, but was limited in performing SLR on the L d/t L knee pain (pt points to the patellar ligament/infrapatellar area). Pt instead performed SAQ 2 x 10 on the L with a foam roller under the L knee- pt denies any L knee pain during this exercise. Pt is independent with mat mobility, including  rolling to the L and R. Pt performed clamshells 2 x 10 each side, but has increased difficulty with clamshells rolled on the L side d/t LE weakness and decreased endurance. Sit to stand from mat table to RW with CGA. SPT from RW to W/C with CGA. SPT from W/C to EOB with CGA. Seated at EOB, pt's BP is 111/82 with a HR of 143. Sit to supine with mod A d/t LE weakness and management. Pt left semi-reclined in bed. HR decreases to 120 when pt is back in bed. Pt given coffee (cleared by RN d/t patient's tachycardia) and ice cream. Call bell in reach. All needs met.  Session 2: Pt received semi-reclined in bed. Agreeable to therapy. Denies any pain, but notes they feel fatigued from not sleeping well the night before. Pt is wearing thigh high TED hose to BLE. Pt is independent with bed mobility. Shoes applied by therapist for time conservation. Seated at EOB, pt's BP is 96/64 with a HR of 109. Sit to stand from EOB to RW with CGA. SPT from RW to W/C with CGA. Pt independently propelled themself 150 feet to and from the therapy gym via W/C. Sit to stand from W/C to stairs with CGA. Pt performed stair training on 6 inch steps with 4 steps up followed by 4 steps down x2 with min A, with a rest break in between sets d/t decreased endurance. Verbal cueing required throughout for proper foot placement and to prevent  scissoring of LEs. Pt utilized feedback on the second set to widen BOS to increase stability. Dependent W/C transfer to hallway d/t energy and time conservation. Pt ambulated 150 feet x 2 with a RW with min A and close W/C follow. Scissoring and occasional buckling of L knee observed during gait. Pt noted decreased proprioception of L foot/ankle while ambulating. Therapist then placed a 3 pound cuff weight around patient's L shin on the second trial of ambulation. Pt reports this immediately increased their L foot/ankle proprioception, however this also made it more difficult to advance the limb. Dependent W/C  transfer to patient's room d/t energy and time conservation. SPT from W/C to EOB with CGA. Seated at EOB, pt's BP is 96/65 with a HR of 111. Sit to supine with mod A d/t LE management and decreased endurance. Pt left semi-reclined in bed with all needs in reach. All questions answered.  Therapy Documentation Precautions:  Precautions Precautions: Fall Precaution Booklet Issued: No Precaution Comments: watch BP and HR Restrictions Weight Bearing Restrictions: No   Therapy/Group: Individual Therapy  Nadeen Landau, SPT  06/12/2019, 4:23 PM

## 2019-06-12 NOTE — Progress Notes (Addendum)
Chaplain engaged in initial visit with Jacob Lambert.  Chaplain and Jacob Lambert went over Advanced Directive and were able to engage in a meaningful conversation about death.  Jacob Lambert is very clear about what they desire regarding treatment.  Jacob Lambert was able to verbalize specific requests depending on quality of life and percentages of survival.  Jacob Lambert noted that having this conversation was hard and specifically difficult with family.  Chaplain affirmed Jacob Lambert's ability to think deeply about what they want and to have the time to discuss that with those they love.  Quality of life is really important to Jacob Lambert.  Chaplain also recognized that Jacob Lambert has had to readjust in several areas of their life concerning walking, their job, traveling and more.  Jacob Lambert is dealing with what it means for their life to change.  Chaplain suggested therapy/counseling to assist Jacob Lambert in those changes, in which they noted that they have found a therapist to meet their needs.    Chaplain offered the ministries of presence, prayer and listening.

## 2019-06-12 NOTE — Progress Notes (Signed)
Occupational Therapy Session Note  Patient Details  Name: Jacob Lambert MRN: [id number] Date of Birth: 03-31-1985  Today's Date: 06/12/2019 OT Individual Time: 1045-1200 OT Individual Time Calculation (min): 75 min    Short Term Goals: Week 1:  OT Short Term Goal 1 (Week 1): Pt will transfer wiht S overall consistently with LRAD to toilet OT Short Term Goal 2 (Week 1): Pt will step over shower edge wiht CGA using LRAD OT Short Term Goal 3 (Week 1): Pt will complete LB dressing with S OT Short Term Goal 4 (Week 1): Pt will complete UB dressing wiht set up  Skilled Therapeutic Interventions/Progress Updates:    OT intervention with focus on bed mobility, stand pivot transfers, BADL retraining, standing balance, BUE functional use, discharge planning, and safety awareness to increase independence with BADLs. See Care Tool for assist levels.  Pt used plastic bag to don Ted hose without assistance.  Pt stood at sink to brush teeth using LUE (dominant but with decreased sensation). Pt used edge of sink to maintain balance while standing to brush teeth. Pt completed bathing at shower level and dressing with sit<>stand from w/c at sink.  All transfers with CGA. Pt returned to bed with bed alarm activated and all needs within reach. RN present.  Therapy Documentation Precautions:  Precautions Precautions: Fall Precaution Booklet Issued: No Precaution Comments: watch BP and HR Restrictions Weight Bearing Restrictions: No    Pain: Pain Assessment Pain Scale: 0-10 Pain Score: 5  Pain Type: Acute pain Pain Location: Back Pain Orientation: Right;Left Pain Descriptors / Indicators: Aching Pain Frequency: Intermittent Patients Stated Pain Goal: 3 Pain Intervention(s): emotional support, repositioned   Therapy/Group: Individual Therapy  [name] 06/12/2019, 12:33 PM

## 2019-06-12 NOTE — Progress Notes (Signed)
RN performed dig stim and then suppository as soon as dinner was finished. RN did dig stim x2 with no results. RN could not feel stool in the rectal vault. At this time patient requested to be transferred to chair for a zoom call that was pre scheduled. Will report this to RN on night shift.

## 2019-06-12 NOTE — Progress Notes (Signed)
Patient ID: Jacob Lambert, adult   DOB: [birth year], 34 y.o.   MRN: [id number]   SW left message for [name] [phone]) with courtesy phone call and to provide updates. SW requested return call and informed her husband would be contacted.   SW spoke with [name] [phone]) to introduce self, explain role, and discuss discharge process. SW informed on ELOS 10-14 days. SW to continue to provide updates. Best time to call this week is before 9:30am and after 4pm as working in the ED; off next week.   Loralee Pacas, MSW, [place] Office: [phone] Cell: [phone] Fax: [phone]

## 2019-06-13 ENCOUNTER — Inpatient Hospital Stay (HOSPITAL_COMMUNITY): Payer: BC Managed Care – PPO

## 2019-06-13 ENCOUNTER — Encounter (HOSPITAL_COMMUNITY): Payer: BC Managed Care – PPO | Admitting: Psychology

## 2019-06-13 ENCOUNTER — Inpatient Hospital Stay (HOSPITAL_COMMUNITY): Payer: BC Managed Care – PPO | Admitting: Physical Therapy

## 2019-06-13 MED ORDER — LIDOCAINE 5 % EX PTCH
1.0000 | MEDICATED_PATCH | CUTANEOUS | Status: DC
  Administered 2019-06-14 – 2019-06-27 (×14): 1 via TRANSDERMAL
  Filled 2019-06-13 (×18): qty 1

## 2019-06-13 MED ORDER — TOPIRAMATE 25 MG PO TABS
25.0000 mg | ORAL_TABLET | Freq: Every day | ORAL | Status: DC
  Administered 2019-06-14 – 2019-06-27 (×14): 25 mg via ORAL
  Filled 2019-06-13 (×14): qty 1

## 2019-06-13 MED ORDER — DOCUSATE SODIUM 100 MG PO CAPS
100.0000 mg | ORAL_CAPSULE | Freq: Every day | ORAL | Status: DC
  Administered 2019-06-13 – 2019-06-22 (×10): 100 mg via ORAL
  Filled 2019-06-13 (×10): qty 1

## 2019-06-13 MED ORDER — SIMETHICONE 80 MG PO CHEW
80.0000 mg | CHEWABLE_TABLET | Freq: Four times a day (QID) | ORAL | Status: DC | PRN
  Administered 2019-06-13: 80 mg via ORAL
  Filled 2019-06-13: qty 1

## 2019-06-13 NOTE — Progress Notes (Signed)
Sitting in wheelchair, talking with friend on computer. After call assisted patient to BR to Void. At 2045, assisted They back to BR, large continent BM. Patient encouraged by continent BM. Declined dig stim. No Incontinent episodes of B & B during night. Wants to continue with new bowel program regimen. Stool  Type 4-5, They reports having excruciating pain when passing ANY formed stool.  PRN valium and tylenol given at 2104, slept until 0545. Reluctant to take Oxy IR, because side effect of constipation.Patrici Ranks A

## 2019-06-13 NOTE — Plan of Care (Signed)
  Problem: Consults Goal: RH SPINAL CORD INJURY PATIENT EDUCATION Description:  See Patient Education module for education specifics.  Outcome: Progressing   Problem: SCI BOWEL ELIMINATION Goal: RH STG MANAGE BOWEL WITH ASSISTANCE Description: STG Manage Bowel with min Assistance. Outcome: Progressing Goal: RH STG MANAGE BOWEL W/EQUIPMENT W/ASSISTANCE Description: STG Manage Bowel With Equipment With min Assistance Outcome: Progressing Goal: RH STG SCI MANAGE BOWEL PROGRAM W/ASSIST OR AS APPROPRIATE Description: STG SCI Manage bowel program w/min assist or as appropriate. Outcome: Progressing   Problem: SCI BLADDER ELIMINATION Goal: RH STG MANAGE BLADDER WITH EQUIPMENT WITH ASSISTANCE Description: STG Manage Bladder With Equipment With mod I Assistance Outcome: Progressing   Problem: RH SKIN INTEGRITY Goal: RH STG SKIN FREE OF INFECTION/BREAKDOWN Outcome: Progressing Goal: RH STG MAINTAIN SKIN INTEGRITY WITH ASSISTANCE Description: STG Maintain Skin Integrity With min Assistance. Outcome: Progressing   Problem: RH SAFETY Goal: RH STG ADHERE TO SAFETY PRECAUTIONS W/ASSISTANCE/DEVICE Description: STG Adhere to Safety Precautions With min Assistance/Device. Outcome: Progressing   Problem: RH PAIN MANAGEMENT Goal: RH STG PAIN MANAGED AT OR BELOW PT'S PAIN GOAL Description: STG pt's pain level less than 4 on scale of 1-10 Outcome: Progressing   Problem: RH KNOWLEDGE DEFICIT SCI Goal: RH STG INCREASE KNOWLEDGE OF SELF CARE AFTER SCI Outcome: Progressing

## 2019-06-13 NOTE — Progress Notes (Signed)
Pt is in bowel program and was given a suppository after dinner and dig stim occurred multiple times. Pt did not empty out until 9:00pm. He had a very large bowel movement, and form was mushy then liquid, brown in color. Pt feels relief.

## 2019-06-13 NOTE — Patient Care Conference (Signed)
Inpatient RehabilitationTeam Conference and Plan of Care Update Date: 06/13/2019   Time: 3:37 PM    Patient Name: Jacob Lambert      Medical Record Number: [id number]  Date of Birth: 21-Mar-1985 Sex: Adult         Room/Bed: 4W11C/4W11C-01 Payor Info: Payor: BLUE CROSS BLUE [employer]IELD / Plan: BCBS COMM PPO / Product Type: *No Product type* /    Admit Date/Time:  06/08/2019  4:37 PM  Primary Diagnosis:  Quadriplegia and quadriparesis ([place])  Patient Active Problem List   Diagnosis Date Noted  . Transaminitis   . Leukocytosis   . Neurogenic bowel   . Postoperative pain   . 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 Quadriplegia and quadriparesis ([place])   . 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 Numbness 05/10/2019  . Weakness of left lower extremity 05/10/2019  . 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 Visual disturbance 05/10/2019    Expected Discharge Date: Expected Discharge Date: 06/28/19  Team Members Present: Physician leading conference: Dr. Courtney Heys Care Coodinator Present: Loralee Pacas, LCSWA;Other (comment)(Orlanda Frankum Creig Hines, RN, BSN, CRRN) Nurse Present: Rayetta Pigg, RN PT Present: Excell Seltzer, PT OT Present: Roanna Epley, COTA PPS Coordinator present : Gunnar Fusi, SLP     Current Status/Progress Goal Weekly Team Focus  Bowel/Bladder   Incontinent of bowel 2 out of last 3 nights. Continent last night with new bowel program. Occasional episode of urinary incontinece.  Continent of B &B  Getting patient up to bathroom 3-4 hours after supp.   Swallow/Nutrition/ Hydration             ADL's   functional tranfsers-CGA; bathing-CGA; LB dressing-CGA, standing balance-CGA  mod I overall  education, transfers, standing balance, ADL training   Mobility   Independent with bed mobility, CGA for transfers, min A for gait with RW, min A for stairs with 2 handrails  Mod I overall with supervision for stairs  Gait, stairs, LE coordination and NMR   Communication             Safety/Cognition/  Behavioral Observations  Tearful & emotional at times talking about current situation.  Neuropsych. to eval this AM     provide emotional support   Pain   complains of severe pain when having bowel movements  Patient reluctant to take oxy because of causing constipation.  < 3  offer pain meds before therapy and PM bowel program   Skin   Incision OTA, skin glue.  remain free from infection  assess skin every shift and PRN    Rehab Goals Patient on target to meet rehab goals: Yes *See Care Plan and progress notes for long and short-term goals.     Barriers to Discharge  Current Status/Progress Possible Resolutions Date Resolved   Nursing  Incontinence               PT  Medical stability;Incontinence;Pending chemo/radiation                 OT                  SLP                Care Coordinator Decreased caregiver support;Lack of/limited family support              Discharge Planning/Teaching Needs:  D/c to home with parents with 24/7 care. Both parents continue to work; work schedules create a balance for pt to have support needed.  Family education as recommended   Team  Discussion:  Using Tylenol vs Oxy IR d/t causing constipation. Nursing to continue to bowel program education. PT is working towards [employer] I goals. Per OT pt likes to hold onto people when transferring, trying to discourage that and focus on a squat pivot transfer. Next week to begin working on simple meal prepping.  Revisions to Treatment Plan:  MD added Colace, Lidoderm pain patch to AM, and Topamax to HS for migraine headaches.    Medical Summary Current Status: Using a lot of tylenol- likely cause of incr LFTs- type 6 stools? with bowel program- took Oxy this AM- skin looks good/incision Weekly Focus/Goal: PT- mod I with RW- working on stairs/gait and coordination- min A  Barriers to Discharge: Home enviroment access/layout;Incontinence;Neurogenic Bowel & Bladder;Pending chemo/radiation;Wound care;Weight  bearing restrictions;Medical stability  Barriers to Discharge Comments: tachcyardia is improving, but need to work on titration- d/c 6/23 Possible Resolutions to Barriers: OT- doing very well- likes to hold on to people for transfers- next week simple meal prep- goals mod I   Continued Need for Acute Rehabilitation Level of Care: The patient requires daily medical management by a physician with specialized training in physical medicine and rehabilitation for the following reasons: Direction of a multidisciplinary physical rehabilitation program to maximize functional independence : Yes Medical management of patient stability for increased activity during participation in an intensive rehabilitation regime.: Yes Analysis of laboratory values and/or radiology reports with any subsequent need for medication adjustment and/or medical intervention. : Yes   I attest that I was present, lead the team conference, and concur with the assessment and plan of the team.   Cristi Loron 06/13/2019, 3:37 PM

## 2019-06-13 NOTE — Progress Notes (Signed)
Physical Therapy Session Note  Patient Details  Name: Jacob Lambert MRN: [id number] Date of Birth: 08-01-1985  Today's Date: 06/14/2019 PT Individual Time: 0800-0915; 1300-1400; 1500-1530 PT Individual Time Calculation (min): 75 min; 60 min; 30 min Short Term Goals: Week 1:  PT Short Term Goal 1 (Week 1): Pt will complete transfers with Supervision and LRAD PT Short Term Goal 2 (Week 1): Pt will ambulate x 150 ft with RW at Supervision level PT Short Term Goal 3 (Week 1): Pt will initiate stair training as safe and able  Skilled Therapeutic Interventions/Progress Updates:   Session 1 Pt received semi-reclined in bed finishing breakfast. Agreeable to therapy. Denies any pain, but notes they would prefer if their Lidoderm patch were changed in the AM instead of the PM. B/L thigh high TED hose applied by therapist to BLE. Pt is independent with bed mobility. Pt is independent with clothing management, including donning shoes. Seated at EOB, pt's BP is 100/71 with a HR of 99. After measuring patient's BP, Dr. Dagoberto Ligas entered the room to check in on pt. She updated patient on ELOS as well as bowel management program. Patient's father, Dr. Delora Fuel, then entered the room. He discussed patient's status with Dr. Dagoberto Ligas, and also stated he is hoping to install B/L handrails on their garage steps. On resumption of therapy, sit to stand from EOB or W/C to RW or parallel bars performed at a CGA level throughout session. SPT to and from EOB to W/C performed at a CGA level with RW throughout session. Pt is independent with W/C propulsion 150 feet to and from therapy gym. Holding onto both parallel bars, pt ambulated a total of 10 steps forward followed by 10 steps backward x2 with a rest break in between sets. Pt required min A during the first set and required mod A by the end of the second set d/t decreased endurance and LE weakness. Verbal cues required initially to promote a more stable step-to gait pattern  before advancing to a step-through gait pattern. Pt did c/o "vibrating" L knee pain throughout both sets, though they note this improved slightly during the second set. In addition, pt's L knee buckling increased in frequency as patient fatigued. Pt rested in between sets on the W/C. Stand to sit from parallel bars or RW to W/C or EOB performed at a min A level throughout session d/t lack of eccentric control of the quads. Next, pt performed lateral stepping with both hands holding onto one parallel bar. They performed a total of 10 steps to the L and 10 steps to the R x2 with min A and a rest break in between sets. Intoeing of B/L feet noted throughout secondary to poor control of placement of LEs while sidestepping. On completion of the first set, pt noted they felt lightheaded. Seated in W/C, pt's BP was 97/74 with a HR of 105- pt stated that lightheadedness quickly resolved with sitting. Pt also c/o increased paresthesias in the ulnar nerve distribution of the L hand which they attribute to squeezing the parallel bars too tightly. Before returning to their room, pt given a pair of small W/C gloves. Pt left semi-reclined in bed with all needs in reach. All questions answered.  Session 2 Pt received supine. Agreeable to therapy. Denies any pain. Pt is independent with bed mobility. Pt is independent with donning shoes. Seated at EOB, pt's BP is 116/75 with a HR of 105. Pt then requested to use the bathroom. Sit to stand from  EOB to RW with CGA. Pt ambulated 10 feet to and from the bathroom with a RW with min A. Clothing management performed independently by pt. Pericare performed independently by pt. Stand to sit from RW to W/C with min A. Pt independently propelled themself 150 feet to the therapy gym via W/C. Demonstrated for pt and then performed squat pivot transfers from W/C <> mat table x4 (two from the right side of the W/C, two from the left side of the W/C) with min A to promote pt independence. Verbal  cues required for hand placement and forward lean of the trunk. Pt independently managed the W/C rail on the R but had difficulty managing the W/C rail on the L d/t LUE weakness and paresthesias. Seated in the W/C, a 3 inch ACE wrap was applied to patient's L knee to increase stability at the knee joint. Dependent W/C transfer to hallway for time and energy conservation. Sit to stand from W/C to RW with CGA. Pt ambulated 50 feet with a RW with min A and close W/C follow. The pt reported resolution of L knee pain while wearing the ACE wrap, however they noted an increase in "vibration" around the R knee, possibly d/t a gait compensation from the ACE wrap. At the end of the 50 feet, pt began to experience lightheadedness and diaphoresis. Performed stand to sit from RW to W/C with min A. A cool washcloth was placed on patient's forehead. Seated in W/C, pt's BP was 94/71 with a HR of 122. They reported all symptoms resolved with sitting. Sit to stand from W/C to RW with CGA. Pt ambulated 150 feet with a RW with min A and close W/C follow. Denied any lightheadedness or dizziness. Though pt still demonstrates LE adduction while ambulating, scissoring is reduced from previous therapy sessions. Stand to sit from RW to W/C with min A. Dependent W/C transfer to patient room secondary to time management. Pt left seated in W/C with call bell and all needs in reach. ACE wrap to L knee removed immediately following this session by OT Roanna Epley who was on his way to see pt.  Session 3 Pt received seated in W/C. Agreeable to therapy. Denies any pain. Pt is independent with donning shoes and W/C gloves. Pt independently propelled themself 150 feet to and from the therapy gym. Pt propelled themself up and down a 20 foot W/C ramp x2 with min A and a rest break in between sets d/t decreased endurance. Verbal cues required to reinforce forward trunk lean while ascending W/C ramp. Occasional tactile cues needed to keep the W/C  aligned straight on the ramp. Pt felt fatigued after this, but denied any lightheadedness or dizziness. Pt then propelled themself backwards up and then forwards down a W/C ramp with the use of UEs and LEs for global strength training with min A. Next, pt propelled themself forwards up and then forwards down a W/C ramp with the use of UEs and LEs for global strength training with min A. Pt exhibits decreased LE strength and coordination while propelling themself down W/C ramp as LEs almost get caught under W/C. Back in their room, pt performed a squat pivot transfer from W/C to EOB with min A. Pt is independent with bed mobility. Pt left supine in bed. All needs in reach. All questions answered.  Therapy Documentation Precautions:  Precautions Precautions: Fall Precaution Booklet Issued: No Precaution Comments: watch BP and HR Restrictions Weight Bearing Restrictions: No   Therapy/Group: Individual Therapy  Nadeen Landau, SPT 06/14/2019, 7:36 AM

## 2019-06-13 NOTE — Progress Notes (Addendum)
Occupational Therapy Session Note  Patient Details  Name: Jacob Lambert MRN: [id number] Date of Birth: Nov 21, 1985  Today's Date: 06/13/2019 OT Individual Time: 1400-1455 OT Individual Time Calculation (min): 55 min    Short Term Goals: Week 1:  OT Short Term Goal 1 (Week 1): Pt will transfer wiht S overall consistently with LRAD to toilet OT Short Term Goal 2 (Week 1): Pt will step over shower edge wiht CGA using LRAD OT Short Term Goal 3 (Week 1): Pt will complete LB dressing with S OT Short Term Goal 4 (Week 1): Pt will complete UB dressing wiht set up  Skilled Therapeutic Interventions/Progress Updates:    Pt resting in w/c upon arrival.  OT intervention with focus on LUE function with use of theraputty and beads. Pt also educated on [facility] exercises/tasks to improve [facility]. Pt educated on theraputty activities/tasks.  Pt concerned about decreased AROM of digits 3-5 on LUE.  Pt became emotional during session when reflecting on their life altering event. Emotional support provided.  Therapeutic use of self employed to assist pt in rethinking their life and adjusting to a new normal. Pt remained in w/c and handed off to PT.  Therapy Documentation Precautions:  Precautions Precautions: Fall Precaution Booklet Issued: No Precaution Comments: watch BP and HR Restrictions Weight Bearing Restrictions: No   Pain: Pain Assessment Pain Scale: 0-10 Pain Score: 2  Pain Type: Acute pain Pain Location: Neck Pain Orientation: Left Pain Descriptors / Indicators: Aching Pain Onset: Gradual Patients Stated Pain Goal: 2 Pain Intervention(s): emotional support, repositoined  Therapy/Group: Individual Therapy  [name] 06/13/2019, 3:07 PM

## 2019-06-13 NOTE — Progress Notes (Signed)
Patient ID: Jacob Lambert, adult   DOB: [birth year], 34 y.o.   MRN: [id number]  SW met with pt in room to provide updates from team conference, and anticipated d/c date 06/28/2019. Pt aware SW to follow-up with father to provide updates.   SW spoke with pt father [name] [phone]) on above. No questions/concerns reported at this time. Reports they are working on a video of the home to provide to therapy.  SW will continue to follow and assess pt for discharge needs.   Loralee Pacas, MSW, [place] Office: [phone] Cell: [phone] Fax: [phone]

## 2019-06-13 NOTE — Progress Notes (Signed)
[facility] PHYSICAL MEDICINE & REHABILITATION PROGRESS NOTE   Subjective/Complaints:  Pt's pulse is doing better- ~ 100 now with Metoprolol.   Pt reports BM yesterday a few hours after bowel program done- did suppository per pt before dinner- ~ 4:30 pm- and then dig stim- (note says after dinner???).   Pt did have partial BM on toilet since started going and moved to toilet.   Slept better since bowels done earlier and hard stools- so will add Colace.  Also change lidoderm to 8am-8pm.     ROS:   Pt denies SOB, abd pain, CP, N/V/C/D, and vision changes   Objective:   No results found. Recent Labs    06/12/19 0858  WBC 11.7*  HGB 13.5  HCT 42.2  PLT 382   Recent Labs    06/12/19 0800  NA 136  K 4.1  CL 102  CO2 25  GLUCOSE 106*  BUN 11  CREATININE 1.04  CALCIUM 8.9    Intake/Output Summary (Last 24 hours) at 06/13/2019 0912 Last data filed at 06/13/2019 0849 Gross per 24 hour  Intake 1595 ml  Output 1200 ml  Net 395 ml     Physical Exam: Vital Signs Blood pressure 106/65, pulse 87, temperature 98 F (36.7 C), temperature source Oral, resp. rate 18, height 5' 10.5" (1.791 m), weight 70.6 kg, SpO2 100 %. Constitutional: No distress . Vital signs reviewed. Pt sitting EOB dressed with PT and student in room, appropriate, NAD HENT: Normocephalic.  Atraumatic. Eyes: conjugate gaze Cardiovascular: borderline tachycardia- regular rhythm Respiratory: CTA B/L- no W/R/R- good air movement GI: Soft, NT, ND, (+)BS  Skin: Warm and dry.  Intact. Psych: appropriate Musc: No edema in extremities.  No tenderness in extremities. Neuro: Alert Facial muscles intact Motor: Right upper extremity: Grossly 4+-5/5 proximal distal Left upper extremity: 4 -/5 proximal distal Right lower extremity: Hip flexion, knee extension, ankle dorsiflexion 4 -/5 proximal distal Left lower extremity: Hip flexion, knee extension 3 -/5, ankle dorsiflexion 3+/5  Assessment/Plan: 1.  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  Physiatrist is providing close team supervision and 24 hour management of active medical problems listed below.  Physiatrist and rehab team continue to assess barriers to discharge/monitor patient progress toward functional and medical goals  Care Tool:  Bathing    Body parts bathed by patient: Right arm, Left arm, Chest, Abdomen, Front perineal area, Right upper leg, Left upper leg, Right lower leg, Left lower leg, Face   Body parts bathed by helper: Buttocks     Bathing assist Assist Level: Minimal Assistance - Patient > 75%     Upper Body Dressing/Undressing Upper body dressing   What is the patient wearing?: Pull over shirt    Upper body assist Assist Level: Set up assist    Lower Body Dressing/Undressing Lower body dressing      What is the patient wearing?: Skirt, Underwear/pull up     Lower body assist Assist for lower body dressing: Contact Guard/Touching assist     Toileting Toileting    Toileting assist Assist for toileting: Dependent - Patient 0% Assistive Device Comment: urinal   Transfers Chair/bed transfer  Transfers assist     Chair/bed transfer assist level: Contact Guard/Touching assist     Locomotion Ambulation   Ambulation assist      Assist level: Minimal Assistance - Patient > 75% Assistive device:  Walker-rolling Max distance: 150 feet   Walk 10 feet activity   Assist     Assist level: Minimal Assistance - Patient > 75% Assistive device: Walker-rolling   Walk 50 feet activity   Assist    Assist level: Minimal Assistance - Patient > 75% Assistive device: Walker-rolling    Walk 150 feet activity   Assist    Assist level: Minimal Assistance - Patient > 75% Assistive device: Walker-rolling    Walk 10 feet on uneven surface   activity   Assist Walk 10 feet on uneven surfaces activity did not occur: Safety/medical concerns         Wheelchair     Assist Will patient use wheelchair at discharge?: Yes Type of Wheelchair: Manual    Wheelchair assist level: Independent Max wheelchair distance: 150'    Wheelchair 50 feet with 2 turns activity    Assist        Assist Level: Independent   Wheelchair 150 feet activity     Assist      Assist Level: Independent   Blood pressure 106/65, pulse 87, temperature 98 F (36.7 C), temperature source Oral, resp. rate 18, height 5' 10.5" (1.791 m), weight 70.6 kg, SpO2 100 %.  Medical Problem List and Plan: 1.  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  Status post C6-7 laminoplasty resection of spinal cord tumor 06/01/2019.  Decadron taper completed  Continue CIR 2.  Antithrombotics: -DVT/anticoagulation: Lovenox.  Will need for 2-3 months depending on walking ability  Vascular study negative for DVT             -antiplatelet therapy: N/A 3. Pain Management: Neurontin 600 mg 3 times daily, Lidoderm patch as directed, Valium 10 mg every 6 hours as needed muscle spasms, Robaxin 750 mg every 6 hours as needed muscle spasms, oxycodone as needed  Topamax 25 daily started on 6/6 for migraines per patient request  6/8- change to QHS so doesn't sedate pt. Change lidoderm to 8am to 8pm- changed.   4. Mood: - don't think pt needs meds- will add if needed             -antipsychotic agents: N/A 5. Neuropsych: This patient is capable of making decisions on their own behalf. 6. Skin/Wound Care: Routine skin checks 7. Fluids/Electrolytes/Nutrition: Routine in and outs  BMP within acceptable range on 6/4  6/8- looks good 6/7- no issues 8.  Neurogenic bowel.    Added suppository nightly and digital stimulation of rectum to improve constipation and bowel movements- if recovers  ability to have BMs on their own, will stop.   6/7- no good results with bowel program- will add Miralax to senokot in AM and con't dig stim/bowel program  6/8- make sure bowel program AFTER dinner for ileocolic reflex- also add Colace 100 mg daily for hard stools.  9. Minimal PVRs- will not reorder PVRs- voiding OK-  10. Spasticity- explained it's normal that it gets worse over time- is very mild right now- will monitor  6/7- on valium 10 mg q6 hours prn for spasms 11. Leukocytosis  WBCs 13.8 on 6/4, labs ordered for tomorrow  6/7- WBC down to 11.7k- doing well  Afebrile 12.  Transaminitis  LFTs elevated on 6/4, labs ordered for tomorrow  6/7- actually slightly higher- 58/195- from 48/188- not on any meds that would cause it- will follow 13. Tachycardia  6/7- Will add metoprolol 12.5 mg BID for  fast heart rate  6/8- heart rate now ~ 100- doing well.   LOS: 5 days A FACE TO FACE EVALUATION WAS PERFORMED  Jayden Kratochvil 06/13/2019, 9:12 AM

## 2019-06-13 NOTE — Progress Notes (Signed)
Occupational Therapy Session Note  Patient Details  Name: Jacob Lambert MRN: [id number] Date of Birth: 1985/10/11  Today's Date: 06/13/2019 OT Individual Time: 1000-1100 OT Individual Time Calculation (min): 60 min    Short Term Goals: Week 1:  OT Short Term Goal 1 (Week 1): Pt will transfer wiht S overall consistently with LRAD to toilet OT Short Term Goal 2 (Week 1): Pt will step over shower edge wiht CGA using LRAD OT Short Term Goal 3 (Week 1): Pt will complete LB dressing with S OT Short Term Goal 4 (Week 1): Pt will complete UB dressing wiht set up  Skilled Therapeutic Interventions/Progress Updates:    Pt resting in bed upon arrival and agreeable to therapy.  Pt transferred to w/c with CGA and propelled w/c to gym and engaged in BUE therex on [employer] (10 mins level 4 with break to check HR). HR did not exceed 122 during session. Pt also engaged in Dynavision activity with focus on scanning and compensation of R eye visual deficit. Pt propelled w/c to ADL kitchen with focus on w/c mobility and kitchen safety at w/c level.  Pt propelled w/c back to room and transferred to bed. W/c mobility with supervision.  Pt with improved scanning when propelling w/c. Pt remained in bed with bed alarm activated.   Therapy Documentation Precautions:  Precautions Precautions: Fall Precaution Booklet Issued: No Precaution Comments: watch BP and HR Restrictions Weight Bearing Restrictions: No   Pain: Pain Assessment Pain Score: 2 (working with therapy)   Therapy/Group: Individual Therapy  [name] 06/13/2019, 12:03 PM

## 2019-06-14 ENCOUNTER — Inpatient Hospital Stay (HOSPITAL_COMMUNITY): Payer: BC Managed Care – PPO | Admitting: Physical Therapy

## 2019-06-14 ENCOUNTER — Inpatient Hospital Stay (HOSPITAL_COMMUNITY): Payer: BC Managed Care – PPO

## 2019-06-14 MED ORDER — CITALOPRAM HYDROBROMIDE 20 MG PO TABS
20.0000 mg | ORAL_TABLET | Freq: Every day | ORAL | Status: DC
  Administered 2019-06-14 – 2019-06-28 (×15): 20 mg via ORAL
  Filled 2019-06-14 (×16): qty 1

## 2019-06-14 MED ORDER — BISACODYL 10 MG RE SUPP
10.0000 mg | Freq: Every day | RECTAL | Status: DC
  Administered 2019-06-14 – 2019-06-27 (×14): 10 mg via RECTAL
  Filled 2019-06-14 (×16): qty 1

## 2019-06-14 NOTE — Progress Notes (Signed)
[facility] PHYSICAL MEDICINE & REHABILITATION PROGRESS NOTE   Subjective/Complaints:   Pt's BP is doing well per nursing.  Had another Bm last evening around 8:45 pm- bowel program done around 6pm, so BM late/after bowel program.   Came suddenly and moved self to toilet to go-knows not supposed to do that.   Feels like more needed to come out and did in [place].  Neuropsych a doozy- feels needs a Ant-depressant.      ROS:   Pt denies SOB, abd pain, CP, N/V/C/D, and vision changes   Objective:   No results found. Recent Labs    06/12/19 0858  WBC 11.7*  HGB 13.5  HCT 42.2  PLT 382   Recent Labs    06/12/19 0800  NA 136  K 4.1  CL 102  CO2 25  GLUCOSE 106*  BUN 11  CREATININE 1.04  CALCIUM 8.9    Intake/Output Summary (Last 24 hours) at 06/14/2019 0824 Last data filed at 06/14/2019 0707 Gross per 24 hour  Intake 1200 ml  Output 1800 ml  Net -600 ml     Physical Exam: Vital Signs Blood pressure 110/79, pulse 97, temperature 99 F (37.2 C), resp. rate 18, height 5' 10.5" (1.791 m), weight 70.6 kg, SpO2 100 %. Constitutional: No distress . Sitting up in bed, taking meds from RN's in room; appropriate, almost tearful at times; dealing with overwhelming issues; NAD HENT: Normocephalic.  Atraumatic. Eyes: conjugate gaze Cardiovascular: no JVD Respiratory: no resp distress; no accessory muscle use GI: nondistended Skin: Warm and dry.  Intact. Psych: appropriate Musc: No edema in extremities.  No tenderness in extremities. Neuro: Alert Facial muscles intact Motor: Right upper extremity: Grossly 4+-5/5 proximal distal Left upper extremity: 4 -/5 proximal distal Right lower extremity: Hip flexion, knee extension, ankle dorsiflexion 4 -/5 proximal distal Left lower extremity: Hip flexion, knee extension 3 -/5, ankle dorsiflexion 3+/5  Assessment/Plan: 1. 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  Physiatrist is providing close team supervision and 24 hour management of active medical problems listed below.  Physiatrist and rehab team continue to assess barriers to discharge/monitor patient progress toward functional and medical goals  Care Tool:  Bathing    Body parts bathed by patient: Right arm, Left arm, Chest, Abdomen, Front perineal area, Right upper leg, Left upper leg, Right lower leg, Left lower leg, Face   Body parts bathed by helper: Buttocks     Bathing assist Assist Level: Minimal Assistance - Patient > 75%     Upper Body Dressing/Undressing Upper body dressing   What is the patient wearing?: Pull over shirt    Upper body assist Assist Level: Set up assist    Lower Body Dressing/Undressing Lower body dressing      What is the patient wearing?: Skirt, Underwear/pull up     Lower body assist Assist for lower body dressing: Contact Guard/Touching assist     Toileting Toileting    Toileting assist Assist for toileting: Dependent - Patient 0% Assistive Device Comment: urinal   Transfers Chair/bed transfer  Transfers assist     Chair/bed transfer assist level: Contact Guard/Touching assist     Locomotion Ambulation   Ambulation assist      Assist level: Minimal Assistance - Patient > 75% Assistive device: Walker-rolling Max distance: 150 feet   Walk 10 feet activity   Assist  Assist level: Minimal Assistance - Patient > 75% Assistive device: Walker-rolling   Walk 50 feet activity   Assist    Assist level: Minimal Assistance - Patient > 75% Assistive device: Walker-rolling    Walk 150 feet activity   Assist    Assist level: Minimal Assistance - Patient > 75% Assistive device: Walker-rolling    Walk 10 feet on uneven surface  activity   Assist Walk 10 feet on uneven surfaces activity did not occur: Safety/medical concerns          Wheelchair     Assist Will patient use wheelchair at discharge?: Yes Type of Wheelchair: Manual    Wheelchair assist level: Independent Max wheelchair distance: 150'    Wheelchair 50 feet with 2 turns activity    Assist        Assist Level: Independent   Wheelchair 150 feet activity     Assist      Assist Level: Independent   Blood pressure 110/79, pulse 97, temperature 99 F (37.2 C), resp. rate 18, height 5' 10.5" (1.791 m), weight 70.6 kg, SpO2 100 %.  Medical Problem List and Plan: 1.  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  Status post C6-7 laminoplasty resection of spinal cord tumor 06/01/2019.  Decadron taper completed  Continue CIR 2.  Antithrombotics: -DVT/anticoagulation: Lovenox.  Will need for 2-3 months depending on walking ability  Vascular study negative for DVT             -antiplatelet therapy: N/A 3. Pain Management: Neurontin 600 mg 3 times daily, Lidoderm patch as directed, Valium 10 mg every 6 hours as needed muscle spasms, Robaxin 750 mg every 6 hours as needed muscle spasms, oxycodone as needed  Topamax 25 daily started on 6/6 for migraines per patient request  6/8- change to QHS so doesn't sedate pt. Change lidoderm to 8am to 8pm- changed.   4. Mood: - don't think pt needs meds- will add if needed  6/9- will add Celexa 20 mg daily for affect/mood             -antipsychotic agents: N/A 5. Neuropsych: This patient is capable of making decisions on their own behalf. 6. Skin/Wound Care: Routine skin checks 7. Fluids/Electrolytes/Nutrition: Routine in and outs  BMP within acceptable range on 6/4  6/8- looks good 6/7- no issues 8.  Neurogenic bowel.    Added suppository nightly and digital stimulation of rectum to improve constipation and bowel movements- if recovers ability to have BMs on their own, will stop.   6/7- no good results with bowel  program- will add Miralax to senokot in AM and con't dig stim/bowel program  6/8- make sure bowel program AFTER dinner for ileocolic reflex- also add Colace 100 mg daily for hard stools.   6/9- said some hard pieces still- was documented as #6 stools- so soft- don't want to add more Colace as of yet.  9. Minimal PVRs- will not reorder PVRs- voiding OK-  10. Spasticity- explained it's normal that it gets worse over time- is very mild right now- will monitor  6/7- on valium 10 mg q6 hours prn for spasms 11. Leukocytosis  WBCs 13.8 on 6/4, labs ordered for tomorrow  6/7- WBC down to 11.7k- doing well  Afebrile 12.  Transaminitis  LFTs elevated on 6/4, labs ordered for tomorrow  6/7- actually slightly higher- 58/195- from 48/188- not on any meds that would cause it-  will follow 13. Tachycardia  6/7- Will add metoprolol 12.5 mg BID for fast heart rate  6/9- Heart rate ~ 100-    LOS: 6 days A FACE TO FACE EVALUATION WAS PERFORMED  Swathi Dauphin 06/14/2019, 8:24 AM

## 2019-06-14 NOTE — Progress Notes (Signed)
Occupational Therapy Session Note  Patient Details  Name: Jacob Lambert MRN: [id number] Date of Birth: 06-Dec-1985  Today's Date: 06/14/2019 OT Individual Time: 1115-5208 OT Individual Time Calculation (min): 75 min    Short Term Goals: Week 2:  OT Short Term Goal 1 (Week 2): Pt will transfer wiht S overall consistently with LRAD to toilet OT Short Term Goal 2 (Week 2): Pt will step over shower edge wiht CGA using LRAD OT Short Term Goal 3 (Week 2): Pt will complete LB dressing with S  Skilled Therapeutic Interventions/Progress Updates:    Pt resting in bed upon arrival.  Pt declined shower but changed clothing with sit<>stand from EOB. Squat pivot transfer to w/c with CGA. Initial focus on BLE with Nustep (level 5, 50 spm). Pt engaged in table tasks with focus on LUE [facility] including inhand manipulation tasks.  Discussed remediation strategies vs compensatory strategies. Pt agreed they  will incorporate some compensatory strategies and begin using RUE for some tasks, espeically when increased strength. TAsks included shuffling cards, dealing cards, manipulating poker chips and large beads. Emotional support and therapeutic use of self employed when pt beacme upset regarding prognosis. Pt returned to room and transferred to bed.  Pt remained in bed with all needs within beach and bed alarm activated.  Therapy Documentation Precautions:  Precautions Precautions: Fall Precaution Booklet Issued: No Precaution Comments: watch BP and HR Restrictions Weight Bearing Restrictions: No Pain: Pain Assessment Pain Scale: Faces Faces Pain Scale: Hurts little more Pain Type: Acute pain Pain Location: Neck Pain Orientation: Posterior;Upper Pain Descriptors / Indicators: Discomfort;Sharp;Shooting Pain Onset: On-going Patients Stated Pain Goal: 3 Pain Intervention(s): RN admin med prior to therapy, emotional support   Therapy/Group: Individual Therapy  [name] 06/14/2019, 10:49 AM

## 2019-06-14 NOTE — Progress Notes (Signed)
Bowel program started at 18:30 with dig stim and ducolax suppository. Patient tolerated well. No results at this time. Will report off to oncoming staff. Gerald Stabs, RN

## 2019-06-14 NOTE — Consult Note (Addendum)
Neuropsychological Consultation   Patient:   Jacob Lambert   DOB:   [birth year]  MR Number:  [id number]  Location:  [facility] [id number] [place] [address] Dept: [place]: [phone]           Date of Service:   06/13/2019  Start Time:   4 PM End Time:   5 PM  Provider/Observer:  Ilean Skill, Psy.D.       Clinical Neuropsychologist       Billing Code/Service: [phone]  Chief Complaint:    Jacob Lambert is a 34 year old right-handed nonbinary individual with a history of tobacco abuse, anxiety and depression as well as headaches.  The patient has recently been living with parents due to recent medical needs but had prior lived in [place] as well as overseas.  The patient presented on 06/01/2019 with progressive ataxia and leg weakness since February 2020.  Recent MRI of the brain was negative.  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  Patient has been followed by Cecil Cobbs, MD medical oncologist as well as neurosurgery.  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  Final pathology grade 2 ependyoma.  Patient has residual muscle spasms and motor deficits and bowel disruption.  Patient was admitted for the comprehensive rehabilitation program.  Reason for Service:  The patient was referred for neuropsychological consultation due to coping and adjustment issues following significant motor deficits following the removal of a spinal cord tumor with residual motor deficits.  Below is the HPI for the current admission.  HPI: Jacob Lambert is a 34 year old right-handed individual with history of tobacco abuse, anxiety depression as well as headaches.  Per chart review patient is presently living with their parents although they does have longer term residencies both in [place] and overseas.  1 level home 2 steps to  entry independent with assistive device.  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  Father is an ED physician at [facility] [employer] [facility] [employer].  Presented 06/01/2019 with progressive ataxia and leg weakness since February 2021.  Recent MRI of the brain negative.  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  Patient has been followed by both Dr. Cecil Cobbs medical oncology as well as neurosurgery.  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 Venetia Constable.  Decadron protocol as indicated.  Final pathology grade 2 ependyoma.  Lovenox initiated for DVT prophylaxis 06/06/2019.  Pain control with the use of scheduled Neurontin 600 mg 3 times daily as well as Lidoderm patches.  They have been using Robaxin and Valium for muscle spasms as needed as well as oxycodone for breakthrough pain.  Bouts of constipation he did receive magnesium citrate with good results.  Therapy evaluations completed and patient was admitted for a comprehensive rehab program.  Current Status:  Upon arrival, the patient was laying down in the bed with slight head elevation.  Patient was alert and oriented with good mental status.  The patient was quite talkative and rapport and engagement were easily achieved.  I did inform the patient about the indirect connection I have as them father is an ED physician with [facility] and works with my wife.  The patient stated comfort level and I reiterated the issue of patient confidentiality.  The patient was having a great deal of  difficulty coping with the sudden change in overall status.  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  Patient has  struggled with how information regarding medical status should be communicated to friends and business community overall the social media information release which has been very stressful for the patient.  The patient has been to some degree catastrophizing and identifying acute levels of deficits assuming that all of motor deficits and other symptoms will be permanent at this level.  The patient has been making significant improvements with motor functions but continues to have considerable deficits.  On the other hand, there are legitimate concerns about recurrence of this tumor and significant risk of these types of tumors impacting overall life span globally.  The patient is having to deal with both acute risk as well as long-term concerns regarding diagnosis and treatment.  Concerns about how radiation therapies, if they are conducted, will complicate recovery and to what degree residual deficits will be there going forward.  Behavioral Observation: Jacob Lambert  presents as a 34 y.o.-year-old Left Caucasian Nonbinary individual who appeared Jacob Lambert "Jacob"'s stated age. Jacob Lambert "Jacob"'s dress was Appropriate and Jacob Lambert "Jacob" was Well Groomed and Jacob Lambert "Jacob"'s manners were Appropriate to the situation.  Jacob Lambert "Jacob"'s participation was indicative of Appropriate and Attentive behaviors.  There were physical disabilities noted.  Jacob Lambert "Jacob" displayed an appropriate level of cooperation and motivation.     Interactions:    Active Appropriate and Attentive  Attention:   within normal limits and although there were clearly issues with internal preoccupations.  Memory:   within normal limits; recent and remote memory intact  Visuo-spatial:  not examined  Speech (Volume):  normal  Speech:   normal; normal  Thought Process:  Coherent and Relevant  Though Content:  WNL; not suicidal and not homicidal  Orientation:   person, place, time/date and  situation  Judgment:   Good  Planning:   Fair  Affect:    Anxious and Tearful  Mood:    Dysphoric  Insight:   Good  Intelligence:   very high  Medical History:   Past Medical History:  Diagnosis Date  . Anxiety   . Depression   . Headache   . Pneumonia    "walking"        Abuse/Trauma History: The patient describes a number of situations throughout them life that have been very stressful and traumatic with they particularly with issues around how others in public have dealt with and reacted to they sexual orientation.  Psychiatric History:  No prior psychiatric history beyond issues with depression and anxiety.  The patient has dealt with depression in the past but this is been acutely affected by significant medical issues.  Family Med/Psych History: History reviewed. No pertinent family history.  Impression/DX:  Jacob Lambert is a 34 year old right-handed nonbinary individual with a history of tobacco abuse, anxiety and depression as well as headaches.  The patient has recently been living with parents due to recent medical needs but had prior lived in [place] as well as overseas.  The patient presented on 06/01/2019 with progressive ataxia and leg weakness since February 2020.  Recent MRI of the brain was negative.  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  Patient has been followed by Cecil Cobbs, MD medical  oncologist as well as neurosurgery.  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  Final pathology grade 2 ependyoma.  Patient has residual muscle spasms and motor deficits and bowel disruption.  Patient was admitted for the comprehensive rehabilitation program.  Upon arrival, the patient was laying down in the bed with slight head elevation.  Patient was alert and oriented with good mental status.  The patient was quite talkative and rapport and engagement were easily achieved.  I did inform the  patient about the indirect connection I have as them father is an ED physician with [employer] and works with my wife.  The patient stated comfort level and I reiterated the issue of patient confidentiality.  The patient was having a great deal of difficulty coping with the sudden change in overall status.  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  Patient has struggled with how information regarding medical status should be communicated to friends and business community overall the social media information release which has been very stressful for the patient.  The patient has been to some degree catastrophizing and identifying acute levels of deficits assuming that all of motor deficits and other symptoms will be permanent at this level.  The patient has been making significant improvements with motor functions but continues to have considerable deficits.  On the other hand, there are legitimate concerns about recurrence of this tumor and significant risk of these types of tumors impacting overall life span globally.  The patient is having to deal with both acute risk as well as long-term concerns regarding diagnosis and treatment.  Concerns about how radiation therapies, if they are conducted, will complicate recovery and to what degree residual deficits will be there going forward.  Disposition/Plan:  Will follow up with the patient on Friday morning to continue to work on coping and adjustment issues.        Electronically Signed   _______________________ Ilean Skill, Psy.D.

## 2019-06-14 NOTE — Progress Notes (Signed)
Occupational Therapy Weekly Progress Note  Patient Details  Name: Jacob Lambert MRN: [id number] Date of Birth: 1985-04-04  Beginning of progress report period: June 09, 2019 End of progress report period: June 14, 2019  Patient has met 1 of 4 short term goals.  Pt is making steady progress with bathing/dressing tasks, functional transfers, and standing balance.  Pt continues to require CGA/min A for standing balance and functional transfers. Pt completes bathing at shower level with use of guard rails for balance.  Dressing with sit<>stand from w/c at sink.  Grooming (brushing teeth) standing at sink using sink for support.  Pt is legally blind in R eye but has demonstrated improved scanning techniques/strategies when propelling in w/c and as demonstrated in [employer] activities.   Patient continues to demonstrate the following deficits: muscle weakness, impaired timing and sequencing, unbalanced muscle activation and decreased coordination and decreased standing balance, decreased postural control and decreased balance strategies and therefore will continue to benefit from skilled OT intervention to enhance overall performance with BADL and iADL.  Patient progressing toward long term goals..  Continue plan of care.  OT Short Term Goals Week 1:  OT Short Term Goal 1 (Week 1): Pt will transfer wiht S overall consistently with LRAD to toilet OT Short Term Goal 1 - Progress (Week 1): Progressing toward goal OT Short Term Goal 2 (Week 1): Pt will step over shower edge wiht CGA using LRAD OT Short Term Goal 2 - Progress (Week 1): Progressing toward goal OT Short Term Goal 3 (Week 1): Pt will complete LB dressing with S OT Short Term Goal 3 - Progress (Week 1): Progressing toward goal OT Short Term Goal 4 (Week 1): Pt will complete UB dressing wiht set up OT Short Term Goal 4 - Progress (Week 1): Met Week 2:  OT Short Term Goal 1 (Week 2): Pt will transfer wiht S overall consistently with LRAD to  toilet OT Short Term Goal 2 (Week 2): Pt will step over shower edge wiht CGA using LRAD OT Short Term Goal 3 (Week 2): Pt will complete LB dressing with S   Jacob Lambert 06/14/2019, 6:35 AM

## 2019-06-14 NOTE — Plan of Care (Signed)
  Problem: Consults Goal: RH SPINAL CORD INJURY PATIENT EDUCATION Description:  See Patient Education module for education specifics.  Outcome: Progressing   Problem: SCI BOWEL ELIMINATION Goal: RH STG MANAGE BOWEL WITH ASSISTANCE Description: STG Manage Bowel with min Assistance. Outcome: Progressing Goal: RH STG MANAGE BOWEL W/EQUIPMENT W/ASSISTANCE Description: STG Manage Bowel With Equipment With min Assistance Outcome: Progressing Goal: RH STG SCI MANAGE BOWEL PROGRAM W/ASSIST OR AS APPROPRIATE Description: STG SCI Manage bowel program w/min assist or as appropriate. Outcome: Progressing   Problem: SCI BLADDER ELIMINATION Goal: RH STG MANAGE BLADDER WITH EQUIPMENT WITH ASSISTANCE Description: STG Manage Bladder With Equipment With mod I Assistance Outcome: Progressing   Problem: RH SKIN INTEGRITY Goal: RH STG SKIN FREE OF INFECTION/BREAKDOWN Outcome: Progressing Goal: RH STG MAINTAIN SKIN INTEGRITY WITH ASSISTANCE Description: STG Maintain Skin Integrity With min Assistance. Outcome: Progressing   Problem: RH SAFETY Goal: RH STG ADHERE TO SAFETY PRECAUTIONS W/ASSISTANCE/DEVICE Description: STG Adhere to Safety Precautions With reminders each shift Outcome: Progressing   Problem: RH PAIN MANAGEMENT Goal: RH STG PAIN MANAGED AT OR BELOW PT'S PAIN GOAL Description: STG pt's pain level less than 4 on scale of 1-10 Outcome: Progressing   Problem: RH KNOWLEDGE DEFICIT SCI Goal: RH STG INCREASE KNOWLEDGE OF SELF CARE AFTER SCI Description: Pt. will understand/demonstrate management of bowel program with minimal assistance.  Pt. will demonstrate understanding of pain management techniques  Pt. will verbalize understanding of prescribed medications Outcome: Progressing

## 2019-06-14 NOTE — Progress Notes (Signed)
Occupational Therapy Session Note  Patient Details  Name: Nils Thor MRN: [id number] Date of Birth: August 11, 1985  Today's Date: 06/14/2019 OT Individual Time: 1300-1415 OT Individual Time Calculation (min): 75 min    Short Term Goals: Week 1:  OT Short Term Goal 1 (Week 1): Pt will transfer wiht S overall consistently with LRAD to toilet OT Short Term Goal 1 - Progress (Week 1): Progressing toward goal OT Short Term Goal 2 (Week 1): Pt will step over shower edge wiht CGA using LRAD OT Short Term Goal 2 - Progress (Week 1): Progressing toward goal OT Short Term Goal 3 (Week 1): Pt will complete LB dressing with S OT Short Term Goal 3 - Progress (Week 1): Progressing toward goal OT Short Term Goal 4 (Week 1): Pt will complete UB dressing wiht set up OT Short Term Goal 4 - Progress (Week 1): Met  Skilled Therapeutic Interventions/Progress Updates:    1;1. Pt received in bed agreeable to OT and receiving pain medication. Pt completes all squat pivot transfers with S overall to level and unlevel surfaces. Pt very eager to participate in adaptive yoga session. This clinician led pt through supine stretches for hips, hamstrings, low back, gentle lumbar only twists and sun salutations in supine. Pt with focus on deep diaphramatic breathing techniques thorughut session for mindfulness, pain management, and body awareness. Pt completes seated hip opening stretches with gentle forward bending at waiste only as well as hip extension stretches in warrior 2/supported lunge poses on standard chair with yoga block supporting extended knee. incorpoated progressive muscle relaxation as mindfulness practice to presence pt to body awareness. Pt very grateful for practice and able to voice techinues to use outside of OT. Exited session with pt seated in bed exit alarm on an dclal light in reach  Therapy Documentation Precautions:  Precautions Precautions: Fall Precaution Booklet Issued: No Precaution Comments:  watch BP and HR Restrictions Weight Bearing Restrictions: No General:   Vital Signs: Therapy Vitals Temp: 98.8 F (37.1 C) Pulse Rate: 92 Resp: 17 BP: 112/75 Patient Position (if appropriate): Lying Oxygen Therapy SpO2: 100 % O2 Device: Room Air Pain:   ADL: ADL Eating: Supervision/safety Grooming: Minimal assistance Where Assessed-Grooming: Sitting at sink Upper Body Bathing: Supervision/safety Where Assessed-Lower Body Bathing: Edge of bed Upper Body Dressing: Maximal assistance(shoes only) Vision   Perception    Praxis   Exercises:   Other Treatments:     Therapy/Group: Individual Therapy  [name] 06/14/2019, 4:09 PM

## 2019-06-14 NOTE — Progress Notes (Addendum)
Physical Therapy Session Note  Patient Details  Name: Jacob Lambert MRN: [id number] Date of Birth: August 09, 1985  Today's Date: 06/14/2019 PT Individual Time: 1000-1100 PT Individual Time Calculation (min): 60 min  Short Term Goals: Week 1:  PT Short Term Goal 1 (Week 1): Pt will complete transfers with Supervision and LRAD PT Short Term Goal 2 (Week 1): Pt will ambulate x 150 ft with RW at Supervision level PT Short Term Goal 3 (Week 1): Pt will initiate stair training as safe and able  Skilled Therapeutic Interventions/Progress Updates:   Pt received supine in bed finishing breakfast. Agreeable to therapy. Pt states they are experiencing "mild" L knee pain. Pt is independent with bed mobility. Performed superior-directed low-grade patellar mobilizations with oscillations x30 seconds, followed by medial-directed low-grade patellar mobilizations with oscillations x30 seconds to decrease L knee pain. Discussed with pt the potential benefits of kinesiotape to stabilize the L knee joint, and pt is agreeable to try it. In supine with the L knee slightly flexed, three strips of kinesiotape were applied to the L knee. One positioned at base of patella with 50% stretch and two placed from mid-quad to lateral and medial aspects of patella with 25% stretch at the top and 50% stretch at patella for overall stabilization and pain management. Pt informed of wearing schedule for kinesiotape and to remove if skin irritation occurs. Seated at EOB, pt's BP is 108/76 with a HR of 80. Sit to stand with RW performed at a CGA level throughout session. Pt ambulated 100 feet to the therapy gym with RW with min A. Occasional buckling and scissoring of RLE noted during ambulation. Pt states L knee pain improved with kinesiotape, however they still c/o R knee pain which worsens with movement. Stand to sit with RW performed at a CGA level throughout session. Next, pt ambulated 25 feet forwards and then backwards with RW with min A for  LE strengthening. Verbal cues required to promote step-to gait pattern while ambulating backwards to promote stability. Pt experienced lightheadedness on completion of ambulation. Seated on mat table, pt's BP and HR were WNL. Pt reported lightheadedness resolved with sitting. Pt is independent with mobility on therapy mat. Performed exercises in supine to strengthen the LEs: bridges 2x10, heel slides 1x10 each leg, and adductor squeeze 1x10. Dependent W/C transfer back to patient room d/t decreased endurance and time conservation. Squat pivot transfer from W/C to EOB with CGA. Pt left supine in bed with all needs in reach. Ice pack placed on pt's R knee for pain management. Pt given coffee. All questions answered.  Therapy Documentation Precautions:  Precautions Precautions: Fall Precaution Booklet Issued: No Precaution Comments: watch BP and HR Restrictions Weight Bearing Restrictions: No  Therapy/Group: Individual Therapy  Nadeen Landau, SPT  06/14/2019, 3:32 PM

## 2019-06-15 ENCOUNTER — Inpatient Hospital Stay (HOSPITAL_COMMUNITY): Payer: BC Managed Care – PPO

## 2019-06-15 ENCOUNTER — Inpatient Hospital Stay (HOSPITAL_COMMUNITY): Payer: BC Managed Care – PPO | Admitting: Occupational Therapy

## 2019-06-15 NOTE — Progress Notes (Addendum)
Occupational Therapy Session Note  Patient Details  Name: Jacob Lambert MRN: [id number] Date of Birth: 28-Nov-1985  Today's Date: 06/15/2019 OT Individual Time: 1400-1430 OT Individual Time Calculation (min): 30 min    Short Term Goals:  Week 2:  OT Short Term Goal 1 (Week 2): Pt will transfer wiht S overall consistently with LRAD to toilet OT Short Term Goal 2 (Week 2): Pt will step over shower edge wiht CGA using LRAD OT Short Term Goal 3 (Week 2): Pt will complete LB dressing with S   Skilled Therapeutic Interventions/Progress Updates:    Upon entering the room, pt supine in bed and listening to music verbalized lyrics helping them through  current situation. Pt appears to be very realistic and has a great attitude about hospital course. Pt does report feeling fatigued and concerns of the "unknown" moving forward with radiation treatments. OT provided paper handouts on energy conservation techniques in regards to self care tasks and home management. OT began education with brief descriptions and education to continue on this topic. Pt remained in bed with call bell and all needed items within reach.   Therapy Documentation Precautions:  Precautions Precautions: Fall Precaution Booklet Issued: No Precaution Comments: watch BP and HR Restrictions Weight Bearing Restrictions: No General:   Vital Signs: Therapy Vitals Temp: 98.4 F (36.9 C) Pulse Rate: 83 Resp: 14 BP: 106/68 Patient Position (if appropriate): Lying Oxygen Therapy SpO2: 100 % O2 Device: Room Air Pain:   ADL: ADL Eating: Supervision/safety Grooming: Minimal assistance Where Assessed-Grooming: Sitting at sink Upper Body Bathing: Supervision/safety Where Assessed-Lower Body Bathing: Edge of bed Upper Body Dressing: Maximal assistance (shoes only) Vision   Perception    Praxis   Exercises:   Other Treatments:     Therapy/Group: Individual Therapy  [name] 06/15/2019, 2:58 PM

## 2019-06-15 NOTE — Progress Notes (Signed)
[place] PHYSICAL MEDICINE & REHABILITATION PROGRESS NOTE   Subjective/Complaints:    Pt reports bowels worked really fast last night- <1 hour after bowel program done, had results- "still hurt" but less so.   No other complaints.     ROS:   Pt denies SOB, abd pain, CP, N/V/C/D, and vision changes   Objective:   No results found. No results for input(s): WBC, HGB, HCT, PLT in the last 72 hours. No results for input(s): NA, K, CL, CO2, GLUCOSE, BUN, CREATININE, CALCIUM in the last 72 hours.  Intake/Output Summary (Last 24 hours) at 06/15/2019 0905 Last data filed at 06/15/2019 0800 Gross per 24 hour  Intake 762 ml  Output 450 ml  Net 312 ml     Physical Exam: Vital Signs Blood pressure 106/67, pulse 94, temperature 98.6 F (37 C), temperature source Oral, resp. rate 16, height 5' 10.5" (1.791 m), weight 70.6 kg, SpO2 99 %. Constitutional: No distress . Sitting up at EOB, appropriate, PT in room, NAD HENT: Normocephalic.  Atraumatic. Eyes: conjugate gaze Cardiovascular: no JVD Respiratory: no resp distress or accessory muscle use.  GI: nondistended Skin: Warm and dry.  Intact. Psych: bright affect Musc: No edema in extremities.  No tenderness in extremities. Neuro: Alert Facial muscles intact Motor: Right upper extremity: Grossly 4+-5/5 proximal distal Left upper extremity: 4 -/5 proximal distal Right lower extremity: Hip flexion, knee extension, ankle dorsiflexion 4 -/5 proximal distal Left lower extremity: Hip flexion, knee extension 3 -/5, ankle dorsiflexion 3+/5  Assessment/Plan: 1. 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  Physiatrist is providing close team supervision and 24 hour management of active medical problems listed below.  Physiatrist and rehab team continue to assess barriers to discharge/monitor patient  progress toward functional and medical goals  Care Tool:  Bathing    Body parts bathed by patient: Right arm, Left arm, Chest, Abdomen, Front perineal area, Right upper leg, Left upper leg, Right lower leg, Left lower leg, Face   Body parts bathed by helper: Buttocks     Bathing assist Assist Level: Minimal Assistance - Patient > 75%     Upper Body Dressing/Undressing Upper body dressing   What is the patient wearing?: Pull over shirt    Upper body assist Assist Level: Set up assist    Lower Body Dressing/Undressing Lower body dressing      What is the patient wearing?: Skirt, Underwear/pull up     Lower body assist Assist for lower body dressing: Contact Guard/Touching assist     Toileting Toileting    Toileting assist Assist for toileting: Dependent - Patient 0% Assistive Device Comment: urinal   Transfers Chair/bed transfer  Transfers assist     Chair/bed transfer assist level: Contact Guard/Touching assist     Locomotion Ambulation   Ambulation assist      Assist level: Minimal Assistance - Patient > 75% Assistive device: Walker-rolling Max distance: 150 feet   Walk 10 feet activity   Assist     Assist level: Minimal Assistance - Patient > 75% Assistive device: Walker-rolling   Walk 50 feet activity   Assist    Assist level: Minimal Assistance - Patient > 75% Assistive device: Walker-rolling    Walk 150 feet activity   Assist    Assist level: Minimal Assistance - Patient > 75% Assistive device: Walker-rolling    Walk 10 feet on  uneven surface  activity   Assist Walk 10 feet on uneven surfaces activity did not occur: Safety/medical concerns         Wheelchair     Assist Will patient use wheelchair at discharge?: Yes Type of Wheelchair: Manual    Wheelchair assist level: Independent Max wheelchair distance: 150'    Wheelchair 50 feet with 2 turns activity    Assist        Assist Level: Independent    Wheelchair 150 feet activity     Assist      Assist Level: Independent   Blood pressure 106/67, pulse 94, temperature 98.6 F (37 C), temperature source Oral, resp. rate 16, height 5' 10.5" (1.791 m), weight 70.6 kg, SpO2 99 %.  Medical Problem List and Plan: 1.  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  Status post C6-7 laminoplasty resection of spinal cord tumor 06/01/2019.  Decadron taper completed  Continue CIR 2.  Antithrombotics: -DVT/anticoagulation: Lovenox.  Will need for 2-3 months depending on walking ability  Vascular study negative for DVT             -antiplatelet therapy: N/A 3. Pain Management: Neurontin 600 mg 3 times daily, Lidoderm patch as directed, Valium 10 mg every 6 hours as needed muscle spasms, Robaxin 750 mg every 6 hours as needed muscle spasms, oxycodone as needed  Topamax 25 daily started on 6/6 for migraines per patient request  6/8- change to QHS so doesn't sedate pt. Change lidoderm to 8am to 8pm- changed.   6/10- pain better controlled  4. Mood: - don't think pt needs meds- will add if needed  6/9- will add Celexa 20 mg daily for affect/mood  6/10- no complaints of side effects this AM             -antipsychotic agents: N/A 5. Neuropsych: This patient is capable of making decisions on their own behalf. 6. Skin/Wound Care: Routine skin checks 7. Fluids/Electrolytes/Nutrition: Routine in and outs  BMP within acceptable range on 6/4  6/8- looks good 6/7- no issues 8.  Neurogenic bowel.    Added suppository nightly and digital stimulation of rectum to improve constipation and bowel movements- if recovers ability to have BMs on their own, will stop.   6/7- no good results with bowel program- will add Miralax to senokot in AM and con't dig stim/bowel program  6/8- make sure bowel program AFTER dinner for ileocolic reflex- also add Colace 100 mg daily for hard stools.    6/9- said some hard pieces still- was documented as #6 stools- so soft- don't want to add more Colace as of yet.   6/10- <1 hour til results after bowel program last night- better  9. Minimal PVRs- will not reorder PVRs- voiding OK-  10. Spasticity- explained it's normal that it gets worse over time- is very mild right now- will monitor  6/7- on valium 10 mg q6 hours prn for spasms 11. Leukocytosis  WBCs 13.8 on 6/4, labs ordered for tomorrow  6/7- WBC down to 11.7k- doing well  Afebrile 12.  Transaminitis  LFTs elevated on 6/4, labs ordered for tomorrow  6/7- actually slightly higher- 58/195- from 48/188- not on any meds that would cause it- will follow 13. Tachycardia  6/7- Will add metoprolol 12.5 mg BID for fast heart rate  6/9- Heart rate ~ 100-  6/10- heart rate ~95 -doing well with no orthostatic hypotension.  LOS: 7 days A FACE TO FACE EVALUATION WAS PERFORMED  Jacob Lambert 06/15/2019, 9:05 AM

## 2019-06-15 NOTE — Progress Notes (Signed)
Occupational Therapy Session Note  Patient Details  Name: Kelechi Orgeron MRN: [id number] Date of Birth: [birth year]  Today's Date: 06/15/2019 OT Individual Time: 3403-5248 OT Individual Time Calculation (min): 46 min    Short Term Goals: Week 2:  OT Short Term Goal 1 (Week 2): Pt will transfer wiht S overall consistently with LRAD to toilet OT Short Term Goal 2 (Week 2): Pt will step over shower edge wiht CGA using LRAD OT Short Term Goal 3 (Week 2): Pt will complete LB dressing with S  Skilled Therapeutic Interventions/Progress Updates:    Patient in bed, alert and ready for therapy .  Denies pain at this time.  Bed mobility completed with CS.  Sit pivot transfer bed to w/c with CS.  Able to propel w/c to/from therapy gym.  Completed standing dynamic balance/stepping activity with both one and two steps CGA - no LOB and good tolerance for stance >10 minutes.  Reviewed left hand mobility impairment - provided intrinsic hand exercises - trial of coban to guide ulnar aspect with good results.  Returned to bed at close of session with CS.  Bed alarm set and call bell in reach.    Therapy Documentation Precautions:  Precautions Precautions: Fall Precaution Booklet Issued: No Precaution Comments: watch BP and HR Restrictions Weight Bearing Restrictions: No   Therapy/Group: Individual Therapy  Carlos Levering 06/15/2019, 7:53 AM

## 2019-06-15 NOTE — Progress Notes (Signed)
Upon entering the room both family members were present. Patient stated they wanted to do the bowel program after 7 because they had just finished eating and to spend more time with family. Patient stated they had bowel program later in the shift yesterday and had successful results and would like to do that again tonight. Nurse stated she would pass along to oncoming shift. [place]

## 2019-06-15 NOTE — Progress Notes (Signed)
Occupational Therapy Session Note  Patient Details  Name: Jacob Lambert MRN: [id number] Date of Birth: [birth year]  Today's Date: 06/15/2019 OT Individual Time: 0063-4949 OT Individual Time Calculation (min): 58 min    Short Term Goals: Week 1:  OT Short Term Goal 1 (Week 1): Pt will transfer wiht S overall consistently with LRAD to toilet OT Short Term Goal 1 - Progress (Week 1): Progressing toward goal OT Short Term Goal 2 (Week 1): Pt will step over shower edge wiht CGA using LRAD OT Short Term Goal 2 - Progress (Week 1): Progressing toward goal OT Short Term Goal 3 (Week 1): Pt will complete LB dressing with S OT Short Term Goal 3 - Progress (Week 1): Progressing toward goal OT Short Term Goal 4 (Week 1): Pt will complete UB dressing wiht set up OT Short Term Goal 4 - Progress (Week 1): Met  Skilled Therapeutic Interventions/Progress Updates:    1;1. Pt received in w/c agreeable to OT and no pain reported. tp completes transfer into shower with S-CGA and RW. Pt completes bathing seated with supervision. Pt completes dressing sit to stand at sink with S overall and use of bag to don teds. Pt requires VC for knee extension in stance. Pt completes oral care seated at sink in w/c. Pt requesting to work on L hand. Pt completes [facility] activity at tabletop loading beads into pill organizer with LUE. Skills focused on in activity: palm<>finger translation, tip to tip pinch and grasp/release. Pt sweating during activity given cool cloth. Pt with normal vitals.   Therapy Documentation Precautions:  Precautions Precautions: Fall Precaution Booklet Issued: No Precaution Comments: watch BP and HR Restrictions Weight Bearing Restrictions: No General:   Vital Signs: Therapy Vitals Temp: 98.6 F (37 C) Temp Source: Oral Pulse Rate: 94 Resp: 16 BP: 106/67 Patient Position (if appropriate): Lying Oxygen Therapy SpO2: 99 % O2 Device: Room Air Pain:   ADL: ADL Eating:  Supervision/safety Grooming: Minimal assistance Where Assessed-Grooming: Sitting at sink Upper Body Bathing: Supervision/safety Where Assessed-Lower Body Bathing: Edge of bed Upper Body Dressing: Maximal assistance (shoes only) Vision   Perception    Praxis   Exercises:   Other Treatments:     Therapy/Group: Individual Therapy  [name] 06/15/2019, 7:00 AM

## 2019-06-15 NOTE — Progress Notes (Signed)
Physical Therapy Session Note  Patient Details  Name: Jacob Lambert MRN: [id number] Date of Birth: [birth year]  Today's Date: 06/15/2019 PT Individual Time: 0800-0900 PT Individual Time Calculation (min): 60 min   Short Term Goals: Week 1:  PT Short Term Goal 1 (Week 1): Pt will complete transfers with Supervision and LRAD PT Short Term Goal 2 (Week 1): Pt will ambulate x 150 ft with RW at Supervision level PT Short Term Goal 3 (Week 1): Pt will initiate stair training as safe and able Week 2:    Week 3:     Skilled Therapeutic Interventions/Progress Updates:    PAIN denies pain this am  Pt initially supine and agreeable to session.  Max assist for donning ted hose, supervision for upper and lowerbody dressing in bed. Supine to sit w/rails and supervision.  Pt sits at edge of bed w/supervision, MD in to talk w/pt.  Squat pivot transfer w/supervision to wc. Pt propels mod I to gym in wc. STS w/cga and gait w/RW 57ft w/cga as warm up activity.  Functional Gait training to address strength/balance/gait:  Gait course including stepping over 6in foam, stepping onto and balancing on foam mat x 1 min eyes open, 15 count eyes closed without UE support, weaving around cones, leaning and touching cone on 5in step, squatting and touching cones on floor to L/R, overall distance including turning and repeating course 59ft all w/cga.    Repeated functional gait course x 2, no balance loss or signficant instability w/activities when using RW  Gait 11ft using handrail to R w/min assist, increased deviations including increased impact w/intial contact on L>R, mild scissoring and steppage type gait L>R, mild hip instability, mild tremors bilat LEs.  Pt requires rest breaks in sitting.  HR monitored w/all activites and did not exceed 128 entire session.    wc propulsion mod I back to room.  Pt handed over to OT for next session.    Therapy Documentation Precautions:  Precautions Precautions:  Fall Precaution Booklet Issued: No Precaution Comments: watch BP and HR Restrictions Weight Bearing Restrictions: No    Therapy/Group: Individual Therapy  Callie Fielding, [place] 06/15/2019, 4:41 PM

## 2019-06-16 ENCOUNTER — Inpatient Hospital Stay (HOSPITAL_COMMUNITY): Payer: BC Managed Care – PPO

## 2019-06-16 ENCOUNTER — Inpatient Hospital Stay (HOSPITAL_COMMUNITY): Payer: BC Managed Care – PPO | Admitting: Occupational Therapy

## 2019-06-16 ENCOUNTER — Encounter (HOSPITAL_COMMUNITY): Payer: BC Managed Care – PPO | Admitting: Psychology

## 2019-06-16 DIAGNOSIS — R52 Pain, unspecified: Secondary | ICD-10-CM

## 2019-06-16 MED ORDER — BACLOFEN 5 MG HALF TABLET
5.0000 mg | ORAL_TABLET | Freq: Three times a day (TID) | ORAL | Status: DC
  Administered 2019-06-16 – 2019-06-18 (×6): 5 mg via ORAL
  Filled 2019-06-16 (×6): qty 1

## 2019-06-16 NOTE — Progress Notes (Signed)
[place] PHYSICAL MEDICINE & REHABILITATION PROGRESS NOTE   Subjective/Complaints:    Pt having more spasms- very annoying- sometimes affects function.   Wants ot try Baclofen.  Had a couple of incidents on RW that were hard because of spasms.   L 3rd-5th digits still painfull and numb- nerve pain.   BM <1 hr after suppository on toilet- working better.     ROS:   Pt denies SOB, abd pain, CP, N/V/C/D, and vision changes  Objective:   No results found. No results for input(s): WBC, HGB, HCT, PLT in the last 72 hours. No results for input(s): NA, K, CL, CO2, GLUCOSE, BUN, CREATININE, CALCIUM in the last 72 hours.  Intake/Output Summary (Last 24 hours) at 06/16/2019 0842 Last data filed at 06/16/2019 0751 Gross per 24 hour  Intake 940 ml  Output 350 ml  Net 590 ml     Physical Exam: Vital Signs Blood pressure 112/71, pulse 88, temperature 98.4 F (36.9 C), resp. rate 18, height 5' 10.5" (1.791 m), weight 70.6 kg, SpO2 99 %. Constitutional: No distress . Sitting EOB; PT in room- able to get to EOB Mod I with bedrails, NAD HENT: Normocephalic.  Atraumatic. Eyes: conjugate gaze Cardiovascular: no JVD Respiratory:no accessory muscle use GI: nondistended; soft Skin: Warm and dry.  Intact. Psych: bright affect Musc: No edema in extremities.  No tenderness in extremities. Neuro: Alert Facial muscles intact Motor: Right upper extremity: Grossly 4+-5/5 proximal distal Left upper extremity: 4 -/5 proximal distal Right lower extremity: Hip flexion, knee extension, ankle dorsiflexion 4 -/5 proximal distal Left lower extremity: Hip flexion, knee extension 3 -/5, ankle dorsiflexion 3+/5  Assessment/Plan: 1. 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  Physiatrist is providing close team supervision and 24 hour management of active  medical problems listed below.  Physiatrist and rehab team continue to assess barriers to discharge/monitor patient progress toward functional and medical goals  Care Tool:  Bathing    Body parts bathed by patient: Right arm, Left arm, Chest, Abdomen, Front perineal area, Right upper leg, Left upper leg, Right lower leg, Left lower leg, Face   Body parts bathed by helper: Buttocks     Bathing assist Assist Level: Minimal Assistance - Patient > 75%     Upper Body Dressing/Undressing Upper body dressing   What is the patient wearing?: Pull over shirt    Upper body assist Assist Level: Set up assist    Lower Body Dressing/Undressing Lower body dressing      What is the patient wearing?: Skirt, Underwear/pull up     Lower body assist Assist for lower body dressing: Contact Guard/Touching assist     Toileting Toileting    Toileting assist Assist for toileting: Dependent - Patient 0% Assistive Device Comment: urinal   Transfers Chair/bed transfer  Transfers assist     Chair/bed transfer assist level: Contact Guard/Touching assist     Locomotion Ambulation   Ambulation assist      Assist level: Contact Guard/Touching assist Assistive device: Walker-rolling Max distance: 120   Walk 10 feet activity   Assist     Assist level: Contact Guard/Touching assist Assistive device: Walker-rolling   Walk 50 feet activity   Assist    Assist level: Contact Guard/Touching assist Assistive device: Walker-rolling    Walk 150 feet activity   Assist    Assist level: Minimal Assistance -  Patient > 75% Assistive device: Walker-rolling    Walk 10 feet on uneven surface  activity   Assist Walk 10 feet on uneven surfaces activity did not occur: Safety/medical concerns         Wheelchair     Assist Will patient use wheelchair at discharge?: Yes Type of Wheelchair: Manual    Wheelchair assist level: Independent Max wheelchair distance: 150'     Wheelchair 50 feet with 2 turns activity    Assist        Assist Level: Independent   Wheelchair 150 feet activity     Assist      Assist Level: Independent   Blood pressure 112/71, pulse 88, temperature 98.4 F (36.9 C), resp. rate 18, height 5' 10.5" (1.791 m), weight 70.6 kg, SpO2 99 %.  Medical Problem List and Plan: 1.  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  Status post C6-7 laminoplasty resection of spinal cord tumor 06/01/2019.  Decadron taper completed  Continue CIR 2.  Antithrombotics: -DVT/anticoagulation: Lovenox.  Will need for 2-3 months depending on walking ability  Vascular study negative for DVT             -antiplatelet therapy: N/A 3. Pain Management: Neurontin 600 mg 3 times daily, Lidoderm patch as directed, Valium 10 mg every 6 hours as needed muscle spasms, Robaxin 750 mg every 6 hours as needed muscle spasms, oxycodone as needed  Topamax 25 daily started on 6/6 for migraines per patient request  6/8- change to QHS so doesn't sedate pt. Change lidoderm to 8am to 8pm- changed.   6/10- pain better controlled  4. Mood: - don't think pt needs meds- will add if needed  6/9- will add Celexa 20 mg daily for affect/mood  6/10- no complaints of side effects this AM             -antipsychotic agents: N/A 5. Neuropsych: This patient is capable of making decisions on their own behalf. 6. Skin/Wound Care: Routine skin checks 7. Fluids/Electrolytes/Nutrition: Routine in and outs  BMP within acceptable range on 6/4  6/8- looks good 6/7- no issues 8.  Neurogenic bowel.    Added suppository nightly and digital stimulation of rectum to improve constipation and bowel movements- if recovers ability to have BMs on their own, will stop.   6/10- <1 hour til results after bowel program last night- better 6/11- BM <1 hr after suppository- doing better 9. Minimal PVRs- will not  reorder PVRs- voiding OK-  10. Spasticity- explained it's normal that it gets worse over time- is very mild right now- will monitor  6/7- on valium 10 mg q6 hours prn for spasms 11. Leukocytosis  WBCs 13.8 on 6/4, labs ordered for tomorrow  6/7- WBC down to 11.7k- doing well  Afebrile 12.  Transaminitis  LFTs elevated on 6/4, labs ordered for tomorrow  6/7- actually slightly higher- 58/195- from 48/188- not on any meds that would cause it- will follow 13. Tachycardia  6/7- Will add metoprolol 12.5 mg BID for fast heart rate  6/9- Heart rate ~ 100-  6/10- heart rate ~95 -doing well with no orthostatic hypotension.   14. Spasticity  6/11- will add Baclofen 5 mg TID for now  LOS: 8 days A FACE TO FACE EVALUATION WAS PERFORMED  Jacob Lambert 06/16/2019, 8:42 AM

## 2019-06-16 NOTE — Progress Notes (Signed)
Occupational Therapy Session Note  Patient Details  Name: Jacob Lambert MRN: [id number] Date of Birth: May 05, 1985  Today's Date: 06/16/2019 OT Individual Time: 1400-1500 OT Individual Time Calculation (min): 60 min    Short Term Goals: Week 1:  OT Short Term Goal 1 (Week 1): Pt will transfer wiht S overall consistently with LRAD to toilet OT Short Term Goal 1 - Progress (Week 1): Progressing toward goal OT Short Term Goal 2 (Week 1): Pt will step over shower edge wiht CGA using LRAD OT Short Term Goal 2 - Progress (Week 1): Progressing toward goal OT Short Term Goal 3 (Week 1): Pt will complete LB dressing with S OT Short Term Goal 3 - Progress (Week 1): Progressing toward goal OT Short Term Goal 4 (Week 1): Pt will complete UB dressing wiht set up OT Short Term Goal 4 - Progress (Week 1): Met  Skilled Therapeutic Interventions/Progress Updates:    1:1. Pt received in bed agreeable to OT. Pt with no pain reported. Pt agreeable to working on typing/discussing work needs to address impairments in LUE. Pt completes squat pivot transfers EOB<>w/c with VC for w/c parts management and CGA-S. Pt completes w/c propulsion to/from dayroom with set up. Pt completes typing and mouse manipulation with BUE but demo difficulty being able to reach typing letters with digits 3-5 on LUE. 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 Pt returns to room as stated above and Ot exits with pt in bed, exit alarm on and call light in reach  Therapy Documentation Precautions:  Precautions Precautions: Fall Precaution Booklet Issued: No Precaution Comments: watch BP and HR Restrictions Weight Bearing Restrictions: No General:   Vital Signs: Therapy Vitals Pulse Rate: 80 Resp: 18 BP: 119/81 Patient Position (if appropriate): Lying Oxygen Therapy SpO2: 97 % O2 Device: Room  Air Pain:   ADL: ADL Eating: Supervision/safety Grooming: Minimal assistance Where Assessed-Grooming: Sitting at sink Upper Body Bathing: Supervision/safety Where Assessed-Lower Body Bathing: Edge of bed Upper Body Dressing: Maximal assistance (shoes only) Vision   Perception    Praxis   Exercises:   Other Treatments:     Therapy/Group: Individual Therapy  [name] 06/16/2019, 2:58 PM

## 2019-06-16 NOTE — Progress Notes (Signed)
Completed bowel program. Pt had medium sized bowel movement that was loose with soft blobs. Pt tolerated well. No other concerns to report.

## 2019-06-16 NOTE — Consult Note (Signed)
Neuropsychological Consultation   Patient:   Jacob Lambert   DOB:   [birth year]  MR Number:  [id number]  Location:  [facility] [id number] [place] [address] Dept: [place]: [phone]           Date of Service:   06/16/2019  Start Time:   10 AM End Time:   11 AM  Provider/Observer:  Ilean Skill, Psy.D.       Clinical Neuropsychologist       Billing Code/Service: 96158/96159  Chief Complaint:    Jacinto Reap Cham is a 34 year old right-handed nonbinary individual with a history of tobacco abuse, anxiety and depression as well as headaches.  The patient has recently been living with parents due to recent medical needs but had prior lived in [place] as well as overseas.  The patient presented on 06/01/2019 with progressive ataxia and leg weakness since February 2020.  Recent MRI of the brain was negative.  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  Patient has been followed by Cecil Cobbs, MD medical oncologist as well as neurosurgery.  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  Final pathology grade 2 ependyoma.  Patient has residual muscle spasms and motor deficits and bowel disruption.  Patient was admitted for the comprehensive rehabilitation program.  06/16/19:  Patient is in an improved mood and seeing functional gains in mobility.  Still with significant deficits but improving and dealing with ongoing medical decisions about when radiation and other further medical timing.    Reason for Service:  The patient was referred for neuropsychological consultation due to coping and adjustment issues following significant motor deficits following the removal of a spinal cord tumor with residual motor deficits.  Below is the HPI for the current admission.  HPI: [name] is a 34 year old right-handed individual  with history of tobacco abuse, anxiety depression as well as headaches.  Per chart review patient is presently living with their parents although they does have longer term residencies both in [place] and [place].  1 level home 2 steps to entry independent with assistive device.  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  Father is an ED physician at [facility].  Presented 06/01/2019 with progressive ataxia and leg weakness since February 2021.  Recent MRI of the brain negative.  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  Patient has been followed by both Dr. Cecil Cobbs medical oncology as well as neurosurgery.  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 Venetia Constable.  Decadron protocol as indicated.  Final pathology grade 2 ependyoma.  Lovenox initiated for DVT prophylaxis 06/06/2019.  Pain control with the use of scheduled Neurontin 600 mg 3 times daily as well as Lidoderm patches.  They have been using Robaxin and Valium for muscle spasms as needed as well as oxycodone for breakthrough pain.  Bouts of constipation he did receive magnesium citrate with good results.  Therapy evaluations completed and patient was admitted for a comprehensive rehab program.  Current Status:  06/13/19:  Upon arrival, the patient was laying down in the bed with slight head elevation.  Patient was alert and oriented with good mental status.  The patient was quite talkative and rapport and engagement were easily achieved.  I did inform the patient about the indirect connection  I have as patient's father is an ED physician with [facility] and works with my wife.  The patient stated comfort level and I reiterated the issue of patient confidentiality.  The patient was having a great deal of difficulty coping with the sudden change in overall status.  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  Patient has struggled with how information regarding medical status should be communicated to friends and business community overall the social media information release which has been very stressful for the patient.  The patient has been to some degree catastrophizing and identifying acute levels of deficits assuming that all of motor deficits and other symptoms will be permanent at this level.  The patient has been making significant improvements with motor functions but continues to have considerable deficits.  On the other hand, there are legitimate concerns about recurrence of this tumor and significant risk of these types of tumors impacting overall life span globally.  The patient is having to deal with both acute risk as well as long-term concerns regarding diagnosis and treatment.  Concerns about how radiation therapies, if they are conducted, will complicate recovery and to what degree residual deficits will be there going forward.  06/16/2019:  Upon entering room, patient was in bed patient listening to music they found inspirational.  In positive mood state and described positive improvements and gains being made in therapy.  Alert and oriented.  Motivated to continue with therapies and continue to make functional gains.  Still considerable concerns, appropriately so, about how next steps in medical care will be timed and impact other parts of B's life, including how it will impact patient's company.    Behavioral Observation: Cyris Maalouf  presents as a 34 y.o.-year-old Left Caucasian Nonbinary individual who appeared Endi Lagman "B"'s stated age. [name] "B"'s dress was Appropriate and Bohdan Macho "B" was Well Groomed and Kyi Romanello "B"'s manners were  Appropriate to the situation.  Slayter Moorhouse "B"'s participation was indicative of Appropriate and Attentive behaviors.  There were physical disabilities noted.  [name] "B" displayed an appropriate level of cooperation and motivation.     Interactions:    Active Appropriate and Attentive  Attention:   within normal limits and although there were clearly issues with internal preoccupations.  Memory:   within normal limits; recent and remote memory intact  Visuo-spatial:  not examined  Speech (Volume):  normal  Speech:   normal; normal  Thought Process:  Coherent and Relevant  Though Content:  WNL; not suicidal and not homicidal  Orientation:   person, place, time/date and situation  Judgment:   Good  Planning:   Fair  Affect:    Anxious and Tearful  Mood:    Dysphoric  Insight:   Good  Intelligence:   very high  Medical History:   Past Medical History:  Diagnosis Date  . Anxiety   . Depression   . Headache   . Pneumonia    "walking"        Abuse/Trauma History: The patient describes a number of situations throughout them life that have been very stressful and traumatic with they particularly with issues around how others in public have dealt with and reacted to they sexual orientation.  Psychiatric History:  No prior psychiatric history beyond issues with depression and anxiety.  The patient has dealt with depression in the past but this is been acutely affected by significant medical issues.  Family Med/Psych History: History reviewed. No pertinent family history.  Impression/DX:  B Limas is a 34 year old right-handed nonbinary individual with a history of tobacco abuse, anxiety and depression as well as headaches.  The patient has recently been living with parents due to recent medical needs but had prior lived in [place] as well as [place].  The patient presented on 06/01/2019 with progressive ataxia and leg weakness since February 2020.  Recent MRI of the brain  was negative.  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  Patient has been followed by Cecil Cobbs, MD medical oncologist as well as neurosurgery.  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  Final pathology grade 2 ependyoma.  Patient has residual muscle spasms and motor deficits and bowel disruption.  Patient was admitted for the comprehensive rehabilitation program.  Upon arrival, the patient was laying down in the bed with slight head elevation.  Patient was alert and oriented with good mental status.  The patient was quite talkative and rapport and engagement were easily achieved.  I did inform the patient about the indirect connection I have as them father is an ED physician with [facility] and works with my wife.  The patient stated comfort level and I reiterated the issue of patient confidentiality.  The patient was having a great deal of difficulty coping with the sudden change in overall status.  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  Patient has struggled with how information regarding medical status should be communicated to friends and business community overall the social media information release which has been very stressful for the patient.  The patient has been to some degree catastrophizing and identifying acute levels of deficits assuming that all of motor deficits and other symptoms will be permanent at this level.  The patient has been making significant improvements with motor functions but continues to have considerable deficits.  On the other hand, there are legitimate concerns about recurrence of this tumor and significant risk of these types of tumors impacting overall life span globally.   The patient is having to deal with both acute risk as well as long-term concerns regarding diagnosis and treatment.  Concerns about how radiation therapies, if they are conducted, will complicate recovery and to what degree residual deficits will be there going forward.  06/16/2019:  Upon entering room, patient was in bed patient listening to music they found inspirational.  In positive mood state and described positive improvements and gains being made in therapy.  Alert and oriented.  Motivated to continue with therapies and continue to make functional gains.  Still considerable concerns, appropriately so, about how next steps in medical care will be timed and impact other parts of B's life, including how it will impact patient's company.  Worked on coping and dealing with complicated hospital/medical course.  Disposition/Plan:  Will follow-up with patient next week.        Electronically Signed   _______________________ Ilean Skill, Psy.D.

## 2019-06-16 NOTE — Progress Notes (Signed)
Patient ID: Jacob Lambert, adult   DOB: [birth year], 34 y.o.   MRN: [id number]   SW ordered w/c with [employer] via parachute. Unable to order RW as diagnosis not able to support item. SW to discuss with pt item will need to be purchased.  *SW met with pt and informed they will need to purchase above item.   Loralee Pacas, MSW, [place] Office: [phone] Cell: [phone] Fax: [phone]

## 2019-06-16 NOTE — Progress Notes (Signed)
Bowel program performed after dinner and dig stim performed. [place]

## 2019-06-16 NOTE — Progress Notes (Signed)
Physical Therapy Weekly Progress Note  Patient Details  Name: Jacob Lambert MRN: [id number] Date of Birth: Dec 01, 1985  Beginning of progress report period: June 09, 2019 End of progress report period: June 23, 2019  Today's Date: 06/16/2019 PT Individual Time:  - 800-900   Total treatment time: 60 min  Patient has met 3 of 3 short term goals.  New STGs established w/patient input.    Patient continues to demonstrate the following deficits muscle weakness, decreased cardiorespiratoy endurance, impaired timing and sequencing, abnormal tone, unbalanced muscle activation and decreased coordination and decreased standing balance, decreased postural control and decreased balance strategies and therefore will continue to benefit from skilled PT intervention to increase functional independence with mobility.  Patient progressing toward long term goals..  Continue plan of care.  PT Short Term Goals Week 1:  PT Short Term Goal 1 (Week 1): Pt will complete transfers with Supervision and LRAD PT Short Term Goal 1 - Progress (Week 1): Met PT Short Term Goal 2 (Week 1): Pt will ambulate x 150 ft with RW at Supervision level PT Short Term Goal 2 - Progress (Week 1): Met PT Short Term Goal 3 (Week 1): Pt will initiate stair training as safe and able PT Short Term Goal 3 - Progress (Week 1): Met Week 2:  PT Short Term Goal 1 (Week 2): mod I gait x 264f w/LRAD PT Short Term Goal 2 (Week 2): mod I bed to wc w/LRAD PT Short Term Goal 3 (Week 2): Pt will propel wc on outdoor surfaces w/supervision PT Short Term Goal 4 (Week 2): Gait 1562fw/min assist w/straight cane or no AD w/min assist Week 3:     Skilled Therapeutic Interventions/Progress Updates:  Pt initially supine and requesting clothing, changes upper/lower body clothing Independently from bed level. Assist for ted hose only. Supine to sit w/supervision, bed flat.  donns shoes independently in sitting while discussing care w/MD.  Pt requires  redirection during session due to being somewhat hyperverbal/expressive by nature. Very pleasant to work with.   Pt STS and short distance gait w/RW, turn/sit to wc w/close supervision. Pt propels wc mod I to main gym.   STS and Gait 15051f/RW w/supervision, L tibial varum, mild pronation, and VMO wasting  may contribute to sense of patellar instability.   Gait 58f37fo AD and min assist using handrail w/sig increase in gait deviations and very narrow base of support, increased UE tone into flexor pattern tendency but able to relax w/verbal cues/mult times needed to promote armswing. Mild buckling w/self recovery Rknee>L occurs mult times. wc propulsion x 100ft17filat LEs for hamstring strengthening  In parallel bars minisquats w/OTB resistance to end range knee extension and mirror for feedback for VMO strengthening/isolation 2 x 10. Pt handed over to OT in main gym for next session.   Therapy Documentation Precautions:  Precautions Precautions: Fall Precaution Booklet Issued: No Precaution Comments: watch BP and HR Restrictions Weight Bearing Restrictions: No  Therapy/Group: Individual Therapy BarbaCallie Fielding BRawson/2021, 8:34 AM

## 2019-06-16 NOTE — Progress Notes (Signed)
Occupational Therapy Session Note  Patient Details  Name: Jacob Lambert MRN: [id number] Date of Birth: February 05, 1985  Today's Date: 06/16/2019 OT Individual Time: 9924-2683 OT Individual Time Calculation (min): 57 min   Short Term Goals: Week 2:  OT Short Term Goal 1 (Week 2): Pt will transfer wiht S overall consistently with LRAD to toilet OT Short Term Goal 2 (Week 2): Pt will step over shower edge wiht CGA using LRAD OT Short Term Goal 3 (Week 2): Pt will complete LB dressing with S:     Skilled Therapeutic Interventions/Progress Updates:    Pt greeted in gym via PT handoff. Premedicated for pain and agreeable to session, wanted to start by self propelling w/c to their room and brushing their teeth. While pt was seated at the sink, discussed their goals of the session. Pt wanted to continue working on their standing balance, opting to wait until later OT session for shower. Already toileted before OT arrival. They reported loving to dance. Pt then self propelled to the dayroom where OT set up wii Just dance. Worked on UE/LE coordination, standing balance, and postural control by engaging in wii dancing while seated and standing. Pt required Min-Mod balance assist while standing without UE support on RW. Increased difficultly with coordinating LE movements compared to UE movements. Note that they had 1 anterior LOB and corrected balance using RW to steady with Min A. Also noted Lt LE offloading and increased knee flexion bilaterally with increased fatigue. Pt also engaged in dancing while seated with vcs to decrease reliance on back support of w/c for core strengthening. Close supervision for dynamic sitting with all extremities engaged in simultaneous movement. Pt very encouraged that they were able to dance as they thought initially that this was a meaningful activity that they would have to give up due to dx. At end of session pt self propelled back to the room and completed a lateral scoot transfer  with vcs and close supervision. Left pt in bed with all needs within reach, bed alarm set, and a peace aromatherapy blend to promote relaxation placed inside pillowcase. Pt appreciative.    Therapy Documentation Precautions:  Precautions Precautions: Fall Precaution Booklet Issued: No Precaution Comments: watch BP and HR Restrictions Weight Bearing Restrictions: No ADL: ADL Eating: Supervision/safety Grooming: Minimal assistance Where Assessed-Grooming: Sitting at sink Upper Body Bathing: Supervision/safety Where Assessed-Lower Body Bathing: Edge of bed Upper Body Dressing: Maximal assistance (shoes only)     Therapy/Group: Individual Therapy  Kynedi Profitt A Chelcey Caputo 06/16/2019, 12:39 PM

## 2019-06-17 ENCOUNTER — Inpatient Hospital Stay (HOSPITAL_COMMUNITY): Payer: BC Managed Care – PPO

## 2019-06-17 DIAGNOSIS — G253 Myoclonus: Secondary | ICD-10-CM

## 2019-06-17 NOTE — Progress Notes (Addendum)
[place] PHYSICAL MEDICINE & REHABILITATION PROGRESS NOTE   Subjective/Complaints:  Overall happy with progress. Concerned about ongoing weakness in left hand as it's  dominant hand. Still having spasms at times in legs, R>L.   ROS: Patient denies fever, rash, sore throat, blurred vision, nausea, vomiting, diarrhea, cough, shortness of breath or chest pain,   headache, or mood change.    Objective:   No results found. No results for input(s): WBC, HGB, HCT, PLT in the last 72 hours. No results for input(s): NA, K, CL, CO2, GLUCOSE, BUN, CREATININE, CALCIUM in the last 72 hours.  Intake/Output Summary (Last 24 hours) at 06/17/2019 1056 Last data filed at 06/17/2019 0900 Gross per 24 hour  Intake 480 ml  Output 1425 ml  Net -945 ml     Physical Exam: Vital Signs Blood pressure 103/70, pulse 80, temperature 98.1 F (36.7 C), resp. rate 17, height 5' 10.5" (1.791 m), weight 70.6 kg, SpO2 98 %. Constitutional: No distress . Vital signs reviewed. HEENT: EOMI, oral membranes moist Neck: supple Cardiovascular: RRR without murmur. No JVD    Respiratory/Chest: CTA Bilaterally without wheezes or rales. Normal effort    GI/Abdomen: BS +, non-tender, non-distended Ext: no clubbing, cyanosis, or edema Psych: pleasant and cooperative Skin: Warm and dry.  Intact. Musc: No edema in extremities.  No tenderness in extremities. Neuro: Alert Facial muscles intact Motor: Right upper extremity: Grossly 4+-5/5 proximal distal Left upper extremity: 4 -/5 proximal distal--stable Right lower extremity: Hip flexion, knee extension, ankle dorsiflexion 4 -/5 proximal distal Left lower extremity: Hip flexion, knee extension 3 /5, ankle dorsiflexion 3+ to 4-/5 Sustained clonus RLE, 3-4 beats LLE   Assessment/Plan: 1. 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  Physiatrist is providing close team supervision and 24 hour management of active medical problems listed below.  Physiatrist and rehab team continue to assess barriers to discharge/monitor patient progress toward functional and medical goals  Care Tool:  Bathing    Body parts bathed by patient: Right arm, Left arm, Chest, Abdomen, Front perineal area, Right upper leg, Left upper leg, Right lower leg, Left lower leg, Face   Body parts bathed by helper: Buttocks     Bathing assist Assist Level: Minimal Assistance - Patient > 75%     Upper Body Dressing/Undressing Upper body dressing   What is the patient wearing?: Pull over shirt    Upper body assist Assist Level: Set up assist    Lower Body Dressing/Undressing Lower body dressing      What is the patient wearing?: Skirt, Underwear/pull up     Lower body assist Assist for lower body dressing: Contact Guard/Touching assist     Toileting Toileting    Toileting assist Assist for toileting: Dependent - Patient 0% Assistive Device Comment: urinal   Transfers Chair/bed transfer  Transfers assist     Chair/bed transfer assist level: Supervision/Verbal cueing     Locomotion Ambulation   Ambulation assist      Assist level: Contact Guard/Touching assist Assistive device: Walker-rolling Max distance: 150   Walk 10 feet activity   Assist     Assist level: Supervision/Verbal cueing Assistive device: Walker-rolling   Walk 50 feet activity   Assist    Assist level: Supervision/Verbal cueing Assistive device: Walker-rolling    Walk 150 feet activity   Assist    Assist level: Contact Guard/Touching assist  Assistive device: Walker-rolling    Walk 10 feet on uneven surface  activity   Assist Walk 10 feet on uneven surfaces activity did not occur: Safety/medical concerns         Wheelchair     Assist Will patient use wheelchair at discharge?: Yes Type of Wheelchair: Manual     Wheelchair assist level: Independent Max wheelchair distance: >150    Wheelchair 50 feet with 2 turns activity    Assist        Assist Level: Independent   Wheelchair 150 feet activity     Assist      Assist Level: Independent   Blood pressure 103/70, pulse 80, temperature 98.1 F (36.7 C), resp. rate 17, height 5' 10.5" (1.791 m), weight 70.6 kg, SpO2 98 %.  Medical Problem List and Plan: 1.  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  Status post C6-7 laminoplasty resection of spinal cord tumor 06/01/2019.  Decadron taper completed  Continue CIR 2.  Antithrombotics: -DVT/anticoagulation: Lovenox.  Will need for 2-3 months depending on walking ability  Vascular study negative for DVT             -antiplatelet therapy: N/A 3. Pain Management: Neurontin 600 mg 3 times daily, Lidoderm patch as directed, Valium 10 mg every 6 hours as needed muscle spasms, Robaxin 750 mg every 6 hours as needed muscle spasms, oxycodone as needed  Topamax 25 daily started on 6/6 for migraines per patient request  6/8- change to QHS so doesn't sedate pt. Change lidoderm to 8am to 8pm- changed.   6/12 pain seems controlled  4. Mood: - don't think pt needs meds- will add if needed  6/9- will add Celexa 20 mg daily for affect/mood  6/12- no complaints of side effects, pt very appreciative that it was started             -antipsychotic agents: N/A 5. Neuropsych: This patient is capable of making decisions on their own behalf. 6. Skin/Wound Care: Routine skin checks 7. Fluids/Electrolytes/Nutrition: Routine in and outs  BMP within acceptable range on 6/4  6/8- looks good 6/7- no issues 8.  Neurogenic bowel.    Added suppository nightly and digital stimulation of rectum to improve constipation and bowel movements- if recovers ability to have BMs on their own, will stop.   6/10- <1 hour til results after bowel  program last night- better   6/12- BM <1 hr after suppository- patterns improving, stool somewhat formed 9. Minimal PVRs- will not reorder PVRs- voiding OK-  10. Spasticity- explained it's normal that it gets worse over time- is very mild right now- will monitor  6/7- on valium 10 mg q6 hours prn for spasms  6/11 baclofen added, 5mg  tid  6/12 discussed at length with pt today. Has definite clonus on exam. Consider increasing baclofen tomorrow depending upon how today goes 11. Leukocytosis  WBCs 13.8 on 6/4, labs ordered for tomorrow  6/7- WBC down to 11.7k- doing well  Afebrile 12.  Transaminitis  LFTs elevated on 6/4, labs ordered for tomorrow  6/7- actually slightly higher- 58/195- from 48/188- not on any meds that would cause it- will follow 13. Tachycardia  6/7- Will add metoprolol 12.5 mg BID for fast heart rate  6/9- Heart rate ~ 100-  6/10- heart rate ~95 -doing well with no orthostatic hypotension.      Greater than 35 total minutes was spent in examination of  patient, assessment of pertinent data,  formulation of a treatment plan, and in discussion with patient and/or family. Specifically we discussed his overall prognosis, bowel program, spasticity/clonus, mood, erectile dysfunction.     LOS: 9 days A FACE TO FACE EVALUATION WAS PERFORMED  Meredith Staggers 06/17/2019, 10:56 AM
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
## 2019-06-18 DIAGNOSIS — R252 Cramp and spasm: Secondary | ICD-10-CM

## 2019-06-18 MED ORDER — BACLOFEN 10 MG PO TABS
10.0000 mg | ORAL_TABLET | Freq: Three times a day (TID) | ORAL | Status: DC
  Administered 2019-06-18 – 2019-06-25 (×22): 10 mg via ORAL
  Filled 2019-06-18 (×22): qty 1

## 2019-06-18 NOTE — Progress Notes (Addendum)
MEWS score YELLOW d/t increased HR of 111 bpm. {t was transferred from chair to bed just prior to VS taken. This nurse assessed pt and rechecked VS. HR of 91. Pt assessed no sob, no c/o pain, no s/s of distress. Will continue to monitor.  MEWS score returned to GREEN after VS manually rechecked.

## 2019-06-18 NOTE — Progress Notes (Addendum)
[place] PHYSICAL MEDICINE & REHABILITATION PROGRESS NOTE   Subjective/Complaints:  Had a near syncopal episode after moving bowels last night. Sat back down on the toilet and symptoms resolved. Admitted to bearing down more with this bm than previous ones. Still having a lot of spasms in legs.  ROS: Patient denies fever, rash, sore throat, blurred vision, nausea, vomiting, diarrhea, cough, shortness of breath or chest pain, joint or back pain, headache, or mood change.   Objective:   No results found. No results for input(s): WBC, HGB, HCT, PLT in the last 72 hours. No results for input(s): NA, K, CL, CO2, GLUCOSE, BUN, CREATININE, CALCIUM in the last 72 hours.  Intake/Output Summary (Last 24 hours) at 06/18/2019 0948 Last data filed at 06/18/2019 0901 Gross per 24 hour  Intake 780 ml  Output 500 ml  Net 280 ml     Physical Exam: Vital Signs Blood pressure 106/72, pulse 73, temperature 98.2 F (36.8 C), resp. rate 18, height 5' 10.5" (1.791 m), weight 70.6 kg, SpO2 100 %. Constitutional: No distress . Vital signs reviewed. HEENT: EOMI, oral membranes moist Neck: supple Cardiovascular: RRR without murmur. No JVD    Respiratory/Chest: CTA Bilaterally without wheezes or rales. Normal effort    GI/Abdomen: BS +, non-tender, non-distended Ext: no clubbing, cyanosis, or edema Psych: pleasant and cooperative Skin: Warm and dry.  Intact. Musc: No edema in extremities.  No tenderness in extremities. Neuro: Alert Facial muscles intact Motor: Right upper extremity: Grossly 4+-5/5 proximal distal Left upper extremity: 4 -/5 proximal distal--stable Right lower extremity: Hip flexion, knee extension, ankle dorsiflexion 4 -/5 proximal distal Left lower extremity: Hip flexion, knee extension 3 /5, ankle dorsiflexion 3+ to 4-/5 Sustained clonus RLE, 2-3 beats LLE   Assessment/Plan: 1. 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  Physiatrist is providing close team supervision and 24 hour management of active medical problems listed below.  Physiatrist and rehab team continue to assess barriers to discharge/monitor patient progress toward functional and medical goals  Care Tool:  Bathing    Body parts bathed by patient: Right arm, Left arm, Chest, Abdomen, Front perineal area, Right upper leg, Left upper leg, Right lower leg, Left lower leg, Face   Body parts bathed by helper: Buttocks     Bathing assist Assist Level: Minimal Assistance - Patient > 75%     Upper Body Dressing/Undressing Upper body dressing   What is the patient wearing?: Pull over shirt    Upper body assist Assist Level: Set up assist    Lower Body Dressing/Undressing Lower body dressing      What is the patient wearing?: Skirt, Underwear/pull up     Lower body assist Assist for lower body dressing: Contact Guard/Touching assist     Toileting Toileting    Toileting assist Assist for toileting: Dependent - Patient 0% Assistive Device Comment: urinal   Transfers Chair/bed transfer  Transfers assist     Chair/bed transfer assist level: Supervision/Verbal cueing     Locomotion Ambulation   Ambulation assist      Assist level: Contact Guard/Touching assist Assistive device: Walker-rolling Max distance: 150   Walk 10 feet activity   Assist     Assist level: Supervision/Verbal cueing Assistive device: Walker-rolling   Walk 50 feet activity   Assist    Assist level: Supervision/Verbal cueing Assistive device: Walker-rolling    Walk  150 feet activity   Assist    Assist level: Contact Guard/Touching assist Assistive device: Walker-rolling    Walk 10 feet on uneven surface  activity   Assist Walk 10 feet on uneven surfaces activity did not occur: Safety/medical concerns          Wheelchair     Assist Will patient use wheelchair at discharge?: Yes Type of Wheelchair: Manual    Wheelchair assist level: Independent Max wheelchair distance: >150    Wheelchair 50 feet with 2 turns activity    Assist        Assist Level: Independent   Wheelchair 150 feet activity     Assist      Assist Level: Independent   Blood pressure 106/72, pulse 73, temperature 98.2 F (36.8 C), resp. rate 18, height 5' 10.5" (1.791 m), weight 70.6 kg, SpO2 100 %.  Medical Problem List and Plan: 1.  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  Status post C6-7 laminoplasty resection of spinal cord tumor 06/01/2019.  Decadron taper completed  Continue CIR 2.  Antithrombotics: -DVT/anticoagulation: Lovenox.  Will need for 2-3 months depending on walking ability  Vascular study negative for DVT             -antiplatelet therapy: N/A 3. Pain Management: Neurontin 600 mg 3 times daily, Lidoderm patch as directed, Valium 10 mg every 6 hours as needed muscle spasms, Robaxin 750 mg every 6 hours as needed muscle spasms, oxycodone as needed  Topamax 25 daily started on 6/6 for migraines per patient request  6/8- change to QHS so doesn't sedate pt. Change lidoderm to 8am to 8pm- changed.   6/13 pain seems controlled  4. Mood: - don't think pt needs meds- will add if needed  6/9- will add Celexa 20 mg daily for affect/mood  6/12- no complaints of side effects, pt very appreciative that it was started             -antipsychotic agents: N/A 5. Neuropsych: This patient is capable of making decisions on their own behalf. 6. Skin/Wound Care: Routine skin checks 7. Fluids/Electrolytes/Nutrition: Routine in and outs  BMP within acceptable range on 6/4  6/8- looks good 6/7- no issues 8.  Neurogenic bowel.    Added suppository nightly and digital stimulation of rectum to improve constipation and bowel  movements- if recovers ability to have BMs on their own, will stop.   6/10- <1 hour til results after bowel program last night- better   6/13- BM <1 hr after suppository- patterns improving, continent 9. Minimal PVRs- will not reorder PVRs- voiding OK-  10. Spasticity- explained it's normal that it gets worse over time- is very mild right now- will monitor  6/7- on valium 10 mg q6 hours prn for spasms  6/11 baclofen added, 5mg  tid  6/13 pt wants to treat spasms further. I told the patient we can increase baclofen to 10mg  TID, but we will need to watch for sedation, hypotension, etc. Pt understands and wants to try.  11. Leukocytosis  WBCs 13.8 on 6/4, labs ordered for tomorrow  6/7- WBC down to 11.7k- doing well  Afebrile 12.  Transaminitis  LFTs elevated on 6/4, labs ordered for tomorrow  6/7- actually slightly higher- 58/195- from 48/188- not on any meds that would cause it- will follow 13. Tachycardia  6/7- Will add metoprolol 12.5 mg BID for fast heart rate  6/9- Heart rate ~ 100-  6/13 HR in 70's to 80's. Given #14 and that we're increasing baclofen, I'll hold metoprolol for now    14. Presyncopal episode after bm  -likely vasovagal along with myelopathy, med effects etc  -discussed the importance of sitting down if experiences symptoms  -avoid excessive valsalva with emptying  -adequate fluid intake  -further med adjustments if needed.        LOS: 10 days A FACE TO FACE EVALUATION WAS PERFORMED  Meredith Staggers 06/18/2019, 9:48 AM

## 2019-06-19 ENCOUNTER — Inpatient Hospital Stay (HOSPITAL_COMMUNITY): Payer: BC Managed Care – PPO | Admitting: Physical Therapy

## 2019-06-19 ENCOUNTER — Inpatient Hospital Stay (HOSPITAL_COMMUNITY): Payer: BC Managed Care – PPO | Admitting: Occupational Therapy

## 2019-06-19 NOTE — Progress Notes (Signed)
[place] PHYSICAL MEDICINE & REHABILITATION PROGRESS NOTE   Subjective/Complaints:  They hadn't done dig stim up until Saturday-pt admits they havne't been [place] for it to be done- explained HAS to do dig stim.    They increased baclofen to 10 mg TID this weekend- yesterday- slightly better? Not sure.   Pain has improved significantly.  Getting Dragon  ROS:  Pt denies SOB, abd pain, CP, N/V/C/D, and vision changes .   Objective:   No results found. No results for input(s): WBC, HGB, HCT, PLT in the last 72 hours. No results for input(s): NA, K, CL, CO2, GLUCOSE, BUN, CREATININE, CALCIUM in the last 72 hours.  Intake/Output Summary (Last 24 hours) at 06/19/2019 1513 Last data filed at 06/19/2019 1110 Gross per 24 hour  Intake 598 ml  Output 1075 ml  Net -477 ml     Physical Exam: Vital Signs Blood pressure 112/71, pulse 88, temperature 98 F (36.7 C), resp. rate 16, height 5' 10.5" (1.791 m), weight 70.6 kg, SpO2 98 %. Constitutional: No distress . Vital signs reviewed. Sitting up in manual w/c in room NAD HEENT: EOMI, oral membranes moist Neck: supple Cardiovascular: RRR  Respiratory/Chest: CTA b/l GI/Abdomen: Soft, NT, ND, (+)BS  Ext: no clubbing, cyanosis, or edema Psych: appropriate Skin: Warm and dry.  Intact. Musc: No edema in extremities.  No tenderness in extremities. Neuro: Alert Facial muscles intact Motor:  RUE- 4+/5 in deltoid, biceps, triceps, WE, grip and finger abd LUE- 4+/5 in same muscles except 3-/5 in finger abd RLE_ HF 4+/5, KE 5-/5, DF and PF 5-/5 LLE- HF 4/5, KE 5-/5, DF and PF 5-/5 4-5 beats clonus RLE , 2-3 beats LLE still   Assessment/Plan: 1. 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  Physiatrist is providing close team supervision and 24 hour management of active medical problems listed  below.  Physiatrist and rehab team continue to assess barriers to discharge/monitor patient progress toward functional and medical goals  Care Tool:  Bathing    Body parts bathed by patient: Right arm, Left arm, Chest, Abdomen, Front perineal area, Right upper leg, Left upper leg, Right lower leg, Left lower leg, Face   Body parts bathed by helper: Buttocks     Bathing assist Assist Level: Minimal Assistance - Patient > 75%     Upper Body Dressing/Undressing Upper body dressing   What is the patient wearing?: Pull over shirt    Upper body assist Assist Level: Set up assist    Lower Body Dressing/Undressing Lower body dressing      What is the patient wearing?: Skirt, Underwear/pull up     Lower body assist Assist for lower body dressing: Contact Guard/Touching assist     Toileting Toileting    Toileting assist Assist for toileting: Dependent - Patient 0% Assistive Device Comment: urinal   Transfers Chair/bed transfer  Transfers assist     Chair/bed transfer assist level: Supervision/Verbal cueing     Locomotion Ambulation   Ambulation assist      Assist level: Contact Guard/Touching assist Assistive device: Walker-rolling Max distance: 150   Walk 10 feet activity   Assist     Assist level: Supervision/Verbal cueing Assistive device: Walker-rolling   Walk 50 feet activity   Assist    Assist level: Supervision/Verbal cueing Assistive device: Walker-rolling    Walk 150 feet activity   Assist  Assist level: Contact Guard/Touching assist Assistive device: Walker-rolling    Walk 10 feet on uneven surface  activity   Assist Walk 10 feet on uneven surfaces activity did not occur: Safety/medical concerns         Wheelchair     Assist Will patient use wheelchair at discharge?: Yes Type of Wheelchair: Manual    Wheelchair assist level: Independent Max wheelchair distance: >150    Wheelchair 50 feet with 2 turns  activity    Assist        Assist Level: Independent   Wheelchair 150 feet activity     Assist      Assist Level: Independent   Blood pressure 112/71, pulse 88, temperature 98 F (36.7 C), resp. rate 16, height 5' 10.5" (1.791 m), weight 70.6 kg, SpO2 98 %.  Medical Problem List and Plan: 1.  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  Status post C6-7 laminoplasty resection of spinal cord tumor 06/01/2019.  Decadron taper completed  Continue CIR 2.  Antithrombotics: -DVT/anticoagulation: Lovenox.  Will need for 2-3 months depending on walking ability  Vascular study negative for DVT             -antiplatelet therapy: N/A 3. Pain Management: Neurontin 600 mg 3 times daily, Lidoderm patch as directed, Valium 10 mg every 6 hours as needed muscle spasms, Robaxin 750 mg every 6 hours as needed muscle spasms, oxycodone as needed  Topamax 25 daily started on 6/6 for migraines per patient request  6/8- change to QHS so doesn't sedate pt. Change lidoderm to 8am to 8pm- changed.   6/14- pain doing significantly better-  4. Mood: - don't think pt needs meds- will add if needed  6/9- will add Celexa 20 mg daily for affect/mood  6/12- no complaints of side effects, pt very appreciative that it was started             -antipsychotic agents: N/A 5. Neuropsych: This patient is capable of making decisions on their own behalf. 6. Skin/Wound Care: Routine skin checks 7. Fluids/Electrolytes/Nutrition: Routine in and outs  BMP within acceptable range on 6/4  6/8- looks good 6/7- no issues 8.  Neurogenic bowel.    Added suppository nightly and digital stimulation of rectum to improve constipation and bowel movements- if recovers ability to have BMs on their own, will stop.   6/10- <1 hour til results after bowel program last night- better   6/13- BM <1 hr after suppository- patterns improving, continent   6/14- NEEDS dig stim- explained that's why not having results fast 9. Minimal PVRs- will not reorder PVRs- voiding OK-  10. Spasticity- explained it's normal that it gets worse over time- is very mild right now- will monitor  6/7- on valium 10 mg q6 hours prn for spasms  6/11 baclofen added, 5mg  tid  6/13 pt wants to treat spasms further. I told him we can increase baclofen to 10mg  TID, but we will need to watch for sedation, hypotension, etc. Pt understands and wants to try.   6/14- no increased sedation- no side effects so far- not sure if helping yet 11. Leukocytosis  WBCs 13.8 on 6/4, labs ordered for tomorrow  6/7- WBC down to 11.7k- doing well  Afebrile 12.  Transaminitis  LFTs elevated on 6/4, labs ordered for tomorrow  6/7- actually slightly higher- 58/195- from 48/188- not on any meds that would cause it- will follow 13. Tachycardia  6/7- Will add metoprolol 12.5 mg BID for fast heart rate  6/9- Heart rate ~ 100-  6/13 HR in 70's to 80's. Given #14 and that we're increasing baclofen, I'll hold metoprolol for now    14. Presyncopal episode after bm  -likely vasovagal along with myelopathy, med effects etc  -discussed the importance of sitting down if experiences symptoms  -avoid excessive valsalva with emptying  -adequate fluid intake  -further med adjustments if needed.        LOS: 11 days A FACE TO FACE EVALUATION WAS PERFORMED  Jacob Lambert 06/19/2019, 3:13 PM

## 2019-06-19 NOTE — Progress Notes (Signed)
Pt wants to postpone bowel program til 8ish tonight due to parents visiting and last time visited them got interrupted with bowel program. Explained that this RN would report off to nightshift RN to perform bowel program. Pt has tolerated pain well today and not required prn pain meds. Will cont to monitor.   Erie Noe, RN

## 2019-06-19 NOTE — Progress Notes (Signed)
Physical Therapy Session Note  Patient Details  Name: Jacob Lambert MRN: [id number] Date of Birth: May 23, 1985  Today's Date: 06/19/2019 PT Individual Time: 1105-1205  Short Term Goals: Week 2:  PT Short Term Goal 1 (Week 2): mod I gait x 264ft w/LRAD PT Short Term Goal 2 (Week 2): mod I bed to wc w/LRAD PT Short Term Goal 3 (Week 2): Pt will propel wc on outdoor surfaces w/supervision PT Short Term Goal 4 (Week 2): Gait 139ft w/min assist w/straight cane or no AD w/min assist  Skilled Therapeutic Interventions/Progress Updates:  Pt received supine in bed, listening to music. Agreeable to therapy. Pt does c/o mild L knee pain, but states this is "significantly less" than previous days. Thigh high TED hose in place to BLE. Pt is independent with bed mobility. Pt is independent with donning shoes seated at EOB. Performed sit <> stand with supervision throughout session. Performed squat pivot transfer with supervision throughout session. Pt is independent with W/C brake and foot rest management. Dependent W/C transfer patient room <> elevator for time and energy conservation. Pt educated in Artist, including backing into the elevator on entry to facilitate exit, as well as discharging build-up of static electricity. Dependent W/C transfer elevator <> outside the hospital for time and energy conservation. Pt independently propelled themself in a W/C at least 300 feet in a community setting, including up and down a 5-10 degree ramp x2. Reminded pt to lean forwards in the W/C when ascending ramp in order to apply more force to the hand rims as well as to avoid tipping backwards. Pt ambulated 150 feet x2 with a RW with supervision on an inclined, uneven surface to help pt learn to manage the RW in a more functional setting. Pt was able to problem-solve and navigate around small obstacles while using the RW. Pt's gait is much improved with scissoring noted <5% of the time. Pt left supine in bed with  all needs in reach. Pt given coffee. All questions answered.   Therapy Documentation Precautions:  Precautions Precautions: Fall Precaution Booklet Issued: No Precaution Comments: watch BP and HR Restrictions Weight Bearing Restrictions: No  Therapy/Group: Individual Therapy  Nadeen Landau, SPT  06/19/2019, 4:05 PM

## 2019-06-19 NOTE — Progress Notes (Signed)
Occupational Therapy Session Note  Patient Details  Name: Jacob Lambert MRN: [id number] Date of Birth: 04-01-1985  Today's Date: 06/19/2019 OT Individual Time: 0705-0800 and 2836-6294 OT Individual Time Calculation (min): 55 min and 72 mins   Short Term Goals: Week 2:  OT Short Term Goal 1 (Week 2): Pt will transfer wiht S overall consistently with LRAD to toilet OT Short Term Goal 2 (Week 2): Pt will step over shower edge wiht CGA using LRAD OT Short Term Goal 3 (Week 2): Pt will complete LB dressing with S  Skilled Therapeutic Interventions/Progress Updates:    Session 1: Upon entering the room, pt supine in bed and having just finished breakfast. Pt is agreeable to shower this session. Set up A to obtain clothing items from large bag. Pt ambulating with CGA into bathroom with RW onto TTB. Pt bathing with overall supervision this session while seated for task. OT assisted pt with washing back. Pt dressing self from TTB with CGA overall. Pt ambulating back to bed in same manner as above. Pt seated on EOB to don TEDs with use of bag and set up A. Pt returning to wheelchair and focus on L UE [place] with use of theraputty for continued exercises. Pt does have greatest difficulty with 5th and 4th digits of L hand with [facility] tasks. Pt returning to room and remained in wheelchair. Pt position at sink for grooming tasks as therapist exits the room. Call bell within reach.   Session 2: Upon entering the room, pt supine in bed and listening to music. Pt playing specific song and discussing how it has special meaning. OT providing therapeutic use of self. Pt declined toileting needs and transfers into wheelchair with supervision for lateral scoot. Pt propels self 300'+ to gift shop. Pt demonstrating community mobility skills with wheelchair by propelling in small space, turning wheelchair around, picking up items, and demonstrating how to move items from different locations with use of wheelchair. Pt taking rest  break and then ambulating with RW 400' with supervision and occasional  CGA for balance with uneven surface when pt is distracted. Pt propelled self back to room in same manner as above. Pt set up wheelchair for transfer and moving leg rests as well with increased time. Sit >supine independently. Bed alarm activated and call bell within reach.   Therapy Documentation Precautions:  Precautions Precautions: Fall Precaution Booklet Issued: No Precaution Comments: watch BP and HR Restrictions Weight Bearing Restrictions: No Vital Signs: Therapy Vitals Temp: 98 F (36.7 C) Pulse Rate: 88 Resp: 16 BP: 112/71 Patient Position (if appropriate): Lying Oxygen Therapy SpO2: 98 % O2 Device: Room Air ADL: ADL Eating: Supervision/safety Grooming: Minimal assistance Where Assessed-Grooming: Sitting at sink Upper Body Bathing: Supervision/safety Where Assessed-Lower Body Bathing: Edge of bed Upper Body Dressing: Maximal assistance (shoes only)   Therapy/Group: Individual Therapy  [name] 06/19/2019, 5:01 PM

## 2019-06-19 NOTE — Progress Notes (Signed)
Patient ID: [place], adult   DOB: 05/04/1985, 33 y.o.   MRN: [id number]   This RN Care Coordinator met with pt to discuss their nursing and therapy thus far in their stay on rehab. They are handling their "new normal' as well as to be expected," from what they expressed to this RN. They report that they have found a therapist whom they will be able to work with after discharge that will meet all of their needs, physical pain, anxiety, and depression. They are very happy and hopeful for this therapist. As this RN was leaving room therapy was entering and pt was very motivated for session.  Stacey Jennings, RN, BSN, CRRN Office: [phone] Cell: [phone]  

## 2019-06-20 ENCOUNTER — Inpatient Hospital Stay (HOSPITAL_COMMUNITY): Payer: BC Managed Care – PPO

## 2019-06-20 ENCOUNTER — Inpatient Hospital Stay (HOSPITAL_COMMUNITY): Payer: BC Managed Care – PPO | Admitting: Physical Therapy

## 2019-06-20 NOTE — Progress Notes (Signed)
Physical Therapy Session Note  Patient Details  Name: Jacob [place] MRN: [id number] Date of Birth: [birth year]  Today's Date: 06/20/2019 PT Individual Time: 0804-0906 PT Individual Time Calculation (min): 62 min   Short Term Goals: Week 2:  PT Short Term Goal 1 (Week 2): mod I gait x 219ft w/LRAD PT Short Term Goal 2 (Week 2): mod I bed to wc w/LRAD PT Short Term Goal 3 (Week 2): Pt will propel wc on outdoor surfaces w/supervision PT Short Term Goal 4 (Week 2): Gait 153ft w/min assist w/straight cane or no AD w/min assist  Skilled Therapeutic Interventions/Progress Updates:    Pt received supine in bed with RN present for morning medications while pt listening to motivational music. Pt agreeable to therapy session and does voice emotions regarding medical diagnosis and physiological changes with therapist providing emotional support during session. Donned clothes and TED hose in supine with set-up assist. Supine>sitting EOB independently - donned shoes sitting EOB without assist. Sit<>stands using RW with CGA for steadying during session. Gait training ~166ft to main therapy gym using RW with CGA for steadying and 1x min assist due to minor R LOB - narrow BOS and minor impaired coordination noted in LEs. Repeated sit<>stands with level 2 theraband around knees to promote increased hip abductor activation without UE support with CGA and a few instances of min assist due to posterior lean and fatiguing eccentric control. Lateral side stepping at hallway rail with level 2 theraband around knees and CGA for steadying using UE support on rail.  Performed the following dynamic standing balance, LE coordination and strengthening tasks:  - forward stepping on/off 4" step with HHA and min assist - noted to have impaired coordination with inconsistent placement of feet on the step and not fully stepping foot backwards off the step  - lateral side stepping on/off 4" step with HHA and min assist for balance - pt  with more difficulty stepping up L (as opposed to R) due to impaired coordination and decreased lateral step length - repeated cuing for increased hip/knee extension as pt has progressing flexed posture into squat position due to LE fatigue Gait training ~171ft back to room using RW with 1x seated rest break on low couch - pt able to come to stand from that low surface with only CGA. Sit>supine independently. Pt left supine in bed with needs in reach and bed alarm on.  Therapy Documentation Precautions:  Precautions Precautions: Fall Precaution Booklet Issued: No Precaution Comments: watch BP and HR Restrictions Weight Bearing Restrictions: Yes  Pain:   Pt receiving medications at beginning of session including lidocaine patch - no complaints of pain during session.    Therapy/Group: Individual Therapy  Tawana Scale, PT, DPT 06/20/2019, 7:49 AM

## 2019-06-20 NOTE — Progress Notes (Signed)
Occupational Therapy Session Note  Patient Details  Name: Jacob Lambert MRN: [id number] Date of Birth: 23-Feb-1985  Today's Date: 06/20/2019 OT Individual Time: 0915-1000 OT Individual Time Calculation (min): 45 min    Short Term Goals: Week 2:  OT Short Term Goal 1 (Week 2): Pt will transfer wiht S overall consistently with LRAD to toilet OT Short Term Goal 2 (Week 2): Pt will step over shower edge wiht CGA using LRAD OT Short Term Goal 3 (Week 2): Pt will complete LB dressing with S  Skilled Therapeutic Interventions/Progress Updates:    Pt resting in bed upon arrival and agreeable to participating in therapy.  OT intervention with focus on functional tranfsers, w/c mobility, standing balance, scanning strategies, ongoing discharge planning, and safety awareness to increase independence with BADLs.  Bed mobility and squat pivot transfer to w/c with supervision.  Pt propelled w/c to small gym and initially engaged in [employer] activities x 2 in standing. Standing balance with CGA and no LOB noted. Initial task with red lights alone and overall reaction time of 1.19 seconds.  Second trial with red and green lights.  Pt challenged to use RUE for green lights and LUE for red lights.  Task required pt rotate trunk to reach across midline to push buttons.  Overall reaction time 1.34 seconds. Pt commented that they could feel their legs getting weak near the end of the second trial.  Discussed use of w/c for community outings and RW or [facility] for amb in home setting.  Pt concerned with their ability to use LUE for handwriting tasks.  Pt also commented that they will be using Dragon for computer tasks. Pt propelled back to room and transferred to bed.  Pt remained in bed with all needs within reach and bed alarm activated.   Therapy Documentation Precautions:  Precautions Precautions: Fall Precaution Booklet Issued: No Precaution Comments: watch BP and HR Restrictions Weight Bearing Restrictions:  Yes  Pain: Pt c/o ongoing pain in 3rd digit of L hand; meds admin prior to therapy.    Therapy/Group: Individual Therapy  [name] 06/20/2019, 10:54 AM

## 2019-06-20 NOTE — Progress Notes (Signed)
Physical Therapy Session Note  Patient Details  Name: Jacob Lambert MRN: [id number] Date of Birth: 1985/06/26  Today's Date: 06/20/2019 PT Individual Time: 1300-1400  Short Term Goals: Week 2:  PT Short Term Goal 1 (Week 2): mod I gait x 22ft w/LRAD PT Short Term Goal 2 (Week 2): mod I bed to wc w/LRAD PT Short Term Goal 3 (Week 2): Pt will propel wc on outdoor surfaces w/supervision PT Short Term Goal 4 (Week 2): Gait 164ft w/min assist w/straight cane or no AD w/min assist  Skilled Therapeutic Interventions/Progress Updates: Pt received supine in bed. Agreeable to therapy. Denies any pain. Pt is independent with bed mobility. Shoes donned by therapist for time conservation. Sit <> stand performed with RW and supervision throughout session. Ambulated 100 feet to therapy gym with RW and CGA. Facing forwards, pt went up and down a total of 8 six-inch stairs with one hand on the L rail (to mimic parent's house) with min A. Verbal cues required for correct foot placement. Because pt c/o L knee pain while performing this, switched to having pt perform stairs laterally. Pt sidestepped up and down a total of 8 six-inch stairs with both hands on the L rail and CGA. Verbal cues required for correct foot placement. Pt reports L knee pain markedly improved with sidestepping. Dependent W/C transfer to rehab apartment for functional training. Pt practiced transfers standing with RW <> sitting EOB with supervision for a total of three trials. Pt mentioned they may use a small step to assist transfer into bed at home. Practiced having pt back up to step with RW, stand on step, and then sit down on EOB. Due to their home bed height of 25+ inches, informed pt that W/C transfers to bed are unsafe, and pt is agreeable. Pt independently propelled themself 100 feet from rehab apartment to room with W/C. Squat pivot transfer from W/C to bed with supervision. Pt left supine in bed with all needs in reach. Pt given ice cream.  All questions answered.  Therapy Documentation Precautions:  Precautions Precautions: Fall Precaution Booklet Issued: No Precaution Comments: watch BP and HR Restrictions Weight Bearing Restrictions: Yes  Therapy/Group: Individual Therapy  Nadeen Landau, SPT  06/20/2019, 4:41 PM

## 2019-06-20 NOTE — Patient Care Conference (Cosign Needed)
Inpatient RehabilitationTeam Conference and Plan of Care Update Date: 06/20/2019   Time: 6:47 AM    Patient Name: Jacob Lambert      Medical Record Number: [id number]  Date of Birth: [birth year] Sex: Adult         Room/Bed: 4W11C/4W11C-01 Payor Info: Payor: BLUE CROSS BLUE [employer]IELD / Plan: BCBS COMM PPO / Product Type: *No Product type* /    Admit Date/Time:  06/08/2019  4:37 PM  Primary Diagnosis:  Quadriplegia and quadriparesis ([place])  Patient Active Problem List   Diagnosis Date Noted  . Pain   . Transaminitis   . Leukocytosis   . Neurogenic bowel   . Postoperative pain   . 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 Quadriplegia and quadriparesis ([place])   . 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 Numbness 05/10/2019  . Weakness of left lower extremity 05/10/2019  . 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 Visual disturbance 05/10/2019    Expected Discharge Date: Expected Discharge Date: 06/28/19  Team Members Present:       Current Status/Progress Goal Weekly Team Focus  Bowel/Bladder     continent b+b; bowel program. Last bm with suppository 06/20/19.        Swallow/Nutrition/ Hydration             ADL's   functiona transfers/ambulation with RW-supervision; bathing-supervision; standing balance-supervision  mod I overall  educaiton, transfers, activity toelrane, safety awareness   Mobility   Independent with bed mobility, supervision for transfers, ambulation with RW and supervision, independent with W/C  Mod I overall with supervision for stairs  Ambulation with RW and using W/C in community setting, LE coordination   Communication             Safety/Cognition/ Behavioral Observations            Pain     c/o pain with activity, relieved with prn and sched oxycodone        Skin     No skin issues. Clean dry intact with exception of surgical incision.         Rehab Goals Patient on target to meet rehab goals: Yes *See Care Plan and progress notes for long and short-term goals.     Barriers to  Discharge  Current Status/Progress Possible Resolutions Date Resolved   Nursing                  PT  Medical stability;Incontinence;Pending chemo/radiation                 OT                  SLP                Care Coordinator Decreased caregiver support;Lack of/limited family support              Discharge Planning/Teaching Needs:  D/c to home with parents with 24/7 care. Both parents continue to work; work schedules create a balance for pt to have support needed.  Family education as recommended   Team Discussion:  ###  Revisions to Treatment Plan:  ###    Medical Summary        ##         I attest that I was present, lead the team conference, and concur with the assessment and plan of the team.   Curt Bears 06/20/2019, 6:47 AM

## 2019-06-20 NOTE — Progress Notes (Signed)
[facility] PHYSICAL MEDICINE & REHABILITATION PROGRESS NOTE   Subjective/Complaints:  No complaints this morning.  Getting Dragon as still has left hand weakness. Feels improvements in spasticity with Baclofen.  Has been very happy with therapy.  ROS:  Pt denies SOB, abd pain, CP, N/V/C/D, and vision changes  Objective:   No results found. No results for input(s): WBC, HGB, HCT, PLT in the last 72 hours. No results for input(s): NA, K, CL, CO2, GLUCOSE, BUN, CREATININE, CALCIUM in the last 72 hours.  Intake/Output Summary (Last 24 hours) at 06/20/2019 1103 Last data filed at 06/20/2019 0900 Gross per 24 hour  Intake 236 ml  Output 1350 ml  Net -1114 ml     Physical Exam: Vital Signs Blood pressure 122/80, pulse 61, temperature (!) 97.5 F (36.4 C), temperature source Oral, resp. rate 16, height 5' 10.5" (1.791 m), weight 70.6 kg, SpO2 100 %. Constitutional: No distress . Vital signs reviewed. Sitting up in manual w/c in room NAD HEENT: EOMI, oral membranes moist Neck: supple Cardiovascular: RRR  Respiratory/Chest: CTA b/l GI/Abdomen: Soft, NT, ND, (+)BS  Ext: no clubbing, cyanosis, or edema Psych: appropriate Skin: Warm and dry.  Intact. Musc: No edema in extremities.  No tenderness in extremities. Neuro: Alert Facial muscles intact Motor:  RUE- 4+/5 in deltoid, biceps, triceps, WE, grip and finger abd LUE- 4+/5 in same muscles except 3-/5 in finger abd RLE_ HF 4+/5, KE 5-/5, DF and PF 5-/5 LLE- HF 4/5, KE 5-/5, DF and PF 5-/5 4-5 beats clonus RLE , 2-3 beats LLE still  Assessment/Plan: 1. 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  Physiatrist is providing close team supervision and 24 hour management of active medical problems listed below.  Physiatrist and rehab team continue to assess barriers to discharge/monitor  patient progress toward functional and medical goals  Care Tool:  Bathing    Body parts bathed by patient: Right arm, Left arm, Chest, Abdomen, Front perineal area, Right upper leg, Left upper leg, Right lower leg, Left lower leg, Face, Buttocks   Body parts bathed by helper: Buttocks     Bathing assist Assist Level: Contact Guard/Touching assist     Upper Body Dressing/Undressing Upper body dressing   What is the patient wearing?: Dress    Upper body assist Assist Level: Set up assist    Lower Body Dressing/Undressing Lower body dressing      What is the patient wearing?: Pants     Lower body assist Assist for lower body dressing: Contact Guard/Touching assist     Toileting Toileting    Toileting assist Assist for toileting: Dependent - Patient 0% Assistive Device Comment: urinal   Transfers Chair/bed transfer  Transfers assist     Chair/bed transfer assist level: Supervision/Verbal cueing     Locomotion Ambulation   Ambulation assist      Assist level: Contact Guard/Touching assist Assistive device: Walker-rolling Max distance: 500'   Walk 10 feet activity   Assist     Assist level: Supervision/Verbal cueing Assistive device: Walker-rolling   Walk 50 feet activity   Assist    Assist level: Supervision/Verbal cueing Assistive device: Walker-rolling    Walk 150 feet activity   Assist    Assist level: Supervision/Verbal cueing Assistive device: Walker-rolling    Walk 10 feet on uneven surface  activity   Assist Walk 10 feet on uneven surfaces activity  did not occur: Safety/medical concerns   Assist level: Contact Guard/Touching assist Assistive device: Aeronautical engineer Will patient use wheelchair at discharge?: Yes Type of Wheelchair: Manual    Wheelchair assist level: Independent Max wheelchair distance: > 300 feet    Wheelchair 50 feet with 2 turns activity    Assist         Assist Level: Independent   Wheelchair 150 feet activity     Assist      Assist Level: Independent, Supervision/Verbal cueing   Blood pressure 122/80, pulse 61, temperature (!) 97.5 F (36.4 C), temperature source Oral, resp. rate 16, height 5' 10.5" (1.791 m), weight 70.6 kg, SpO2 100 %.  Medical Problem List and Plan: 1.  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  Status post C6-7 laminoplasty resection of spinal cord tumor 06/01/2019.  Decadron taper completed  Continue CIR  Team conference today.  2.  Antithrombotics: -DVT/anticoagulation: Lovenox.  Will need for 2-3 months depending on walking ability  Vascular study negative for DVT             -antiplatelet therapy: N/A 3. Pain Management: Neurontin 600 mg 3 times daily, Lidoderm patch as directed, Valium 10 mg every 6 hours as needed muscle spasms, Robaxin 750 mg every 6 hours as needed muscle spasms, oxycodone as needed  Topamax 25 daily started on 6/6 for migraines per patient request  6/8- change to QHS so doesn't sedate pt. Change lidoderm to 8am to 8pm- changed.   6/15- pain is improved.  4. Mood: - don't think pt needs meds- will add if needed  6/9- will add Celexa 20 mg daily for affect/mood  6/12- no complaints of side effects, pt very appreciative that it was started             -antipsychotic agents: N/A 5. Neuropsych: This patient is capable of making decisions on their own behalf. 6. Skin/Wound Care: Routine skin checks 7. Fluids/Electrolytes/Nutrition: Routine in and outs  BMP within acceptable range on 6/4  6/8- looks good 6/7- no issues 8.  Neurogenic bowel.    Added suppository nightly and digital stimulation of rectum to improve constipation and bowel movements- if recovers ability to have BMs on their own, will stop.   6/10- <1 hour til results after bowel program last night- better   6/13- BM <1 hr after  suppository- patterns improving, continent  6/14- NEEDS dig stim- explained that's why not having results fast 9. Minimal PVRs- will not reorder PVRs- voiding OK-  10. Spasticity- explained it's normal that it gets worse over time- is very mild right now- will monitor  6/7- on valium 10 mg q6 hours prn for spasms  6/11 baclofen added, 5mg  tid  6/13 pt wants to treat spasms further. I told them we can increase baclofen to 10mg  TID, but we will need to watch for sedation, hypotension, etc. Pt understands and wants to try.   6/14- no increased sedation- no side effects so far- not sure if helping yet  6/15: Patient says they are experiencing benefit without sedation. 11. Leukocytosis  WBCs 13.8 on 6/4, labs ordered for tomorrow  6/7- WBC down to 11.7k- doing well  Afebrile 12.  Transaminitis  LFTs elevated on 6/4, labs ordered for tomorrow  6/7- actually slightly higher- 58/195- from 48/188- not on any meds that would cause it- will follow 13. Tachycardia  6/7- Will  add metoprolol 12.5 mg BID for fast heart rate  6/9- Heart rate ~ 100-  6/13 HR in 70's to 80's. Given #14 and that we're increasing baclofen, I'll hold metoprolol for now    6/15: Well controlled  14. Presyncopal episode after bm  -likely vasovagal along with myelopathy, med effects etc  -discussed the importance of sitting down if experiences symptoms  -avoid excessive valsalva with emptying  -adequate fluid intake  -further med adjustments if needed.   -6/15: no more similar episodes.   LOS: 12 days A FACE TO FACE EVALUATION WAS PERFORMED  Kaydense Rizo P Ines Rebel 06/20/2019, 11:03 AM

## 2019-06-20 NOTE — Plan of Care (Signed)
  Problem: Consults Goal: RH SPINAL CORD INJURY PATIENT EDUCATION Description:  See Patient Education module for education specifics.  Outcome: Progressing   Problem: SCI BOWEL ELIMINATION Goal: RH STG MANAGE BOWEL WITH ASSISTANCE Description: STG Manage Bowel with min Assistance. Outcome: Progressing Goal: RH STG MANAGE BOWEL W/EQUIPMENT W/ASSISTANCE Description: STG Manage Bowel With Equipment With min Assistance Outcome: Progressing Goal: RH STG SCI MANAGE BOWEL PROGRAM W/ASSIST OR AS APPROPRIATE Description: STG SCI Manage bowel program w/min assist or as appropriate. Outcome: Progressing   Problem: SCI BLADDER ELIMINATION Goal: RH STG MANAGE BLADDER WITH EQUIPMENT WITH ASSISTANCE Description: STG Manage Bladder With Equipment With mod I Assistance Outcome: Progressing   Problem: RH SKIN INTEGRITY Goal: RH STG SKIN FREE OF INFECTION/BREAKDOWN Outcome: Progressing Goal: RH STG MAINTAIN SKIN INTEGRITY WITH ASSISTANCE Description: STG Maintain Skin Integrity With min Assistance. Outcome: Progressing   Problem: RH SAFETY Goal: RH STG ADHERE TO SAFETY PRECAUTIONS W/ASSISTANCE/DEVICE Description: STG Adhere to Safety Precautions With reminders each shift Outcome: Progressing   Problem: RH PAIN MANAGEMENT Goal: RH STG PAIN MANAGED AT OR BELOW PT'S PAIN GOAL Description: STG pt's pain level less than 4 on scale of 1-10 Outcome: Progressing   Problem: RH KNOWLEDGE DEFICIT SCI Goal: RH STG INCREASE KNOWLEDGE OF SELF CARE AFTER SCI Description: Pt. will understand/demonstrate management of bowel program with minimal assistance.  Pt. will demonstrate understanding of pain management techniques  Pt. will verbalize understanding of prescribed medications Outcome: Progressing

## 2019-06-20 NOTE — Progress Notes (Addendum)
Occupational Therapy Session Note  Patient Details  Name: Jacob Lambert MRN: [id number] Date of Birth: 1985/10/17  Today's Date: 06/20/2019 OT Individual Time: 1130-1200 OT Individual Time Calculation (min): 30 min    Short Term Goals: Week 2:  OT Short Term Goal 1 (Week 2): Pt will transfer wiht S overall consistently with LRAD to toilet OT Short Term Goal 2 (Week 2): Pt will step over shower edge wiht CGA using LRAD OT Short Term Goal 3 (Week 2): Pt will complete LB dressing with S  Skilled Therapeutic Interventions/Progress Updates:    OT intervention with focus on handwriting skills with RUE and LUE.  Pt LUE dominant. Pt practiced with R and L with regular pen.  Writing with RUE very labored.  Pt commented that writing with LUE aggravated pain in 3rd digit.  Pen wrapped with [place] and pt commented that pen was more comfortable.  Pt also practiced with pen with increased circumference with noted improvement with comfort.  Pt overall pleased that they will be able to use their LUE for endorsing checks and simple handwriting tasks. Provided resources for pens to purchase online. Pt remained in bed with all needs within reach and bed alarm activated.   Therapy Documentation Precautions:  Precautions Precautions: Fall Precaution Booklet Issued: No Precaution Comments: watch BP and HR Restrictions Weight Bearing Restrictions: Yes  Pain:  Pt c/o ongoing LUE 3rd digit pain; emotional support   Therapy/Group: Individual Therapy  [name] 06/20/2019, 12:13 PM

## 2019-06-20 NOTE — Patient Care Conference (Signed)
Inpatient RehabilitationTeam Conference and Plan of Care Update Date: 06/20/2019   Time: 3:40 PM    Patient Name: Jacob Lambert      Medical Record Number: [id number]  Date of Birth: November 28, 1985 Sex: Adult         Room/Bed: 4W11C/4W11C-01 Payor Info: Payor: BLUE CROSS BLUE [employer]IELD / Plan: BCBS COMM PPO / Product Type: *No Product type* /    Admit Date/Time:  06/08/2019  4:37 PM  Primary Diagnosis:  Quadriplegia and quadriparesis ([place])  Patient Active Problem List   Diagnosis Date Noted  . Pain   . Transaminitis   . Leukocytosis   . Neurogenic bowel   . Postoperative pain   . 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 Quadriplegia and quadriparesis ([place])   . 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 Numbness 05/10/2019  . Weakness of left lower extremity 05/10/2019  . 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 Visual disturbance 05/10/2019    Expected Discharge Date: Expected Discharge Date: 06/28/19  Team Members Present: Physician leading conference: Dr. Leeroy Cha Care Coodinator Present: Loralee Pacas, LCSWA;Other (comment) Dorthula Nettles, RN, BSN, CRRN) Nurse Present: Rosita Fire, RN PT Present: Excell Seltzer, PT OT Present: Roanna Epley, COTA;Jennifer Tamala Julian, OT PPS Coordinator present : Ileana Ladd, PT     Current Status/Progress Goal Weekly Team Focus  Bowel/Bladder             Swallow/Nutrition/ Hydration             ADL's   functiona transfers/ambulation with RW-supervision; bathing-supervision; standing balance-supervision  mod I overall  educaiton, transfers, activity toelrane, safety awareness   Mobility   Independent with bed mobility, supervision for transfers, ambulation with RW and supervision, independent with W/C  Mod I overall with supervision for stairs  Ambulation with RW and using W/C in community setting, LE coordination   Communication             Safety/Cognition/ Behavioral Observations            Pain             Skin              Rehab Goals Patient on  target to meet rehab goals: Yes *See Care Plan and progress notes for long and short-term goals.     Barriers to Discharge  Current Status/Progress Possible Resolutions Date Resolved   Nursing                  PT  Medical stability;Incontinence;Pending chemo/radiation                 OT                  SLP                Care Coordinator Decreased caregiver support;Lack of/limited family support              Discharge Planning/Teaching Needs:  D/c to home with parents with 24/7 care. Both parents continue to work; work schedules create a balance for pt to have support needed.  Family education as recommended   Team Discussion:  Pain medication given at night. Valium given during the day. This regimen working well for pt. Independent with WC mobility. Contact guard to Supervision for ADL's. Working on standing balance.  Revisions to Treatment Plan:  none    Medical Summary Current Status: Pain is well controlled, receiving bowel program, has depression regarding his impairments, spasticity Weekly Focus/Goal: Continue current pain regimen,  bowel program, continue neuropsych counseling and antidepressant, continue baclofen  Barriers to Discharge: Medical stability;Home enviroment access/layout;Neurogenic Bowel & Bladder  Barriers to Discharge Comments: Post-surgical pain, neurogenic bowel and bladder, situational depression, spasticity Possible Resolutions to Barriers: Continue current pain regimen, bowel program, continue neuropsych counseling and antidepressant, continue baclofen   Continued Need for Acute Rehabilitation Level of Care: The patient requires daily medical management by a physician with specialized training in physical medicine and rehabilitation for the following reasons: Direction of a multidisciplinary physical rehabilitation program to maximize functional independence : Yes Medical management of patient stability for increased activity during participation in an  intensive rehabilitation regime.: Yes Analysis of laboratory values and/or radiology reports with any subsequent need for medication adjustment and/or medical intervention. : Yes   I attest that I was present, lead the team conference, and concur with the assessment and plan of the team.   Cristi Loron 06/20/2019, 3:40 PM

## 2019-06-21 ENCOUNTER — Inpatient Hospital Stay (HOSPITAL_COMMUNITY): Payer: BC Managed Care – PPO

## 2019-06-21 ENCOUNTER — Inpatient Hospital Stay (HOSPITAL_COMMUNITY): Payer: BC Managed Care – PPO | Admitting: Physical Therapy

## 2019-06-21 MED ORDER — DICLOFENAC SODIUM 1 % EX GEL
2.0000 g | Freq: Four times a day (QID) | CUTANEOUS | Status: DC
  Administered 2019-06-21 – 2019-06-28 (×26): 2 g via TOPICAL
  Filled 2019-06-21: qty 100

## 2019-06-21 NOTE — Progress Notes (Signed)
Physical Therapy Session Note  Patient Details  Name: Geoge Lawrance MRN: [id number] Date of Birth: Jun 09, 1985  Today's Date: 06/21/2019 PT Individual Time: 11:15-12:00 PT Minutes: 28  Short Term Goals: Week 2:  PT Short Term Goal 1 (Week 2): mod I gait x 276ft w/LRAD PT Short Term Goal 2 (Week 2): mod I bed to wc w/LRAD PT Short Term Goal 3 (Week 2): Pt will propel wc on outdoor surfaces w/supervision PT Short Term Goal 4 (Week 2): Gait 119ft w/min assist w/straight cane or no AD w/min assist  Skilled Therapeutic Interventions/Progress Updates:  Pt received supine in bed. Agreeable to therapy. Denies any pain. TED hose in place to BLE. Pt is independent with bed mobility. Shoes donned by therapist for time conservation. Squat pivot transfer EOB <> W/C with supervision. Pt is independent with W/C brake and armrest management. Pt independently propelled self 100 feet to/from therapy gym with W/C. Performed sit <> stand with RW and CGA throughout session. Standing in the hallway, pt performed forwards, backwards, and lateral toe taps with the L foot 1x5 each direction while holding onto railing with R hand. Pt quickly fatigued with this activity, and their RLE started buckling d/t decreased strength and endurance. Because of this, moved pt to the parallel bars where pt performed forwards, backwards, and lateral toe taps in standing with the R foot 1x5 each direction while holding onto both railings for increased stability. During this, pt noted to excessively lean on the LUE for support. Pt also c/o L knee pain that worsens with bending the L knee. Performed stairs laterally with pt sidestepping up and down a total of 4 six-inch stairs with both hands on the L rail and CGA. Pt reports L knee pain resolves with sidestepping. Long discussion with pt regarding safety of using RW while ascending and descending stairs. Pt understands and agrees that this is unsafe at their current functional level, and will  defer trialing this to home health PT. Pt left supine in bed with all needs in reach. All questions answered.   Therapy Documentation Precautions:  Precautions Precautions: Fall Precaution Booklet Issued: No Precaution Comments: watch BP and HR Restrictions Weight Bearing Restrictions: Yes  Therapy/Group: Individual Therapy  Nadeen Landau, SPT   06/21/2019, 4:33 PM

## 2019-06-21 NOTE — Plan of Care (Signed)
  Problem: Consults Goal: RH SPINAL CORD INJURY PATIENT EDUCATION Description:  See Patient Education module for education specifics.  Outcome: Progressing   Problem: SCI BOWEL ELIMINATION Goal: RH STG MANAGE BOWEL WITH ASSISTANCE Description: STG Manage Bowel with min Assistance. Outcome: Progressing Goal: RH STG MANAGE BOWEL W/EQUIPMENT W/ASSISTANCE Description: STG Manage Bowel With Equipment With min Assistance Outcome: Progressing Goal: RH STG SCI MANAGE BOWEL PROGRAM W/ASSIST OR AS APPROPRIATE Description: STG SCI Manage bowel program w/min assist or as appropriate. Outcome: Progressing   Problem: SCI BLADDER ELIMINATION Goal: RH STG MANAGE BLADDER WITH EQUIPMENT WITH ASSISTANCE Description: STG Manage Bladder With Equipment With mod I Assistance Outcome: Progressing   Problem: RH SKIN INTEGRITY Goal: RH STG SKIN FREE OF INFECTION/BREAKDOWN Outcome: Progressing Goal: RH STG MAINTAIN SKIN INTEGRITY WITH ASSISTANCE Description: STG Maintain Skin Integrity With min Assistance. Outcome: Progressing   Problem: RH SAFETY Goal: RH STG ADHERE TO SAFETY PRECAUTIONS W/ASSISTANCE/DEVICE Description: STG Adhere to Safety Precautions With reminders each shift Outcome: Progressing   Problem: RH PAIN MANAGEMENT Goal: RH STG PAIN MANAGED AT OR BELOW PT'S PAIN GOAL Description: STG pt's pain level less than 4 on scale of 1-10 Outcome: Progressing   Problem: RH KNOWLEDGE DEFICIT SCI Goal: RH STG INCREASE KNOWLEDGE OF SELF CARE AFTER SCI Description: Pt. will understand/demonstrate management of bowel program with minimal assistance.  Pt. will demonstrate understanding of pain management techniques  Pt. will verbalize understanding of prescribed medications Outcome: Progressing

## 2019-06-21 NOTE — Progress Notes (Signed)
Occupational Therapy Session Note  Patient Details  Name: Jacob Lambert MRN: [id number] Date of Birth: [birth year]  Today's Date: 06/21/2019 OT Individual Time: 1330-1430 OT Individual Time Calculation (min): 60 min    Short Term Goals: Week 2:  OT Short Term Goal 1 (Week 2): Pt will transfer wiht S overall consistently with LRAD to toilet OT Short Term Goal 1 - Progress (Week 2): Met OT Short Term Goal 2 (Week 2): Pt will step over shower edge wiht CGA using LRAD OT Short Term Goal 2 - Progress (Week 2): Met OT Short Term Goal 3 (Week 2): Pt will complete LB dressing with S OT Short Term Goal 3 - Progress (Week 2): Met  Skilled Therapeutic Interventions/Progress Updates:    Pt resting in bed upon arrival.  Initial focus on ongoing discharge planning with emphasis on prognosis and expectations. Emotional support provided throughout. Bed mobility and squat pivot transfer to w/c with supervision.  Pt propelled w/c to Day Room and engaged in standing tasks on Airex foam pad while using LUE/hand to place and remove pegs on peg board.  Pt challenged with using only digits 3,4,and 5 opposing their thumb. Pt initially placed and removed 10 pegs before rest and 15 pegs on subsequent task.  Pt commented that their hand fatigued quicker then legs.  Pt completed tasks without UE support and no LOB. Pt propelled w/c back to room and transferred back to bed.  Pt remained in bed with RN present and bed alarm activated.   Therapy Documentation Precautions:  Precautions Precautions: Fall Precaution Booklet Issued: No Precaution Comments: watch BP and HR Restrictions Weight Bearing Restrictions: Yes   Pain:  Pt c/o ongoing pain in L 3rd digit and numbness in digits 4 and 5; emotional support and meds admin by RN at end of session   Therapy/Group: Individual Therapy  [name] 06/21/2019, 2:57 PM

## 2019-06-21 NOTE — Progress Notes (Signed)
Jacob Lambert PHYSICAL MEDICINE & REHABILITATION PROGRESS NOTE   Subjective/Complaints: Continues to have severe neuropathic pain in left 3rd digit.  Made it toilet with BM yesterday. Still no benefits from dig stim but suppository is helping  Therapy sessions are going very well.  ROS:  Pt denies SOB, abd pain, CP, N/V/C/D, and vision changes  Objective:   No results found. No results for input(s): WBC, HGB, HCT, PLT in the last 72 hours. No results for input(s): NA, K, CL, CO2, GLUCOSE, BUN, CREATININE, CALCIUM in the last 72 hours.  Intake/Output Summary (Last 24 hours) at 06/21/2019 1901 Last data filed at 06/21/2019 1825 Gross per 24 hour  Intake 960 ml  Output 325 ml  Net 635 ml     Physical Exam: Vital Signs Blood pressure 102/72, pulse 80, temperature 97.8 F (36.6 C), temperature source Oral, resp. rate 18, height 5' 10.5" (1.791 m), weight 70.6 kg, SpO2 98 %. Constitutional: No distress . Vital signs reviewed. Sitting up in manual w/c in room NAD HEENT: EOMI, oral membranes moist Neck: supple Cardiovascular: RRR  Respiratory/Chest: CTA b/l GI/Abdomen: Soft, NT, ND, (+)BS  Ext: no clubbing, cyanosis, or edema Psych: appropriate Skin: Warm and dry.  Intact. Musc: No edema in extremities.  No tenderness in extremities. Neuro: Alert Facial muscles intact Motor:  RUE- 4+/5 in deltoid, biceps, triceps, WE, grip and finger abd LUE- 4+/5 in same muscles except 3-/5 in finger abd RLE_ HF 4+/5, KE 5-/5, DF and PF 5-/5 LLE- HF 4/5, KE 5-/5, DF and PF 5-/5 4-5 beats clonus RLE , 2-3 beats LLE still Functional mobility: transfers with supervision.   Assessment/Plan: 1. 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  Physiatrist is providing close team supervision and 24 hour management of active medical problems listed  below.  Physiatrist and rehab team continue to assess barriers to discharge/monitor patient progress toward functional and medical goals  Care Tool:  Bathing    Body parts bathed by patient: Right arm, Left arm, Chest, Abdomen, Front perineal area, Right upper leg, Left upper leg, Right lower leg, Left lower leg, Face, Buttocks   Body parts bathed by helper: Buttocks     Bathing assist Assist Level: Contact Guard/Touching assist     Upper Body Dressing/Undressing Upper body dressing   What is the patient wearing?: Dress    Upper body assist Assist Level: Set up assist    Lower Body Dressing/Undressing Lower body dressing      What is the patient wearing?: Pants     Lower body assist Assist for lower body dressing: Contact Guard/Touching assist     Toileting Toileting    Toileting assist Assist for toileting: Dependent - Patient 0% Assistive Device Comment: urinal   Transfers Chair/bed transfer  Transfers assist     Chair/bed transfer assist level: Supervision/Verbal cueing Chair/bed transfer assistive device: Programmer, multimedia   Ambulation assist      Assist level: Contact Guard/Touching assist Assistive device: Walker-rolling Max distance: 158ft   Walk 10 feet activity   Assist     Assist level: Contact Guard/Touching assist Assistive device: Walker-rolling   Walk 50 feet activity   Assist    Assist level: Contact Guard/Touching assist Assistive device: Walker-rolling    Walk 150 feet activity   Assist    Assist level: Contact Guard/Touching assist Assistive device: Walker-rolling  Walk 10 feet on uneven surface  activity   Assist Walk 10 feet on uneven surfaces activity did not occur: Safety/medical concerns   Assist level: Contact Guard/Touching assist Assistive device: Walker-rolling   Wheelchair     Assist Will patient use wheelchair at discharge?: Yes Type of Wheelchair: Manual    Wheelchair  assist level: Independent Max wheelchair distance: > 300 feet    Wheelchair 50 feet with 2 turns activity    Assist        Assist Level: Independent   Wheelchair 150 feet activity     Assist      Assist Level: Independent   Blood pressure 102/72, pulse 80, temperature 97.8 F (36.6 C), temperature source Oral, resp. rate 18, height 5' 10.5" (1.791 m), weight 70.6 kg, SpO2 98 %.  Medical Problem List and Plan: 1.  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  Status post C6-7 laminoplasty resection of spinal cord tumor 06/01/2019.  Decadron taper completed  Continue CIR  Transferring with supervision.  2.  Antithrombotics: -DVT/anticoagulation: Lovenox.  Will need for 2-3 months depending on walking ability  Vascular study negative for DVT             -antiplatelet therapy: N/A 3. Pain Management: Neurontin 600 mg 3 times daily, Lidoderm patch as directed, Valium 10 mg every 6 hours as needed muscle spasms, Robaxin 750 mg every 6 hours as needed muscle spasms, oxycodone as needed  Topamax 25 daily started on 6/6 for migraines per patient request  6/8- change to QHS so doesn't sedate pt. Change lidoderm to 8am to 8pm- changed.   6/15- pain is improved.   6/16: Pain worst in left 3rd digit- diclofenac gel prescribed.  4. Mood: - don't think pt needs meds- will add if needed  6/9- will add Celexa 20 mg daily for affect/mood  6/12- no complaints of side effects, pt very appreciative that it was started  6/16: has noted benefits from medication.              -antipsychotic agents: N/A 5. Neuropsych: This patient is capable of making decisions on their own behalf. 6. Skin/Wound Care: Routine skin checks 7. Fluids/Electrolytes/Nutrition: Routine in and outs  BMP within acceptable range on 6/4  6/8- looks good 6/7- no issues 8.  Neurogenic bowel.    Added suppository nightly and digital  stimulation of rectum to improve constipation and bowel movements- if recovers ability to have BMs on their own, will stop.   6/10- <1 hour til results after bowel program last night- better   6/13- BM <1 hr after suppository- patterns improving, continent  6/14- NEEDS dig stim- explained that's why not having results fast  6/16: Had BM last night with suppository. Dig stim was performed.  9. Minimal PVRs- will not reorder PVRs- voiding OK-  10. Spasticity- explained it's normal that it gets worse over time- is very mild right now- will monitor  6/7- on valium 10 mg q6 hours prn for spasms  6/11 baclofen added, 5mg  tid  6/13 pt wants to treat spasms further. I told them we can increase baclofen to 10mg  TID, but we will need to watch for sedation, hypotension, etc. Pt understands and wants to try.   6/14- no increased sedation- no side effects so far- not sure if helping yet  6/15: Patient says they are experiencing benefit without sedation. 11. Leukocytosis  WBCs 13.8 on 6/4, labs  ordered for tomorrow  6/7- WBC down to 11.7k- doing well  Afebrile 12.  Transaminitis  LFTs elevated on 6/4, labs ordered for tomorrow  6/7- actually slightly higher- 58/195- from 48/188- not on any meds that would cause it- will follow 13. Tachycardia  6/7- Will add metoprolol 12.5 mg BID for fast heart rate  6/9- Heart rate ~ 100-  6/13 HR in 70's to 80's. Given #14 and that we're increasing baclofen, I'll hold metoprolol for now    6/15: Well controlled  14. Presyncopal episode after bm  -likely vasovagal along with myelopathy, med effects etc  -discussed the importance of sitting down if experiences symptoms  -avoid excessive valsalva with emptying  -adequate fluid intake  -further med adjustments if needed.   -6/15: no more similar episodes.   LOS: 13 days A FACE TO FACE EVALUATION WAS PERFORMED  Jacob Lambert P Jacob Lambert 06/21/2019, 7:01 PM

## 2019-06-21 NOTE — Progress Notes (Signed)
Occupational Therapy Weekly Progress Note  Patient Details  Name: Jacob Lambert MRN: [id number] Date of Birth: 11/26/1985  Beginning of progress report period: June 14, 2019 End of progress report period: June 21, 2019  Patient has met 3 of 3 short term goals.  Pt is making steady progress with BADLs and functional transfers/ambulation with RW. Pt currently completes bathing/dressing and functional tasks with supervision and occasional CGA with dynamic standing activities. Pt fatigues quickly but has appropriate self awareness when they are fatigues.  Pt has appropriate self awareness of their deficits/limitations and is incorporating appropriate compensatory strategies/techniques in daily routine.   Patient continues to demonstrate the following deficits: muscle weakness, decreased cardiorespiratoy endurance, impaired timing and sequencing, unbalanced muscle activation and decreased coordination and decreased standing balance, decreased postural control and decreased balance strategies and therefore will continue to benefit from skilled OT intervention to enhance overall performance with BADL, iADL and Vocation.  Patient progressing toward long term goals..  Continue plan of care.  OT Short Term Goals Week 2:  OT Short Term Goal 1 (Week 2): Pt will transfer wiht S overall consistently with LRAD to toilet OT Short Term Goal 1 - Progress (Week 2): Met OT Short Term Goal 2 (Week 2): Pt will step over shower edge wiht CGA using LRAD OT Short Term Goal 2 - Progress (Week 2): Met OT Short Term Goal 3 (Week 2): Pt will complete LB dressing with S OT Short Term Goal 3 - Progress (Week 2): Met Week 3:  OT Short Term Goal 1 (Week 3): STG=LTG secondary to ELOS   Lanier, Thomas Chappell 06/21/2019, 6:52 AM   

## 2019-06-21 NOTE — Progress Notes (Signed)
Per patient request bowel program moved to 8pm. Bowel program completed. Patient had a bowel movement at 2123, rectal vault clear of stool.

## 2019-06-21 NOTE — Progress Notes (Signed)
Occupational Therapy Session Note  Patient Details  Name: Jacob Lambert MRN: [id number] Date of Birth: 1985-09-08  Today's Date: 06/21/2019 OT Individual Time: 0900-1000 OT Individual Time Calculation (min): 60 min    Short Term Goals: Week 1:  OT Short Term Goal 1 (Week 1): Pt will transfer wiht S overall consistently with LRAD to toilet OT Short Term Goal 1 - Progress (Week 1): Progressing toward goal OT Short Term Goal 2 (Week 1): Pt will step over shower edge wiht CGA using LRAD OT Short Term Goal 2 - Progress (Week 1): Progressing toward goal OT Short Term Goal 3 (Week 1): Pt will complete LB dressing with S OT Short Term Goal 3 - Progress (Week 1): Progressing toward goal OT Short Term Goal 4 (Week 1): Pt will complete UB dressing wiht set up OT Short Term Goal 4 - Progress (Week 1): Met  Skilled Therapeutic Interventions/Progress Updates:    1:1. Pt received in bed agreeable to OT with no report of pain except in middle finder on LUE which MD present and prescribing gel cream. Pt completes donning dress and teds in bed level per pt preference. Pt completes transfer and oral care with set up. Pt provided with paints and sandpaper as pt wants to paint walker. Pt very excited and uses BUE for bimanual [place] tasks to sand and paint walker legs seated for energy conservation. Pt very excited about project to continue in future sessions. Pt returns to room, transfers back to bed, exit alarm on and call light in reach.  Therapy Documentation Precautions:  Precautions Precautions: Fall Precaution Booklet Issued: No Precaution Comments: watch BP and HR Restrictions Weight Bearing Restrictions: Yes General:   Vital Signs: Therapy Vitals Temp: 97.7 F (36.5 C) Pulse Rate: 71 Resp: 16 BP: 100/73 Patient Position (if appropriate): Lying Oxygen Therapy SpO2: 98 % O2 Device: Room Air Pain:   ADL: ADL Eating: Supervision/safety Grooming: Minimal assistance Where Assessed-Grooming:  Sitting at sink Upper Body Bathing: Supervision/safety Where Assessed-Lower Body Bathing: Edge of bed Upper Body Dressing: Maximal assistance (shoes only) Vision   Perception    Praxis   Exercises:   Other Treatments:     Therapy/Group: Individual Therapy  [name] 06/21/2019, 9:45 AM

## 2019-06-21 NOTE — Progress Notes (Signed)
Occupational Therapy Session Note  Patient Details  Name: Vishnu Moeller MRN: [id number] Date of Birth: Nov 23, 1985  Today's Date: 06/21/2019 OT Individual Time: 2194-7125 OT Individual Time Calculation (min): 35 min    Short Term Goals: Week 1:  OT Short Term Goal 1 (Week 1): Pt will transfer wiht S overall consistently with LRAD to toilet OT Short Term Goal 1 - Progress (Week 1): Progressing toward goal OT Short Term Goal 2 (Week 1): Pt will step over shower edge wiht CGA using LRAD OT Short Term Goal 2 - Progress (Week 1): Progressing toward goal OT Short Term Goal 3 (Week 1): Pt will complete LB dressing with S OT Short Term Goal 3 - Progress (Week 1): Progressing toward goal OT Short Term Goal 4 (Week 1): Pt will complete UB dressing wiht set up OT Short Term Goal 4 - Progress (Week 1): Met  Skilled Therapeutic Interventions/Progress Updates:    Treatment session with focus on [facility], activity endurance, and functional transfers. Pt reported no pain during session. Pt received semi-reclined in bed. Pt completed supine to sit and squat pivot transfers to w/c and to bed with supervision. Provided pt with paint and sandpaper to paint walker and provided education to ensure paint did not impede RW function. Completed painting task to target increased [place]. Attempted to complete in standing to increase challenge, pt reported "I stood earlier", completed task seated in w/c for energy conservation. Ended session with pt semi-reclined in bed with bed alarm on and all needs within reach.  Therapy Documentation Precautions:  Precautions Precautions: Fall Precaution Booklet Issued: No Precaution Comments: watch BP and HR Restrictions Weight Bearing Restrictions: Yes  Therapy/Group: Individual Therapy  Michelle Nasuti 06/21/2019, 4:15 PM

## 2019-06-22 ENCOUNTER — Inpatient Hospital Stay (HOSPITAL_COMMUNITY): Payer: BC Managed Care – PPO

## 2019-06-22 ENCOUNTER — Inpatient Hospital Stay (HOSPITAL_COMMUNITY): Payer: BC Managed Care – PPO | Admitting: Physical Therapy

## 2019-06-22 ENCOUNTER — Inpatient Hospital Stay (HOSPITAL_COMMUNITY): Payer: BC Managed Care – PPO | Admitting: *Deleted

## 2019-06-22 MED ORDER — SENNOSIDES-DOCUSATE SODIUM 8.6-50 MG PO TABS
2.0000 | ORAL_TABLET | Freq: Every day | ORAL | Status: DC
  Administered 2019-06-23 – 2019-06-28 (×6): 2 via ORAL
  Filled 2019-06-22 (×6): qty 2

## 2019-06-22 MED ORDER — ENOXAPARIN (LOVENOX) PATIENT EDUCATION KIT
PACK | Status: DC
  Filled 2019-06-22: qty 1

## 2019-06-22 MED ORDER — DOCUSATE SODIUM 100 MG PO CAPS
100.0000 mg | ORAL_CAPSULE | Freq: Three times a day (TID) | ORAL | Status: DC
  Administered 2019-06-22 – 2019-06-28 (×18): 100 mg via ORAL
  Filled 2019-06-22 (×18): qty 1

## 2019-06-22 NOTE — Evaluation (Signed)
Recreational Therapy Assessment and Plan  Patient Details  Name: Jacob Lambert MRN: [id number] Date of Birth: [birth year] Today's Date: 06/22/2019  Rehab Potential:  Good ELOS:   d/c 6/23  Assessment   Problem List:      Patient Active Problem List   Diagnosis Date Noted  . Ependymoma ([place]) 06/08/2019  . Quadriplegia and quadriparesis ([place])   . 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 Numbness 05/10/2019  . Weakness of left lower extremity 05/10/2019  . 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 Visual disturbance 05/10/2019    Past Medical History:      Past Medical History:  Diagnosis Date  . Anxiety   . Depression   . Headache   . Pneumonia    "walking"   Past Surgical History:       Past Surgical History:  Procedure Laterality Date  . LAMINECTOMY N/A 06/01/2019   Procedure: Cervical six-seven Laminoplasty, biopsy and resection of intramedullary spinal cord tumor;  Surgeon: [name], MD;  Location: [facility];  Service: Neurosurgery;  Laterality: N/A;  . NO PAST SURGERIES      Assessment & Plan Clinical Impression: Jacob Lambert a 34 y.o.right-handed adultwith history of tobacco abuse, anxiety,depression as well as headaches. Pt's pronouns are them/their.Father is an ED physician at [facility] [place]. Presented 06/01/2019 with progressive ataxia and leg weaknesssince February 2021. Recent MRI of the brain negative. 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 Underwent C6-C7 laminoplastyandresection of intradural intramedullary spinal cord tumor 06/01/2019 per Dr. Venetia Constable. Decadron protocol as indicated.Pathologywas grade 2 ependymoma. Medical oncology Dr. Luvenia Heller.Therapy evaluations completed with recommendations of physical medicine rehab consult.  Pt presents with decreased activity tolerance, decreased functional mobility, decreased balance, decreased coordination, decreased vision, feeling of stress Limiting pt's  independence with leisure/community pursuits.  Met with pt today to discuss TR services, leisure interests, activity analysis with potential modifications and community pursuits including participation in the arts, and work/travel adaptations.    Plan   1 TR session >20 minutes prior to discharge  Recommendations for other services: Neuropsych  Discharge Criteria: Patient will be discharged from TR if patient refuses treatment 3 consecutive times without medical reason.  If treatment goals not met, if there is a change in medical status, if patient makes no progress towards goals or if patient is discharged from hospital.  The above assessment, treatment plan, treatment alternatives and goals were discussed and mutually agreed upon: by patient  [place] 06/22/2019, 3:42 PM

## 2019-06-22 NOTE — Progress Notes (Signed)
Physical Therapy Session Note  Patient Details  Name: Jacob Lambert MRN: [id number] Date of Birth: 1985/04/27  Today's Date: 06/22/2019 PT Individual Time: 0800-0900 PT Individual Time Calculation (min): 60 min  Short Term Goals: Week 2:  PT Short Term Goal 1 (Week 2): mod I gait x 278ft w/LRAD PT Short Term Goal 2 (Week 2): mod I bed to wc w/LRAD PT Short Term Goal 3 (Week 2): Pt will propel wc on outdoor surfaces w/supervision PT Short Term Goal 4 (Week 2): Gait 184ft w/min assist w/straight cane or no AD w/min assist  Skilled Therapeutic Interventions/Progress Updates:  Pt received supine in bed. Agreeable to therapy. Denies any pain. Pt is independent with bed mobility. Pt is independent with donning clothes. Pt received daily meds from RN during therapy session. Knee high TED hose placed to BLE by therapist. Shoes donned by therapist for time conservation. SPT performed with RW and CGA throughout session. Pt independently propelled themself 150 feet to therapy gym with W/C. Sit <> stand performed with RW and CGA throughout session. TKE in standing with RW and CGA with orange theraband held by therapist for resistance 2x10 each leg to increase LE strength. Verbal and tactile cues required for correct performance of exercise. Seated ball throw/catch using rebounder 2x30 to improve dynamic sitting balance and coordination. Ambulated 150 feet from therapy gym to room with RW and CGA. Pt left semi-reclined in bed with all needs in reach. All questions answered.  Therapy Documentation Precautions:  Precautions Precautions: Fall Precaution Booklet Issued: No Precaution Comments: watch BP and HR Restrictions Weight Bearing Restrictions: No  Therapy/Group: Individual Therapy   Nadeen Landau, SPT   06/22/2019, 3:52 PM

## 2019-06-22 NOTE — Progress Notes (Signed)
[facility] PHYSICAL MEDICINE & REHABILITATION PROGRESS NOTE   Subjective/Complaints: Discussing his aspirations for bringing film to the hospital setting.   ROS:  Pt denies SOB, abd pain, CP, N/V/C/D, and vision changes  Objective:   No results found. No results for input(s): WBC, HGB, HCT, PLT in the last 72 hours. No results for input(s): NA, K, CL, CO2, GLUCOSE, BUN, CREATININE, CALCIUM in the last 72 hours.  Intake/Output Summary (Last 24 hours) at 06/22/2019 1200 Last data filed at 06/22/2019 0740 Gross per 24 hour  Intake 600 ml  Output 950 ml  Net -350 ml     Physical Exam: Vital Signs Blood pressure 108/73, pulse 64, temperature (!) 97.5 F (36.4 C), temperature source Oral, resp. rate 18, height 5' 10.5" (1.791 m), weight 70.6 kg, SpO2 99 %. Constitutional: No distress . Vital signs reviewed. Sitting up in manual w/c in room NAD HEENT: EOMI, oral membranes moist Neck: supple Cardiovascular: RRR  Respiratory/Chest: CTA b/l GI/Abdomen: Soft, NT, ND, (+)BS  Ext: no clubbing, cyanosis, or edema Psych: appropriate, talkative, personable, in good spirits Skin: Warm and dry.  Intact. Musc: No edema in extremities.  No tenderness in extremities. Neuro: Alert Facial muscles intact Motor:  RUE- 4+/5 in deltoid, biceps, triceps, WE, grip and finger abd LUE- 4+/5 in same muscles except 3-/5 in finger abd RLE_ HF 4+/5, KE 5-/5, DF and PF 5-/5 LLE- HF 4/5, KE 5-/5, DF and PF 5-/5 4-5 beats clonus RLE , 2-3 beats LLE still Functional mobility: transfers with supervision.   Assessment/Plan: 1. 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  Physiatrist is providing close team supervision and 24 hour management of active medical problems listed below.  Physiatrist and rehab team continue to assess barriers to discharge/monitor patient  progress toward functional and medical goals  Care Tool:  Bathing    Body parts bathed by patient: Right arm, Left arm, Chest, Abdomen, Front perineal area, Right upper leg, Left upper leg, Right lower leg, Left lower leg, Face, Buttocks   Body parts bathed by helper: Buttocks     Bathing assist Assist Level: Contact Guard/Touching assist     Upper Body Dressing/Undressing Upper body dressing   What is the patient wearing?: Dress    Upper body assist Assist Level: Set up assist    Lower Body Dressing/Undressing Lower body dressing      What is the patient wearing?: Pants     Lower body assist Assist for lower body dressing: Contact Guard/Touching assist     Toileting Toileting    Toileting assist Assist for toileting: Dependent - Patient 0% Assistive Device Comment: urinal   Transfers Chair/bed transfer  Transfers assist     Chair/bed transfer assist level: Supervision/Verbal cueing Chair/bed transfer assistive device: Programmer, multimedia   Ambulation assist      Assist level: Contact Guard/Touching assist Assistive device: Walker-rolling Max distance: 119ft   Walk 10 feet activity   Assist     Assist level: Contact Guard/Touching assist Assistive device: Walker-rolling   Walk 50 feet activity   Assist    Assist level: Contact Guard/Touching assist Assistive device: Walker-rolling    Walk 150 feet activity   Assist    Assist level: Contact Guard/Touching assist Assistive device: Walker-rolling    Walk 10 feet on uneven surface  activity   Assist Walk 10 feet on uneven surfaces  activity did not occur: Safety/medical concerns   Assist level: Contact Guard/Touching assist Assistive device: Aeronautical engineer Will patient use wheelchair at discharge?: Yes Type of Wheelchair: Manual    Wheelchair assist level: Independent Max wheelchair distance: > 300 feet    Wheelchair 50 feet with 2  turns activity    Assist        Assist Level: Independent   Wheelchair 150 feet activity     Assist      Assist Level: Independent   Blood pressure 108/73, pulse 64, temperature (!) 97.5 F (36.4 C), temperature source Oral, resp. rate 18, height 5' 10.5" (1.791 m), weight 70.6 kg, SpO2 99 %.  Medical Problem List and Plan: 1.  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  Status post C6-7 laminoplasty resection of spinal cord tumor 06/01/2019.  Decadron taper completed  Continue CIR  Transferring with supervision.  2.  Antithrombotics: -DVT/anticoagulation: Lovenox.  Will need for 2-3 months depending on walking ability  Vascular study negative for DVT             -antiplatelet therapy: N/A 3. Pain Management: Neurontin 600 mg 3 times daily, Lidoderm patch as directed, Valium 10 mg every 6 hours as needed muscle spasms, Robaxin 750 mg every 6 hours as needed muscle spasms, oxycodone as needed  Topamax 25 daily started on 6/6 for migraines per patient request  6/8- change to QHS so doesn't sedate pt. Change lidoderm to 8am to 8pm- changed.   6/15- pain is improved.   6/16: Pain worst in left 3rd digit- diclofenac gel prescribed.  4. Mood: - don't think pt needs meds- will add if needed  6/9- will add Celexa 20 mg daily for affect/mood  6/12- no complaints of side effects, pt very appreciative that it was started  6/16: has noted benefits from medication.   6/17: appears to be well controlled.              -antipsychotic agents: N/A 5. Neuropsych: This patient is capable of making decisions on their own behalf. 6. Skin/Wound Care: Routine skin checks 7. Fluids/Electrolytes/Nutrition: Routine in and outs  BMP within acceptable range on 6/4  6/8- looks good 6/7- no issues 8.  Neurogenic bowel.    Added suppository nightly and digital stimulation of rectum to improve constipation and bowel  movements- if recovers ability to have BMs on their own, will stop.   6/10- <1 hour til results after bowel program last night- better   6/13- BM <1 hr after suppository- patterns improving, continent  6/14- NEEDS dig stim- explained that's why not having results fast  6/16: Had BM last night with suppository. Dig stim was performed.  9. Minimal PVRs- will not reorder PVRs- voiding OK-  10. Spasticity- explained it's normal that it gets worse over time- is very mild right now- will monitor  6/7- on valium 10 mg q6 hours prn for spasms  6/11 baclofen added, 5mg  tid  6/13 pt wants to treat spasms further. I told them we can increase baclofen to 10mg  TID, but we will need to watch for sedation, hypotension, etc. Pt understands and wants to try.   6/14- no increased sedation- no side effects so far- not sure if helping yet  6/15: Patient says they are experiencing benefit without sedation.  6/17: received one valium this morning. 11. Leukocytosis  WBCs 13.8 on 6/4, labs ordered  for tomorrow  6/7- WBC down to 11.7k- doing well  Afebrile 12.  Transaminitis  LFTs elevated on 6/4, labs ordered for tomorrow  6/7- actually slightly higher- 58/195- from 48/188- not on any meds that would cause it- will follow 13. Tachycardia  6/7- Will add metoprolol 12.5 mg BID for fast heart rate  6/9- Heart rate ~ 100-  6/13 HR in 70's to 80's. Given  6/17: well controlled  14. Presyncopal episode after bm  -likely vasovagal along with myelopathy, med effects etc  -discussed the importance of sitting down if experiences symptoms  -avoid excessive valsalva with emptying  -adequate fluid intake  -further med adjustments if needed.   -6/15: no more similar episodes.   LOS: 14 days A FACE TO FACE EVALUATION WAS PERFORMED  Flynn Lininger P Rhealyn Cullen 06/22/2019, 12:00 PM

## 2019-06-22 NOTE — Progress Notes (Addendum)
Occupational Therapy Session Note  Patient Details  Name: Jacob Lambert MRN: [id number] Date of Birth: Mar 03, 1985  Today's Date: 06/22/2019 OT Individual Time: 1300-1400 OT Individual Time Calculation (min): 60 min    Short Term Goals: Week 2:  OT Short Term Goal 1 (Week 2): Pt will transfer wiht S overall consistently with LRAD to toilet OT Short Term Goal 1 - Progress (Week 2): Met OT Short Term Goal 2 (Week 2): Pt will step over shower edge wiht CGA using LRAD OT Short Term Goal 2 - Progress (Week 2): Met OT Short Term Goal 3 (Week 2): Pt will complete LB dressing with S OT Short Term Goal 3 - Progress (Week 2): Met  Skilled Therapeutic Interventions/Progress Updates:    Pt resting in bed upon arrival and ready to practice shaving with balloon before shaving face and head.  Pt concerned that they will not be able to shave with their L hand (LUE dominant). Pt practiced with RUE and LUE. Pt able to control LUE adequately to attempt shaving face with LUE and head with primarily RUE but using LUE for specific areas. Pt required min A for areas on head where they were unable to safely use either hand. Overall, pt pleased with overall ability to complete task with minimal assistance. Pt requires more then a reasonable amount of time to complete task. Pt returned to bed and remained in bed with all needs within reach, bed alarm activated, and RN present.   Therapy Documentation Precautions:  Precautions Precautions: Fall Precaution Booklet Issued: No Precaution Comments: watch BP and HR Restrictions Weight Bearing Restrictions: No Pain:  Pt c/o burning sensation in 3rd digit on L hand; RN admin meds at end of session  Therapy/Group: Individual Therapy  [name] 06/22/2019, 2:41 PM

## 2019-06-22 NOTE — Progress Notes (Signed)
Occupational Therapy Session Note  Patient Details  Name: Gearl Baratta MRN: [id number] Date of Birth: 02/11/1985  Today's Date: 06/22/2019 OT Individual Time: 1000-1059 OT Individual Time Calculation (min): 59 min    Short Term Goals: Week 2:  OT Short Term Goal 1 (Week 2): Pt will transfer wiht S overall consistently with LRAD to toilet OT Short Term Goal 1 - Progress (Week 2): Met OT Short Term Goal 2 (Week 2): Pt will step over shower edge wiht CGA using LRAD OT Short Term Goal 2 - Progress (Week 2): Met OT Short Term Goal 3 (Week 2): Pt will complete LB dressing with S OT Short Term Goal 3 - Progress (Week 2): Met  Skilled Therapeutic Interventions/Progress Updates:    Treatment session with focus on community integration and wheelchair management. Pt reported desire to get coffee and practice community integration. Pt received semi-reclined in bed. Completed squat pivot transfer bed < w/c with modI. Educated on leg rest management. Pt would continue to benefit from education on w/c brake and leg rest management. Pt self-propelled w/c community distances, navigating tight corners, automatic doors, and elevators. Pt demonstrated strong carryover of education on w/c management in the elevator, continued to educate on navigating small elevators and elevator doors. Pt completed checkout routine at hospital restaurant counter. Educated on w/c positioning at counter and use of brakes when reaching forward and laterally. Pt poured and sealed coffee cup, educated on strategies for safe transport of hot items with w/c. Pt expressed concern about navigating thresholds in w/c, educated on wheelies with w/c and pt practiced self-propelling on uneven surfaces. Completed static sit <> stand with close supervision with cane. Ended session with pt seated in w/c with PT present. Therapy Documentation Precautions:  Precautions Precautions: Fall Precaution Booklet Issued: No Precaution Comments: watch BP and  HR Restrictions Weight Bearing Restrictions: No  Pain: None reported  Therapy/Group: Individual Therapy  Michelle Nasuti 06/22/2019, 12:06 PM

## 2019-06-22 NOTE — Progress Notes (Addendum)
Patient ID: Jacob Lambert, adult   DOB: [birth year], 34 y.o.   MRN: [id number]   SW met with pt in room to provide [facility] list. SW discussed with pt potential challenges with obtaining home health. Pt understands an alternative is outpatient therapy. SW made pt aware there will be follow-up with their father.   SW left message for pt father [name] [phone]) to discuss above. Sw waiting on follow-up. *SW spoke with pt father on above. States if unable to get [name], OPT can be managed.   Loralee Pacas, MSW, [place] Office: [phone] Cell: [phone] Fax: [phone]

## 2019-06-22 NOTE — Progress Notes (Signed)
Physical Therapy Session Note  Patient Details  Name: Jacob Lambert MRN: [id number] Date of Birth: July 12, 1985  Today's Date: 06/22/2019 PT Individual Time: 1100-1130 PT Individual Time Calculation (min): 30 min   Short Term Goals: Week 1:  PT Short Term Goal 1 (Week 1): Pt will complete transfers with Supervision and LRAD PT Short Term Goal 1 - Progress (Week 1): Met PT Short Term Goal 2 (Week 1): Pt will ambulate x 150 ft with RW at Supervision level PT Short Term Goal 2 - Progress (Week 1): Met PT Short Term Goal 3 (Week 1): Pt will initiate stair training as safe and able PT Short Term Goal 3 - Progress (Week 1): Met Week 2:  PT Short Term Goal 1 (Week 2): mod I gait x 279f w/LRAD PT Short Term Goal 2 (Week 2): mod I bed to wc w/LRAD PT Short Term Goal 3 (Week 2): Pt will propel wc on outdoor surfaces w/supervision PT Short Term Goal 4 (Week 2): Gait 1568fw/min assist w/straight cane or no AD w/min assist Week 3:     Skilled Therapeutic Interventions/Progress Updates:    PAIN  Neck, L middle finger, not quantified.  Treatment to tolerance  Gait w/RW and close supervison x 10036fo gym, mild wobbles at knees w/loading, no buckling.  Pt ambulated to parallel bars for the following:  Standing in parallel bars - sidestepping using mirror for feedback and orange TB resistance [id number] direction x 3, gait 15ft78fmat/turn/sit w/close supervision.  Repeated STS using OTB resistance x 25-30 reps x 2 sets  Gait w/RW back to room/100ft 55fescribed above.  Upon entering room, wc in path, pt stood w/RW and stands on RLE while attempting to roll wc out of way using LLE, RLE progressivly increases in flexion but does not buckle.  Therapist provided max cues for safety/discuss fall risk w/this.  Turn/sit to bed w/supervision.  Sit to supine w/supervision. Pt left supine w/rails up x 3, alarm set, bed in lowest position, and needs in reach.    Therapy Documentation Precautions:   Precautions Precautions: Fall Precaution Booklet Issued: No Precaution Comments: watch BP and HR Restrictions Weight Bearing Restrictions: No    Therapy/Group: Individual Therapy  BarbarCallie Fielding BThe [place]2021, 12:39 PM

## 2019-06-22 NOTE — Progress Notes (Signed)
Bowel program completed. Patient had a bowel movement. recatal vault clear.

## 2019-06-22 NOTE — Plan of Care (Signed)
  Problem: Consults Goal: RH SPINAL CORD INJURY PATIENT EDUCATION Description:  See Patient Education module for education specifics.  Outcome: Progressing   Problem: SCI BOWEL ELIMINATION Goal: RH STG MANAGE BOWEL WITH ASSISTANCE Description: STG Manage Bowel with min Assistance. Outcome: Progressing Goal: RH STG MANAGE BOWEL W/EQUIPMENT W/ASSISTANCE Description: STG Manage Bowel With Equipment With min Assistance Outcome: Progressing Goal: RH STG SCI MANAGE BOWEL PROGRAM W/ASSIST OR AS APPROPRIATE Description: STG SCI Manage bowel program w/min assist or as appropriate. Outcome: Progressing   Problem: SCI BLADDER ELIMINATION Goal: RH STG MANAGE BLADDER WITH EQUIPMENT WITH ASSISTANCE Description: STG Manage Bladder With Equipment With mod I Assistance Outcome: Progressing   Problem: RH SKIN INTEGRITY Goal: RH STG SKIN FREE OF INFECTION/BREAKDOWN Outcome: Progressing Goal: RH STG MAINTAIN SKIN INTEGRITY WITH ASSISTANCE Description: STG Maintain Skin Integrity With min Assistance. Outcome: Progressing   Problem: RH SAFETY Goal: RH STG ADHERE TO SAFETY PRECAUTIONS W/ASSISTANCE/DEVICE Description: STG Adhere to Safety Precautions With reminders each shift Outcome: Progressing   Problem: RH PAIN MANAGEMENT Goal: RH STG PAIN MANAGED AT OR BELOW PT'S PAIN GOAL Description: STG pt's pain level less than 4 on scale of 1-10 Outcome: Progressing   Problem: RH KNOWLEDGE DEFICIT SCI Goal: RH STG INCREASE KNOWLEDGE OF SELF CARE AFTER SCI Description: Pt. will understand/demonstrate management of bowel program with minimal assistance.  Pt. will demonstrate understanding of pain management techniques  Pt. will verbalize understanding of prescribed medications Outcome: Progressing

## 2019-06-23 ENCOUNTER — Inpatient Hospital Stay (HOSPITAL_COMMUNITY): Payer: BC Managed Care – PPO

## 2019-06-23 ENCOUNTER — Inpatient Hospital Stay (HOSPITAL_COMMUNITY): Payer: BC Managed Care – PPO | Admitting: Physical Therapy

## 2019-06-23 MED ORDER — POLYETHYLENE GLYCOL 3350 17 G PO PACK
17.0000 g | PACK | Freq: Two times a day (BID) | ORAL | Status: DC
  Administered 2019-06-23 – 2019-06-24 (×2): 17 g via ORAL
  Filled 2019-06-23 (×2): qty 1

## 2019-06-23 NOTE — Progress Notes (Signed)
Recreational Therapy Session Note  Patient Details  Name: Jacob Lambert MRN: [id number] Date of Birth: 07/21/1985 Today's Date: 06/23/2019  Pain: no c/o Skilled Therapeutic Interventions/Progress Updates: Met with pt today at bedside to further discuss and problem solve through discharge planning & life beyond hospitalization. Discussion included   emotional & social needs in addition to physical needs/accessibility.  Emphasis on importance of  social support in further rehab and recovery.  Pt stated understanding and is independently researching options. Pt is proactively considering all options and is motivated for further recovery while acknowledging challenges in their future.   Therapy/Group: Individual Therapy   SIMPSON,LISA 06/23/2019, 1:17 PM  

## 2019-06-23 NOTE — Progress Notes (Signed)
Occupational Therapy Session Note  Patient Details  Name: Shayon Trompeter MRN: [id number] Date of Birth: 07/28/1985  Today's Date: 06/23/2019 OT Individual Time: 0900-1015 OT Individual Time Calculation (min): 75 min    Short Term Goals: Week 2:  OT Short Term Goal 1 (Week 2): Pt will transfer wiht S overall consistently with LRAD to toilet OT Short Term Goal 1 - Progress (Week 2): Met OT Short Term Goal 2 (Week 2): Pt will step over shower edge wiht CGA using LRAD OT Short Term Goal 2 - Progress (Week 2): Met OT Short Term Goal 3 (Week 2): Pt will complete LB dressing with S OT Short Term Goal 3 - Progress (Week 2): Met  Skilled Therapeutic Interventions/Progress Updates:    Pt resting in bed upon arrival.  Pt concerned that thier insurance will not cover [place]. Discussed options and reassured pt that OP therapies are appropriate if they are unable to get [facility] services. Focus transitioned to BADL retraining with focus on functional amb with RW, standing balance, LUE function and safety awareness to increase independence with BADLs. Pt amb with RW to bathroom and stood at toilet to void before transferring to tub bench in shower.  Pt completed bathing tasks at supervision level. Pt donned brief in bathroom and amb with RW back to EOB to complete dressing tasks including donning Teds. After donning shoes pt amb with RW into hallway.  Pt amb with RW to elevators before resting.  On return trip to room pt removed their LUE/hand from walker and experienced slight LOB requiring min A to correct. Reeducated pt on importance of keeping BUE on RW when ambulating. Pt returned to bed and remained in bed with all needs within reach and bed alarm activated.   Therapy Documentation Precautions:  Precautions Precautions: Fall Precaution Booklet Issued: No Precaution Comments: watch BP and HR Restrictions Weight Bearing Restrictions: No    Pain: Pain Assessment Pain Scale: 0-10 Pain Score: 5   Pain Type: Acute pain Pain Location: Neck Pain Frequency: Constant Pain Onset: On-going Patients Stated Pain Goal: 2 Pain Intervention(s): RN aware and repositioned  Therapy/Group: Individual Therapy  [name] 06/23/2019, 11:52 AM

## 2019-06-23 NOTE — Progress Notes (Signed)
Physical Therapy Weekly Progress Note  Patient Details  Name: Jacob Lambert MRN: [id number] Date of Birth: [birth year]  Beginning of progress report period: June 16, 2019 End of progress report period: June 23, 2019  Today's Date: 06/23/2019 PT Individual Time: 1115-1208 PT Individual Time Calculation (min): 53 min   Patient has met 3 of 4 short term goals. Pt is currently independent with bed mobility, supervision for transfers, CGA for gait up to 200 feet with RW, CGA for laterally ascending/descending 4 six-inch stairs with both hands on the L handrail, and independent with W/C propulsion up to 200 feet.  Patient continues to demonstrate the following deficits muscle weakness, decreased coordination and decreased motor planning and decreased visual acuity and therefore will continue to benefit from skilled PT intervention to increase functional independence with mobility.  Patient progressing toward long term goals..  Continue plan of care.  PT Short Term Goals Week 2:  PT Short Term Goal 1 (Week 2): mod I gait x 226f w/LRAD PT Short Term Goal 1 - Progress (Week 2): Met PT Short Term Goal 2 (Week 2): mod I bed to wc w/LRAD PT Short Term Goal 2 - Progress (Week 2): Met PT Short Term Goal 3 (Week 2): Pt will propel wc on outdoor surfaces w/supervision PT Short Term Goal 3 - Progress (Week 2): Met PT Short Term Goal 4 (Week 2): Gait 1552fw/min assist w/straight cane or no AD w/min assist PT Short Term Goal 4 - Progress (Week 2): Discontinued (comment) (continued LE weakness and decreased endurance) Week 3:  PT Short Term Goal 1 (Week 3): = LTG d/t ELOS  Skilled Therapeutic Interventions/Progress Updates:  Pt received semi-reclined in bed. Agreeable to therapy. Denies any pain. TED hose in place to BLE. Pt is independent with bed mobility. Shoes donned by therapist for time conservation. Sit <> stand performed with RW and supervision throughout session. Ambulated 100 feet to/from therapy  gym with RW and CGA. Performed Berg Balance Test. Patient demonstrates increased fall risk as noted by score of 34/56 on Berg Balance Scale.  (<36= high risk for falls, close to 100%; 37-45 significant >80%; 46-51 moderate >50%; 52-55 lower >25%). When pt was performing portion of test when retrieving object from the floor, they experienced a near-fall requiring max A x2 to safely recover and return to sitting EOM. Because of this, recommended pt use a reacher or have someone else retrieve the object for them. Pt understands and agrees that based on BERG score they are at high fall risk when not using RW. Stressed to pt not to focus on their BERG score itself, and that the purpose of this test is to identify problem-areas which can improved. Pt left semi-reclined in bed with all needs in reach. All questions answered.  Therapy Documentation Precautions:  Precautions Precautions: Fall Precaution Booklet Issued: No Precaution Comments: watch BP and HR Restrictions Weight Bearing Restrictions: No  Balance: Berg Balance Test Sit to Stand: Able to stand without using hands and stabilize independently Standing Unsupported: Able to stand 2 minutes with supervision Sitting with Back Unsupported but Feet Supported on Floor or Stool: Able to sit safely and securely 2 minutes Stand to Sit: Controls descent by using hands Transfers: Able to transfer safely, definite need of hands Standing Unsupported with Eyes Closed: Able to stand 10 seconds with supervision Standing Ubsupported with Feet Together: Able to place feet together independently and stand for 1 minute with supervision From Standing, Reach Forward with Outstretched Arm: Can reach  forward >12 cm safely (5") From Standing Position, Pick up Object from Floor: Unable to try/needs assist to keep balance From Standing Position, Turn to Look Behind Over each Shoulder: Looks behind from both sides and weight shifts well Turn 360 Degrees: Needs  assistance while turning Standing Unsupported, Alternately Place Feet on Step/Stool: Able to complete >2 steps/needs minimal assist Standing Unsupported, One Foot in Front: Needs help to step but can hold 15 seconds Standing on One Leg: Able to lift leg independently and hold equal to or more than 3 seconds Total Score: 34  Therapy/Group: Individual Therapy  Nadeen Landau, SPT   06/23/2019, 4:58 PM

## 2019-06-23 NOTE — Progress Notes (Signed)
Patient ID: Kauan Kloosterman, adult   DOB: [birth year], 34 y.o.   MRN: [id number]  SW received updates on preferred [facility]: 1) [facility], 2) [facility], and 3) [facility].   SW spoke with [facility] care [phone]). Declined referral.   SW sent referral to [facility] ([phone]). SW waiting on follow-up.  *referral declined.  SW spoke with [facility] and referral declined.   Pt able to get HHPT/OT with [facility] [facility].   Loralee Pacas, MSW, [place] Office: [phone] Cell: [phone] Fax: [phone]

## 2019-06-23 NOTE — Progress Notes (Signed)
Pt requested bowel program be initiated after family left. This nurse completed a round of dig stim (no bm) and inserted suppository. Upon entering room to follow up with more dig stim, patient was on toilet with NT having a small/medium bowel movement. Patient returned to bed and this nurse completed two more rounds of dig stim with no stool expelled.

## 2019-06-23 NOTE — Progress Notes (Signed)
Dig stim completed and suppository given per MAR.  Patient with moderate size BM but unable to clear it all per patient.  Dig stim provided with no success of passing rest of stool.  As needed medication given per [facility] for BM.  Patient back in bed resting, call light within reach.

## 2019-06-23 NOTE — Progress Notes (Signed)
Jacob Lambert PHYSICAL MEDICINE & REHABILITATION PROGRESS NOTE   Subjective/Complaints: Patient is concerned about not receiving home health services. They have selected three preferred home health services and Jacob Lambert shared this with Jacob Lambert. Unfortunately all three of these declined, but they were able to secure services with [facility]/[place] [facility]. Feels evacuation with bowel program was incomplete last night and would like to increase laxatives.   ROS:  Pt denies SOB, abd pain, CP, N/V/C/D, and vision changes  Objective:   No results found. No results for input(s): WBC, HGB, HCT, PLT in the last 72 hours. No results for input(s): NA, K, CL, CO2, GLUCOSE, BUN, CREATININE, CALCIUM in the last 72 hours.  Intake/Output Summary (Last 24 hours) at 06/23/2019 1251 Last data filed at 06/23/2019 0825 Gross per 24 hour  Intake 410 ml  Output 1150 ml  Net -740 ml     Physical Exam: Vital Signs Blood pressure 112/70, pulse 68, temperature 98.2 F (36.8 C), temperature source Oral, resp. rate 18, height 5' 10.5" (1.791 m), weight 70.6 kg, SpO2 98 %. Constitutional: No distress . Vital signs reviewed. Sitting up in manual w/c in room NAD HEENT: EOMI, oral membranes moist Neck: supple Cardiovascular: RRR  Respiratory/Chest: CTA b/l GI/Abdomen: Soft, NT, ND, (+)BS  Ext: no clubbing, cyanosis, or edema Psych: appropriate, talkative, personable, in good spirits Skin: Warm and dry.  Intact. Musc: No edema in extremities.  No tenderness in extremities. Neuro: Alert Facial muscles intact Motor:  RUE- 4+/5 in deltoid, biceps, triceps, WE, grip and finger abd LUE- 4+/5 in same muscles except 3-/5 in finger abd RLE_ HF 4+/5, KE 5-/5, DF and PF 5-/5 LLE- HF 4/5, KE 5-/5, DF and PF 5-/5 4-5 beats clonus RLE , 2-3 beats LLE still Functional mobility: transfers with supervision, MinA for shaving.   Assessment/Plan: 1. 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  Physiatrist is providing close team supervision and 24 hour management of active medical problems listed below.  Physiatrist and rehab team continue to assess barriers to discharge/monitor patient progress toward functional and medical goals  Care Tool:  Bathing    Body parts bathed by patient: Right arm, Left arm, Chest, Abdomen, Front perineal area, Right upper leg, Left upper leg, Right lower leg, Left lower leg, Face, Buttocks   Body parts bathed by helper: Buttocks     Bathing assist Assist Level: Supervision/Verbal cueing     Upper Body Dressing/Undressing Upper body dressing   What is the patient wearing?: Dress, Pull over shirt    Upper body assist Assist Level: Set up assist    Lower Body Dressing/Undressing Lower body dressing      What is the patient wearing?: Underwear/pull up, Skirt     Lower body assist Assist for lower body dressing: Supervision/Verbal cueing     Toileting Toileting    Toileting assist Assist for toileting: Supervision/Verbal cueing Assistive Device Comment: urinal   Transfers Chair/bed transfer  Transfers assist     Chair/bed transfer assist level: Contact Guard/Touching assist (Simultaneous filing. User may not have seen previous data.) Chair/bed transfer assistive device: Walker (Simultaneous filing. User may not have seen previous data.)   Locomotion Ambulation   Ambulation assist      Assist level: Contact Guard/Touching assist (Simultaneous filing. User may not have seen previous data.) Assistive device: Walker-rolling (Simultaneous filing. User may not have seen previous data.)  Max distance: 136ft (Simultaneous filing. User may not have seen previous data.)   Walk 10 feet activity   Assist     Assist level: Contact Guard/Touching assist (Simultaneous filing. User may not have seen previous  data.) Assistive device: Walker-rolling (Simultaneous filing. User may not have seen previous data.)   Walk 50 feet activity   Assist    Assist level: Contact Guard/Touching assist (Simultaneous filing. User may not have seen previous data.) Assistive device: Walker-rolling (Simultaneous filing. User may not have seen previous data.)    Walk 150 feet activity   Assist    Assist level: Contact Guard/Touching assist Assistive device: Walker-rolling    Walk 10 feet on uneven surface  activity   Assist Walk 10 feet on uneven surfaces activity did not occur: Safety/medical concerns   Assist level: Contact Guard/Touching assist Assistive device: Walker-rolling   Wheelchair     Assist Will patient use wheelchair at discharge?: Yes Type of Wheelchair: Manual    Wheelchair assist level: Independent Max wheelchair distance: 150 feet    Wheelchair 50 feet with 2 turns activity    Assist        Assist Level: Independent   Wheelchair 150 feet activity     Assist      Assist Level: Independent   Blood pressure 112/70, pulse 68, temperature 98.2 F (36.8 C), temperature source Oral, resp. rate 18, height 5' 10.5" (1.791 m), weight 70.6 kg, SpO2 98 %.  Medical Problem List and Plan: 1.  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  Status post C6-7 laminoplasty resection of spinal cord tumor 06/01/2019.  Decadron taper completed  Continue CIR  Transferring with supervision, MinA for shaving 2.  Antithrombotics: -DVT/anticoagulation: Lovenox.  Will need for 2-3 months depending on walking ability  Vascular study negative for DVT             -antiplatelet therapy: N/A 3. Pain Management: Neurontin 600 mg 3 times daily, Lidoderm patch as directed, Valium 10 mg every 6 hours as needed muscle spasms, Robaxin 750 mg every 6 hours as needed muscle spasms, oxycodone as needed  Topamax  25 daily started on 6/6 for migraines per patient request  6/8- change to QHS so doesn't sedate pt. Change lidoderm to 8am to 8pm- changed.   6/15- pain is improved.   6/16: Pain worst in left 3rd digit- diclofenac gel prescribed.  4. Mood: - don't think pt needs meds- will add if needed  6/9- will add Celexa 20 mg daily for affect/mood  6/12- no complaints of side effects, pt very appreciative that it was started  6/16: has noted benefits from medication.   6/17: appears to be well controlled.   6/18: well controlled             -antipsychotic agents: N/A 5. Neuropsych: This patient is capable of making decisions on their own behalf. 6. Skin/Wound Care: Routine skin checks 7. Fluids/Electrolytes/Nutrition: Routine in and outs  BMP within acceptable range on 6/4  6/8- looks good 6/7- no issues 8.  Neurogenic bowel.    Added suppository nightly and digital stimulation of rectum to improve constipation and bowel movements- if recovers ability to have BMs on their own, will stop.   6/10- <1 hour til results after bowel program last night- better   6/13- BM <1 hr after suppository- patterns improving, continent  6/14- NEEDS dig stim- explained that's why not having results fast  6/16: Had BM last night with suppository. Dig stim was performed.   6/18: Had BM after dig stim last night but did not feel they had complete evacuation. Increase Miralax to BID/  9. Minimal PVRs- will not reorder PVRs- voiding OK-  10. Spasticity- explained it's normal that it gets worse over time- is very mild right now- will monitor  6/7- on valium 10 mg q6 hours prn for spasms  6/11 baclofen added, 5mg  tid  6/13 pt wants to treat spasms further. I told them we can increase baclofen to 10mg  TID, but we will need to watch for sedation, hypotension, etc. Pt understands and wants to try.   6/14- no increased sedation- no side effects so far- not sure if helping yet  6/15: Patient says they are experiencing benefit  without sedation.  6/17: received one valium this morning. 11. Leukocytosis  WBCs 13.8 on 6/4, labs ordered for tomorrow  6/7- WBC down to 11.7k- doing well  Afebrile 12.  Transaminitis  LFTs elevated on 6/4, labs ordered for tomorrow  6/7- actually slightly higher- 58/195- from 48/188- not on any meds that would cause it- will follow 13. Tachycardia  6/7- Will add metoprolol 12.5 mg BID for fast heart rate  6/9- Heart rate ~ 100-  6/13 HR in 70's to 80's. Given  6/18: well controlled  14. Presyncopal episode after bm  -likely vasovagal along with myelopathy, med effects etc  -discussed the importance of sitting down if experiences symptoms  -avoid excessive valsalva with emptying  -adequate fluid intake  -further med adjustments if needed.   -6/15: no more similar episodes.   LOS: 15 days A FACE TO FACE EVALUATION WAS PERFORMED  Jacob Lambert 06/23/2019, 12:51 PM

## 2019-06-23 NOTE — Progress Notes (Signed)
Occupational Therapy Session Note  Patient Details  Name: Jacob Lambert MRN: [id number] Date of Birth: 30-Oct-1985  Today's Date: 06/23/2019 OT Individual Time: 1400-1445 OT Individual Time Calculation (min): 45 min    Short Term Goals: Week 2:  OT Short Term Goal 1 (Week 2): Pt will transfer wiht S overall consistently with LRAD to toilet OT Short Term Goal 1 - Progress (Week 2): Met OT Short Term Goal 2 (Week 2): Pt will step over shower edge wiht CGA using LRAD OT Short Term Goal 2 - Progress (Week 2): Met OT Short Term Goal 3 (Week 2): Pt will complete LB dressing with S OT Short Term Goal 3 - Progress (Week 2): Met  Skilled Therapeutic Interventions/Progress Updates:    OT intervention with focus on standing balance while performing simple tasks at kitchen counter.  Pt concerned about ability to wash dishes after discharge. Pt provided rubber gloves to improve ability to grasp dishes.  Pt completed all tasks with CGA.  Pt washed 2 cups, 1 bowl, and 1 glass.  Pt commented that upon completion of task their left knee was getting weak and burning in 3 rd digit had worsened. Discussed energy conservation strategies and planning out activities. Pt returned to room and transferred back to bed.  Pt remained in bed with all needs within reach and bed alarm activated.   Therapy Documentation Precautions:  Precautions Precautions: Fall Precaution Booklet Issued: No Precaution Comments: watch BP and HR Restrictions Weight Bearing Restrictions: No   Pain:  Pt c/o ongoing "burning" in L hand 3rd digit; emotional support   Therapy/Group: Individual Therapy  [name] 06/23/2019, 3:02 PM

## 2019-06-23 NOTE — Progress Notes (Signed)
Physical Therapy Session Note  Patient Details  Name: Jacob Lambert MRN: [id number] Date of Birth: 01-27-1985  Today's Date: 06/23/2019 PT Individual Time: 1445-1540 PT Individual Time Calculation (min): 55 min   Short Term Goals: Week 2:  PT Short Term Goal 1 (Week 2): mod I gait x 216ft w/LRAD PT Short Term Goal 2 (Week 2): mod I bed to wc w/LRAD PT Short Term Goal 3 (Week 2): Pt will propel wc on outdoor surfaces w/supervision PT Short Term Goal 4 (Week 2): Gait 176ft w/min assist w/straight cane or no AD w/min assist  Skilled Therapeutic Interventions/Progress Updates:    Pt received supine in bed, agreeable to PT session. No complaints of pain. Pt is mod I for bed mobility. Assisted pt with donning shoes while seated EOB for time conservation. Squat pivot transfers performed throughout session at Supervision level. Pt is at mod I level for w/c mobility to/from therapy gym with use of BUE. Session focus on demonstration and performance of floor transfer. Discussion with patients with regards to falling at home and how to assess themselves prior to attempting to get up from the floor as well as calling for assist after experiencing a fall. Demonstrated how to get onto hands and knees in quadruped position and crawl towards a piece of sturdy furniture to pull themselves upright and sit on furniture. Pt is able to come from sitting EOM to sitting on mat on floor with min A for safety and balance. Pt is then able to get into quadruped position at Supervision level, crawl back over to mat table, and safely return to sitting EOM initially with min A and progressing to Supervision level. Pt demonstrates good insight into their deficits and good safety awareness when performing floor to mat transfer. Discussed use of a reacher at home and in the community for reaching objects on the floor vs squatting down to pick something up. Pt is able to ambulate around therapy gym with RW and retrieve bean bags from  the floor with use of reacher at Supervision level. Pt trials use of reacher with LUE and RUE and determines they are better able to retreive items with use of RUE but then rely on LUE on RW for stability. Pt to have family purchase reacher for use upon d/c home. Assisted pt back to bed at end of session, squat pivot transfer with Supervision. Pt is mod I for bed mobility. Discussed pt's functional progress up to this point and expected next steps with regards to therapy. Will provide pt with HEP prior to d/c home next week. Pt appreciative of all education and therapy up to this point. Pt left semi-reclined in bed with needs in reach, bed alarm in place at end of session.  Therapy Documentation Precautions:  Precautions Precautions: Fall Precaution Booklet Issued: No Precaution Comments: watch BP and HR Restrictions Weight Bearing Restrictions: No    Therapy/Group: Individual Therapy   Excell Seltzer, PT, DPT  06/23/2019, 3:45 PM

## 2019-06-24 ENCOUNTER — Inpatient Hospital Stay (HOSPITAL_COMMUNITY): Payer: BC Managed Care – PPO

## 2019-06-24 MED ORDER — POLYETHYLENE GLYCOL 3350 17 G PO PACK
17.0000 g | PACK | Freq: Three times a day (TID) | ORAL | Status: DC
  Administered 2019-06-24 – 2019-06-28 (×12): 17 g via ORAL
  Filled 2019-06-24 (×12): qty 1

## 2019-06-24 NOTE — Progress Notes (Signed)
Occupational Therapy Session Note  Patient Details  Name: Jacob Lambert MRN: [id number] Date of Birth: 1985-02-15  Today's Date: 06/24/2019 OT Individual Time: 2542-7062 OT Individual Time Calculation (min): 70 min    Short Term Goals: Week 2:  OT Short Term Goal 1 (Week 2): Pt will transfer wiht S overall consistently with LRAD to toilet OT Short Term Goal 1 - Progress (Week 2): Met OT Short Term Goal 2 (Week 2): Pt will step over shower edge wiht CGA using LRAD OT Short Term Goal 2 - Progress (Week 2): Met OT Short Term Goal 3 (Week 2): Pt will complete LB dressing with S OT Short Term Goal 3 - Progress (Week 2): Met Week 3:  OT Short Term Goal 1 (Week 3): STG=LTG secondary to ELOS  Skilled Therapeutic Interventions/Progress Updates:    1:1. Pt received in bed agreeable to OT with RN present. Pt recalling frustrations with bowel program, however happy that spasm medications seem to be helping. Pt dons dress and pull up from EOB/bed level with set up sit to stand. Pt completes all transfers with MOD I except 1 instance needing subtle cue to reall to lock w/c brakes. Pt completes w/c mobility into/out of elevator with supervision backing in. In elevator trip to cafeteria, discriminatory word "fag" written on wall. Custodial services alerted as well as management. Writing taken down immediately and reported to OT/pt when completed. Pt rightfully upset and OT uses therapeutic listening to affirm pt. Pt agreeable to keep working and problem solves reaching items in top shelf of cafeteria fridge at sit to stand level with VC for setting up w/c and parts management. Pt completes booth transfer in cafeteria seating in prep for community level transfers and OT coaches pt through folding up w/c. OT demo wheelie procedure and pt completes with anti tippers on, and without tippers on (OT spotting pt). Pt returned to room and thanked OT for how situation was handled but still visibly upset about elevator  writing. Exited session wih pt seated in w/c at sink and call light in reach  Therapy Documentation Precautions:  Precautions Precautions: Fall Precaution Booklet Issued: No Precaution Comments: watch BP and HR Restrictions Weight Bearing Restrictions: No General:   Vital Signs: Therapy Vitals Temp: 98.1 F (36.7 C) Pulse Rate: 75 Resp: 18 BP: 115/69 Patient Position (if appropriate): Lying Oxygen Therapy SpO2: 99 % O2 Device: Room Air Pain:   ADL: ADL Eating: Supervision/safety Grooming: Minimal assistance Where Assessed-Grooming: Sitting at sink Upper Body Bathing: Supervision/safety Where Assessed-Lower Body Bathing: Edge of bed Upper Body Dressing: Maximal assistance (shoes only) Vision   Perception    Praxis   Exercises:   Other Treatments:     Therapy/Group: Individual Therapy  [name] 06/24/2019, 8:29 AM

## 2019-06-24 NOTE — Progress Notes (Addendum)
[place] PHYSICAL MEDICINE & REHABILITATION PROGRESS NOTE   Subjective/Complaints: Upon my entry to room, patient was tearful after finding the word "fag" written on the wall of elevator 1057 at 8:40am when they were going downstairs with occupational therapist Mariane Masters. Patient has been crying on and off all morning as was very disturbed by seeing the word and the possibility that staff may have seen it and not taken action. B was very appreciative of Colletta Maryland alerting the custodial staff, [place], and Don and immediately getting the writing taken down.   ROS:  Pt denies SOB, abd pain, CP, N/V/C/D, and vision changes  Objective:   No results found. No results for input(s): WBC, HGB, HCT, PLT in the last 72 hours. No results for input(s): NA, K, CL, CO2, GLUCOSE, BUN, CREATININE, CALCIUM in the last 72 hours.  Intake/Output Summary (Last 24 hours) at 06/24/2019 1235 Last data filed at 06/24/2019 0757 Gross per 24 hour  Intake 240 ml  Output 550 ml  Net -310 ml     Physical Exam: Vital Signs Blood pressure 115/69, pulse 75, temperature 98.1 F (36.7 C), resp. rate 18, height 5' 10.5" (1.791 m), weight 70.6 kg, SpO2 99 %. Constitutional: No distress . Vital signs reviewed. Sitting up in manual w/c in room NAD HEENT: EOMI, oral membranes moist Neck: supple Cardiovascular: RRR  Respiratory/Chest: CTA b/l GI/Abdomen: Soft, NT, ND, (+)BS  Ext: no clubbing, cyanosis, or edema Psych: appropriate, talkative, personable, in good spirits Skin: Warm and dry.  Intact. Musc: No edema in extremities.  No tenderness in extremities. Neuro: Alert Facial muscles intact Motor:  RUE- 4+/5 in deltoid, biceps, triceps, WE, grip and finger abd LUE- 4+/5 in same muscles except 3-/5 in finger abd RLE_ HF 4+/5, KE 5-/5, DF and PF 5-/5 LLE- HF 4/5, KE 5-/5, DF and PF 5-/5 4-5 beats clonus RLE , 2-3 beats LLE still MAS right knee 2, left knee 1+. Decreased sensation along dorsum of left  foot and left calf.  Functional mobility: transfers with supervision, MinA for shaving.   Assessment/Plan: 1. 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  Physiatrist is providing close team supervision and 24 hour management of active medical problems listed below.  Physiatrist and rehab team continue to assess barriers to discharge/monitor patient progress toward functional and medical goals  Care Tool:  Bathing    Body parts bathed by patient: Right arm, Left arm, Chest, Abdomen, Front perineal area, Right upper leg, Left upper leg, Right lower leg, Left lower leg, Face, Buttocks   Body parts bathed by helper: Buttocks     Bathing assist Assist Level: Supervision/Verbal cueing     Upper Body Dressing/Undressing Upper body dressing   What is the patient wearing?: Dress, Pull over shirt    Upper body assist Assist Level: Set up assist    Lower Body Dressing/Undressing Lower body dressing      What is the patient wearing?: Underwear/pull up, Skirt     Lower body assist Assist for lower body dressing: Supervision/Verbal cueing     Toileting Toileting    Toileting assist Assist for toileting: Supervision/Verbal cueing Assistive Device Comment: urinal   Transfers Chair/bed transfer  Transfers assist     Chair/bed transfer assist level: Supervision/Verbal cueing Chair/bed transfer assistive device: Programmer, multimedia   Ambulation assist      Assist level: Contact  Guard/Touching assist Assistive device: Walker-rolling Max distance: 200 feet   Walk 10 feet activity   Assist     Assist level: Contact Guard/Touching assist Assistive device: Walker-rolling   Walk 50 feet activity   Assist    Assist level: Contact Guard/Touching assist Assistive device: Walker-rolling    Walk 150 feet  activity   Assist    Assist level: Contact Guard/Touching assist Assistive device: Walker-rolling    Walk 10 feet on uneven surface  activity   Assist Walk 10 feet on uneven surfaces activity did not occur: Safety/medical concerns   Assist level: Contact Guard/Touching assist Assistive device: Aeronautical engineer Will patient use wheelchair at discharge?: Yes Type of Wheelchair: Manual    Wheelchair assist level: Independent Max wheelchair distance: 150 feet    Wheelchair 50 feet with 2 turns activity    Assist        Assist Level: Independent   Wheelchair 150 feet activity     Assist      Assist Level: Independent   Blood pressure 115/69, pulse 75, temperature 98.1 F (36.7 C), resp. rate 18, height 5' 10.5" (1.791 m), weight 70.6 kg, SpO2 99 %.  Medical Problem List and Plan: 1.  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  Status post C6-7 laminoplasty resection of spinal cord tumor 06/01/2019.  Decadron taper completed  Continue CIR  Transferring with supervision, MinA for shaving 2.  Antithrombotics: -DVT/anticoagulation: Lovenox.  Will need for 2-3 months depending on walking ability  Vascular study negative for DVT             -antiplatelet therapy: N/A 3. Pain Management: Neurontin 600 mg 3 times daily, Lidoderm patch as directed, Valium 10 mg every 6 hours as needed muscle spasms, Robaxin 750 mg every 6 hours as needed muscle spasms, oxycodone as needed  Topamax 25 daily started on 6/6 for migraines per patient request  6/8- change to QHS so doesn't sedate pt. Change lidoderm to 8am to 8pm- changed.   6/15- pain is improved.   6/16: Pain worst in left 3rd digit- diclofenac gel prescribed.   6/19: The diclofenac gel has provided some relief.  4. Mood: - don't think pt needs meds- will add if needed  6/9- will add Celexa 20 mg daily for  affect/mood  6/12- no complaints of side effects, pt very appreciative that it was started  6/16: has noted benefits from medication.   6/17: appears to be well controlled.   6/18: well controlled             -antipsychotic agents: N/A 5. Neuropsych: This patient is capable of making decisions on their own behalf. 6. Skin/Wound Care: Routine skin checks 7. Fluids/Electrolytes/Nutrition: Routine in and outs  BMP within acceptable range on 6/4  6/8- looks good 6/7- no issues 8.  Neurogenic bowel.    Added suppository nightly and digital stimulation of rectum to improve constipation and bowel movements- if recovers ability to have BMs on their own, will stop.   6/10- <1 hour til results after bowel program last night- better   6/13- BM <1 hr after suppository- patterns improving, continent  6/14- NEEDS dig stim- explained that's why not having results fast  6/16: Had BM last night with suppository. Dig stim was performed.   6/18: Had BM after dig stim last night but did not feel they had complete evacuation. Increase Miralax  to BID.  6/19: Had BM less than an hour after last night's bowel program, but still felt incomplete evacuation. Increase Miralax to TID. Discussed high fiber diet. Patient drinks 8 glasses of coffee and tea at home. Advised that they may need less stool softeners as a result at home and should increase consumption of coffee and tea at a slow pace to avoid incontinence. Will likely need to wean off stool softeners as consumption of these beverages increase.  9. Minimal PVRs- will not reorder PVRs- voiding OK-  10. Spasticity- explained it's normal that it gets worse over time- is very mild right now- will monitor  6/7- on valium 10 mg q6 hours prn for spasms  6/11 baclofen added, 5mg  tid  6/13 pt wants to treat spasms further. I told them we can increase baclofen to 10mg  TID, but we will need to watch for sedation, hypotension, etc. Pt understands and wants to try.   6/14- no  increased sedation- no side effects so far- not sure if helping yet  6/15: Patient says they are experiencing benefit without sedation.  6/17: received one valium this morning. 11. Leukocytosis  WBCs 13.8 on 6/4, labs ordered for tomorrow  6/7- WBC down to 11.7k- doing well  Afebrile 12.  Transaminitis  LFTs elevated on 6/4, labs ordered for tomorrow  6/7- actually slightly higher- 58/195- from 48/188- not on any meds that would cause it- will follow 13. Tachycardia  6/7- Will add metoprolol 12.5 mg BID for fast heart rate  6/9- Heart rate ~ 100-  6/13 HR in 70's to 80's. Given  6/18: well controlled  14. Presyncopal episode after bm  -likely vasovagal along with myelopathy, med effects etc  -discussed the importance of sitting down if experiences symptoms  -avoid excessive valsalva with emptying  -adequate fluid intake  -further med adjustments if needed.   -6/15: no more similar episodes. 15. Incident 6/19: Upon my entry to room, patient was tearful after finding the word "fag" written on the wall of elevator 1057 at 8:40am when they were going downstairs with occupational therapist Mariane Masters. Patient has been crying on and off all morning as was very disturbed by seeing the word and the possibility that staff may have seen it and not taken action. B was very appreciative of Colletta Maryland alerting the custodial staff, [place], and Don and immediately getting the writing taken down. I provided emotional support to patient and emailed Dr. Naaman Plummer and Timmothy Sours about this issue.   >30 minutes spend in counseling and care of patient, including emotional support regarding disturbing incident, discussion of bowel program and sexual function, discussion of home health service acceptance and communication of this to their father, and in assessment and discussion of spasticity.   LOS: 16 days A FACE TO FACE EVALUATION WAS PERFORMED  Clide Deutscher Rina Adney 06/24/2019, 12:35 PM

## 2019-06-24 NOTE — Progress Notes (Addendum)
Bowel program initiated after patient's family left. One round of dig stim and suppository placed. Patient on toilet, having BM upon entering room to complete bowel program. Medium sized BM. Two additional rounds of dig stim performed, with small smear of stool expelled. Pt requested to attempt to finish expelling stool on toilet, stating "I think I can get this last little bit out." Pt reported having additional small BM following dig stim.

## 2019-06-25 ENCOUNTER — Inpatient Hospital Stay (HOSPITAL_COMMUNITY): Payer: BC Managed Care – PPO

## 2019-06-25 MED ORDER — BACLOFEN 5 MG HALF TABLET
15.0000 mg | ORAL_TABLET | Freq: Three times a day (TID) | ORAL | Status: DC
  Administered 2019-06-25 – 2019-06-26 (×3): 15 mg via ORAL
  Filled 2019-06-25 (×2): qty 1

## 2019-06-25 NOTE — Progress Notes (Signed)
Occupational Therapy Session Note  Patient Details  Name: Jacob Lambert MRN: [id number] Date of Birth: Apr 24, 1985  Today's Date: 06/25/2019 OT Individual Time: 1515-1600 OT Individual Time Calculation (min): 45 min    Short Term Goals: Week 1:  OT Short Term Goal 1 (Week 1): Pt will transfer wiht S overall consistently with LRAD to toilet OT Short Term Goal 1 - Progress (Week 1): Progressing toward goal OT Short Term Goal 2 (Week 1): Pt will step over shower edge wiht CGA using LRAD OT Short Term Goal 2 - Progress (Week 1): Progressing toward goal OT Short Term Goal 3 (Week 1): Pt will complete LB dressing with S OT Short Term Goal 3 - Progress (Week 1): Progressing toward goal OT Short Term Goal 4 (Week 1): Pt will complete UB dressing wiht set up OT Short Term Goal 4 - Progress (Week 1): Met  Skilled Therapeutic Interventions/Progress Updates:    1:1. Pt received in bed with RN supervisor present having conversation about the events of yesterday. Pt agreeable to OT with no pain reported. Pt completes dressing at bed level and sit to stand EOB with set up. Pt transfers to w/c with MOD I and dons leg rests with setup. W/c propulsion to outside courtyard for bimanual strengthening as well as up/down steep outside ramp. Throughout session pt and OT have dynamic conversation about coping, loss, processing events of the weekend as well as disability in general. Pt able to acknowledge to new community they now belong to, but continue to feel sorrow for how things will no longer be the same. Exited session with pt seated bed, exit alarm on and parents entering room  Therapy Documentation Precautions:  Precautions Precautions: Fall Precaution Booklet Issued: No Precaution Comments: watch BP and HR Restrictions Weight Bearing Restrictions: No General:   Vital Signs:  Pain:   ADL: ADL Eating: Supervision/safety Grooming: Minimal assistance Where Assessed-Grooming: Sitting at sink Upper  Body Bathing: Supervision/safety Where Assessed-Lower Body Bathing: Edge of bed Upper Body Dressing: Maximal assistance (shoes only) Vision   Perception    Praxis   Exercises:   Other Treatments:     Therapy/Group: Individual Therapy  [name] 06/25/2019, 12:23 PM

## 2019-06-25 NOTE — Progress Notes (Signed)
[place] PHYSICAL MEDICINE & REHABILITATION PROGRESS NOTE   Subjective/Complaints: Jacob Lambert is understandably still very upset about elevator incident yesterday as well as that they feel not enough action has been taken regarding how they were cared for on prior unit. Would like to try higher dose of Baclofen for spasticity- not feeling sleepy from medication. Evacuated 98% yesterday following dig stim and suppository.   ROS:  Pt denies SOB, abd pain, CP, N/V/C/D, and vision changes  Objective:   No results found. No results for input(s): WBC, HGB, HCT, PLT in the last 72 hours. No results for input(s): NA, K, CL, CO2, GLUCOSE, BUN, CREATININE, CALCIUM in the last 72 hours.  Intake/Output Summary (Last 24 hours) at 06/25/2019 1555 Last data filed at 06/25/2019 0800 Gross per 24 hour  Intake 376 ml  Output 600 ml  Net -224 ml     Physical Exam: Vital Signs Blood pressure 107/75, pulse 81, temperature 98.4 F (36.9 C), resp. rate 15, height 5' 10.5" (1.791 m), weight 70.6 kg, SpO2 98 %. General: Alert and oriented x 3, No apparent distress HEENT: Head is normocephalic, atraumatic, PERRLA, EOMI, sclera anicteric, oral mucosa pink and moist, dentition intact, ext ear canals clear,  Neck: Supple without JVD or lymphadenopathy Heart: Reg rate and rhythm. No murmurs rubs or gallops Chest: CTA bilaterally without wheezes, rales, or rhonchi; no distress Abdomen: Soft, non-tender, non-distended, bowel sounds positive. Skin: Warm and dry.  Intact. Musc: No edema in extremities.  No tenderness in extremities. Neuro: Alert Facial muscles intact Motor:  RUE: 4+/5 in deltoid, biceps, triceps, WE, grip and finger abd LUE:4+/5 in same muscles except 3-/5 in finger abd RLE: HF 4+/5, KE 5-/5, DF and PF 5-/5 LLE: HF 4/5, KE 5-/5, DF and PF 5-/5 4-5 beats clonus RLE , 2-3 beats LLE still MAS right knee 2, left knee 1+. Decreased sensation along dorsum of left foot and left calf.  Functional  mobility: transfers with supervision, MinA for shaving. Psych: emotional  Assessment/Plan: 1. 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  Physiatrist is providing close team supervision and 24 hour management of active medical problems listed below.  Physiatrist and rehab team continue to assess barriers to discharge/monitor patient progress toward functional and medical goals  Care Tool:  Bathing    Body parts bathed by patient: Right arm, Left arm, Chest, Abdomen, Front perineal area, Right upper leg, Left upper leg, Right lower leg, Left lower leg, Face, Buttocks   Body parts bathed by helper: Buttocks     Bathing assist Assist Level: Supervision/Verbal cueing     Upper Body Dressing/Undressing Upper body dressing   What is the patient wearing?: Dress, Pull over shirt    Upper body assist Assist Level: Set up assist    Lower Body Dressing/Undressing Lower body dressing      What is the patient wearing?: Underwear/pull up, Skirt     Lower body assist Assist for lower body dressing: Supervision/Verbal cueing     Toileting Toileting    Toileting assist Assist for toileting: Supervision/Verbal cueing Assistive Device Comment: urinal   Transfers Chair/bed transfer  Transfers assist     Chair/bed transfer assist level: Supervision/Verbal cueing Chair/bed transfer assistive device: Programmer, multimedia   Ambulation assist      Assist level: Contact Guard/Touching assist Assistive device: Walker-rolling Max distance: 200 feet   Walk  10 feet activity   Assist     Assist level: Contact Guard/Touching assist Assistive device: Walker-rolling   Walk 50 feet activity   Assist    Assist level: Contact Guard/Touching assist Assistive device: Walker-rolling    Walk 150 feet activity   Assist     Assist level: Contact Guard/Touching assist Assistive device: Walker-rolling    Walk 10 feet on uneven surface  activity   Assist Walk 10 feet on uneven surfaces activity did not occur: Safety/medical concerns   Assist level: Contact Guard/Touching assist Assistive device: Walker-rolling   Wheelchair     Assist Will patient use wheelchair at discharge?: Yes Type of Wheelchair: Manual    Wheelchair assist level: Independent Max wheelchair distance: 150 feet    Wheelchair 50 feet with 2 turns activity    Assist        Assist Level: Independent   Wheelchair 150 feet activity     Assist      Assist Level: Independent   Blood pressure 107/75, pulse 81, temperature 98.4 F (36.9 C), resp. rate 15, height 5' 10.5" (1.791 m), weight 70.6 kg, SpO2 98 %.  Medical Problem List and Plan: 1.  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  Status post C6-7 laminoplasty resection of spinal cord tumor 06/01/2019.  Decadron taper completed  Continue CIR  Transferring with supervision, MinA for shaving 2.  Antithrombotics: -DVT/anticoagulation: Lovenox.  Will need for 2-3 months depending on walking ability  Vascular study negative for DVT             -antiplatelet therapy: N/A 3. Pain Management: Neurontin 600 mg 3 times daily, Lidoderm patch as directed, Valium 10 mg every 6 hours as needed muscle spasms, Robaxin 750 mg every 6 hours as needed muscle spasms, oxycodone as needed  Topamax 25 daily started on 6/6 for migraines per patient request  6/8- change to QHS so doesn't sedate pt. Change lidoderm to 8am to 8pm- changed.   6/15- pain is improved.   6/16: Pain worst in left 3rd digit- diclofenac gel prescribed.   6/19: The diclofenac gel has provided some relief.  4. Mood: - don't think pt needs meds- will add if needed  6/9- will add Celexa 20 mg daily for affect/mood  6/12- no  complaints of side effects, pt very appreciative that it was started  6/16: has noted benefits from medication.   6/17: appears to be well controlled.   6/18: well controlled             -antipsychotic agents: N/A 5. Neuropsych: This patient is capable of making decisions on their own behalf. 6. Skin/Wound Care: Routine skin checks 7. Fluids/Electrolytes/Nutrition: Routine in and outs  BMP within acceptable range on 6/4  6/8- looks good 6/7- no issues 8.  Neurogenic bowel.    Added suppository nightly and digital stimulation of rectum to improve constipation and bowel movements- if recovers ability to have BMs on their own, will stop.   6/10- <1 hour til results after bowel program last night- better   6/13- BM <1 hr after suppository- patterns improving, continent  6/14- NEEDS dig stim- explained that's why not having results fast  6/16: Had BM last night with suppository. Dig stim was performed.   6/18: Had BM after dig stim last night but did not feel they had complete evacuation. Increase Miralax to BID.  6/19: Had BM less than an hour after last  night's bowel program, but still felt incomplete evacuation. Increase Miralax to TID. Discussed high fiber diet. Patient drinks 8 glasses of coffee and tea at home. Advised that they may need less stool softeners as a result at home and should increase consumption of coffee and tea at a slow pace to avoid incontinence. Will likely need to wean off stool softeners as consumption of these beverages increase.   6/20: Had 97% evacuation last night after dig stim and suppository.  9. Minimal PVRs- will not reorder PVRs- voiding OK-  10. Spasticity- explained it's normal that it gets worse over time- is very mild right now- will monitor  6/7- on valium 10 mg q6 hours prn for spasms  6/11 baclofen added, 5mg  tid  6/13 pt wants to treat spasms further. I told them we can increase baclofen to 10mg  TID, but we will need to watch for sedation, hypotension,  etc. Pt understands and wants to try.   6/14- no increased sedation- no side effects so far- not sure if helping yet  6/15: Patient says they are experiencing benefit without sedation.  6/17: received one valium this morning.  6/20: Increase baclofen to 15mg  TID starting at midnight as per patient's preference and sleep schedule.  11. Leukocytosis  WBCs 13.8 on 6/4, labs ordered for tomorrow  6/7- WBC down to 11.7k- doing well  Afebrile 12.  Transaminitis  LFTs elevated on 6/4, labs ordered for tomorrow  6/7- actually slightly higher- 58/195- from 48/188- not on any meds that would cause it- will follow 13. Tachycardia  6/7- Will add metoprolol 12.5 mg BID for fast heart rate  6/9- Heart rate ~ 100-  6/13 HR in 70's to 80's. Given  6/18: well controlled  14. Presyncopal episode after bm  -likely vasovagal along with myelopathy, med effects etc  -discussed the importance of sitting down if experiences symptoms  -avoid excessive valsalva with emptying  -adequate fluid intake  -further med adjustments if needed.   -6/15: no more similar episodes. 15. Incident 6/19: Upon my entry to room, patient was tearful after finding the word "fag" written on the wall of elevator 1057 at 8:40am when they were going downstairs with occupational therapist Mariane Masters. Patient has been crying on and off all morning as was very disturbed by seeing the word and the possibility that staff may have seen it and not taken action. Jacob Lambert was very appreciative of Colletta [place] alerting the custodial staff, [place], and Don and immediately getting the writing taken down. I provided emotional support to patient and emailed Dr. Naaman Plummer and Timmothy Sours about this issue.   6/20: Patient is still understandably very upset about this incident and also that no [facility] executive has followed up with them regarding complaints of care in progressive unit. Hillary RN and I listened to patient's concerns and I have emailed concerns to Dr. Naaman Plummer,  Timmothy Sours, and Dr. Dagoberto Ligas.   >1 hour spent in counseling and care of patient, including emotional support regarding disturbing incident, discussion of bowel program and sexual function, discussion and assessment of spasticity.    LOS: 17 days A FACE TO FACE EVALUATION WAS PERFORMED  Clide Deutscher Carloyn Lahue 06/25/2019, 3:55 PM

## 2019-06-26 ENCOUNTER — Inpatient Hospital Stay (HOSPITAL_COMMUNITY): Payer: BC Managed Care – PPO

## 2019-06-26 ENCOUNTER — Inpatient Hospital Stay (HOSPITAL_COMMUNITY): Payer: BC Managed Care – PPO | Admitting: Physical Therapy

## 2019-06-26 ENCOUNTER — Encounter: Payer: Self-pay | Admitting: Radiation Oncology

## 2019-06-26 MED ORDER — BACLOFEN 5 MG HALF TABLET
15.0000 mg | ORAL_TABLET | Freq: Four times a day (QID) | ORAL | Status: DC
  Administered 2019-06-26 – 2019-06-28 (×9): 15 mg via ORAL
  Filled 2019-06-26 (×9): qty 1

## 2019-06-26 NOTE — Plan of Care (Signed)
  Problem: Consults Goal: RH SPINAL CORD INJURY PATIENT EDUCATION Description:  See Patient Education module for education specifics.  Outcome: Progressing   Problem: SCI BOWEL ELIMINATION Goal: RH STG MANAGE BOWEL WITH ASSISTANCE Description: STG Manage Bowel with min Assistance. Outcome: Progressing Goal: RH STG MANAGE BOWEL W/EQUIPMENT W/ASSISTANCE Description: STG Manage Bowel With Equipment With min Assistance Outcome: Progressing Goal: RH STG SCI MANAGE BOWEL PROGRAM W/ASSIST OR AS APPROPRIATE Description: STG SCI Manage bowel program w/min assist or as appropriate. Outcome: Progressing   Problem: SCI BLADDER ELIMINATION Goal: RH STG MANAGE BLADDER WITH EQUIPMENT WITH ASSISTANCE Description: STG Manage Bladder With Equipment With mod I Assistance Outcome: Progressing   Problem: RH SKIN INTEGRITY Goal: RH STG SKIN FREE OF INFECTION/BREAKDOWN Outcome: Progressing Goal: RH STG MAINTAIN SKIN INTEGRITY WITH ASSISTANCE Description: STG Maintain Skin Integrity With min Assistance. Outcome: Progressing   Problem: RH SAFETY Goal: RH STG ADHERE TO SAFETY PRECAUTIONS W/ASSISTANCE/DEVICE Description: STG Adhere to Safety Precautions With reminders each shift Outcome: Progressing   Problem: RH PAIN MANAGEMENT Goal: RH STG PAIN MANAGED AT OR BELOW PT'S PAIN GOAL Description: STG pt's pain level less than 4 on scale of 1-10 Outcome: Progressing   Problem: RH KNOWLEDGE DEFICIT SCI Goal: RH STG INCREASE KNOWLEDGE OF SELF CARE AFTER SCI Description: Pt. will understand/demonstrate management of bowel program with minimal assistance.  Pt. will demonstrate understanding of pain management techniques  Pt. will verbalize understanding of prescribed medications Outcome: Progressing

## 2019-06-26 NOTE — Progress Notes (Signed)
[facility] PHYSICAL MEDICINE & REHABILITATION PROGRESS NOTE   Subjective/Complaints:  Pt reports still upset about elevator incident Saturday AM.   Also reports spasms really bother them in early AM.  Will need to change Baclofen- is on 15 mg TID (pt thought 10 mg TID)- will change AM dose to 0600 and increase to QID.   Also asking for Viagra for d/c med.  Noted dig stim done again yesterday/last night- completely emptied.    ROS:  Pt denies SOB, abd pain, CP, N/V/C/D, and vision changes  Objective:   No results found. No results for input(s): WBC, HGB, HCT, PLT in the last 72 hours. No results for input(s): NA, K, CL, CO2, GLUCOSE, BUN, CREATININE, CALCIUM in the last 72 hours.  Intake/Output Summary (Last 24 hours) at 06/26/2019 1904 Last data filed at 06/26/2019 1351 Gross per 24 hour  Intake 480 ml  Output 350 ml  Net 130 ml     Physical Exam: Vital Signs Blood pressure 115/66, pulse 65, temperature 98 F (36.7 C), resp. rate 18, height 5' 10.5" (1.791 m), weight 70.6 kg, SpO2 98 %. General: Alert and oriented x 3, No apparent distress; sitting up in bed; appropriate, nAD HEENT: conjugate gaze Heart: RRR Chest: CTA B/L Abdomen: soft, NT, ND, (+)BS Skin: Warm and dry.  Intact. Musc: No edema in extremities.  No tenderness in extremities. Neuro: Alert Facial muscles intact Motor:  RUE: 4+/5 in deltoid, biceps, triceps, WE, grip and finger abd LUE:4+/5 in same muscles except 3-/5 in finger abd RLE: HF 4+/5, KE 5-/5, DF and PF 5-/5 LLE: HF 4/5, KE 5-/5, DF and PF 5-/5 4-5 beats clonus RLE , 2-3 beats LLE still MAS right knee 2, left knee 1+. Decreased sensation along dorsum of left foot and left calf.  Functional mobility: transfers with supervision, MinA for shaving. Psych: emotional  Assessment/Plan: 1. 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  Physiatrist is providing close team supervision and 24 hour management of active medical problems listed below.  Physiatrist and rehab team continue to assess barriers to discharge/monitor patient progress toward functional and medical goals  Care Tool:  Bathing    Body parts bathed by patient: Right arm, Left arm, Chest, Abdomen, Front perineal area, Right upper leg, Left upper leg, Right lower leg, Left lower leg, Face, Buttocks   Body parts bathed by helper: Buttocks     Bathing assist Assist Level: Supervision/Verbal cueing     Upper Body Dressing/Undressing Upper body dressing   What is the patient wearing?: Dress, Pull over shirt    Upper body assist Assist Level: Set up assist    Lower Body Dressing/Undressing Lower body dressing      What is the patient wearing?: Underwear/pull up, Skirt     Lower body assist Assist for lower body dressing: Supervision/Verbal cueing     Toileting Toileting    Toileting assist Assist for toileting: Supervision/Verbal cueing Assistive Device Comment: urinal   Transfers Chair/bed transfer  Transfers assist     Chair/bed transfer assist level: Supervision/Verbal cueing Chair/bed transfer assistive device: Programmer, multimedia   Ambulation assist      Assist level: Contact Guard/Touching assist Assistive device: Walker-rolling Max distance: 150 feet   Walk 10 feet activity   Assist     Assist level: Contact Guard/Touching assist Assistive device: Walker-rolling   Walk 50  feet activity   Assist    Assist level: Contact Guard/Touching assist Assistive device: Walker-rolling    Walk 150 feet activity   Assist    Assist level: Contact Guard/Touching assist Assistive device: Walker-rolling    Walk 10 feet on uneven surface  activity   Assist Walk 10 feet on uneven surfaces activity did not occur: Safety/medical concerns   Assist level: Contact  Guard/Touching assist Assistive device: Walker-rolling   Wheelchair     Assist Will patient use wheelchair at discharge?: Yes Type of Wheelchair: Manual    Wheelchair assist level: Independent Max wheelchair distance: 150 feet    Wheelchair 50 feet with 2 turns activity    Assist        Assist Level: Independent   Wheelchair 150 feet activity     Assist      Assist Level: Independent   Blood pressure 115/66, pulse 65, temperature 98 F (36.7 C), resp. rate 18, height 5' 10.5" (1.791 m), weight 70.6 kg, SpO2 98 %.  Medical Problem List and Plan: 1.  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  Status post C6-7 laminoplasty resection of spinal cord tumor 06/01/2019.  Decadron taper completed  Continue CIR  Transferring with supervision, MinA for shaving 2.  Antithrombotics: -DVT/anticoagulation: Lovenox.  Will need for 2-3 months depending on walking ability  Vascular study negative for DVT             -antiplatelet therapy: N/A 3. Pain Management: Neurontin 600 mg 3 times daily, Lidoderm patch as directed, Valium 10 mg every 6 hours as needed muscle spasms, Robaxin 750 mg every 6 hours as needed muscle spasms, oxycodone as needed  Topamax 25 daily started on 6/6 for migraines per patient request  6/8- change to QHS so doesn't sedate pt. Change lidoderm to 8am to 8pm- changed.   6/15- pain is improved.   6/16: Pain worst in left 3rd digit- diclofenac gel prescribed.   6/19: The diclofenac gel has provided some relief.   6/21- pain doing pretty well 4. Mood: - don't think pt needs meds- will add if needed  6/9- will add Celexa 20 mg daily for affect/mood  6/12- no complaints of side effects, pt very appreciative that it was started  6/16: has noted benefits from medication.   6/17: appears to be well controlled.   6/18: well controlled             -antipsychotic agents: N/A 5.  Neuropsych: This patient is capable of making decisions on their own behalf. 6. Skin/Wound Care: Routine skin checks 7. Fluids/Electrolytes/Nutrition: Routine in and outs  BMP within acceptable range on 6/4  6/8- looks good 6/7- no issues 8.  Neurogenic bowel.    Added suppository nightly and digital stimulation of rectum to improve constipation and bowel movements- if recovers ability to have BMs on their own, will stop.   6/10- <1 hour til results after bowel program last night- better   6/13- BM <1 hr after suppository- patterns improving, continent  6/14- NEEDS dig stim- explained that's why not having results fast  6/16: Had BM last night with suppository. Dig stim was performed.   6/18: Had BM after dig stim last night but did not feel they had complete evacuation. Increase Miralax to BID.  6/19: Had BM less than an hour after last night's bowel program, but still felt incomplete evacuation. Increase Miralax to TID. Discussed high fiber diet.  Patient drinks 8 glasses of coffee and tea at home. Advised that they may need less stool softeners as a result at home and should increase consumption of coffee and tea at a slow pace to avoid incontinence. Will likely need to wean off stool softeners as consumption of these beverages increase.   6/20: Had 97% evacuation last night after dig stim and suppository.   6/21- evacuated completely last night after dig stim.  9. Minimal PVRs- will not reorder PVRs- voiding OK-  10. Spasticity- explained it's normal that it gets worse over time- is very mild right now- will monitor  6/7- on valium 10 mg q6 hours prn for spasms  6/11 baclofen added, 5mg  tid  6/13 pt wants to treat spasms further. I told them we can increase baclofen to 10mg  TID, but we will need to watch for sedation, hypotension, etc. Pt understands and wants to try.   6/14- no increased sedation- no side effects so far- not sure if helping yet  6/15: Patient says they are experiencing  benefit without sedation.  6/17: received one valium this morning.  6/20: Increase baclofen to 15mg  TID starting at midnight as per patient's preference and sleep schedule.   6/21- will change baclofen to 15 mg QID and start at 0600 since spasms when they wake up are the worst time of day.  11. Leukocytosis  WBCs 13.8 on 6/4, labs ordered for tomorrow  6/7- WBC down to 11.7k- doing well  Afebrile 12.  Transaminitis  LFTs elevated on 6/4, labs ordered for tomorrow  6/7- actually slightly higher- 58/195- from 48/188- not on any meds that would cause it- will follow 13. Tachycardia  6/7- Will add metoprolol 12.5 mg BID for fast heart rate  6/9- Heart rate ~ 100-  6/13 HR in 70's to 80's. Given  6/18: well controlled  14. Presyncopal episode after bm  -likely vasovagal along with myelopathy, med effects etc  -discussed the importance of sitting down if experiences symptoms  -avoid excessive valsalva with emptying  -adequate fluid intake  -further med adjustments if needed.   -6/15: no more similar episodes. 15. Incident 6/19: Upon my entry to room, patient was tearful after finding the word "fag" written on the wall of elevator 1057 at 8:40am when they were going downstairs with occupational therapist Mariane Masters. Patient has been crying on and off all morning as was very disturbed by seeing the word and the possibility that staff may have seen it and not taken action. B was very appreciative of Colletta Maryland alerting the custodial staff, [facility] [facility], and Don and immediately getting the writing taken down. I provided emotional support to patient and emailed Dr. Naaman Plummer and Timmothy Sours about this issue.   6/20: Patient is still understandably very upset about this incident and also that no [facility] executive has followed up with them regarding complaints of care in progressive unit. Hillary RN and I listened to patient's concerns and I have emailed concerns to Dr. Naaman Plummer, Timmothy Sours, and Dr. Dagoberto Ligas.   6/21- went to  email additional staff, but they had been added to email list, will atttmept to reach [facility].    LOS: 18 days A FACE TO FACE EVALUATION WAS PERFORMED  Jacob Lambert 06/26/2019, 7:04 PM

## 2019-06-26 NOTE — Progress Notes (Addendum)
Occupational Therapy Session Note  Patient Details  Name: Jacob Lambert MRN: [id number] Date of Birth: March 23, 1985  Today's Date: 06/26/2019 OT Individual Time: 1345-1445 OT Individual Time Calculation (min): 60 min    Short Term Goals: Week 2:  OT Short Term Goal 1 (Week 2): Pt will transfer wiht S overall consistently with LRAD to toilet OT Short Term Goal 1 - Progress (Week 2): Met OT Short Term Goal 2 (Week 2): Pt will step over shower edge wiht CGA using LRAD OT Short Term Goal 2 - Progress (Week 2): Met OT Short Term Goal 3 (Week 2): Pt will complete LB dressing with S OT Short Term Goal 3 - Progress (Week 2): Met Week 3:  OT Short Term Goal 1 (Week 3): STG=LTG secondary to ELOS  Skilled Therapeutic Interventions/Progress Updates:    OT intervention with practicing kitchen tasks and discharge planning. Pt propelled w/c to ADL kitchen and practiced cutting food items while standing at counter. Recommended bar stool to sit on. Pt proficient with using RUE for cutting and stabilizing item with LUE. Pt practiced pour liquid from large coffee pot.  Pt will needs to use RUE to accomplish task.  Pt able to use LUE for holding coffee cup with handle and supported by RUE. Pt practiced removing items from microwave at upper cabinet height.  Pt with good safety awareness. Pt returned to room and transferred back to bed. Discussed questions to be asked of hotels/restaurants, etc regarding accessibility. Will provide sample questions tomorrow. Pt remained in bed with all needs within reach and bed alarm activated.   Therapy Documentation Precautions:  Precautions Precautions: Fall Precaution Booklet Issued: No Precaution Comments: watch BP and HR Restrictions Weight Bearing Restrictions: No Pain:  Pt denies pain this afternoon   Therapy/Group: Individual Therapy  [name] 06/26/2019, 3:18 PM

## 2019-06-26 NOTE — Progress Notes (Signed)
Occupational Therapy Session Note  Patient Details  Name: Jacob Lambert MRN: [id number] Date of Birth: March 16, 1985  Today's Date: 06/26/2019 OT Individual Time: 0700-0800 OT Individual Time Calculation (min): 60 min    Short Term Goals: Week 2:  OT Short Term Goal 1 (Week 2): Pt will transfer wiht S overall consistently with LRAD to toilet OT Short Term Goal 1 - Progress (Week 2): Met OT Short Term Goal 2 (Week 2): Pt will step over shower edge wiht CGA using LRAD OT Short Term Goal 2 - Progress (Week 2): Met OT Short Term Goal 3 (Week 2): Pt will complete LB dressing with S OT Short Term Goal 3 - Progress (Week 2): Met  Skilled Therapeutic Interventions/Progress Updates:    Pt resting in bed upon arrival.  Pt remains, understandably, upset regarding events of Saturday and [name]'s response/lack of response.  Safe environment provided for B to talk through their emotions and concerns. Pt sat EOB and transferred to w/c at mod I and propelled to day room for BLE therex on NuStep-7 mins at load 6 and SPM of 65. Pt reports L kneed feels "wobbly" but otherwise "good."  Pt propelled w/c in unit and returned to room. Discussed pt's progress during admission in rehab. They transferred to EOB to eat breakfast. All needs within reach.     Therapy Documentation Precautions:  Precautions Precautions: Fall Precaution Booklet Issued: No Precaution Comments: watch BP and HR Restrictions Weight Bearing Restrictions: No Pain: Pt c/o ongoing "burning" sensation in 3rd digit L hand; topical cream applied by pt Therapy/Group: Individual Therapy  [name] 06/26/2019, 8:46 AM

## 2019-06-26 NOTE — Discharge Instructions (Signed)
Inpatient Rehab Discharge Instructions  Jacob Lambert Discharge date and time: No discharge date for patient encounter.   Activities/Precautions/ Functional Status: Activity: activity as tolerated Diet: regular diet Wound Care: keep wound clean and dry Functional status:  ___ No restrictions     ___ Walk up steps independently ___ 24/7 supervision/assistance   ___ Walk up steps with assistance ___ Intermittent supervision/assistance  ___ Bathe/dress independently ___ Walk with walker     _x__ Bathe/dress with assistance ___ Walk Independently    ___ Shower independently ___ Walk with assistance    ___ Shower with assistance ___ No alcohol     ___ Return to work/school ________    COMMUNITY REFERRALS UPON DISCHARGE:    Home Health:   PT     OT                 Agency: [facility] Phone: [phone]  *Please expect follow-up 2-3 days from discharge to schedule your home visit. If you have not received follow-up, be sure to contact the branch directly.*  Medical Equipment/Items Ordered: wheelchair                                                                                                               Agency/Supplier: [facility] [phone]   GENERAL COMMUNITY RESOURCES FOR PATIENT/FAMILY: New patient appointment on Tuesday, July 13 at 9:20am with Kathe Becton, [address] [address] [place], [place] [address] [phone]   Special Instructions: No driving smoking or alcohol   My questions have been answered and I understand these instructions. I will adhere to these goals and the provided educational materials after my discharge from the hospital.  Patient/Caregiver Signature _______________________________ Date __________  Clinician Signature _______________________________________ Date __________  Please bring this form and your medication list with you to all your follow-up doctor's appointments.

## 2019-06-26 NOTE — Progress Notes (Addendum)
Physical Therapy Session Note  Patient Details  Name: Jacob Lambert MRN: [id number] Date of Birth: [birth year]  Today's Date: 06/26/2019 PT Individual Time: 1100-1200; 1500-1530 PT Individual Time Calculation (min): 60 min; 30 min   Short Term Goals: Week 3:  PT Short Term Goal 1 (Week 3): = LTG d/t ELOS  Skilled Therapeutic Interventions/Progress Updates:  Session 1: Pt received supine in bed. Pt visibly upset with regards to incident they experienced this past weekend in the elevator. Pt voicing their frustration with the lack of accountability and response to the incident with regards to hospital leadership. Listened to pt concerns and provided emotional support to the patient. Agreeable to therapy. Denies any pain. TED hose in place to BLE. Pt is independent with bed mobility. Pt assisted with donning/doffing shoes for time conservation. Sit <> stand performed with RW and supervision throughout session. Pt given handout of HEP as well as strips of orange and green theraband to take home. Reviewed the correct performance of each exercise on HEP before pt return demonstrated them back to therapist: SLR 1x10 each leg, clamshells 1x10 each side (pt instructed in use of theraband to increase difficulty of clamshell), bridges 1x10, squats with RW 1x10, standing marches with RW 1x10 each leg. Overall pt performed these exercises at a supervision level, though they did noticeably fatigue by the end of the session requiring min A for standing marches. Occasional verbal and tactile cues needed for correct performance of exercise. Pt aware that they need a chair or bed behind them while performing exercises with RW for increased safety. Pt left semi-reclined in bed with all needs in reach. Pt given coffee. All questions answered.  Session 2: Pt received supine in bed. Agreeable to therapy. Denies any pain. TED hose in place to BLE. Pt is independent with bed mobility. Pt assisted with donning/doffing shoes for  time conservation. Squat pivot transfer performed with supervision throughout session. Pt independently propelled themself 150 feet to therapy gym with W/C. Performed step up/step downs on a 6 inch stair using B/L handrails with CGA 1x10 each leg. Seated on the mat table, pt performed ball toss/catch using the rebounder with intervals of 1 minute on 1 minute off x3. Ambulated 150 feet from therapy gym to room using RW with CGA. Pt left supine in bed with all needs in reach. All questions answered.  Therapy Documentation Precautions:  Precautions Precautions: Fall Precaution Booklet Issued: No Precaution Comments: watch BP and HR Restrictions Weight Bearing Restrictions: No  Therapy/Group: Individual Therapy  Nadeen Landau, SPT   Excell Seltzer, PT, DPT   06/26/2019, 12:29 PM

## 2019-06-26 NOTE — Progress Notes (Signed)
Histology and Location of Primary Cancer: Grade 2 ependymoma  Sites of Visceral and Bony Metastatic Disease: cervical spine   Location(s) of Symptomatic Metastases: C6-7  Past/Anticipated chemotherapy by medical oncology, if any: Patient has been consulted by Dr. Mickeal Skinner.  Pain on a scale of 0-10 is: Patient reports constant pain in his middle finger on his left hand, bilateral knees and surgical incision.    If Spine Met(s), symptoms, if any, include:  Bowel/Bladder retention or incontinence (please describe): Patient unable to have a natural bowel movement. Patient unable to obtain an erection without the aid of Viagra.  Numbness or weakness in extremities (please describe): Patient reports muscle weakness worse on the left side than right. Reports he has no sensation in his left foot. Reports "dead spots" across both legs but worse on the left. Reports taking baclofen to manage leg spasms.  Current Decadron regimen, if applicable: n/a  Ambulatory status? Walker? Wheelchair?: Wheelchair. Reports he is able to walk very short distances with a walker before his knees give out. Reports he mostly transfers in a wheelchair.   Reports abnormal spacing in his left hand. Explains his index and middle finger are "glued together and when separated my are trimmers."  Patient understands his incision is healing well without signs of infection.  Patient verbalized he will be discharged home tomorrow with OT and PT.  SAFETY ISSUES:  Prior radiation? denies  Pacemaker/ICD? denies  Possible current pregnancy? no, male patient  Is the patient on methotrexate? no  Current Complaints / other details:  34 year old nonbinary. Patient presented with progressive ataxia/leg weakness, sexual dysfunction, BUE clumsiness. Subtle gait impairment noted initially in February 2021. Patient s/p posterior cervical laminoplasty for an intramedullary spinal cord tumor on 06/01/2019.

## 2019-06-27 ENCOUNTER — Encounter: Payer: Self-pay | Admitting: Radiation Oncology

## 2019-06-27 ENCOUNTER — Institutional Professional Consult (permissible substitution): Payer: BC Managed Care – PPO | Admitting: Radiation Oncology

## 2019-06-27 ENCOUNTER — Encounter (HOSPITAL_COMMUNITY): Payer: BC Managed Care – PPO | Admitting: Psychology

## 2019-06-27 ENCOUNTER — Other Ambulatory Visit: Payer: Self-pay

## 2019-06-27 ENCOUNTER — Inpatient Hospital Stay (HOSPITAL_COMMUNITY): Payer: BC Managed Care – PPO

## 2019-06-27 ENCOUNTER — Ambulatory Visit
Admit: 2019-06-27 | Discharge: 2019-06-27 | Disposition: A | Discharge status: Home / Self Care | Payer: BC Managed Care – PPO | Attending: Radiation Oncology | Admitting: Radiation Oncology

## 2019-06-27 ENCOUNTER — Inpatient Hospital Stay (HOSPITAL_COMMUNITY): Payer: BC Managed Care – PPO | Admitting: Physical Therapy

## 2019-06-27 VITALS — Ht 71.0 in | Wt 155.0 lb

## 2019-06-27 DIAGNOSIS — C720 Malignant neoplasm of spinal cord: Secondary | ICD-10-CM

## 2019-06-27 DIAGNOSIS — D497 Neoplasm of unspecified behavior of endocrine glands and other parts of nervous system: Secondary | ICD-10-CM

## 2019-06-27 LAB — BASIC METABOLIC PANEL
Anion gap: 8 (ref 5–15)
BUN: 11 mg/dL (ref 6–20)
CO2: 27 mmol/L (ref 22–32)
Calcium: 9.5 mg/dL (ref 8.9–10.3)
Chloride: 105 mmol/L (ref 98–111)
Creatinine, Ser: 1.17 mg/dL (ref 0.61–1.24)
GFR calc Af Amer: >60 mL/min (ref >60)
GFR calc non Af Amer: >60 mL/min (ref >60)
Glucose, Bld: 93 mg/dL (ref 70–99)
Potassium: 4 mmol/L (ref 3.5–5.1)
Sodium: 140 mmol/L (ref 135–145)

## 2019-06-27 LAB — CBC WITH DIFFERENTIAL/PLATELET
Abs Immature Granulocytes: 0.02 10*3/uL (ref 0.00–0.07)
Basophils Absolute: 0 10*3/uL (ref 0.0–0.1)
Basophils Relative: 0 %
Eosinophils Absolute: 0.2 10*3/uL (ref 0.0–0.5)
Eosinophils Relative: 2 %
HCT: 41.8 % (ref 39.0–52.0)
Hemoglobin: 13.2 g/dL (ref 13.0–17.0)
Immature Granulocytes: 0 %
Lymphocytes Relative: 34 %
Lymphs Abs: 2.8 10*3/uL (ref 0.7–4.0)
MCH: 27.5 pg (ref 26.0–34.0)
MCHC: 31.6 g/dL (ref 30.0–36.0)
MCV: 87.1 fL (ref 80.0–100.0)
Monocytes Absolute: 0.6 10*3/uL (ref 0.1–1.0)
Monocytes Relative: 8 %
Neutro Abs: 4.7 10*3/uL (ref 1.7–7.7)
Neutrophils Relative %: 56 %
Platelets: 284 10*3/uL (ref 150–400)
RBC: 4.8 MIL/uL (ref 4.22–5.81)
RDW: 13.4 % (ref 11.5–15.5)
WBC: 8.4 10*3/uL (ref 4.0–10.5)
nRBC: 0 % (ref 0.0–0.2)

## 2019-06-27 MED ORDER — BACLOFEN 5 MG PO TABS: 15.0000 mg | ORAL_TABLET | Freq: Four times a day (QID) | ORAL | 1 refills | Status: DC

## 2019-06-27 MED ORDER — GABAPENTIN 300 MG PO CAPS: 600.0000 mg | ORAL_CAPSULE | Freq: Three times a day (TID) | ORAL | 1 refills | Status: DC

## 2019-06-27 MED ORDER — DIAZEPAM 10 MG PO TABS: 10.0000 mg | ORAL_TABLET | Freq: Four times a day (QID) | ORAL | 0 refills | Status: DC | PRN

## 2019-06-27 MED ORDER — DOCUSATE SODIUM 100 MG PO CAPS: 100.0000 mg | ORAL_CAPSULE | Freq: Three times a day (TID) | ORAL | 0 refills | Status: AC

## 2019-06-27 MED ORDER — TOPIRAMATE 25 MG PO TABS: 25.0000 mg | ORAL_TABLET | Freq: Every day | ORAL | 0 refills | Status: DC

## 2019-06-27 MED ORDER — OXYCODONE HCL 10 MG PO TABS: 10.0000 mg | ORAL_TABLET | ORAL | 0 refills | Status: DC | PRN

## 2019-06-27 MED ORDER — CITALOPRAM HYDROBROMIDE 20 MG PO TABS: 20.0000 mg | ORAL_TABLET | Freq: Every day | ORAL | 0 refills | Status: DC

## 2019-06-27 MED ORDER — SILDENAFIL CITRATE 50 MG PO TABS
50.0000 mg | ORAL_TABLET | ORAL | 1 refills | Status: DC | PRN
Start: 2019-06-27 — End: 2019-08-18

## 2019-06-27 MED ORDER — ACETAMINOPHEN 325 MG PO TABS: 650.0000 mg | ORAL_TABLET | ORAL | Status: AC | PRN

## 2019-06-27 MED ORDER — POLYETHYLENE GLYCOL 3350 17 G PO PACK: 17.0000 g | PACK | Freq: Three times a day (TID) | ORAL | 0 refills | Status: AC

## 2019-06-27 MED ORDER — METHOCARBAMOL 750 MG PO TABS: 750.0000 mg | ORAL_TABLET | Freq: Four times a day (QID) | ORAL | 0 refills | Status: DC | PRN

## 2019-06-27 MED ORDER — LIDOCAINE 5 % EX PTCH: 1.0000 | MEDICATED_PATCH | CUTANEOUS | 0 refills | Status: AC

## 2019-06-27 MED ORDER — ENOXAPARIN SODIUM 40 MG/0.4ML ~~LOC~~ SOLN: SUBCUTANEOUS | 1 refills | Status: DC

## 2019-06-27 MED ORDER — DICLOFENAC SODIUM 1 % EX GEL: 2.0000 g | Freq: Four times a day (QID) | CUTANEOUS | 1 refills | Status: DC

## 2019-06-27 NOTE — Progress Notes (Signed)
Radiation Oncology         [phone] ________________________________  Initial Inpatient Consultation - Conducted via Telephone due to current COVID-19 concerns for limiting patient exposure  Name: Jacob Lambert MRN: [id number]  Date: 06/27/2019  DOB: 1985/01/23  FM:[id number], No Pcp Per  Jadene Pierini, MD   REFERRING PHYSICIAN: Jadene Pierini, MD  DIAGNOSIS: 34 y.o. 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    ICD-10-CM   1. Cervical spinal cord ependymoma (HCC)  C72.0     HISTORY OF PRESENT ILLNESS: Jacob Lambert is a 34 y.o. nonbinary adult (pronouns: they/them) who initially presented to medical attention in 02/2019 with numbness, weakness, and reduced balance. Jacob Lambert was initially evaluated in [place], [place]. They noted gait issues and stiffness, as well as pain in their toes, in 02/2019. Their symptoms intensified on 04/06/2019 when they were unable to get out of bed due to weakness. They presented to urgent care but were subsequently sent to the ED. At that time, they also reported bilateral foot pain, leg tremors, and bilateral leg weakness and pain as well as some numbness. They had undergone a lumbar spine MRI on 04/12/2019, which was normal. Their symptoms soon got to the point of them needing to use a cane to ambulate around mid-April 2021.   They were seen by Dr. Epimenio Foot in neurology on 05/10/2019. Per his note, they reported issues going up and down stairs, several falls, progressive leg weakness, sexual dysfunction, and decreased visual acuity. They proceeded to MRI of the brain, cervical and thoracic spine on 05/23/2019. While the brain MRI was normal, the spine MRIs showed a multiloculated, cystic mass from C4-T2 expanding the spinal cord,  with associated hemorrhage and the most intense enhancing signal adjacent to C6-C7, eccentric to the right-felt most consistent with ependymoma versus astrocytoma.  They were seen in consultation with Dr. Barbaraann Cao  and Dr. Maurice Small.  Your case was presented at the multidisciplinary brain and spine conference with consensus recommendation to proceed with open biopsy and resection of the tumor which was performed on 06/01/2019 under the care of Dr. Maurice Small.  Final pathology revealed ependymoma, WHO grade II. The procedure was uncomplicated and a postop MRI demonstrated gross total resection of the enhancing portion of the mass. They were discharged to inpatient rehab on 06/08/2019 and the current plan is for discharge home from inpatient rehab tomorrow, 06/28/2019.  They are already set up for in-home PT and OT throughout the month of July 2021 in hopes of continuing to make progress with regaining strength and mobility.  Their case was again presented at the recent brain and spine multidisciplinary conference with consensus recommendation to proceed with adjuvant postop radiotherapy versus close monitoring/observation.  Therefore, they have kindly been referred today for discussion of potential radiation treatment options.  Given an overall poor experience throughout their recent hospitalization, there is also consideration for transferring their care to [facility]   PREVIOUS RADIATION THERAPY: No  PAST MEDICAL HISTORY:  Past Medical History:  Diagnosis Date  . Anxiety   . Depression   . Headache   . Pneumonia    "walking"      PAST SURGICAL HISTORY: Past Surgical History:  Procedure Laterality Date  . LAMINECTOMY N/A 06/01/2019   Procedure: Cervical six-seven Laminoplasty, biopsy and resection of intramedullary spinal cord tumor;  Surgeon: Jadene Pierini, MD;  [facility] OR;  Service: Neurosurgery;  Laterality: N/A;  . NO PAST SURGERIES      FAMILY  HISTORY: History reviewed. No pertinent family history.  SOCIAL HISTORY:  Social History   Socioeconomic History  . Marital status: Single    Spouse name: Not on file  . Number of children: Not on file  . Years of education: Not on file  .  Highest education level: Not on file  Occupational History  . Not on file  Tobacco Use  . Smoking status: Former Games developer  . Smokeless tobacco: Never Used  . Tobacco comment: none in years   Vaping Use  . Vaping Use: Never used  Substance and Sexual Activity  . Alcohol use: Yes    Comment: 2 bottles of wine per week, Maybe 4 - 8 shots aweek  . Drug use: Not Currently    Comment: CDB  . Sexual activity: Yes  Other Topics Concern  . Not on file  Social History Narrative   Lives with parents   Caffeine use: minimum- 3 cups per day (on average 6-10 cups per day)   Left handed    Social Determinants of Health   Financial Resource Strain:   . Difficulty of Paying Living Expenses:   Food Insecurity:   . Worried About Programme researcher, broadcasting/film/video in the Last Year:   . Barista in the Last Year:   Transportation Needs:   . Freight forwarder (Medical):   Marland Kitchen Lack of Transportation (Non-Medical):   Physical Activity:   . Days of Exercise per Week:   . Minutes of Exercise per Session:   Stress:   . Feeling of Stress :   Social Connections:   . Frequency of Communication with Friends and Family:   . Frequency of Social Gatherings with Friends and Family:   . Attends Religious Services:   . Active Member of Clubs or Organizations:   . Attends Banker Meetings:   Marland Kitchen Marital Status:   Intimate Partner Violence:   . Fear of Current or Ex-Partner:   . Emotionally Abused:   Marland Kitchen Physically Abused:   . Sexually Abused:     ALLERGIES: Cephalosporins and Other  MEDICATIONS:  No current facility-administered medications for this encounter.   No current outpatient medications on file.   Facility-Administered Medications Ordered in Other Encounters  Medication Dose Route Frequency Provider Last Rate Last Admin  . acetaminophen (TYLENOL) tablet 650 mg  650 mg Oral Q4H PRN Charlton Amor, PA-C   650 mg at 06/25/19 1781   Or  . acetaminophen (TYLENOL) suppository 650 mg   650 mg Rectal Q4H PRN Angiulli, Mcarthur Rossetti, PA-C      . baclofen (LIORESAL) tablet 15 mg  15 mg Oral QID Lovorn, Aundra Millet, MD   15 mg at 06/27/19 0615  . bisacodyl (DULCOLAX) suppository 10 mg  10 mg Rectal Daily Lovorn, Megan, MD   10 mg at 06/26/19 2113  . citalopram (CELEXA) tablet 20 mg  20 mg Oral Daily Lovorn, Megan, MD   20 mg at 06/27/19 1002  . diazepam (VALIUM) tablet 10 mg  10 mg Oral Q6H PRN Charlton Amor, PA-C   10 mg at 06/26/19 2112  . diclofenac Sodium (VOLTAREN) 1 % topical gel 2 g  2 g Topical QID Raulkar, Drema Pry, MD   2 g at 06/27/19 1004  . docusate sodium (COLACE) capsule 100 mg  100 mg Oral TID Horton Chin, MD   100 mg at 06/27/19 1002  . enoxaparin (LOVENOX) injection 40 mg  40 mg Subcutaneous Q24H Angiulli, Mcarthur Rossetti,  PA-C   40 mg at 06/26/19 2113  . enoxaparin (LOVENOX) patient education kit   Does not apply Continuous Raulkar, Clide Deutscher, MD   New Bag at 06/22/19 1523  . gabapentin (NEURONTIN) capsule 600 mg  600 mg Oral TID Cathlyn Parsons, PA-C   600 mg at 06/27/19 1001  . lidocaine (LIDODERM) 5 % 1 patch  1 patch Transdermal Q24H Lovorn, Megan, MD   1 patch at 06/27/19 1002  . methocarbamol (ROBAXIN) tablet 750 mg  750 mg Oral Q6H PRN Cathlyn Parsons, PA-C   750 mg at 06/27/19 0615  . ondansetron (ZOFRAN) tablet 4 mg  4 mg Oral Q6H PRN Angiulli, Lavon Paganini, PA-C       Or  . ondansetron [facility]) injection 4 mg  4 mg Intravenous Q6H PRN Angiulli, Lavon Paganini, PA-C      . oxyCODONE (Oxy IR/ROXICODONE) immediate release tablet 10 mg  10 mg Oral Q4H PRN Cathlyn Parsons, PA-C   10 mg at 06/26/19 2112  . oxyCODONE (Oxy IR/ROXICODONE) immediate release tablet 5-10 mg  5-10 mg Oral Q3H PRN Cathlyn Parsons, PA-C   5 mg at 06/13/19 0731  . polyethylene glycol (MIRALAX / GLYCOLAX) packet 17 g  17 g Oral Daily PRN Bary Leriche, PA-C   17 g at 06/20/19 1422  . polyethylene glycol (MIRALAX / GLYCOLAX) packet 17 g  17 g Oral TID Raulkar, Clide Deutscher, MD   17 g at  06/27/19 1002  . senna-docusate (Senokot-S) tablet 2 tablet  2 tablet Oral Daily Raulkar, Clide Deutscher, MD   2 tablet at 06/27/19 1002  . simethicone (MYLICON) chewable tablet 80 mg  80 mg Oral QID PRN Lovorn, Jinny Blossom, MD   80 mg at 06/13/19 1739  . sorbitol 70 % solution 30 mL  30 mL Oral Daily PRN Cathlyn Parsons, PA-C   30 mL at 06/22/19 2329  . topiramate (TOPAMAX) tablet 25 mg  25 mg Oral QHS Lovorn, Megan, MD   25 mg at 06/26/19 2112    REVIEW OF SYSTEMS:  On review of systems, the patient reports that they are doing well overall. They deny any chest pain, shortness of breath, cough, fevers, chills, night sweats. They deny abdominal pain, nausea or vomiting. They report constant pain to their left-handed middle finger, bilateral knees, and surgical site. They report inability to have a natural bowel movement (constipation currently managed with laxatives), ED, muscle weakness (left worse than right), left foot numbness, and leg spasms (controlled with baclofen). They also report only being able to walk very short distances with a walker before their knees give out. A complete review of systems is obtained and is otherwise negative.    PHYSICAL EXAM:  Wt Readings from Last 3 Encounters:  06/09/19 155 lb 10.3 oz (70.6 kg)  06/01/19 163 lb 4.8 oz (74.1 kg)  05/30/19 163 lb 4.8 oz (74.1 kg)   Temp Readings from Last 3 Encounters:  06/27/19 97.7 F (36.5 C) (Oral)  06/08/19 98.5 F (36.9 C) (Oral)  05/30/19 98.3 F (36.8 C) (Oral)   BP Readings from Last 3 Encounters:  06/27/19 121/87  06/08/19 114/79  05/30/19 (!) 136/91   Pulse Readings from Last 3 Encounters:  06/27/19 71  06/08/19 96  05/30/19 95    /10  Physical exam not performed in light of telephone consult visit format.   KPS = 60  100 - Normal; no complaints; no evidence of disease. 90   - Able to  carry on normal activity; minor signs or symptoms of disease. 80   - Normal activity with effort; some signs or  symptoms of disease. 54   - Cares for self; unable to carry on normal activity or to do active work. 60   - Requires occasional assistance, but is able to care for most of their personal needs. 50   - Requires considerable assistance and frequent medical care. 63   - Disabled; requires special care and assistance. 48   - Severely disabled; hospital admission is indicated although death not imminent. 60   - Very sick; hospital admission necessary; active supportive treatment necessary. 10   - Moribund; fatal processes progressing rapidly. 0     - Dead  Karnofsky DA, Abelmann [place], Craver LS and Burchenal [facility] [phone]) The use of the nitrogen mustards in the palliative treatment of carcinoma: with particular reference to bronchogenic carcinoma Cancer 1 634-56  LABORATORY DATA:  Lab Results  Component Value Date   WBC 8.4 06/27/2019   HGB 13.2 06/27/2019   HCT 41.8 06/27/2019   MCV 87.1 06/27/2019   PLT 284 06/27/2019   Lab Results  Component Value Date   NA 140 06/27/2019   K 4.0 06/27/2019   CL 105 06/27/2019   CO2 27 06/27/2019   Lab Results  Component Value Date   ALT 195 (H) 06/12/2019   AST 58 (H) 06/12/2019   ALKPHOS 124 06/12/2019   BILITOT 0.3 06/12/2019     RADIOGRAPHY: DG Cervical Spine 2 or 3 views  Result Date: 06/01/2019 CLINICAL DATA:  C6-C7 laminoplasty and biopsy of intramedullary tumor EXAM: CERVICAL SPINE - 2-3 VIEW; DG C-ARM 1-60 MIN COMPARISON:  05/23/2019 FINDINGS: Six fluoroscopic images are obtained during performance of the procedure and are provided for interpretation only. Initial image demonstrates surgical instrumentation posterior to the C4/C5 level. Subsequent imaging demonstrates instrumentation at the C7 level. The final image demonstrates retractors posterior to the C6/C7 level. Patient is intubated. FLUOROSCOPY TIME:  16 seconds IMPRESSION: 1. Intraoperative exam as above. Please refer to the operative report. Electronically Signed   By: Randa Ngo  M.D.   On: 06/01/2019 16:11   MR CERVICAL SPINE W WO CONTRAST  Result Date: 06/01/2019 CLINICAL DATA:  Cervical intramedullary tumor post resection EXAM: MRI CERVICAL SPINE WITHOUT AND WITH CONTRAST TECHNIQUE: Multiplanar and multiecho pulse sequences of the cervical spine, to include the craniocervical junction and cervicothoracic junction, were obtained without and with intravenous contrast. CONTRAST:  7.54m GADAVIST GADOBUTROL 1 MMOL/ML IV SOLN COMPARISON:  Outside study FINDINGS: Alignment: Stable. Vertebrae: Stable vertebral body heights. Postoperative changes spanning C5-C6 to C7-T1. Cord: Heterogeneous signal is again identified within the cervical cord spanning approximately C4 to T1-T2. Cystic areas appear to be partially decompressed with decreased expansile appearance. Low T2 signal likely reflecting blood products again noted. There is mild, likely postoperative enhancement. Previously seen nodular enhancement no longer identified. Posterior Fossa, vertebral arteries, paraspinal tissues: Unremarkable apart from postoperative changes. Disc levels: Stable appearance.  No degenerative stenosis. IMPRESSION: Expected postoperative changes post intramedullary mass partial decompression. No residual nodular enhancement. Electronically Signed   By: PMacy MisM.D.   On: 06/01/2019 21:50   DG Abd Portable 1V  Result Date: 06/10/2019 CLINICAL DATA:  Abdominal pain EXAM: PORTABLE ABDOMEN - 1 VIEW COMPARISON:  None. FINDINGS: Diffuse gaseous distension of bowel without over distension or obstructive pattern. Gas reaches the rectum. No concerning mass effect or gas collection. Left pelvic calcifications are usually phleboliths. IMPRESSION: Diffuse bowel gas  without obstructive pattern. Electronically Signed   By: Monte Fantasia M.D.   On: 06/10/2019 07:17   DG C-Arm 1-60 Min  Result Date: 06/01/2019 CLINICAL DATA:  C6-C7 laminoplasty and biopsy of intramedullary tumor EXAM: CERVICAL SPINE - 2-3 VIEW;  DG C-ARM 1-60 MIN COMPARISON:  05/23/2019 FINDINGS: Six fluoroscopic images are obtained during performance of the procedure and are provided for interpretation only. Initial image demonstrates surgical instrumentation posterior to the C4/C5 level. Subsequent imaging demonstrates instrumentation at the C7 level. The final image demonstrates retractors posterior to the C6/C7 level. Patient is intubated. FLUOROSCOPY TIME:  16 seconds IMPRESSION: 1. Intraoperative exam as above. Please refer to the operative report. Electronically Signed   By: Randa Ngo M.D.   On: 06/01/2019 16:11   VAS [place] LOWER EXTREMITY VENOUS (DVT)  Result Date: 06/09/2019  Lower Venous DVTStudy Indications: Edema.  Comparison Study: No prior study Performing Technologist: Maudry Mayhew MHA, RDMS, RVT, RDCS  Examination Guidelines: A complete evaluation includes B-mode imaging, spectral Doppler, color Doppler, and power Doppler as needed of all accessible portions of each vessel. Bilateral testing is considered an integral part of a complete examination. Limited examinations for reoccurring indications may be performed as noted. The reflux portion of the exam is performed with the patient in reverse Trendelenburg.  +---------+---------------+---------+-----------+----------+--------------+ RIGHT    CompressibilityPhasicitySpontaneityPropertiesThrombus Aging +---------+---------------+---------+-----------+----------+--------------+ CFV      Full           Yes      Yes                                 +---------+---------------+---------+-----------+----------+--------------+ SFJ      Full                                                        +---------+---------------+---------+-----------+----------+--------------+ FV Prox  Full                                                        +---------+---------------+---------+-----------+----------+--------------+ FV Mid   Full                                                         +---------+---------------+---------+-----------+----------+--------------+ FV DistalFull                                                        +---------+---------------+---------+-----------+----------+--------------+ PFV      Full                                                        +---------+---------------+---------+-----------+----------+--------------+ POP  Full           Yes      Yes                                 +---------+---------------+---------+-----------+----------+--------------+ PTV      Full                                                        +---------+---------------+---------+-----------+----------+--------------+ PERO     Full                                                        +---------+---------------+---------+-----------+----------+--------------+   +---------+---------------+---------+-----------+----------+--------------+ LEFT     CompressibilityPhasicitySpontaneityPropertiesThrombus Aging +---------+---------------+---------+-----------+----------+--------------+ CFV      Full           Yes      Yes                                 +---------+---------------+---------+-----------+----------+--------------+ SFJ      Full                                                        +---------+---------------+---------+-----------+----------+--------------+ FV Prox  Full                                                        +---------+---------------+---------+-----------+----------+--------------+ FV Mid   Full                                                        +---------+---------------+---------+-----------+----------+--------------+ FV DistalFull                                                        +---------+---------------+---------+-----------+----------+--------------+ PFV      Full                                                         +---------+---------------+---------+-----------+----------+--------------+ POP      Full           Yes      Yes                                 +---------+---------------+---------+-----------+----------+--------------+  PTV      Full                                                        +---------+---------------+---------+-----------+----------+--------------+ PERO     Full                                                        +---------+---------------+---------+-----------+----------+--------------+     Summary: RIGHT: - There is no evidence of deep vein thrombosis in the lower extremity.  - No cystic structure found in the popliteal fossa.  LEFT: - There is no evidence of deep vein thrombosis in the lower extremity.  - No cystic structure found in the popliteal fossa.  *See table(s) above for measurements and observations. Electronically signed by Ruta Hinds MD on 06/09/2019 at 7:37:44 PM.    Final       IMPRESSION/PLAN: This visit was conducted via Telephone to spare the patient unnecessary potential exposure in the healthcare setting during the current COVID-19 pandemic. 1. 34 y.o. nonbinary patient with grade 2 ependymoma. Today, we talked to the patient about the findings and workup thus far. We discussed the natural history of ependymoma and general treatment, highlighting the role of adjuvant radiotherapy in the management. We discussed the available radiation techniques, and focused on the details and logistics of delivery.  We discussed the options of proceeding with a 6-week course of daily radiotherapy at the resection site versus close monitoring/observation with our recommendation favoring proceeding with daily radiotherapy.  We reviewed the anticipated acute and late sequelae associated with radiation in this setting. The patient was encouraged to ask questions that were answered to their stated satisfaction.  At the end of the conversation, the patient appears to  have a good understanding of their disease and our treatment recommendations and is interested in moving forward with adjuvant radiotherapy. They would like to take some time to continue participating with OT/PT in the home prior to proceeding with daily radiotherapy, which we agree is reasonable. Therefore, we will plan on getting them back in for CT simulation around 07/28/2019 in anticipation of beginning their daily radiation treatments in early August 2021.  We will share our discussion with Dr. Mickeal Skinner and Dr. Venetia Constable and move forward with treatment planning accordingly.  Given current concerns for patient exposure during the COVID-19 pandemic, this encounter was conducted via telephone. The patient was notified in advance and was offered a MyChart meeting to allow for face to face communication but unfortunately reported that they did not have the appropriate resources/technology to support such a visit and instead preferred to proceed with telephone consult. The patient has given verbal consent for this type of encounter. The time spent during this encounter was 90 minutes. The attendants for this meeting include Tyler Pita MD, Ashlyn Bruning PA-C, [place], and patient, Treylen "B" Walling. During the encounter, Tyler Pita MD, Ashlyn Bruning PA-C, and scribe, Wilburn Mylar were located at [place].  Patient, Tiron "B" Fortin was located at the [facility] unit.    Nicholos Johns, PA-C    Tyler Pita, MD  [place]  Radiation Oncology Direct Dial: [phone]  Fax: [phone] [email or web address]  Skype  [email or web address]  This document serves as a record of services personally performed by Tyler Pita, MD and Freeman Caldron, PA-C. It was created on their behalf by Wilburn Mylar, a trained medical scribe. The creation of this record is based on the scribe's personal observations and the provider's  statements to them. This document has been checked and approved by the attending provider.

## 2019-06-27 NOTE — Discharge Summary (Signed)
Physician Discharge Summary  Patient ID: Jacob Lambert MRN: [id number] DOB/AGE: 02-06-1985 34 y.o.  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 Initial image demonstrates surgical instrumentation posterior to the C4/C5 level. Subsequent imaging demonstrates instrumentation at the C7 level. The final image demonstrates retractors posterior to the C6/C7 level. Patient is intubated. FLUOROSCOPY TIME:  16 seconds IMPRESSION: 1. Intraoperative exam as above. Please refer to the operative report. Electronically Signed   By: Randa Ngo M.D.   On: 06/01/2019 16:11   MR CERVICAL SPINE W WO CONTRAST  Result Date: 06/01/2019 CLINICAL DATA:  Cervical intramedullary tumor post resection EXAM: MRI CERVICAL SPINE WITHOUT AND WITH CONTRAST TECHNIQUE: Multiplanar and multiecho pulse sequences of the cervical spine, to include the craniocervical junction and cervicothoracic junction, were obtained without and with intravenous contrast. CONTRAST:  7.30mL GADAVIST GADOBUTROL 1 MMOL/ML IV SOLN COMPARISON:  Outside study FINDINGS: Alignment: Stable. Vertebrae: Stable vertebral body heights. Postoperative changes spanning C5-C6 to C7-T1. Cord: Heterogeneous signal is again identified within the cervical cord spanning approximately C4 to T1-T2. Cystic areas appear to be partially decompressed with decreased  expansile appearance. Low T2 signal likely reflecting blood products again noted. There is mild, likely postoperative enhancement. Previously seen nodular enhancement no longer identified. Posterior Fossa, vertebral arteries, paraspinal tissues: Unremarkable apart from postoperative changes. Disc levels: Stable appearance.  No degenerative stenosis. IMPRESSION: Expected postoperative changes post intramedullary mass partial decompression. No residual nodular enhancement. Electronically Signed   By: Macy Mis M.D.   On: 06/01/2019 21:50   DG Abd Portable 1V  Result Date: 06/10/2019 CLINICAL DATA:  Abdominal pain EXAM: PORTABLE ABDOMEN - 1 VIEW COMPARISON:  None. FINDINGS: Diffuse gaseous distension of bowel without over distension or obstructive pattern. Gas reaches the rectum. No concerning mass effect or gas collection. Left pelvic calcifications are usually phleboliths. IMPRESSION: Diffuse bowel gas without obstructive pattern. Electronically Signed   By: Monte Fantasia M.D.   On: 06/10/2019 07:17   DG C-Arm 1-60 Min  Result Date: 06/01/2019 CLINICAL DATA:  C6-C7 laminoplasty and biopsy of intramedullary tumor EXAM: CERVICAL SPINE - 2-3 VIEW; DG C-ARM 1-60 MIN COMPARISON:  05/23/2019 FINDINGS: Six fluoroscopic images are obtained during performance of the procedure and are provided for interpretation only. Initial image demonstrates surgical instrumentation posterior to the C4/C5 level. Subsequent imaging demonstrates instrumentation at the C7 level. The final image demonstrates retractors posterior to the C6/C7 level. Patient is intubated. FLUOROSCOPY TIME:  16 seconds IMPRESSION: 1. Intraoperative exam as above. Please refer to the operative report. Electronically Signed   By: Randa Ngo M.D.   On: 06/01/2019 16:11   VAS [place] LOWER EXTREMITY VENOUS (DVT)  Result Date: 06/09/2019  Lower Venous DVTStudy Indications: Edema.  Comparison Study: No prior study Performing Technologist: Maudry Mayhew MHA, RDMS, RVT, RDCS  Examination Guidelines: A complete evaluation includes B-mode imaging, spectral Doppler, color Doppler, and power Doppler as needed of all accessible portions of each vessel.  Bilateral testing is considered an integral part of a complete examination. Limited examinations for reoccurring indications may be performed as noted. The reflux portion of the exam is performed with the patient in reverse Trendelenburg.  +---------+---------------+---------+-----------+----------+--------------+ RIGHT    CompressibilityPhasicitySpontaneityPropertiesThrombus Aging +---------+---------------+---------+-----------+----------+--------------+ CFV      Full           Yes      Yes                                 +---------+---------------+---------+-----------+----------+--------------+ SFJ      Full                                                        +---------+---------------+---------+-----------+----------+--------------+ FV Prox  Full                                                        +---------+---------------+---------+-----------+----------+--------------+ FV Mid   Full                                                        +---------+---------------+---------+-----------+----------+--------------+ FV DistalFull                                                        +---------+---------------+---------+-----------+----------+--------------+ PFV      Full                                                        +---------+---------------+---------+-----------+----------+--------------+ POP      Full           Yes      Yes                                 +---------+---------------+---------+-----------+----------+--------------+ PTV      Full                                                        +---------+---------------+---------+-----------+----------+--------------+ PERO     Full                                                         +---------+---------------+---------+-----------+----------+--------------+   +---------+---------------+---------+-----------+----------+--------------+ LEFT     CompressibilityPhasicitySpontaneityPropertiesThrombus Aging +---------+---------------+---------+-----------+----------+--------------+  CFV      Full           Yes      Yes                                 +---------+---------------+---------+-----------+----------+--------------+ SFJ      Full                                                        +---------+---------------+---------+-----------+----------+--------------+ FV Prox  Full                                                        +---------+---------------+---------+-----------+----------+--------------+ FV Mid   Full                                                        +---------+---------------+---------+-----------+----------+--------------+ FV DistalFull                                                        +---------+---------------+---------+-----------+----------+--------------+ PFV      Full                                                        +---------+---------------+---------+-----------+----------+--------------+ POP      Full           Yes      Yes                                 +---------+---------------+---------+-----------+----------+--------------+ PTV      Full                                                        +---------+---------------+---------+-----------+----------+--------------+ PERO     Full                                                        +---------+---------------+---------+-----------+----------+--------------+     Summary: RIGHT: - There is no evidence of deep vein thrombosis in the lower extremity.  - No cystic structure found in the popliteal fossa.  LEFT: - There is no evidence of deep vein thrombosis in the lower extremity.  - No cystic structure found in the  popliteal fossa.  *  See table(s) above for measurements and observations. Electronically signed by Ruta Hinds MD on 06/09/2019 at 7:37:44 PM.    Final     Labs:  Basic Metabolic Panel: No results for input(s): NA, K, CL, CO2, GLUCOSE, BUN, CREATININE, CALCIUM, MG, PHOS in the last 168 hours.  CBC: No results for input(s): WBC, NEUTROABS, HGB, HCT, MCV, PLT in the last 168 hours.  CBG: No results for input(s): GLUCAP in the last 168 hours.  Brief HPI:   Jacob Lambert is a 34 y.o. right-handed individual with history of tobacco abuse, anxiety depression as well as headaches..  Per chart review patient presently living with their parents although does have long term residencies in both [place] and [place].  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  Presented 06/01/2019 with progressive ataxia and leg weakness since February 2021.  Recent MRI of the brain negative.  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  Patient has been followed by both Dr. Cecil Cobbs medical oncology as well as neurosurgery.  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 Venetia Constable.  Decadron protocol as indicated.  Final pathology grade 2 ependyoma.  Lovenox initiated for DVT prophylaxis 06/06/2019.  Pain control with the use of Neurontin and Lidoderm patch initially.  Patient had been using Robaxin and Valium for muscle spasms as needed as well as oxycodone for breakthrough pain.  Bouts of constipation resolved with laxative assistance.  Patient was admitted for a comprehensive rehab program.   Hospital Course: Jacob Lambert was admitted to rehab 06/08/2019 for inpatient therapies to consist of PT, ST and OT at least three hours five days a week. Past admission physiatrist, therapy team and rehab RN have worked together to provide customized collaborative inpatient rehab.   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  Status post C6-7 laminoplasty resection of spinal cord tumor 06/01/2019.  Patient would follow-up neurosurgery.  Surgical site healing nicely.  Lovenox for DVT prophylaxis x 2 months post op.  Vascular studies negative.  Pain managed with use of Neurontin scheduled as well as Lidoderm patch.  Using Valium Robaxin as needed for muscle spasms oxycodone for breakthrough pain.  Topamax was added for bouts of headache.  Baclofen was added for spasticity.  Mood stabilization with Celexa patient was attending full therapies follow-up per neuropsychology.  Patient with bouts of constipation resolved with laxative assistance and ongoing education.   Blood pressures were monitored on TID basis and soft and controlled   Rehab course: During patient's stay in rehab weekly team conferences were held to monitor patient's progress, set goals and discuss barriers to discharge. At admission, patient required supervision side-lying to sitting moderate assist sit to side-lying.  Minimal guard stand pivot transfers, minimal assist 75 feet rolling walker.  Supervision toilet transfers  Physical exam.  Blood pressure 121/84 pulse 86 temperature 98.1 respirations 18 oxygen saturations 100% room air Constitutional.  Alert and oriented HEENT Head.  Normocephalic and atraumatic Eyes.  Pupils round and reactive to light no discharge.nystagmus Neck.  Supple nontender no JVD without thyromegaly.  Posterior neck incision clean and dry Cardiac regular rate rhythm without any extra sounds or murmur heard Abdomen.  Soft nontender positive bowel sounds without rebound Respiratory effort normal no respiratory distress without Musculoskeletal.  Comments.  Upper extremity biceps 5/5, wrist extension 5 -/5,  triceps 4/5, grip 3+/5, finger abduction 3/5.  Right lower extremity hip flexors 3 -/5 knee  extension 3+/5 dorsiflexion 4/5 plantarflexion 4/5 EHL 4/5 Left lower extremity hip flexion 3/5 knee extension 4/5 dorsiflexion 5/5 plantar flexion 5/5 EHL 4+/5 wheeze  Patient has had improvement in activity tolerance, balance, postural control as well as ability to compensate for deficits.  Patient has had improvement in functional use RUE/LUE  and RLE/LLE as well as improvement in awareness.  Working with energy conservation techniques.  Sit to stand perform rolling walker supervision throughout sessions.  Reviewed the correct performance of each exercise with therapies.  Independently propels wheelchair.  Ambulates 150 feet from therapy gym to room using rolling walker contact-guard assist.  Patient dons clothing edge of bed with set up.  Completes all transfers modified independent.  Full family teaching completed plan discharge to home       Disposition: Discharge to home    Diet: Regular  Special Instructions: No driving smoking or alcohol  Medications at discharge 1.  Tylenol as needed 2.  Baclofen 15 mg p.o. 4 times daily 3.  Dulcolax suppository daily 4.  Celexa 20 mg p.o. daily 5.  Valium 10 mg p.o. every 6 hours as needed muscle spasms 6.  Voltaren gel 2 g 4 times daily to affected area 7.  Colace 100 mg p.o. 3 times daily 8.  Neurontin 600 mg p.o. 3 times daily 9.  Lidoderm patch change as directed 10.  Robaxin-750 milligram p.o. over 6 hours as needed muscle spasms 11.  Oxycodone 10 mg every 4 hours as needed pain 12.  MiraLAX 3 times daily hold for loose stools 13.  Senokot S2 tablets p.o. daily 14.  Topamax 25 mg p.o. nightly 15.Viagra prn 16.Lovenox 40 mg daily until 08/01/2019  30-35 minutes were spent completing discharge summary and discharge planning  Discharge Instructions    Ambulatory referral to Physical Medicine Rehab   Complete by: As directed    Moderate complexity follow-up 1 to 2 weeks at C5 [place]-D myelopathy secondary to cervical cord tumor        Follow-up Information    Lovorn, Jinny Blossom, MD Follow up.   Specialty: Physical Medicine and Rehabilitation Why: Office to call for appointment Contact information: [address] [place] [address] [phone]        [name], MD Follow up.   Specialty: Neurosurgery Why: Call for appointment Contact information: [place] [address] [phone]        Ventura Sellers, MD Follow up.   Specialties: Psychiatry, Neurology, Oncology Why: Call for appointment Contact information: [place] [address] [phone]               Signed: Lavon Paganini Holly 06/27/2019, 6:30 AM

## 2019-06-27 NOTE — Progress Notes (Signed)
[place] PHYSICAL MEDICINE & REHABILITATION PROGRESS NOTE   Subjective/Complaints:  No pain with bBM las tnight- which is new- emptied stool last night- no additional incontinence this AM- but took 3.5 hours.   Also had some results with dig stim again.   Spasms much better this AM with change in Baclofen.   Notes has CT scan Friday.   ROS:  Pt denies SOB, abd pain, CP, N/V/C/D, and vision changes   Objective:   No results found. Recent Labs    06/27/19 0726  WBC 8.4  HGB 13.2  HCT 41.8  PLT 284   Recent Labs    06/27/19 0726  NA 140  K 4.0  CL 105  CO2 27  GLUCOSE 93  BUN 11  CREATININE 1.17  CALCIUM 9.5    Intake/Output Summary (Last 24 hours) at 06/27/2019 1014 Last data filed at 06/27/2019 0820 Gross per 24 hour  Intake 480 ml  Output 950 ml  Net -470 ml     Physical Exam: Vital Signs Blood pressure 121/87, pulse 71, temperature 97.7 F (36.5 C), temperature source Oral, resp. rate 18, height 5' 10.5" (1.791 m), weight 70.6 kg, SpO2 100 %. General: sitting up in bed; appropriate, still upset when changed topics about this weekend; NAD- father joined near end of visit HEENT: conjugate gaze Heart: RRR Chest: CTA B/L Abdomen: soft, NT, ND, (+)BS Skin: Warm and dry.  Intact. Musc: No edema in extremities.  No tenderness in extremities. Neuro: Alert Facial muscles intact Motor:  RUE: 4+/5 in deltoid, biceps, triceps, WE, grip and finger abd LUE:4+/5 in same muscles except 3-/5 in finger abd RLE: HF 4+/5, KE 5-/5, DF and PF 5-/5 LLE: HF 4/5, KE 5-/5, DF and PF 5-/5 4-5 beats clonus RLE , 2-3 beats LLE still MAS stable 2 in RLE; 1+ in LLE- no spasms seen, but was constantly moving their LEs- tapping feet. Decreased sensation along dorsum of left foot and left calf.  Functional mobility: transfers with supervision, MinA for shaving. Psych: emotional  Assessment/Plan: 1. 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  Physiatrist is providing close team supervision and 24 hour management of active medical problems listed below.  Physiatrist and rehab team continue to assess barriers to discharge/monitor patient progress toward functional and medical goals  Care Tool:  Bathing    Body parts bathed by patient: Right arm, Left arm, Chest, Abdomen, Front perineal area, Right upper leg, Left upper leg, Right lower leg, Left lower leg, Face, Buttocks   Body parts bathed by helper: Buttocks     Bathing assist Assist Level: Independent with assistive device     Upper Body Dressing/Undressing Upper body dressing   What is the patient wearing?: Pull over shirt    Upper body assist Assist Level: Independent    Lower Body Dressing/Undressing Lower body dressing      What is the patient wearing?: Underwear/pull up, Pants     Lower body assist Assist for lower body dressing: Independent with assitive device     Toileting Toileting    Toileting assist Assist for toileting: Independent with assistive device Assistive Device Comment: urinal   Transfers Chair/bed transfer  Transfers assist     Chair/bed transfer assist level: Supervision/Verbal cueing Chair/bed transfer assistive device: Programmer, multimedia   Ambulation assist      Assist level: Contact Guard/Touching  assist Assistive device: Walker-rolling Max distance: 150 feet   Walk 10 feet activity   Assist     Assist level: Contact Guard/Touching assist Assistive device: Walker-rolling   Walk 50 feet activity   Assist    Assist level: Contact Guard/Touching assist Assistive device: Walker-rolling    Walk 150 feet activity   Assist    Assist level: Contact Guard/Touching assist Assistive device: Walker-rolling    Walk 10 feet on uneven surface  activity   Assist Walk 10 feet on uneven  surfaces activity did not occur: Safety/medical concerns   Assist level: Contact Guard/Touching assist Assistive device: Walker-rolling   Wheelchair     Assist Will patient use wheelchair at discharge?: Yes Type of Wheelchair: Manual    Wheelchair assist level: Independent Max wheelchair distance: 150 feet    Wheelchair 50 feet with 2 turns activity    Assist        Assist Level: Independent   Wheelchair 150 feet activity     Assist      Assist Level: Independent   Blood pressure 121/87, pulse 71, temperature 97.7 F (36.5 C), temperature source Oral, resp. rate 18, height 5' 10.5" (1.791 m), weight 70.6 kg, SpO2 100 %.  Medical Problem List and Plan: 1.  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  Status post C6-7 laminoplasty resection of spinal cord tumor 06/01/2019.  Decadron taper completed  Continue CIR  Transferring with supervision, MinA for shaving 2.  Antithrombotics: -DVT/anticoagulation: Lovenox.  Will need for 2-3 months depending on walking ability  Vascular study negative for DVT             -antiplatelet therapy: N/A 3. Pain Management: Neurontin 600 mg 3 times daily, Lidoderm patch as directed, Valium 10 mg every 6 hours as needed muscle spasms, Robaxin 750 mg every 6 hours as needed muscle spasms, oxycodone as needed  Topamax 25 daily started on 6/6 for migraines per patient request  6/8- change to QHS so doesn't sedate pt. Change lidoderm to 8am to 8pm- changed.   6/15- pain is improved.   6/16: Pain worst in left 3rd digit- diclofenac gel prescribed.   6/19: The diclofenac gel has provided some relief.   6/21- pain doing pretty well 4. Mood: - don't think pt needs meds- will add if needed  6/9- will add Celexa 20 mg daily for affect/mood  6/12- no complaints of side effects, pt very appreciative that it was started  6/16: has noted benefits from  medication.   6/17: appears to be well controlled.   6/18: well controlled             -antipsychotic agents: N/A 5. Neuropsych: This patient is capable of making decisions on their own behalf. 6. Skin/Wound Care: Routine skin checks 7. Fluids/Electrolytes/Nutrition: Routine in and outs  BMP within acceptable range on 6/4  6/8- looks good 6/7- no issues 8.  Neurogenic bowel.    Added suppository nightly and digital stimulation of rectum to improve constipation and bowel movements- if recovers ability to have BMs on their own, will stop.   6/10- <1 hour til results after bowel program last night- better   6/13- BM <1 hr after suppository- patterns improving, continent  6/14- NEEDS dig stim- explained that's why not having results fast  6/16: Had BM last night with suppository. Dig stim was performed.   6/18: Had BM after dig stim last night but did  not feel they had complete evacuation. Increase Miralax to BID.  6/19: Had BM less than an hour after last night's bowel program, but still felt incomplete evacuation. Increase Miralax to TID. Discussed high fiber diet. Patient drinks 8 glasses of coffee and tea at home. Advised that they may need less stool softeners as a result at home and should increase consumption of coffee and tea at a slow pace to avoid incontinence. Will likely need to wean off stool softeners as consumption of these beverages increase.   6/20: Had 97% evacuation last night after dig stim and suppository.   6/21- evacuated completely last night after dig stim.   6/22- evacuated completely again- took 3.5 hrs. No pain 9. Minimal PVRs- will not reorder PVRs- voiding OK-  10. Spasticity- explained it's normal that it gets worse over time- is very mild right now- will monitor  6/7- on valium 10 mg q6 hours prn for spasms  6/11 baclofen added, 5mg  tid  6/13 pt wants to treat spasms further. I told them we can increase baclofen to 10mg  TID, but we will need to watch for sedation,  hypotension, etc. Pt understands and wants to try.   6/14- no increased sedation- no side effects so far- not sure if helping yet  6/15: Patient says they are experiencing benefit without sedation.  6/17: received one valium this morning.  6/20: Increase baclofen to 15mg  TID starting at midnight as per patient's preference and sleep schedule.   6/21- will change baclofen to 15 mg QID and start at 0600 since spasms when they wake up are the worst time of day.   6/22- went better 11. Leukocytosis  WBCs 13.8 on 6/4, labs ordered for tomorrow  6/7- WBC down to 11.7k- doing well  Afebrile 12.  Transaminitis  LFTs elevated on 6/4, labs ordered for tomorrow  6/7- actually slightly higher- 58/195- from 48/188- not on any meds that would cause it- will follow 13. Tachycardia  6/7- Will add metoprolol 12.5 mg BID for fast heart rate  6/9- Heart rate ~ 100-  6/13 HR in 70's to 80's. Given  6/18: well controlled  14. Presyncopal episode after bm  -likely vasovagal along with myelopathy, med effects etc  -discussed the importance of sitting down if experiences symptoms  -avoid excessive valsalva with emptying  -adequate fluid intake  -further med adjustments if needed.   -6/22- no more episodes 15. Incident 6/19: Upon my entry to room, patient was tearful after finding the word "fag" written on the wall of elevator 1057 at 8:40am when they were going downstairs with occupational therapist Mariane Masters. Patient has been crying on and off all morning as was very disturbed by seeing the word and the possibility that staff may have seen it and not taken action. B was very appreciative of Colletta Maryland alerting the custodial staff, [place], and Don and immediately getting the writing taken down. I provided emotional support to patient and emailed Dr. Naaman Plummer and Timmothy Sours about this issue.   6/20: Patient is still understandably very upset about this incident and also that no [facility] executive has followed up with  them regarding complaints of care in progressive unit. Hillary RN and I listened to patient's concerns and I have emailed concerns to Dr. Naaman Plummer, Timmothy Sours, and Dr. Dagoberto Ligas.   6/21- went to email additional staff, but they had been added to email list, will atttmept to reach [facility].   6/22- filled out form for handicapped/DMV placard.    Spent 35 minutes  total on care today- listening to pt about their concerns.   LOS: 19 days A FACE TO FACE EVALUATION WAS PERFORMED  Khloe Hunkele 06/27/2019, 10:14 AM

## 2019-06-27 NOTE — Progress Notes (Signed)
Physical Therapy Note  Patient Details  Name: Jacob Lambert MRN: [id number] Date of Birth: Mar 15, 1985 Today's Date: 06/27/2019    Attempted to see patient for scheduled therapy session. Pt unavailable as they have a scheduled MyChart visit with their oncologist. Pt missed 45 min of scheduled therapy session due to being unavailable.   Excell Seltzer, PT, DPT  06/27/2019, 12:44 PM

## 2019-06-27 NOTE — Progress Notes (Signed)
Occupational Therapy Session Note  Patient Details  Name: Jacob Lambert MRN: [id number] Date of Birth: 21-Apr-1985  Today's Date: 06/27/2019 OT Individual Time: 0900-1000 OT Individual Time Calculation (min): 60 min    Short Term Goals: Week 2:  OT Short Term Goal 1 (Week 2): Pt will transfer wiht S overall consistently with LRAD to toilet OT Short Term Goal 1 - Progress (Week 2): Met OT Short Term Goal 2 (Week 2): Pt will step over shower edge wiht CGA using LRAD OT Short Term Goal 2 - Progress (Week 2): Met OT Short Term Goal 3 (Week 2): Pt will complete LB dressing with S OT Short Term Goal 3 - Progress (Week 2): Met Week 3:  OT Short Term Goal 1 (Week 3): STG=LTG secondary to ELOS  Skilled Therapeutic Interventions/Progress Updates:    Pt resting in bed upon arrival with father present.  OT intervention with focus on bed mobility, functional transfers, standing balance, functional amb with RW, safety awareness, and activity tolerance to increase independence with BADLs and prepare for discharge tomorrow.  All transfers at mod I.  Standing balance to toss bean bags with mod I. Pt amb with RW and used reacher to pick up bean bags from floor. Mod I for all tasks.  No unsafe behaviors noted.  W/c mobility and set up at mod I. Pt given information containing list of sample questions to ask hotels and other establishments regarding accessibility. Pt returned to room and transferred back to bed.  Pt resting in bed with RN present.  All needs within reach.   Therapy Documentation Precautions:  Precautions Precautions: Fall Precaution Booklet Issued: No Precaution Comments: watch BP and HR Restrictions Weight Bearing Restrictions: No   Pain:  Pt c/o ongoing burning in L hand 3rd digit; meds admin at end of session   Therapy/Group: Individual Therapy  [name] 06/27/2019, 10:49 AM

## 2019-06-27 NOTE — Progress Notes (Signed)
Physical Therapy Discharge Summary  Patient Details  Name: Jacob Lambert MRN: [id number] Date of Birth: August 11, 1985  Today's Date: 06/27/2019 PT Individual Time: 1300-1400 PT Individual Time Calculation (min): 60 min   Patient has met 5 of 7 long term goals due to improved balance, improved postural control, increased strength, decreased pain, ability to compensate for deficits, improved awareness and improved coordination.  Patient to discharge at a wheelchair level Modified Independent and ambulatory with RW at supervision level. Patient's care partner is independent to provide the necessary physical assistance at discharge.  Reasons goals not met: Pt did not meet gait goal of mod I d/t ongoing balance deficits limiting their safety with functional mobility.  Recommendation:  Patient will benefit from ongoing skilled PT services in home health setting to continue to advance safe functional mobility, address ongoing impairments in balance, endurance, strength, safety, independence with functional mobility, sensation/proprioception, and minimize fall risk.  Equipment: 16x18 W/C, pt already owns RW  Reasons for discharge: treatment goals met and discharge from hospital  Patient/family agrees with progress made and goals achieved: Yes   Skilled Intervention: Pt received semi-reclined in bed. Agreeable to therapy. Denies any pain. Pt is independent with bed mobility. Pt performed transfers and sit <> stand with RW at a mod-I level throughout session. Pt independently propelled themself 150-200 feet to the therapy gym with W/C. Ascended/descended a total of 12 stairs using B/L handrails with a step-through pattern and supervision. Reviewed with pt how they will perform stairs at home with both hands holding onto the L handrail and stepping laterally. Pt states they are comfortable with this skill and do not feel the need to practice it today. Reviewed car transfer with pt who then performed car  transfer with RW and supervision. Verbal cues required for RW placement. Pt states their parents drive a Subaru which is relatively low to the ground. Reviewed with pt how to perform a wheelie in the W/C to get over curbs; pt states they practiced this skill with OT Colletta Maryland this past weekend. Pt ascended/descended a 10 foot ramp with RW and supervision. Pt ambulated over uneven ground with RW and close supervision. Verbal cues required for safe RW management. Ambulated >200 feet on a level, noncompliant surface with RW and supervision. On return to room, pt felt the urge to have a BM. Pt was able to transfer to the toilet before having a continent BM. Pt was independent with clothing management. Pericare performed by therapist. Pt left semi-reclined in bed with all needs in reach. All questions answered  PT Discharge Precautions/Restrictions Precautions Precautions: Cervical;Fall Precaution Booklet Issued: No (Discussed cervical precautions with pt) Restrictions Weight Bearing Restrictions: No Vital Signs Therapy Vitals Temp: 99.6 F (37.6 C) Temp Source: Oral Pulse Rate: 86 Resp: 17 BP: 95/68 Patient Position (if appropriate): Lying Oxygen Therapy SpO2: 98 % O2 Device: Room Air Pain Pain Assessment Pain Scale: 0-10 Pain Score: 0-No pain Faces Pain Scale: No hurt Vision/Perception  Vision - History Baseline Vision: Other (comment) Visual History:  (Decreased R peripheral vision) Perception Perception: Within Functional Limits Praxis Praxis: Intact  Cognition Overall Cognitive Status: Within Functional Limits for tasks assessed Arousal/Alertness: Awake/alert Orientation Level: Oriented X4 Attention: Focused;Sustained Focused Attention: Appears intact Sustained Attention: Appears intact Memory: Appears intact Awareness: Appears intact Problem Solving: Appears intact Safety/Judgment: Appears intact Sensation Sensation Light Touch: Impaired Detail Light Touch Impaired  Details: Impaired LUE;Impaired LLE (distal > proximal) Proprioception: Impaired Detail Proprioception Impaired Details: Impaired LLE Coordination Gross Motor Movements are  Fluid and Coordinated: No Fine Motor Movements are Fluid and Coordinated: No Coordination and Movement Description: 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 Motor  Motor Motor: Tetraplegia Motor - Skilled Clinical Observations: decreased coordination, strength and motor planning in L extremities Motor - Discharge Observations: decreased d/t incomplete tetraplegia  Mobility Bed Mobility Bed Mobility: Rolling Right;Rolling Left;Right Sidelying to Sit;Left Sidelying to Sit;Supine to Sit;Sitting - Scoot to Edge of Bed;Sit to Supine;Sit to Sidelying Right;Sit to Sidelying Left;Scooting to [facility] Rolling Right: Independent Rolling Left: Independent Right Sidelying to Sit: Independent Left Sidelying to Sit: Independent Supine to Sit: Independent Sitting - Scoot to Edge of Bed: Independent Sit to Supine: Independent Sit to Sidelying Right: Independent Sit to Sidelying Left: Independent Scooting to HOB: Independent Transfers Transfers: Sit to Stand;Stand to [employer] Transfers Sit to Stand: Independent with assistive device Stand to Sit: Independent with assistive device Stand Pivot Transfers: Independent with assistive device Squat Pivot Transfers: Independent with assistive device Transfer (Assistive device): Rolling walker Locomotion  Gait Ambulation: Yes Gait Assistance: Supervision/Verbal cueing Gait Distance (Feet): 200 Feet Assistive device: Rolling walker Gait Assistance Details: Verbal cues for precautions/safety;Verbal cues for gait pattern Gait Gait: Yes Gait Pattern: Impaired Gait Pattern: Step-through pattern;Scissoring;Ataxic Gait velocity: decreased Stairs / Additional Locomotion Stairs: Yes Stairs Assistance: Supervision/Verbal cueing Stair Management Technique: Two  rails;Step to pattern Number of Stairs: 12 Height of Stairs: 6 Ramp: Supervision/Verbal cueing Wheelchair Mobility Wheelchair Mobility: Yes Wheelchair Assistance: Independent with Camera operator: Both upper extremities Wheelchair Parts Management: Independent Distance: 200  Trunk/Postural Assessment  Cervical Assessment Cervical Assessment: Exceptions to [facility] (cervical precautions) Thoracic Assessment Thoracic Assessment: Within Functional Limits Lumbar Assessment Lumbar Assessment: Within Functional Limits Postural Control Postural Control: Deficits on evaluation  Balance Balance Balance Assessed: Yes Static Sitting Balance Static Sitting - Balance Support: No upper extremity supported;Feet supported Static Sitting - Level of Assistance: 7: Independent Dynamic Sitting Balance Dynamic Sitting - Balance Support: No upper extremity supported;Feet supported Dynamic Sitting - Level of Assistance: 7: Independent Static Standing Balance Static Standing - Balance Support: Bilateral upper extremity supported Static Standing - Level of Assistance: 6: Modified independent (Device/Increase time) Dynamic Standing Balance Dynamic Standing - Balance Support: Bilateral upper extremity supported Dynamic Standing - Level of Assistance: 5: Stand by assistance Extremity Assessment  RLE Assessment RLE Assessment: Within Functional Limits RLE Strength Right Hip Flexion: 5/5 Right Knee Flexion: 5/5 Right Knee Extension: 5/5 Right Ankle Dorsiflexion: 5/5 LLE Assessment LLE Assessment: Exceptions to [facility] General Strength Comments: impaired, see below LLE Strength Left Hip Flexion: 5/5 Left Knee Flexion: 4/5 Left Knee Extension: 5/5 Left Ankle Dorsiflexion: 4/5  Nadeen Landau, SPT 06/27/2019, 4:37 PM

## 2019-06-27 NOTE — Progress Notes (Signed)
Occupational Therapy Session Note  Patient Details  Name: Mujahid Jalomo MRN: [id number] Date of Birth: 1985/12/14  Today's Date: 06/27/2019 OT Individual Time: 1400-1445 OT Individual Time Calculation (min): 45 min    Short Term Goals: Week 2:  OT Short Term Goal 1 (Week 2): Pt will transfer wiht S overall consistently with LRAD to toilet OT Short Term Goal 1 - Progress (Week 2): Met OT Short Term Goal 2 (Week 2): Pt will step over shower edge wiht CGA using LRAD OT Short Term Goal 2 - Progress (Week 2): Met OT Short Term Goal 3 (Week 2): Pt will complete LB dressing with S OT Short Term Goal 3 - Progress (Week 2): Met Week 3:  OT Short Term Goal 1 (Week 3): STG=LTG secondary to ELOS  Skilled Therapeutic Interventions/Progress Updates:    OT intervention with focus on bed mobility, standing balance, w/c mobility, and BUE/core strengthening to increase independence with BADLs. All transfers with mod I. W/c mobility with mod I throughout unit. Pt engaged in standing exercises with 1kg ball.  Pt engaged in straight arm raises and diagonals below waist 3 x 10.  Pt commented that their knees were "shaking" at the end of each set. Pt returned to room and transferred to bed. Pt pleased with progress and tentatively ready for discharge tomorrow.  Therapy Documentation Precautions:  Precautions Precautions: Fall Precaution Booklet Issued: No Precaution Comments: watch BP and HR Restrictions Weight Bearing Restrictions: No   Therapy/Group: Individual Therapy  [name] 06/27/2019, 3:10 PM

## 2019-06-27 NOTE — Progress Notes (Addendum)
Occupational Therapy Discharge Summary  Patient Details  Name: Jacob Lambert MRN: [id number] Date of Birth: [birth year]  Patient has met 48 of 11 long term goals due to improved activity tolerance, improved balance, postural control, ability to compensate for deficits, functional use of  RIGHT upper, RIGHT lower, LEFT upper and LEFT lower extremity, improved attention, improved awareness and improved coordination.  Pt made excellent progress with BADLs and IADLs during this admission. Pt is mod I for all bathing/dressing and toileting tasks.  Pt is mod I for all functional transfers.  Pt is mod I for simple kitchen tasks and incorporates compensatory strategies and techniques with all tasks. Pt with ongoing L hand numbness-digits 3-5>1 and 2 with burning in 3rd digit. Pt LUE fatigues quickly with activity but pt using LUE in all functional tasks. Patient to discharge at overall Modified Independent level.  Patient's care partner is independent to provide the necessary physical assistance at discharge.      Recommendation:  Patient will benefit from ongoing skilled OT services in home health setting to continue to advance functional skills in the area of BADL, iADL and Vocation.  Equipment: No equipment provided  Reasons for discharge: treatment goals met and discharge from hospital  Patient/family agrees with progress made and goals achieved: Yes  OT Discharge Vision Baseline Vision/History: Legally blind (R eye) Additional Comments: Leggaly blind in R eye Perception  Perception: Within Functional Limits Praxis Praxis: Intact Cognition Overall Cognitive Status: Within Functional Limits for tasks assessed Arousal/Alertness: Awake/alert Attention: Focused Focused Attention: Appears intact Memory: Appears intact Awareness: Appears intact Problem Solving: Appears intact Safety/Judgment: Appears intact Sensation Sensation Light Touch: Impaired Detail Light Touch Impaired Details:  Impaired LUE (Digits 3-5) Proprioception: Appears Intact Motor  Motor Motor: Tetraplegia Motor - Skilled Clinical Observations: decreased coordination, strength and motor planning in L extremities     Trunk/Postural Assessment  Cervical Assessment Cervical Assessment: Within Functional Limits Thoracic Assessment Thoracic Assessment: Within Functional Limits Lumbar Assessment Lumbar Assessment: Within Functional Limits  Balance Static Sitting Balance Static Sitting - Balance Support: Feet supported Static Sitting - Level of Assistance: 7: Independent Dynamic Sitting Balance Dynamic Sitting - Balance Support: During functional activity Dynamic Sitting - Level of Assistance: 6: Modified independent (Device/Increase time) Extremity/Trunk Assessment RUE Assessment RUE Assessment: Within Functional Limits LUE Assessment LUE Assessment: Exceptions to [facility] General Strength Comments: decreased coordination/strength with nerve damage impacting digits 3-5; decreased sensation in digits 3-5 with burning sensation in digit 3   Jacob Lambert 06/27/2019, 7:05 AM

## 2019-06-28 DIAGNOSIS — D497 Neoplasm of unspecified behavior of endocrine glands and other parts of nervous system: Secondary | ICD-10-CM

## 2019-06-28 NOTE — Progress Notes (Signed)
Recreational Therapy Discharge Summary Patient Details  Name: Jacob Lambert MRN: [id number] Date of Birth: 1985-01-19 Today's Date: 06/28/2019  Long term goals set: 1  Long term goals met: 1  Comments on progress toward goals: Pt is discharging home today with family at Mod I w/c level.  TR sessions focused on activity analysis identifying potential adaptations in regards to place of residence, work, travel, leisure and community.  Pt  problem solves through barriers including physical, emotional & social with Mod I.  Pt remains motivated to regain further independence and to continue with an active role in their work and community.  Reasons goals not met: n/a  Reasons for discharge: discharge from hospital  Patient/family agrees with progress made and goals achieved: Yes  [name] 06/28/2019, 8:33 AM

## 2019-06-28 NOTE — Consult Note (Signed)
Neuropsychological Consultation   Patient:   Jacob Lambert   DOB:   [birth year]  MR Number:  [id number]  Location:  [facility] [id number] [place] [address] Dept: [place]: [phone]           Date of Service:   06/27/2019  Start Time:   3 PM End Time:   4:30 PM  Provider/Observer:  Ilean Skill, Psy.D.       Clinical Neuropsychologist       Billing Code/Service: 96158/96159  Chief Complaint:    Jacob Lambert is a 34 year old right-handed nonbinary individual with a history of tobacco abuse, anxiety and depression as well as headaches.  The patient has recently been living with parents due to recent medical needs but had prior lived in [place] as well as overseas.  The patient presented on 06/01/2019 with progressive ataxia and leg weakness since February 2020.  Recent MRI of the brain was negative.  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  Patient has been followed by Cecil Cobbs, MD medical oncologist as well as neurosurgery.  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  Final pathology grade 2 ependyoma.  Patient has residual muscle spasms and motor deficits and bowel disruption.  Patient was admitted for the comprehensive rehabilitation program.  06/16/19:  Patient is in an improved mood and seeing functional gains in mobility.  Still with significant deficits but improving and dealing with ongoing medical decisions about when radiation and other further medical timing.  06/27/2019: The patient has made significant progress as far as transfers and motor functioning.  They is now able to stand and walk for short distances but still relies on wheelchair for any endurance demanding walking.  The patient reports that they had a very stressful event over the weekend where the patient and the therapist came up  on an elevator that had a derogatory word written on an elevator that was quite offensive and caused a great deal of stress to the patient.  The patient reached out towards hospital management to get clarity on how they would respond.  The patient has a lot of frustrations and emotional distress over this event that the patient is having trouble coping with.  Reason for Service:  The patient was referred for neuropsychological consultation due to coping and adjustment issues following significant motor deficits following the removal of a spinal cord tumor with residual motor deficits.  Below is the HPI for the current admission.  HPI: Jacob Lambert is a 34 year old right-handed individual with history of tobacco abuse, anxiety depression as well as headaches.  Per chart review patient is presently living with their parents although they does have longer term residencies both in [place] and [place].  1 level home 2 steps to entry independent with assistive device.  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  Father is an ED physician at [facility] [employer] [facility].  Presented 06/01/2019 with progressive ataxia and leg weakness since February 2021.  Recent MRI of the brain negative.  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  Patient has been followed by both Dr. Cecil Cobbs medical oncology as well as neurosurgery.  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 Venetia Constable.  Decadron protocol as indicated.  Final pathology grade 2 ependyoma.  Lovenox initiated  for DVT prophylaxis 06/06/2019.  Pain control with the use of scheduled Neurontin 600 mg 3 times daily as well as Lidoderm patches.  They have been using Robaxin and Valium for muscle spasms as needed as well as oxycodone for breakthrough pain.  Bouts of constipation he did receive magnesium citrate with good  results.  Therapy evaluations completed and patient was admitted for a comprehensive rehab program.  Current Status:  06/13/19:  Upon arrival, the patient was laying down in the bed with slight head elevation.  Patient was alert and oriented with good mental status.  The patient was quite talkative and rapport and engagement were easily achieved.  I did inform the patient about the indirect connection I have as patient's father is an ED physician with [facility] and works with my wife.  The patient stated comfort level and I reiterated the issue of patient confidentiality.  The patient was having a great deal of difficulty coping with the sudden change in overall status.  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  Patient has struggled with how information regarding medical status should be communicated to friends and business community overall the social media information release which has been very stressful for the patient.  The patient has been to some degree catastrophizing and identifying acute levels of deficits assuming that all of motor deficits and other symptoms will be permanent at this level.  The patient has been making significant improvements with motor functions but continues to have considerable deficits.  On the other hand, there are legitimate concerns about recurrence of this tumor and significant risk of these types of tumors impacting overall life span globally.  The patient is having to deal with both acute risk as well as long-term concerns regarding diagnosis and treatment.  Concerns about how radiation therapies, if they are conducted, will complicate recovery and to what degree residual deficits will be there going forward.  06/16/2019:  Upon entering room, patient was in bed  patient listening to music they found inspirational.  In positive mood state and described positive improvements and gains being made in therapy.  Alert and oriented.  Motivated to continue with therapies and continue to make functional gains.  Still considerable concerns, appropriately so, about how next steps in medical care will be timed and impact other parts of Jacob's life, including how it will impact patient's company.  06/26/2019: The patient was sitting in the patient's bed upright upon entering the room.  The patient was in apparent good mood and very well oriented with good cognition.  Patient described the events that happened over the weekend and their emotional response.  We worked on processing this event they continue to have a lot of emotional impact on the patient.  The patient is also having to cope with discharge and having to move back with their parents which has its onset of emotionally charged components.  We spent a great deal of time processing through these emotions and working on how they would respond both externally as well as internally.  Behavioral Observation: Jacob Lambert  presents as a 34 y.o.-year-old Left Caucasian Nonbinary individual who appeared Jacob Neville "Jacob"'s stated age. Jacob Councilman "Jacob"'s dress was Appropriate and Jacob Pettey "Jacob" was Well Groomed and Jacob Faughn "Jacob"'s manners were Appropriate to the situation.  Jacob Councilman "Jacob"'s  participation was indicative of Appropriate and Attentive behaviors.  There were physical disabilities noted.  Jacob Councilman "Jacob" displayed an appropriate level of cooperation and motivation.     Interactions:    Active Appropriate and Attentive  Attention:   within normal limits and although there were clearly issues with internal preoccupations.  Memory:   within normal limits; recent and remote memory intact  Visuo-spatial:  not examined  Speech (Volume):  normal  Speech:   normal; normal  Thought Process:  Coherent and  Relevant  Though Content:  WNL; not suicidal and not homicidal  Orientation:   person, place, time/date and situation  Judgment:   Good  Planning:   Fair  Affect:    Anxious and Tearful  Mood:    Dysphoric  Insight:   Good  Intelligence:   very high  Medical History:   Past Medical History:  Diagnosis Date  . Anxiety   . Depression   . Headache   . Pneumonia    "walking"        Abuse/Trauma History: The patient describes a number of situations throughout them life that have been very stressful and traumatic with they particularly with issues around how others in public have dealt with and reacted to they sexual orientation.  Psychiatric History:  No prior psychiatric history beyond issues with depression and anxiety.  The patient has dealt with depression in the past but this is been acutely affected by significant medical issues.  Family Med/Psych History: History reviewed. No pertinent family history.  Impression/DX:  Jacob Lambert is a 34 year old right-handed nonbinary individual with a history of tobacco abuse, anxiety and depression as well as headaches.  The patient has recently been living with parents due to recent medical needs but had prior lived in [place] as well as [place].  The patient presented on 06/01/2019 with progressive ataxia and leg weakness since February 2020.  Recent MRI of the brain was negative.  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  Patient has been followed by Cecil Cobbs, MD medical oncologist as well as neurosurgery.  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  Final pathology grade 2 ependyoma.  Patient has residual muscle spasms and motor deficits and bowel disruption.  Patient was admitted for the comprehensive rehabilitation program.  Upon arrival, the patient was laying down in the bed with slight head elevation.  Patient was alert and oriented with good  mental status.  The patient was quite talkative and rapport and engagement were easily achieved.  I did inform the patient about the indirect connection I have as them father is an ED physician with [facility] and works with my wife.  The patient stated comfort level and I reiterated the issue of patient confidentiality.  The patient was having a great deal of difficulty coping with the sudden change in overall status.  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  Patient has struggled with how information regarding medical status should be communicated to friends and business community overall the social media information release which has been very stressful for the patient.  The patient has been to some degree catastrophizing and identifying acute levels of deficits assuming that all of motor deficits and other symptoms will be permanent at this level.  The patient has been  making significant improvements with motor functions but continues to have considerable deficits.  On the other hand, there are legitimate concerns about recurrence of this tumor and significant risk of these types of tumors impacting overall life span globally.  The patient is having to deal with both acute risk as well as long-term concerns regarding diagnosis and treatment.  Concerns about how radiation therapies, if they are conducted, will complicate recovery and to what degree residual deficits will be there going forward.  06/16/2019:  Upon entering room, patient was in bed patient listening to music they found inspirational.  In positive mood state and described positive improvements and gains being made in therapy.  Alert and oriented.  Motivated to continue with therapies and continue to make functional gains.  Still  considerable concerns, appropriately so, about how next steps in medical care will be timed and impact other parts of Jacob's life, including how it will impact patient's company.  Worked on coping and dealing with complicated hospital/medical course.  06/26/2019: The patient was sitting in the patient's bed upright upon entering the room.  The patient was in apparent good mood and very well oriented with good cognition.  Patient described the events that happened over the weekend and their emotional response.  We worked on processing this event they continue to have a lot of emotional impact on the patient.  The patient is also having to cope with discharge and having to move back with their parents which has its onset of emotionally charged components.  We spent a great deal of time processing through these emotions and working on how they would respond both externally as well as internally.  Disposition/Plan:  Patient being discharged Tomorrow.        Electronically Signed   _______________________ Ilean Skill, Psy.D.

## 2019-06-28 NOTE — Patient Care Conference (Signed)
Inpatient RehabilitationTeam Conference and Plan of Care Update Date: 06/28/2019   Time: 10:12 AM    Patient Name: Jacob Lambert      Medical Record Number: [id number]  Date of Birth: 13-Apr-1985 Sex: Adult         Room/Bed: 4W11C/4W11C-01 Payor Info: Payor: BLUE CROSS BLUE SHIELD / Plan: BCBS COMM PPO / Product Type: *No Product type* /    Admit Date/Time:  06/08/2019  4:37 PM  Primary Diagnosis:  Quadriplegia and quadriparesis ([place])  Patient Active Problem List   Diagnosis Date Noted  . Spinal cord tumor   . Pain   . Transaminitis   . Leukocytosis   . Neurogenic bowel   . Postoperative pain   . Ependymoma ([place]) 06/08/2019  . Quadriplegia and quadriparesis ([place])   . 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 Numbness 05/10/2019  . Weakness of left lower extremity 05/10/2019  . 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 Visual disturbance 05/10/2019    Expected Discharge Date: Expected Discharge Date: 06/28/19  Team Members Present: Physician leading conference: Dr. Courtney Heys Care Coodinator Present: Loralee Pacas, LCSWA;Thomas Rhude Creig Hines, RN, BSN, CRRN Nurse Present: Ellison Carwin, LPN PT Present: Excell Seltzer, PT OT Present: Willeen Cass, OT;Roanna Epley, COTA SLP Present: Charolett Bumpers, SLP PPS Coordinator present : Gunnar Fusi, SLP     Current Status/Progress Goal Weekly Team Focus  Bowel/Bladder   Continent of bladder.  Bowel program in place. Receives suppository which is required to evacuate bowels. Last bm 6.19.21.  Maintain continence of bladder. Continue orders of Bowel Program for effective evacuation.  Bowel program teaching prior to discharge.   Swallow/Nutrition/ Hydration             ADL's   functional transfers-mod I, functional amb with RW-supervision/CGA; bathing-dressing-supervision/mod I; standing balance-mod I  mod I overall  discharge planning, safety awareness, education   Mobility   Independent with bed mobility ,supervision for transfers,  ambulation with RW and CGA, stairs with B/L HRs and CGA, independent wit W/C  Mod I overall with supervision for stairs  HEP, d/c planning   Communication             Safety/Cognition/ Behavioral Observations            Pain   c/o pain to neck and back. Pain is effectively relieved by oxycodone IR 10mg .  Continue to assess and treat pain as needed.  Pain management teaching prior to discharge.   Skin   Skin issues include surgical incision mid back covered with dressing, dry and intact. Braden score= 19 with interventions in place to prevent breakdown.  Maintain skin integrity. Continue to monitor for s/s of breakdown. Keep incisional dressing clean, dry, and intact.  Skin integrity maintenance and incisional wound care maintenance teaching prior to discharge.    Rehab Goals Patient on target to meet rehab goals: Yes *See Care Plan and progress notes for long and short-term goals.     Barriers to Discharge  Current Status/Progress Possible Resolutions Date Resolved   Nursing                  PT  Medical stability;Incontinence;Pending chemo/radiation                 OT                  SLP                Care Coordinator Decreased caregiver support;Lack of/limited family support  Discharge Planning/Teaching Needs:  D/c to home with parents with 24/7 care. Both parents continue to work; work schedules create a balance for pt to have support needed.  Family education as recommended   Team Discussion:  Bowel program inconsistent. Explained that it needs to be 30 minutes after dinner every night. Goal level with OT. PT mod I with bed transfers, supervision transfers, contact guard with RW, I with WC. Should achieve mod I goals. Discharge on Lovenox with education done.   Revisions to Treatment Plan:  none    Medical Summary Current Status: LBM last night- didn't get til 10 pm/bowel program- per their request- continent of bladder- no pain meds given today Weekly  Focus/Goal: PT- bed mod I; superviison transfers; CGA- with RW/; I with W/C; goals mod I- should achive  Barriers to Discharge: Home enviroment access/layout;Incontinence;Neurogenic Bowel & Bladder;Weight bearing restrictions  Barriers to Discharge Comments: d/c tomorrow Possible Resolutions to Barriers: OT- at goal level- mod I- some kitchen activities- esp cutting/microwave, etc   Continued Need for Acute Rehabilitation Level of Care: The patient requires daily medical management by a physician with specialized training in physical medicine and rehabilitation for the following reasons: Direction of a multidisciplinary physical rehabilitation program to maximize functional independence : Yes Medical management of patient stability for increased activity during participation in an intensive rehabilitation regime.: Yes Analysis of laboratory values and/or radiology reports with any subsequent need for medication adjustment and/or medical intervention. : Yes   I attest that I was present, lead the team conference, and concur with the assessment and plan of the team.   Cristi Loron 06/28/2019, 10:12 AM

## 2019-06-28 NOTE — Progress Notes (Signed)
This nurse explained and demonstrated Bowel Program including suppository insertion and digital stimulation with return demonstration and verbalization of understanding.

## 2019-06-28 NOTE — Progress Notes (Addendum)
Patient discharged home with parents. Instructions were giving to the patient and  parents by Silvestre Mesi, Pa.  Neither the patient or parents had any questions. Adria Devon, LPN

## 2019-06-28 NOTE — Progress Notes (Signed)
Inpatient Rehabilitation Care Coordinator  Discharge Note  The overall goal for the admission was met for:   Discharge location: Yes. D/c to parent's home.   Length of Stay: Yes. 19 days.   Discharge activity level: Yes. Min A   Home/community participation: Yes. Limited.   Services provided included: MD, RD, PT, OT, RN, CM, TR, Pharmacy, Neuropsych and SW  Financial Services: Private Insurance: BCBS  Follow-up services arranged: Home Health: [facility] for PT/OT and DME: [employer] for wheelchair; pt purchased RW  Comments (or additional information): contact pt #[phone]  Patient/Family verbalized understanding of follow-up arrangements: Yes  Individual responsible for coordination of the follow-up plan: Pt to have assistance with coordinating care needs.  Confirmed correct DME delivered: [name] A Chamberlain 06/28/2019    [name] A Chamberlain 

## 2019-06-30 ENCOUNTER — Institutional Professional Consult (permissible substitution): Payer: BC Managed Care – PPO | Admitting: Radiation Oncology

## 2019-06-30 ENCOUNTER — Ambulatory Visit: Payer: BC Managed Care – PPO | Admitting: Radiation Oncology

## 2019-06-30 ENCOUNTER — Telehealth: Payer: Self-pay

## 2019-06-30 DIAGNOSIS — Z483 Aftercare following surgery for neoplasm: Secondary | ICD-10-CM | POA: Diagnosis not present

## 2019-06-30 DIAGNOSIS — F329 Major depressive disorder, single episode, unspecified: Secondary | ICD-10-CM | POA: Diagnosis not present

## 2019-06-30 DIAGNOSIS — F419 Anxiety disorder, unspecified: Secondary | ICD-10-CM | POA: Diagnosis not present

## 2019-06-30 DIAGNOSIS — C720 Malignant neoplasm of spinal cord: Secondary | ICD-10-CM | POA: Diagnosis not present

## 2019-06-30 DIAGNOSIS — K5900 Constipation, unspecified: Secondary | ICD-10-CM | POA: Diagnosis not present

## 2019-06-30 DIAGNOSIS — G992 Myelopathy in diseases classified elsewhere: Secondary | ICD-10-CM | POA: Diagnosis not present

## 2019-06-30 NOTE — Telephone Encounter (Signed)
LVM FOR PTN REGARDING APPT WITH KR - [facility] HAS NO OPENINGS UNTIL END OF MONTH - IF THIS IS NOT OKAY - RETURN CALL AND WE WILL MOVE TO END OF MONTH LK

## 2019-07-03 ENCOUNTER — Telehealth: Payer: Self-pay

## 2019-07-04 DIAGNOSIS — Z483 Aftercare following surgery for neoplasm: Secondary | ICD-10-CM | POA: Diagnosis not present

## 2019-07-04 DIAGNOSIS — K5900 Constipation, unspecified: Secondary | ICD-10-CM | POA: Diagnosis not present

## 2019-07-04 DIAGNOSIS — F419 Anxiety disorder, unspecified: Secondary | ICD-10-CM | POA: Diagnosis not present

## 2019-07-04 DIAGNOSIS — F329 Major depressive disorder, single episode, unspecified: Secondary | ICD-10-CM | POA: Diagnosis not present

## 2019-07-04 DIAGNOSIS — C720 Malignant neoplasm of spinal cord: Secondary | ICD-10-CM | POA: Diagnosis not present

## 2019-07-04 DIAGNOSIS — G992 Myelopathy in diseases classified elsewhere: Secondary | ICD-10-CM | POA: Diagnosis not present

## 2019-07-04 NOTE — Telephone Encounter (Signed)
Verbal okay given to [facility] visits  for PT once a week for 6 weeks. [facility] notes reviewed).

## 2019-07-05 DIAGNOSIS — K5900 Constipation, unspecified: Secondary | ICD-10-CM | POA: Diagnosis not present

## 2019-07-05 DIAGNOSIS — C720 Malignant neoplasm of spinal cord: Secondary | ICD-10-CM | POA: Diagnosis not present

## 2019-07-05 DIAGNOSIS — F419 Anxiety disorder, unspecified: Secondary | ICD-10-CM | POA: Diagnosis not present

## 2019-07-05 DIAGNOSIS — Z483 Aftercare following surgery for neoplasm: Secondary | ICD-10-CM | POA: Diagnosis not present

## 2019-07-05 DIAGNOSIS — G992 Myelopathy in diseases classified elsewhere: Secondary | ICD-10-CM | POA: Diagnosis not present

## 2019-07-05 DIAGNOSIS — F329 Major depressive disorder, single episode, unspecified: Secondary | ICD-10-CM | POA: Diagnosis not present

## 2019-07-07 ENCOUNTER — Other Ambulatory Visit: Payer: Self-pay

## 2019-07-07 ENCOUNTER — Encounter
Payer: BC Managed Care – PPO | Attending: Physical Medicine and Rehabilitation | Admitting: Physical Medicine and Rehabilitation

## 2019-07-07 ENCOUNTER — Encounter: Payer: Self-pay | Admitting: Physical Medicine and Rehabilitation

## 2019-07-07 VITALS — BP 116/84 | HR 100 | Temp 98.6°F | Ht 70.5 in | Wt 153.0 lb

## 2019-07-07 DIAGNOSIS — K592 Neurogenic bowel, not elsewhere classified: Secondary | ICD-10-CM

## 2019-07-07 DIAGNOSIS — C720 Malignant neoplasm of spinal cord: Secondary | ICD-10-CM | POA: Diagnosis not present

## 2019-07-07 DIAGNOSIS — N529 Male erectile dysfunction, unspecified: Secondary | ICD-10-CM | POA: Diagnosis not present

## 2019-07-07 DIAGNOSIS — R519 Headache, unspecified: Secondary | ICD-10-CM

## 2019-07-07 MED ORDER — CITALOPRAM HYDROBROMIDE 40 MG PO TABS
40.0000 mg | ORAL_TABLET | Freq: Every day | ORAL | 3 refills | Status: DC
Start: 2019-07-07 — End: 2019-08-18

## 2019-07-07 NOTE — Progress Notes (Addendum)
Subjective:    Patient ID: Jacob Lambert, adult    DOB: [birth year], 34 y.o.   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   They were able to get their nails done yesterday and this was their first trip out for them. They felt that they were coming out again, now with a disability.   Their parents are concerned that they are not eating enough. They want him to put on more muscle mass prior to radiation. They have been trying to eat healthy foods.   They have lose 20 lbs since going to the ER, but almost all of this was COVID weight gain.   They have had a bowel movement on their own every day since leaving the hospital. There was one accident where they did not make it to the bathroom. 2 nights they went at night and chose not to do the suppository.   They have been using Sildenafil which works to certain extent. They are having orgasms and ejaculations. When they have this they have a shooting pain down left leg. It lasts a couple minutes or 10-15 minutes after- it is burning pain.They are no longer having the tremors of the lower extremities.   They now need assistance to have an orgasm- lube- still has impaired sensation.   The topiramate helps with their migraines.   They would like to try a stronger antidepressant.   Left 3rd finger pain is much improved with CBD oil.   Pain Inventory Average Pain 3 Pain Right Now 5 My pain is intermittent, constant, sharp, burning, tingling and aching  In the last 24 hours, has pain interfered with the following? General activity 5 Relation with others 2 Enjoyment of life 5 What TIME of day is your pain at its worst? all Sleep (in general) Fair  Pain is worse with: unsure Pain improves with: n/a Relief from Meds: n/a  Mobility use a walker use a wheelchair transfers alone  Function employed # of hrs/week . I need assistance with the following:   toileting and meal prep  Neuro/Psych bowel control problems weakness numbness tremor tingling trouble walking spasms dizziness depression anxiety  Prior Studies hospital f/u  Physicians involved in your care hospital f/u   History reviewed. No pertinent family history. Social History   Socioeconomic History  . Marital status: Single    Spouse name: Not on file  . Number of children: Not on file  . Years of education: Not on file  . Highest education level: Not on file  Occupational History  . Not on file  Tobacco Use  . Smoking status: Former Research scientist (life sciences)  . Smokeless tobacco: Never Used  . Tobacco comment: none in years   Vaping Use  . Vaping Use: Never used  Substance and Sexual Activity  . Alcohol use: Yes    Comment: 2 bottles of wine per week, Maybe 4 - 8 shots aweek  . Drug use: Not Currently    Comment: CDB  . Sexual activity: Yes  Other Topics Concern  . Not on file  Social History Narrative   Lives with parents   Caffeine use: minimum- 3 cups per day (on average 6-10 cups per day)   Left handed    Social Determinants of Health   Financial Resource Strain:   . Difficulty of Paying Living Expenses:   Food Insecurity:   . Worried About Charity fundraiser in the  Last Year:   . [place] in the Last Year:   Transportation Needs:   . Film/video editor (Medical):   Marland Kitchen Lack of Transportation (Non-Medical):   Physical Activity:   . Days of Exercise per Week:   . Minutes of Exercise per Session:   Stress:   . Feeling of Stress :   Social Connections:   . Frequency of Communication with Friends and Family:   . Frequency of Social Gatherings with Friends and Family:   . Attends Religious Services:   . Active Member of Clubs or Organizations:   . Attends Archivist Meetings:   Marland Kitchen Marital Status:    Past Surgical History:  Procedure Laterality Date  . LAMINECTOMY N/A 06/01/2019   Procedure: Cervical six-seven Laminoplasty, biopsy  and resection of intramedullary spinal cord tumor;  Surgeon: [name], MD;  Location: [facility];  Service: Neurosurgery;  Laterality: N/A;  . NO PAST SURGERIES     Past Medical History:  Diagnosis Date  . Anxiety   . Depression   . Headache   . Pneumonia    "walking"   BP 116/84   Pulse 100   Temp 98.6 F (37 C)   Ht 5' 10.5" (1.791 m)   Wt 153 lb (69.4 kg)   SpO2 98%   BMI 21.64 kg/m   Opioid Risk Score:   Fall Risk Score:  `1  Depression screen PHQ 2/9  No flowsheet data found.  Review of Systems  Constitutional: Negative.   Respiratory: Negative.   Cardiovascular: Negative.   Gastrointestinal: Negative.   Endocrine: Negative.   Musculoskeletal: Positive for gait problem.       Spasms\  Allergic/Immunologic: Negative.   Neurological: Positive for tremors and weakness. Negative for dizziness.       Tingling   Hematological: Negative.   Psychiatric/Behavioral: Positive for dysphoric mood. The patient is nervous/anxious.   All other systems reviewed and are negative.      Objective:   Physical Exam  Gen: no distress, normal appearing HEENT: oral mucosa pink and moist, NCAT Cardio: Reg rate Chest: normal effort, normal rate of breathing Abd: soft, non-distended Ext: no edema Skin: intact Motor:  RUE: 4+/5 in deltoid, biceps, triceps, WE, grip and finger abd LUE:4+/5 in same muscles except 3-/5 in finger abd RLE: HF 4+/5, KE 5-/5, DF and PF 5-/5 LLE: HF 4/5, KE 5-/5, DF and PF 5-/5 No clonus.  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   Discussed that they wish to get a lighter wheelchair for improved mobility within their home. I agree that it would be good for them to have both wheelchairs.   Pain in left 3rd digit is  much improved with CBD oil.  Depression: Increase Celexa to 40mg . His mood is good, but he still has depressed mood at times.   Constipation: Well controlled. Continue nightly bowel program.  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   Discussed that current weight is healthy and he should continue to eat healthy foods.  Discussed that he should join a cancer and SCI support group.  Discussed finding cancer trials for him to be a part of.  Discussed sexual function- Viagra currently working well.   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 We discussed neurogenic bowel sexual function, pain, quality of life, radiation treatment, physical and occupational therapy, depression, wheelchair, cancer trials, support groups. Increased depression medication. Greater than 1 hour spent in discussion with patient.  AddendumAndie Lambert would benefit from a 16 inch by 16 inch lightweight wheelchair for easier maneuverability given their frame size, high function, and goal to return to work. Script faxed 7/21 after discussion with father.

## 2019-07-13 DIAGNOSIS — C720 Malignant neoplasm of spinal cord: Secondary | ICD-10-CM | POA: Diagnosis not present

## 2019-07-13 DIAGNOSIS — F329 Major depressive disorder, single episode, unspecified: Secondary | ICD-10-CM | POA: Diagnosis not present

## 2019-07-13 DIAGNOSIS — F419 Anxiety disorder, unspecified: Secondary | ICD-10-CM | POA: Diagnosis not present

## 2019-07-13 DIAGNOSIS — Z483 Aftercare following surgery for neoplasm: Secondary | ICD-10-CM | POA: Diagnosis not present

## 2019-07-13 DIAGNOSIS — K5900 Constipation, unspecified: Secondary | ICD-10-CM | POA: Diagnosis not present

## 2019-07-13 DIAGNOSIS — G992 Myelopathy in diseases classified elsewhere: Secondary | ICD-10-CM | POA: Diagnosis not present

## 2019-07-14 DIAGNOSIS — G992 Myelopathy in diseases classified elsewhere: Secondary | ICD-10-CM | POA: Diagnosis not present

## 2019-07-14 DIAGNOSIS — Z483 Aftercare following surgery for neoplasm: Secondary | ICD-10-CM | POA: Diagnosis not present

## 2019-07-14 DIAGNOSIS — F329 Major depressive disorder, single episode, unspecified: Secondary | ICD-10-CM | POA: Diagnosis not present

## 2019-07-14 DIAGNOSIS — K5900 Constipation, unspecified: Secondary | ICD-10-CM | POA: Diagnosis not present

## 2019-07-14 DIAGNOSIS — C720 Malignant neoplasm of spinal cord: Secondary | ICD-10-CM | POA: Diagnosis not present

## 2019-07-14 DIAGNOSIS — F419 Anxiety disorder, unspecified: Secondary | ICD-10-CM | POA: Diagnosis not present

## 2019-07-18 ENCOUNTER — Encounter: Payer: Self-pay | Admitting: Family Medicine

## 2019-07-18 ENCOUNTER — Ambulatory Visit (INDEPENDENT_AMBULATORY_CARE_PROVIDER_SITE_OTHER): Payer: BC Managed Care – PPO | Admitting: Family Medicine

## 2019-07-18 ENCOUNTER — Other Ambulatory Visit: Payer: Self-pay

## 2019-07-18 VITALS — BP 108/75 | HR 82 | Temp 98.5°F | Resp 17 | Ht 70.0 in | Wt 152.0 lb

## 2019-07-18 DIAGNOSIS — C720 Malignant neoplasm of spinal cord: Secondary | ICD-10-CM

## 2019-07-18 DIAGNOSIS — Z7689 Persons encountering health services in other specified circumstances: Secondary | ICD-10-CM

## 2019-07-18 DIAGNOSIS — R258 Other abnormal involuntary movements: Secondary | ICD-10-CM

## 2019-07-18 DIAGNOSIS — D497 Neoplasm of unspecified behavior of endocrine glands and other parts of nervous system: Secondary | ICD-10-CM | POA: Diagnosis not present

## 2019-07-18 DIAGNOSIS — G8250 Quadriplegia, unspecified: Secondary | ICD-10-CM

## 2019-07-18 DIAGNOSIS — Z09 Encounter for follow-up examination after completed treatment for conditions other than malignant neoplasm: Secondary | ICD-10-CM

## 2019-07-18 DIAGNOSIS — K592 Neurogenic bowel, not elsewhere classified: Secondary | ICD-10-CM

## 2019-07-18 DIAGNOSIS — R52 Pain, unspecified: Secondary | ICD-10-CM

## 2019-07-18 LAB — POCT GLYCOSYLATED HEMOGLOBIN (HGB A1C)
HbA1c POC (<> result, manual entry): 5.45 % (ref 4.0–5.6)
HbA1c, POC (controlled diabetic range): 5.4 % (ref 0.0–7.0)
HbA1c, POC (prediabetic range): 5.4 % — AB (ref 5.7–6.4)
Hemoglobin A1C: 5.4 % (ref 4.0–5.6)

## 2019-07-18 NOTE — Progress Notes (Signed)
Patient Jacob Lambert and [facility]   Established Patient Office Visit  Subjective:  Patient ID: Jacob Lambert, adult    DOB: [birth year]  Age: 34 y.o. MRN: [id number]  CC:  Chief Complaint  Patient presents with  . New Patient (Initial Visit)    Pt states he is here to start services. Because he moved here .because of cancer in his spine.    HPI Jacob Lambert is a 34 year old nonbinary person who presents for Follow Up today.   Patient Active Problem List   Diagnosis Date Noted  . Spinal cord tumor   . Pain   . Transaminitis   . Leukocytosis   . Neurogenic bowel   . Postoperative pain   . 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 Quadriplegia and quadriparesis ([place])   . Cervical spinal cord ependymoma ([place]) 05/23/2019  . Numbness 05/10/2019  . Weakness of left lower extremity 05/10/2019  . 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 Visual disturbance 05/10/2019    Past Medical History:  Diagnosis Date  . Anxiety   . Depression   . Headache   . Pneumonia    "walking"  . Quadriplegia and quadriparesis ([place])   . Spasticity   . Spinal cord tumor   . Wheelchair bound    Current Status: This will be Jacob Lambert initial office visit with me. He is currently followed by Oncology. Patient is scheduled for radiation treatment in 08/2019 X 3 weeks, per Dr. Tammi Klippel, for cervical spinal tumor. He denies fevers, chills, fatigue, recent infections, weight loss, and night sweats. He has not had any headaches, visual changes, dizziness, and falls. No chest pain, heart palpitations, cough and shortness of breath reported. No reports of GI problems such as nausea, vomiting, diarrhea, and constipation. He has no reports of blood in stools, dysuria and hematuria. Mild anxiety reported today. He denies suicidal ideations, homicidal ideations, or auditory hallucinations. He is taking all medications as prescribed.  Past Surgical History:  Procedure Laterality Date  . LAMINECTOMY N/A  06/01/2019   Procedure: Cervical six-seven Laminoplasty, biopsy and resection of intramedullary spinal cord tumor;  Surgeon: [name], MD;  Location: [facility];  Service: Neurosurgery;  Laterality: N/A;  . NO PAST SURGERIES      No family history on file.  Social History   Socioeconomic History  . Marital status: Single    Spouse name: Not on file  . Number of children: Not on file  . Years of education: Not on file  . Highest education level: Not on file  Occupational History  . Not on file  Tobacco Use  . Smoking status: Former Research scientist (life sciences)  . Smokeless tobacco: Never Used  . Tobacco comment: none in years   Vaping Use  . Vaping Use: Never used  Substance and Sexual Activity  . Alcohol use: Yes    Comment: 2 bottles of wine per week, Maybe 4 - 8 shots aweek  . Drug use: Not Currently    Comment: CDB  . Sexual activity: Yes  Other Topics Concern  . Not on file  Social History Narrative   Lives with parents   Caffeine use: minimum- 3 cups per day (on average 6-10 cups per day)   Left handed    Social Determinants of Health   Financial Resource Strain:   . Difficulty of Paying Living Expenses:   Food Insecurity:   . Worried About Charity fundraiser in the Last Year:   . Ran  Out of Food in the Last Year:   Transportation Needs:   . Lack of Transportation (Medical):   Marland Kitchen Lack of Transportation (Non-Medical):   Physical Activity:   . Days of Exercise per Week:   . Minutes of Exercise per Session:   Stress:   . Feeling of Stress :   Social Connections:   . Frequency of Communication with Friends and Family:   . Frequency of Social Gatherings with Friends and Family:   . Attends Religious Services:   . Active Member of Clubs or Organizations:   . Attends Archivist Meetings:   Marland Kitchen Marital Status:   Intimate Partner Violence:   . Fear of Current or Ex-Partner:   . Emotionally Abused:   Marland Kitchen Physically Abused:   . Sexually Abused:     Outpatient  Medications Prior to Visit  Medication Sig Dispense Refill  . acetaminophen (TYLENOL) 325 MG tablet Take 2 tablets (650 mg total) by mouth every 4 (four) hours as needed for mild pain ((score 1 to 3) or temp > 100.5).    . Baclofen 5 MG TABS Take 15 mg by mouth 4 (four) times daily. 210 tablet 1  . bisacodyl (DULCOLAX) 10 MG suppository Place 1 suppository (10 mg total) rectally at bedtime. 12 suppository 0  . citalopram (CELEXA) 40 MG tablet Take 1 tablet (40 mg total) by mouth daily. 30 tablet 3  . diazepam (VALIUM) 10 MG tablet Take 1 tablet (10 mg total) by mouth every 6 (six) hours as needed. 30 tablet 0  . docusate sodium (COLACE) 100 MG capsule Take 1 capsule (100 mg total) by mouth 3 (three) times daily. 10 capsule 0  . enoxaparin (LOVENOX) 40 MG/0.4ML injection lovenox 40 mg daily SQ until 08/01/2019 and stop 0.4 mL 1  . gabapentin (NEURONTIN) 300 MG capsule Take 2 capsules (600 mg total) by mouth 3 (three) times daily. 180 capsule 1  . lidocaine (LIDODERM) 5 % Place 1 patch onto the skin daily. Remove & Discard patch within 12 hours or as directed by MD 30 patch 0  . polyethylene glycol (MIRALAX / GLYCOLAX) 17 g packet Take 17 g by mouth 3 (three) times daily. 14 each 0  . senna-docusate (SENOKOT-S) 8.6-50 MG tablet Take 2 tablets by mouth at bedtime.    . sildenafil (VIAGRA) 50 MG tablet Take 1 tablet (50 mg total) by mouth as needed for erectile dysfunction. 20 tablet 1  . topiramate (TOPAMAX) 25 MG tablet Take 1 tablet (25 mg total) by mouth at bedtime. 30 tablet 0  . diclofenac Sodium (VOLTAREN) 1 % GEL Apply 2 g topically 4 (four) times daily. (Patient not taking: Reported on 07/18/2019) 2 g 1  . methocarbamol (ROBAXIN) 750 MG tablet Take 1 tablet (750 mg total) by mouth every 6 (six) hours as needed for muscle spasms. (Patient not taking: Reported on 07/18/2019) 30 tablet 0  . Multiple Vitamin (MULTIVITAMIN WITH MINERALS) TABS tablet Take 1 tablet by mouth daily. (Patient not taking:  Reported on 07/18/2019)    . oxyCODONE 10 MG TABS Take 1 tablet (10 mg total) by mouth every 4 (four) hours as needed for severe pain ((score 7 to 10)). (Patient not taking: Reported on 07/18/2019) 30 tablet 0   No facility-administered medications prior to visit.    Allergies  Allergen Reactions  . Cephalosporins Hives  . Other     Patagonion toothfish    ROS Review of Systems  Constitutional: Positive for fatigue (increased. ).  HENT:  Negative.   Eyes: Negative.   Respiratory: Negative.   Cardiovascular: Negative.   Gastrointestinal: Positive for diarrhea (occasional).       Occasional bowel and urinary incontinence.   Endocrine: Negative.   Genitourinary: Negative.   Musculoskeletal: Negative.        Post surgical pain at cervical site and radiating to arms.   Skin: Negative.   Allergic/Immunologic: Negative.   Neurological: Positive for weakness (generalized) and numbness (quadriplegia).  Hematological: Negative.   Psychiatric/Behavioral: Negative.    Objective:    Physical Exam Vitals and nursing note reviewed.  Constitutional:      Comments: Quadriplegia Wheelchair bound  HENT:     Head: Normocephalic and atraumatic.     Nose: Nose normal.     Mouth/Throat:     Mouth: Mucous membranes are moist.     Pharynx: Oropharynx is clear.  Cardiovascular:     Rate and Rhythm: Normal rate and regular rhythm.     Pulses: Normal pulses.     Heart sounds: Normal heart sounds.  Pulmonary:     Effort: Pulmonary effort is normal.     Breath sounds: Normal breath sounds.  Abdominal:     General: Bowel sounds are normal.     Palpations: Abdomen is soft.  Musculoskeletal:     Cervical back: Neck supple.     Comments: Spinal cord tumor Quadriplegia Limited ROM  Skin:    General: Skin is warm and dry.  Neurological:     Mental Status: X[place] Croom "B" is alert.     Sensory: Sensory deficit present.     Motor: Weakness present.     Gait: Gait abnormal.     Deep  Tendon Reflexes: Reflexes abnormal.  Psychiatric:        Mood and Affect: Mood normal.        Behavior: Behavior normal.        Thought Content: Thought content normal.        Judgment: Judgment normal.    BP 108/75 (BP Location: Right Arm, Patient Position: Sitting, Cuff Size: Normal)   Pulse 82   Temp 98.5 F (36.9 C)   Resp 17   Ht 5\' 10"  (1.778 m)   Wt 152 lb (68.9 kg)   SpO2 99%   BMI 21.81 kg/m  Wt Readings from Last 3 Encounters:  07/18/19 152 lb (68.9 kg)  07/07/19 153 lb (69.4 kg)  06/09/19 155 lb 10.3 oz (70.6 kg)     Health Maintenance Due  Topic Date Due  . Hepatitis C Screening  Never done  . COVID-19 Vaccine (1) Never done  . HIV Screening  Never done  . TETANUS/TDAP  Never done  . PAP SMEAR-Modifier  Never done    There are no preventive care reminders to display for this patient.  No results found for: TSH Lab Results  Component Value Date   WBC 8.4 06/27/2019   HGB 13.2 06/27/2019   HCT 41.8 06/27/2019   MCV 87.1 06/27/2019   PLT 284 06/27/2019   Lab Results  Component Value Date   NA 140 06/27/2019   K 4.0 06/27/2019   CO2 27 06/27/2019   GLUCOSE 93 06/27/2019   BUN 11 06/27/2019   CREATININE 1.17 06/27/2019   BILITOT 0.3 06/12/2019   ALKPHOS 124 06/12/2019   AST 58 (H) 06/12/2019   ALT 195 (H) 06/12/2019   PROT 8.0 06/12/2019   ALBUMIN 3.0 (L) 06/12/2019   CALCIUM 9.5 06/27/2019   ANIONGAP 8 06/27/2019  No results found for: CHOL No results found for: HDL No results found for: LDLCALC No results found for: TRIG No results found for: CHOLHDL No results found for: HGBA1C    Assessment & Plan:   1. Hospital discharge follow-up  2. Encounter to establish care - POC HgB A1c  3. Spinal cord tumor  4. Cervical spinal cord ependymoma ([place])  5. Quadriplegia and quadriparesis ([facility])  6. Pain  7. Neurogenic bowel 2 bowel movements daily.   8. Clonus Lower extremity; increased in the morning.  9. Follow up He will  follow up in 6 months.   No orders of the defined types were placed in this encounter.   Orders Placed This Encounter  Procedures  . POC HgB A1c    Kathe Becton,  MSN, [name]-BC [facility] [address] [place], [place] [address] [phone] fax  Problem List Items Addressed This Visit      Digestive   Neurogenic bowel     Nervous and Auditory   Cervical spinal cord ependymoma ([place])   Quadriplegia and quadriparesis [facility])   Spinal cord tumor     Other   Pain    Other Visit Diagnoses    Hospital discharge follow-up    -  Primary   Encounter to establish care       Relevant Orders   POC HgB A1c   Clonus       Follow up          No orders of the defined types were placed in this encounter.   Follow-up: No follow-ups on file.    [name], [name]

## 2019-07-19 DIAGNOSIS — G992 Myelopathy in diseases classified elsewhere: Secondary | ICD-10-CM | POA: Diagnosis not present

## 2019-07-19 DIAGNOSIS — K5900 Constipation, unspecified: Secondary | ICD-10-CM | POA: Diagnosis not present

## 2019-07-19 DIAGNOSIS — F329 Major depressive disorder, single episode, unspecified: Secondary | ICD-10-CM | POA: Diagnosis not present

## 2019-07-19 DIAGNOSIS — Z483 Aftercare following surgery for neoplasm: Secondary | ICD-10-CM | POA: Diagnosis not present

## 2019-07-19 DIAGNOSIS — F419 Anxiety disorder, unspecified: Secondary | ICD-10-CM | POA: Diagnosis not present

## 2019-07-19 DIAGNOSIS — C720 Malignant neoplasm of spinal cord: Secondary | ICD-10-CM | POA: Diagnosis not present

## 2019-07-20 DIAGNOSIS — F329 Major depressive disorder, single episode, unspecified: Secondary | ICD-10-CM | POA: Diagnosis not present

## 2019-07-20 DIAGNOSIS — K5900 Constipation, unspecified: Secondary | ICD-10-CM | POA: Diagnosis not present

## 2019-07-20 DIAGNOSIS — C720 Malignant neoplasm of spinal cord: Secondary | ICD-10-CM | POA: Diagnosis not present

## 2019-07-20 DIAGNOSIS — F419 Anxiety disorder, unspecified: Secondary | ICD-10-CM | POA: Diagnosis not present

## 2019-07-20 DIAGNOSIS — G992 Myelopathy in diseases classified elsewhere: Secondary | ICD-10-CM | POA: Diagnosis not present

## 2019-07-20 DIAGNOSIS — Z483 Aftercare following surgery for neoplasm: Secondary | ICD-10-CM | POA: Diagnosis not present

## 2019-07-21 ENCOUNTER — Encounter: Payer: Self-pay | Admitting: Family Medicine

## 2019-07-21 DIAGNOSIS — R258 Other abnormal involuntary movements: Secondary | ICD-10-CM | POA: Insufficient documentation

## 2019-07-26 DIAGNOSIS — K5900 Constipation, unspecified: Secondary | ICD-10-CM | POA: Diagnosis not present

## 2019-07-26 DIAGNOSIS — F329 Major depressive disorder, single episode, unspecified: Secondary | ICD-10-CM | POA: Diagnosis not present

## 2019-07-26 DIAGNOSIS — F419 Anxiety disorder, unspecified: Secondary | ICD-10-CM | POA: Diagnosis not present

## 2019-07-26 DIAGNOSIS — C720 Malignant neoplasm of spinal cord: Secondary | ICD-10-CM | POA: Diagnosis not present

## 2019-07-26 DIAGNOSIS — Z483 Aftercare following surgery for neoplasm: Secondary | ICD-10-CM | POA: Diagnosis not present

## 2019-07-26 DIAGNOSIS — G992 Myelopathy in diseases classified elsewhere: Secondary | ICD-10-CM | POA: Diagnosis not present

## 2019-07-27 DIAGNOSIS — K5900 Constipation, unspecified: Secondary | ICD-10-CM | POA: Diagnosis not present

## 2019-07-27 DIAGNOSIS — F329 Major depressive disorder, single episode, unspecified: Secondary | ICD-10-CM | POA: Diagnosis not present

## 2019-07-27 DIAGNOSIS — Z483 Aftercare following surgery for neoplasm: Secondary | ICD-10-CM | POA: Diagnosis not present

## 2019-07-27 DIAGNOSIS — F419 Anxiety disorder, unspecified: Secondary | ICD-10-CM | POA: Diagnosis not present

## 2019-07-27 DIAGNOSIS — C720 Malignant neoplasm of spinal cord: Secondary | ICD-10-CM | POA: Diagnosis not present

## 2019-07-27 DIAGNOSIS — G992 Myelopathy in diseases classified elsewhere: Secondary | ICD-10-CM | POA: Diagnosis not present

## 2019-07-28 ENCOUNTER — Other Ambulatory Visit: Payer: Self-pay

## 2019-07-28 ENCOUNTER — Ambulatory Visit
Admission: RE | Admit: 2019-07-28 | Discharge: 2019-07-28 | Disposition: A | Discharge status: Home / Self Care | Payer: BC Managed Care – PPO | Source: Ambulatory Visit | Attending: Radiation Oncology | Admitting: Radiation Oncology

## 2019-07-28 DIAGNOSIS — C720 Malignant neoplasm of spinal cord: Secondary | ICD-10-CM | POA: Diagnosis not present

## 2019-07-28 DIAGNOSIS — G8250 Quadriplegia, unspecified: Secondary | ICD-10-CM | POA: Diagnosis not present

## 2019-07-30 NOTE — Progress Notes (Signed)
  Radiation Oncology         [phone] ________________________________  Name: Jacob Lambert MRN: [id number]  Date: 07/28/2019  DOB: April 19, 1985  SIMULATION AND TREATMENT PLANNING NOTE    ICD-10-CM   1. Spinal cord ependymoma (HCC)  C72.0     DIAGNOSIS:  34 y.o. 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  Identity was confirmed.  All relevant records and images related to the planned course of therapy were reviewed.  The patient freely provided informed written consent to proceed with treatment after reviewing the details related to the planned course of therapy. The consent form was witnessed and verified by the simulation staff.  Then, the patient was set-up in a stable reproducible  supine position for radiation therapy.  CT images were obtained.  Surface markings were placed.  The CT images were loaded into the planning software.  Then the target and avoidance structures were contoured.  Treatment planning then occurred.  The radiation prescription was entered and confirmed.  Then, I designed and supervised the construction of a total of 2 medically necessary complex treatment devices including neck cushion and thermoplastic mask.  I have requested : Intensity Modulated Radiotherapy (IMRT) is medically necessary for this case for the following reason:  Critical CNS structure avoidance, parotid sparing, pharyngeal constrictor sparing.  PLAN:  The patient will receive 54 Gy in 30 fractions.  ________________________________  Sheral Apley Tammi Klippel, M.D.

## 2019-08-01 DIAGNOSIS — C720 Malignant neoplasm of spinal cord: Secondary | ICD-10-CM | POA: Diagnosis not present

## 2019-08-02 DIAGNOSIS — K5900 Constipation, unspecified: Secondary | ICD-10-CM | POA: Diagnosis not present

## 2019-08-02 DIAGNOSIS — Z483 Aftercare following surgery for neoplasm: Secondary | ICD-10-CM | POA: Diagnosis not present

## 2019-08-02 DIAGNOSIS — G992 Myelopathy in diseases classified elsewhere: Secondary | ICD-10-CM | POA: Diagnosis not present

## 2019-08-02 DIAGNOSIS — F329 Major depressive disorder, single episode, unspecified: Secondary | ICD-10-CM | POA: Diagnosis not present

## 2019-08-02 DIAGNOSIS — C720 Malignant neoplasm of spinal cord: Secondary | ICD-10-CM | POA: Diagnosis not present

## 2019-08-02 DIAGNOSIS — F419 Anxiety disorder, unspecified: Secondary | ICD-10-CM | POA: Diagnosis not present

## 2019-08-03 DIAGNOSIS — C720 Malignant neoplasm of spinal cord: Secondary | ICD-10-CM | POA: Diagnosis not present

## 2019-08-03 DIAGNOSIS — K5900 Constipation, unspecified: Secondary | ICD-10-CM | POA: Diagnosis not present

## 2019-08-03 DIAGNOSIS — F329 Major depressive disorder, single episode, unspecified: Secondary | ICD-10-CM | POA: Diagnosis not present

## 2019-08-03 DIAGNOSIS — G992 Myelopathy in diseases classified elsewhere: Secondary | ICD-10-CM | POA: Diagnosis not present

## 2019-08-03 DIAGNOSIS — F419 Anxiety disorder, unspecified: Secondary | ICD-10-CM | POA: Diagnosis not present

## 2019-08-03 DIAGNOSIS — Z483 Aftercare following surgery for neoplasm: Secondary | ICD-10-CM | POA: Diagnosis not present

## 2019-08-08 ENCOUNTER — Other Ambulatory Visit: Payer: Self-pay

## 2019-08-08 ENCOUNTER — Ambulatory Visit
Admission: RE | Admit: 2019-08-08 | Discharge: 2019-08-08 | Disposition: A | Discharge status: Home / Self Care | Payer: BC Managed Care – PPO | Source: Ambulatory Visit | Attending: Radiation Oncology | Admitting: Radiation Oncology

## 2019-08-08 DIAGNOSIS — C720 Malignant neoplasm of spinal cord: Secondary | ICD-10-CM | POA: Diagnosis not present

## 2019-08-09 ENCOUNTER — Other Ambulatory Visit: Payer: Self-pay

## 2019-08-09 ENCOUNTER — Ambulatory Visit
Admission: RE | Admit: 2019-08-09 | Discharge: 2019-08-09 | Disposition: A | Discharge status: Home / Self Care | Payer: BC Managed Care – PPO | Source: Ambulatory Visit | Attending: Radiation Oncology | Admitting: Radiation Oncology

## 2019-08-09 DIAGNOSIS — C720 Malignant neoplasm of spinal cord: Secondary | ICD-10-CM | POA: Diagnosis not present

## 2019-08-10 ENCOUNTER — Other Ambulatory Visit: Payer: Self-pay

## 2019-08-10 ENCOUNTER — Ambulatory Visit
Admission: RE | Admit: 2019-08-10 | Discharge: 2019-08-10 | Disposition: A | Discharge status: Home / Self Care | Payer: BC Managed Care – PPO | Source: Ambulatory Visit | Attending: Radiation Oncology | Admitting: Radiation Oncology

## 2019-08-10 DIAGNOSIS — C720 Malignant neoplasm of spinal cord: Secondary | ICD-10-CM | POA: Diagnosis not present

## 2019-08-11 ENCOUNTER — Ambulatory Visit
Admission: RE | Admit: 2019-08-11 | Discharge: 2019-08-11 | Disposition: A | Discharge status: Home / Self Care | Payer: BC Managed Care – PPO | Source: Ambulatory Visit | Attending: Radiation Oncology | Admitting: Radiation Oncology

## 2019-08-11 ENCOUNTER — Other Ambulatory Visit: Payer: Self-pay

## 2019-08-11 DIAGNOSIS — C720 Malignant neoplasm of spinal cord: Secondary | ICD-10-CM | POA: Diagnosis not present

## 2019-08-14 ENCOUNTER — Other Ambulatory Visit: Payer: Self-pay

## 2019-08-14 ENCOUNTER — Ambulatory Visit
Admission: RE | Admit: 2019-08-14 | Discharge: 2019-08-14 | Disposition: A | Discharge status: Home / Self Care | Payer: BC Managed Care – PPO | Source: Ambulatory Visit | Attending: Radiation Oncology | Admitting: Radiation Oncology

## 2019-08-14 DIAGNOSIS — C720 Malignant neoplasm of spinal cord: Secondary | ICD-10-CM | POA: Diagnosis not present

## 2019-08-15 ENCOUNTER — Other Ambulatory Visit: Payer: Self-pay

## 2019-08-15 ENCOUNTER — Ambulatory Visit
Admission: RE | Admit: 2019-08-15 | Discharge: 2019-08-15 | Disposition: A | Discharge status: Home / Self Care | Payer: BC Managed Care – PPO | Source: Ambulatory Visit | Attending: Radiation Oncology | Admitting: Radiation Oncology

## 2019-08-15 DIAGNOSIS — C720 Malignant neoplasm of spinal cord: Secondary | ICD-10-CM | POA: Diagnosis not present

## 2019-08-16 ENCOUNTER — Other Ambulatory Visit: Payer: Self-pay

## 2019-08-16 ENCOUNTER — Ambulatory Visit
Admission: RE | Admit: 2019-08-16 | Discharge: 2019-08-16 | Disposition: A | Discharge status: Home / Self Care | Payer: BC Managed Care – PPO | Source: Ambulatory Visit | Attending: Radiation Oncology | Admitting: Radiation Oncology

## 2019-08-16 DIAGNOSIS — C720 Malignant neoplasm of spinal cord: Secondary | ICD-10-CM | POA: Diagnosis not present

## 2019-08-17 ENCOUNTER — Ambulatory Visit
Admission: RE | Admit: 2019-08-17 | Discharge: 2019-08-17 | Disposition: A | Discharge status: Home / Self Care | Payer: BC Managed Care – PPO | Source: Ambulatory Visit | Attending: Radiation Oncology | Admitting: Radiation Oncology

## 2019-08-17 ENCOUNTER — Other Ambulatory Visit: Payer: Self-pay

## 2019-08-17 DIAGNOSIS — C720 Malignant neoplasm of spinal cord: Secondary | ICD-10-CM | POA: Diagnosis not present

## 2019-08-18 ENCOUNTER — Other Ambulatory Visit: Payer: Self-pay

## 2019-08-18 ENCOUNTER — Ambulatory Visit
Admission: RE | Admit: 2019-08-18 | Discharge: 2019-08-18 | Disposition: A | Discharge status: Home / Self Care | Payer: BC Managed Care – PPO | Source: Ambulatory Visit | Attending: Radiation Oncology | Admitting: Radiation Oncology

## 2019-08-18 ENCOUNTER — Encounter
Payer: BC Managed Care – PPO | Attending: Physical Medicine and Rehabilitation | Admitting: Physical Medicine and Rehabilitation

## 2019-08-18 ENCOUNTER — Encounter: Payer: Self-pay | Admitting: Physical Medicine and Rehabilitation

## 2019-08-18 VITALS — BP 111/73 | HR 73 | Temp 98.6°F | Ht 70.0 in | Wt 147.6 lb

## 2019-08-18 DIAGNOSIS — C720 Malignant neoplasm of spinal cord: Secondary | ICD-10-CM | POA: Diagnosis not present

## 2019-08-18 DIAGNOSIS — Z5181 Encounter for therapeutic drug level monitoring: Secondary | ICD-10-CM | POA: Insufficient documentation

## 2019-08-18 DIAGNOSIS — M792 Neuralgia and neuritis, unspecified: Secondary | ICD-10-CM | POA: Insufficient documentation

## 2019-08-18 DIAGNOSIS — G894 Chronic pain syndrome: Secondary | ICD-10-CM | POA: Insufficient documentation

## 2019-08-18 DIAGNOSIS — Z79891 Long term (current) use of opiate analgesic: Secondary | ICD-10-CM | POA: Diagnosis not present

## 2019-08-18 DIAGNOSIS — N529 Male erectile dysfunction, unspecified: Secondary | ICD-10-CM | POA: Diagnosis not present

## 2019-08-18 DIAGNOSIS — K592 Neurogenic bowel, not elsewhere classified: Secondary | ICD-10-CM | POA: Diagnosis not present

## 2019-08-18 DIAGNOSIS — G8250 Quadriplegia, unspecified: Secondary | ICD-10-CM | POA: Diagnosis not present

## 2019-08-18 DIAGNOSIS — R252 Cramp and spasm: Secondary | ICD-10-CM

## 2019-08-18 DIAGNOSIS — R519 Headache, unspecified: Secondary | ICD-10-CM | POA: Diagnosis not present

## 2019-08-18 MED ORDER — DANTROLENE SODIUM 50 MG PO CAPS: 50.0000 mg | ORAL_CAPSULE | Freq: Every day | ORAL | 5 refills | Status: DC

## 2019-08-18 MED ORDER — CITALOPRAM HYDROBROMIDE 40 MG PO TABS: 40.0000 mg | ORAL_TABLET | Freq: Every day | ORAL | 5 refills | Status: DC

## 2019-08-18 MED ORDER — GABAPENTIN 600 MG PO TABS
600.0000 mg | ORAL_TABLET | Freq: Three times a day (TID) | ORAL | 5 refills | Status: DC
Start: 2019-08-18 — End: 2020-03-06

## 2019-08-18 MED ORDER — BACLOFEN 5 MG PO TABS: 15.0000 mg | ORAL_TABLET | Freq: Four times a day (QID) | ORAL | 5 refills | Status: DC

## 2019-08-18 MED ORDER — SILDENAFIL CITRATE 100 MG PO TABS
100.0000 mg | ORAL_TABLET | Freq: Every day | ORAL | 5 refills | Status: DC | PRN
Start: 2019-08-18 — End: 2020-01-26

## 2019-08-18 NOTE — Progress Notes (Signed)
Subjective:    Patient ID: Jacob Lambert, adult    DOB: [birth year], 34 y.o.   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    Just finished week 2 of radiation.  Feeling pretty well- having some fatigue and irritation around incision somewhat.  Otherwise, minimal side effects.   They think swallowing/throat will be affected as of next week, so plan on treating Sx's as required.    Using rollator or RW most of time. Esp at home- can walks at max 1/4 of mile before cannot walk anymore-  When goes out of the home, mostly using the w/c.  Can stand and can get up from being bent down now- which is new.   Using shower chair to shower on their own.  Not good walking on not flat surfaces- gravel, sand, carpet, floor in bathroom sometimes, hills,   Stopped the Lovenox- and stopped the TEDs The biggest change- more light headed and dizzy when stands up or sits up from laying down. Giving self more time when getting up.     Pain- finger and wrists- feels like carpal tunnel symptoms- fingers will claw and lock in place.  Has to pull fingers back since tight- both hands.   Hands-wise- fingers are now able to spread 3rd/4th digits apart, with OT now, but feels stretch more.   More pain from incision on cervical spine.   Hasn't used Oxycodone since 4 days after hospital. Except 2 other times.  Has used Valium a couple of times.   Usually using tylenol, occ lidocaine; CBD cream- only thing that works for L 3rd digit nerve pain.   Doesn't want to take pain meds due to bowel meds.    Going to bathroom/ having bowel movements-  most days without a suppository- usually late morning- occ 1-2x/week needs to use suppository-  On toilet- only 2 accidents since d/c- was on the way those 2 times.    Has a lot of false starts- frequently has to go back 1  hour later to have BM. Still wearing depends daily, just in case.  Occ having leakage.   Uses a soft, foam neck brace occ at night to sleep.   Is having drowsiness with Baclofen somewhat.             Pain Inventory Average Pain 6 Pain Right Now 5 My pain is intermittent, constant, burning, tingling and aching  In the last 24 hours, has pain interfered with the following? General activity 4 Relation with others 3 Enjoyment of life 5 What TIME of day is your pain at its worst? morning  Sleep (in general) Fair  Pain is worse with: bending, inactivity, standing and some activites Pain improves with: heat/ice and medication Relief from Meds: 6  History reviewed. No pertinent family history. Social History   Socioeconomic History  . Marital status: Single    Spouse name: Not on file  . Number of children: Not on file  . Years of education: Not on file  . Highest education level: Not on file  Occupational History  . Not on file  Tobacco Use  . Smoking status: Former Research scientist (life sciences)  . Smokeless tobacco: Never Used  . Tobacco comment: none in years   Vaping Use  . Vaping Use: Never used  Substance and Sexual Activity  . Alcohol use: Not Currently  Comment: 2 bottles of wine per week, Maybe 4 - 8 shots aweek  . Drug use: Not Currently    Comment: CDB  . Sexual activity: Not Currently  Other Topics Concern  . Not on file  Social History Narrative   Lives with parents   Caffeine use: minimum- 3 cups per day (on average 6-10 cups per day)   Left handed    Social Determinants of Health   Financial Resource Strain:   . Difficulty of Paying Living Expenses:   Food Insecurity:   . Worried About Charity fundraiser in the Last Year:   . Arboriculturist in the Last Year:   Transportation Needs:   . Film/video editor (Medical):   Marland Kitchen Lack of Transportation (Non-Medical):   Physical Activity:   . Days of Exercise per Week:   . Minutes of Exercise per Session:     Stress:   . Feeling of Stress :   Social Connections:   . Frequency of Communication with Friends and Family:   . Frequency of Social Gatherings with Friends and Family:   . Attends Religious Services:   . Active Member of Clubs or Organizations:   . Attends Archivist Meetings:   Marland Kitchen Marital Status:    Past Surgical History:  Procedure Laterality Date  . LAMINECTOMY N/A 06/01/2019   Procedure: Cervical six-seven Laminoplasty, biopsy and resection of intramedullary spinal cord tumor;  Surgeon: [name], MD;  Location: [place];  Service: Neurosurgery;  Laterality: N/A;  . NO PAST SURGERIES     Past Surgical History:  Procedure Laterality Date  . LAMINECTOMY N/A 06/01/2019   Procedure: Cervical six-seven Laminoplasty, biopsy and resection of intramedullary spinal cord tumor;  Surgeon: [name], MD;  Location: [place];  Service: Neurosurgery;  Laterality: N/A;  . NO PAST SURGERIES     Past Medical History:  Diagnosis Date  . Anxiety   . Clonus   . Depression   . Headache   . Pneumonia    "walking"  . Quadriplegia and quadriparesis ([facility])   . Spasticity   . Spinal cord tumor   . Wheelchair bound    BP 111/73   Pulse 73   Temp 98.6 F (37 C)   Ht 5\' 10"  (1.778 m)   Wt 147 lb 9.6 oz (67 kg)   SpO2 97%   BMI 21.18 kg/m   Opioid Risk Score:   Fall Risk Score:  `1  Depression screen PHQ 2/9  No flowsheet data found. Review of Systems  Constitutional: Negative.   HENT: Negative.   Eyes: Negative.   Respiratory: Negative.   Cardiovascular: Negative.   Gastrointestinal: Positive for constipation.  Endocrine: Negative.   Genitourinary: Negative.   Musculoskeletal: Positive for back pain.       Right arm, wrist pain, finger pain, feet, left knee  Allergic/Immunologic: Negative.   Neurological: Negative.   Hematological: Negative.   Psychiatric/Behavioral: Negative.   All other systems reviewed and are negative.      Objective:    Physical Exam  Awake, alert, appropriate, sitting up on table, NAD MS: RUE- deltoid 5-/5, biceps 5-/5, tricep 4+/5, WE 4/5, grip 4+/5, finger abd 4+/5 LUE- deltoid 5-/5, biceps 5-/5, triceps 5-/5, WE 4+/5, grip 4+/5, finger abd 4-/5  RLE- HF 5-/5, KE 5/5, KF 5-/5, DF 4+/5, PF 5-/5 LLE- HF 5-/5, KE 5/5, KF 5/5, DF 5-/5, PF 5-/5  Neuro: MAS of 2 in L knee- otherwise MAS of  1 to 1+ in LEs and MAS of 1 overall in UEs- No clonus in wrists- but 5-6 beats of clonus on RLE and 3-4 beats LLE; Hoffman's notable on LUE, not RUE       Assessment & Plan:   Patient is a 34 yr old nonbinary patient who 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   1. Suggest, if uses suppository- move to morning after breakfast.   2. Will wait on oral drug screen- since doesn't need Rx for pain meds at this time. If needs, can get at Oncologist   3. Maintain baclofen dose right now due to drowsiness for now. 15 mg 4x/day- 5 RFs- send to Kristopher Oppen[facility] at [place] [facility].   4. Dantrolene- 50 mg  Nightly x 1 week then 50 mg 2x/day x 1 week, max 50 3x/day- for spasticity- take WITH baclofen, not instead of them.    5. Taking Miralax 2-3x/day and Colace 1 pill 3x/day; and Senokot at night.   6. Renew Gabapentin 600 mg TID- 5 RFs   7. Continue Celexa 40 mg daily- 5 RFs  8. Increase Viagra to 100 mg qday prn- #10- 5 RFs  9.  No poppers!!! Use lube- a lot of them.     10. [facility] [place] except Baclofen.   11. [facility] OT [facility]-ordered- to start after 9/14-    12. F/U in 6 weeks.  Needs CMP at f/u if doesn't get by Oncology  I spent a total of 60 minutes on visit- as detailed above.

## 2019-08-18 NOTE — Patient Instructions (Signed)
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   1. Suggest, if uses suppository- move to morning after breakfast.   2. Will wait on oral drug screen- since doesn't need Rx for pain meds at this time. If needs, can get at Oncologist   3. Maintain baclofen dose right now due to drowsiness for now. 15 mg 4x/day- 5 RFs- send to Kristopher Oppenheim at [facility].   4. Dantrolene- 50 mg  Nightly x 1 week then 50 mg 2x/day x 1 week, max 50 3x/day- for spasticity- take WITH baclofen, not instead of them.    5. Taking Miralax 2-3x/day and Colace 1 pill 3x/day; and Senokot at night.   6. Renew Gabapentin 600 mg TID- 5 RFs   7. Continue Celexa 40 mg daily- 5 RFs  8. Increase Viagra to 100 mg qday prn- #10- 5 RFs  9.  No poppers!!! Use lube- a lot of them.     10. [facility] [place] except [facility]ofen.   11. Outpatient PT and OT at [facility]-ordered- to start after 9/14-    12. F/U in 6 weeks.  Needs CMP at f/u if doesn't get by Oncology

## 2019-08-21 ENCOUNTER — Other Ambulatory Visit: Payer: Self-pay

## 2019-08-21 ENCOUNTER — Ambulatory Visit
Admission: RE | Admit: 2019-08-21 | Discharge: 2019-08-21 | Disposition: A | Discharge status: Home / Self Care | Payer: BC Managed Care – PPO | Source: Ambulatory Visit | Attending: Radiation Oncology | Admitting: Radiation Oncology

## 2019-08-21 DIAGNOSIS — C720 Malignant neoplasm of spinal cord: Secondary | ICD-10-CM | POA: Diagnosis not present

## 2019-08-22 ENCOUNTER — Other Ambulatory Visit: Payer: Self-pay

## 2019-08-22 ENCOUNTER — Ambulatory Visit
Admission: RE | Admit: 2019-08-22 | Discharge: 2019-08-22 | Disposition: A | Discharge status: Home / Self Care | Payer: BC Managed Care – PPO | Source: Ambulatory Visit | Attending: Radiation Oncology | Admitting: Radiation Oncology

## 2019-08-22 DIAGNOSIS — C720 Malignant neoplasm of spinal cord: Secondary | ICD-10-CM | POA: Diagnosis not present

## 2019-08-23 ENCOUNTER — Ambulatory Visit
Admission: RE | Admit: 2019-08-23 | Discharge: 2019-08-23 | Disposition: A | Discharge status: Home / Self Care | Payer: BC Managed Care – PPO | Source: Ambulatory Visit | Attending: Radiation Oncology | Admitting: Radiation Oncology

## 2019-08-23 ENCOUNTER — Other Ambulatory Visit: Payer: Self-pay

## 2019-08-23 DIAGNOSIS — C720 Malignant neoplasm of spinal cord: Secondary | ICD-10-CM | POA: Diagnosis not present

## 2019-08-24 ENCOUNTER — Other Ambulatory Visit: Payer: Self-pay

## 2019-08-24 ENCOUNTER — Ambulatory Visit
Admission: RE | Admit: 2019-08-24 | Discharge: 2019-08-24 | Disposition: A | Discharge status: Home / Self Care | Payer: BC Managed Care – PPO | Source: Ambulatory Visit | Attending: Radiation Oncology | Admitting: Radiation Oncology

## 2019-08-24 DIAGNOSIS — C720 Malignant neoplasm of spinal cord: Secondary | ICD-10-CM | POA: Diagnosis not present

## 2019-08-25 ENCOUNTER — Other Ambulatory Visit: Payer: Self-pay | Admitting: Radiation Oncology

## 2019-08-25 ENCOUNTER — Ambulatory Visit
Admission: RE | Admit: 2019-08-25 | Discharge: 2019-08-25 | Disposition: A | Discharge status: Home / Self Care | Payer: BC Managed Care – PPO | Source: Ambulatory Visit | Attending: Radiation Oncology | Admitting: Radiation Oncology

## 2019-08-25 ENCOUNTER — Encounter: Payer: Self-pay | Admitting: Family Medicine

## 2019-08-25 ENCOUNTER — Other Ambulatory Visit: Payer: Self-pay

## 2019-08-25 ENCOUNTER — Telehealth: Payer: Self-pay | Admitting: Radiation Oncology

## 2019-08-25 DIAGNOSIS — C720 Malignant neoplasm of spinal cord: Secondary | ICD-10-CM | POA: Diagnosis not present

## 2019-08-25 MED ORDER — SUCRALFATE 1 G PO TABS
1.0000 g | ORAL_TABLET | Freq: Three times a day (TID) | ORAL | 1 refills | Status: DC
Start: 2019-08-25 — End: 2019-11-08

## 2019-08-25 NOTE — Telephone Encounter (Signed)
Per Dr. Johny Shears order this RN called in [place] to [place] and spoke with [facility].

## 2019-08-28 ENCOUNTER — Ambulatory Visit
Admission: RE | Admit: 2019-08-28 | Discharge: 2019-08-28 | Disposition: A | Discharge status: Home / Self Care | Payer: BC Managed Care – PPO | Source: Ambulatory Visit | Attending: Radiation Oncology | Admitting: Radiation Oncology

## 2019-08-28 ENCOUNTER — Other Ambulatory Visit: Payer: Self-pay

## 2019-08-28 DIAGNOSIS — G8250 Quadriplegia, unspecified: Secondary | ICD-10-CM | POA: Diagnosis not present

## 2019-08-28 DIAGNOSIS — C720 Malignant neoplasm of spinal cord: Secondary | ICD-10-CM | POA: Diagnosis not present

## 2019-08-29 ENCOUNTER — Other Ambulatory Visit: Payer: Self-pay

## 2019-08-29 ENCOUNTER — Ambulatory Visit
Admission: RE | Admit: 2019-08-29 | Discharge: 2019-08-29 | Disposition: A | Discharge status: Home / Self Care | Payer: BC Managed Care – PPO | Source: Ambulatory Visit | Attending: Radiation Oncology | Admitting: Radiation Oncology

## 2019-08-29 DIAGNOSIS — C720 Malignant neoplasm of spinal cord: Secondary | ICD-10-CM | POA: Diagnosis not present

## 2019-08-30 ENCOUNTER — Ambulatory Visit
Admission: RE | Admit: 2019-08-30 | Discharge: 2019-08-30 | Disposition: A | Discharge status: Home / Self Care | Payer: BC Managed Care – PPO | Source: Ambulatory Visit | Attending: Radiation Oncology | Admitting: Radiation Oncology

## 2019-08-30 DIAGNOSIS — C720 Malignant neoplasm of spinal cord: Secondary | ICD-10-CM | POA: Diagnosis not present

## 2019-08-31 ENCOUNTER — Other Ambulatory Visit: Payer: Self-pay

## 2019-08-31 ENCOUNTER — Ambulatory Visit
Admission: RE | Admit: 2019-08-31 | Discharge: 2019-08-31 | Disposition: A | Discharge status: Home / Self Care | Payer: BC Managed Care – PPO | Source: Ambulatory Visit | Attending: Radiation Oncology | Admitting: Radiation Oncology

## 2019-08-31 DIAGNOSIS — C720 Malignant neoplasm of spinal cord: Secondary | ICD-10-CM | POA: Diagnosis not present

## 2019-09-01 ENCOUNTER — Ambulatory Visit
Admission: RE | Admit: 2019-09-01 | Discharge: 2019-09-01 | Disposition: A | Discharge status: Home / Self Care | Payer: BC Managed Care – PPO | Source: Ambulatory Visit | Attending: Radiation Oncology | Admitting: Radiation Oncology

## 2019-09-01 ENCOUNTER — Other Ambulatory Visit: Payer: Self-pay

## 2019-09-01 DIAGNOSIS — C720 Malignant neoplasm of spinal cord: Secondary | ICD-10-CM | POA: Diagnosis not present

## 2019-09-04 ENCOUNTER — Other Ambulatory Visit: Payer: Self-pay

## 2019-09-04 ENCOUNTER — Ambulatory Visit
Admission: RE | Admit: 2019-09-04 | Discharge: 2019-09-04 | Disposition: A | Discharge status: Home / Self Care | Payer: BC Managed Care – PPO | Source: Ambulatory Visit | Attending: Radiation Oncology | Admitting: Radiation Oncology

## 2019-09-04 DIAGNOSIS — C720 Malignant neoplasm of spinal cord: Secondary | ICD-10-CM | POA: Diagnosis not present

## 2019-09-05 ENCOUNTER — Other Ambulatory Visit: Payer: Self-pay

## 2019-09-05 ENCOUNTER — Ambulatory Visit
Admission: RE | Admit: 2019-09-05 | Discharge: 2019-09-05 | Disposition: A | Discharge status: Home / Self Care | Payer: BC Managed Care – PPO | Source: Ambulatory Visit | Attending: Radiation Oncology | Admitting: Radiation Oncology

## 2019-09-05 DIAGNOSIS — C720 Malignant neoplasm of spinal cord: Secondary | ICD-10-CM | POA: Diagnosis not present

## 2019-09-06 ENCOUNTER — Ambulatory Visit
Admission: RE | Admit: 2019-09-06 | Discharge: 2019-09-06 | Disposition: A | Discharge status: Home / Self Care | Payer: BC Managed Care – PPO | Source: Ambulatory Visit | Attending: Radiation Oncology | Admitting: Radiation Oncology

## 2019-09-06 DIAGNOSIS — C720 Malignant neoplasm of spinal cord: Secondary | ICD-10-CM | POA: Insufficient documentation

## 2019-09-07 ENCOUNTER — Ambulatory Visit
Admission: RE | Admit: 2019-09-07 | Discharge: 2019-09-07 | Disposition: A | Discharge status: Home / Self Care | Payer: BC Managed Care – PPO | Source: Ambulatory Visit | Attending: Radiation Oncology | Admitting: Radiation Oncology

## 2019-09-07 ENCOUNTER — Other Ambulatory Visit: Payer: Self-pay

## 2019-09-07 DIAGNOSIS — C720 Malignant neoplasm of spinal cord: Secondary | ICD-10-CM | POA: Diagnosis not present

## 2019-09-08 ENCOUNTER — Ambulatory Visit
Admission: RE | Admit: 2019-09-08 | Discharge: 2019-09-08 | Disposition: A | Discharge status: Home / Self Care | Payer: BC Managed Care – PPO | Source: Ambulatory Visit | Attending: Radiation Oncology | Admitting: Radiation Oncology

## 2019-09-08 ENCOUNTER — Other Ambulatory Visit: Payer: Self-pay | Admitting: Radiation Oncology

## 2019-09-08 ENCOUNTER — Other Ambulatory Visit: Payer: Self-pay

## 2019-09-08 DIAGNOSIS — C720 Malignant neoplasm of spinal cord: Secondary | ICD-10-CM | POA: Diagnosis not present

## 2019-09-08 MED ORDER — LIDOCAINE VISCOUS HCL 2 % MT SOLN
5.0000 mL | OROMUCOSAL | 5 refills | Status: DC | PRN
Start: 2019-09-08 — End: 2019-11-08

## 2019-09-12 ENCOUNTER — Ambulatory Visit
Admission: RE | Admit: 2019-09-12 | Discharge: 2019-09-12 | Disposition: A | Discharge status: Home / Self Care | Payer: BC Managed Care – PPO | Source: Ambulatory Visit | Attending: Radiation Oncology | Admitting: Radiation Oncology

## 2019-09-12 ENCOUNTER — Other Ambulatory Visit: Payer: Self-pay

## 2019-09-12 DIAGNOSIS — C720 Malignant neoplasm of spinal cord: Secondary | ICD-10-CM | POA: Diagnosis not present

## 2019-09-13 ENCOUNTER — Ambulatory Visit
Admission: RE | Admit: 2019-09-13 | Discharge: 2019-09-13 | Disposition: A | Discharge status: Home / Self Care | Payer: BC Managed Care – PPO | Source: Ambulatory Visit | Attending: Radiation Oncology | Admitting: Radiation Oncology

## 2019-09-13 ENCOUNTER — Other Ambulatory Visit: Payer: Self-pay

## 2019-09-13 DIAGNOSIS — C720 Malignant neoplasm of spinal cord: Secondary | ICD-10-CM | POA: Diagnosis not present

## 2019-09-14 ENCOUNTER — Other Ambulatory Visit: Payer: Self-pay

## 2019-09-14 ENCOUNTER — Ambulatory Visit
Admission: RE | Admit: 2019-09-14 | Discharge: 2019-09-14 | Disposition: A | Discharge status: Home / Self Care | Payer: BC Managed Care – PPO | Source: Ambulatory Visit | Attending: Radiation Oncology | Admitting: Radiation Oncology

## 2019-09-14 DIAGNOSIS — C720 Malignant neoplasm of spinal cord: Secondary | ICD-10-CM | POA: Diagnosis not present

## 2019-09-15 ENCOUNTER — Ambulatory Visit
Admission: RE | Admit: 2019-09-15 | Discharge: 2019-09-15 | Disposition: A | Discharge status: Home / Self Care | Payer: BC Managed Care – PPO | Source: Ambulatory Visit | Attending: Radiation Oncology | Admitting: Radiation Oncology

## 2019-09-15 DIAGNOSIS — C720 Malignant neoplasm of spinal cord: Secondary | ICD-10-CM | POA: Diagnosis not present

## 2019-09-18 ENCOUNTER — Other Ambulatory Visit: Payer: Self-pay

## 2019-09-18 ENCOUNTER — Ambulatory Visit
Admission: RE | Admit: 2019-09-18 | Discharge: 2019-09-18 | Disposition: A | Discharge status: Home / Self Care | Payer: BC Managed Care – PPO | Source: Ambulatory Visit | Attending: Radiation Oncology | Admitting: Radiation Oncology

## 2019-09-18 DIAGNOSIS — C720 Malignant neoplasm of spinal cord: Secondary | ICD-10-CM | POA: Diagnosis not present

## 2019-09-19 ENCOUNTER — Encounter: Payer: Self-pay | Admitting: Urology

## 2019-09-19 ENCOUNTER — Other Ambulatory Visit: Payer: Self-pay

## 2019-09-19 ENCOUNTER — Ambulatory Visit
Admission: RE | Admit: 2019-09-19 | Discharge: 2019-09-19 | Disposition: A | Discharge status: Home / Self Care | Payer: BC Managed Care – PPO | Source: Ambulatory Visit | Attending: Radiation Oncology | Admitting: Radiation Oncology

## 2019-09-19 DIAGNOSIS — C720 Malignant neoplasm of spinal cord: Secondary | ICD-10-CM | POA: Diagnosis not present

## 2019-09-20 ENCOUNTER — Ambulatory Visit: Payer: BC Managed Care – PPO | Attending: Physical Medicine and Rehabilitation

## 2019-09-20 ENCOUNTER — Other Ambulatory Visit: Payer: Self-pay | Admitting: Radiation Therapy

## 2019-09-20 DIAGNOSIS — D497 Neoplasm of unspecified behavior of endocrine glands and other parts of nervous system: Secondary | ICD-10-CM

## 2019-09-20 DIAGNOSIS — R52 Pain, unspecified: Secondary | ICD-10-CM | POA: Insufficient documentation

## 2019-09-20 DIAGNOSIS — M6281 Muscle weakness (generalized): Secondary | ICD-10-CM | POA: Diagnosis not present

## 2019-09-20 DIAGNOSIS — R2689 Other abnormalities of gait and mobility: Secondary | ICD-10-CM

## 2019-09-20 DIAGNOSIS — R2681 Unsteadiness on feet: Secondary | ICD-10-CM | POA: Diagnosis not present

## 2019-09-20 NOTE — Therapy (Signed)
[facility] [address] [place], [place], [address] Phone: [phone]   Fax:  [phone]  Physical Therapy Evaluation  Patient Details  Name: Jacob Lambert MRN: [id number] Date of Birth: 04-19-1985 Referring Provider (PT): Dr. Courtney Heys   Encounter Date: 09/20/2019   PT End of Session - 09/20/19 1845    Visit Number 1    Number of Visits 17    Date for PT Re-Evaluation 11/15/19    PT Start Time 3557    PT Stop Time 1230    PT Time Calculation (min) 45 min    Equipment Utilized During Treatment Gait belt    Activity Tolerance Patient tolerated treatment well    Behavior During Therapy [facility] for tasks assessed/performed           Past Medical History:  Diagnosis Date  . Anxiety   . Clonus   . Depression   . Headache   . Pneumonia    "walking"  . Quadriplegia and quadriparesis ([place])   . Spasticity   . Spinal cord tumor   . Wheelchair bound     Past Surgical History:  Procedure Laterality Date  . LAMINECTOMY N/A 06/01/2019   Procedure: Cervical six-seven Laminoplasty, biopsy and resection of intramedullary spinal cord tumor;  Surgeon: [name], MD;  Location: [place];  Service: Neurosurgery;  Laterality: N/A;  . NO PAST SURGERIES      There were no vitals filed for this visit.    Subjective Assessment - 09/20/19 1256    Subjective Initially Mid February, started with weakness in legs and pain in bil feet. Had few falls in March. Also fell in the shower. Early April, couldn't move the legs. Moved from [place] to [place] with to live with parents while being evaluated. Pt also had tremors in the legs and noted difficulty with walking, balance and transfers. Weakness progressed to upper extremities as well. On 06/01/19, pt had Cervical spinal cord tumor resection and c6-C7 laminoplasty performed by Dr. Emelda Brothers. Pt was in inpatient and rehab from 06/01/19 to 06/28/19 at home with  parents. pt had home PT and OT until start of Chemo (early August). Pt started radiation beginning of August and finished 6 wks of radiations 09/19/19. Pt reports of constant pain in middle finger in left hand, intermittent pain in bil hands and wrists with intermittent activities. Numbness and loss sensation in digits 4-5 in L hand. Pt reports of constant numbness/tingling in left foot. Starting to have intermittent numbness/tingling in R foot. Stiffness in R knee>L knee. Pt is also legally blind in R eye.    Limitations Standing;Walking;House hold activities    How long can you sit comfortably? no issues    How long can you stand comfortably? <5 min    How long can you walk comfortably? <0.25 mile    Patient Stated Goals Progress walking    Currently in Pain? Yes    Pain Location Leg    Pain Orientation Right;Left    Pain Descriptors / Indicators Burning;Radiating;Pins and needles;Numbness    Pain Type Neuropathic pain    Pain Onset More than a month ago    Pain Frequency Constant              [facility] PT Assessment - 09/20/19 1300      Assessment   Medical Diagnosis Weakness (cervical spinal cord tumor resection/post radiation)    Referring Provider (PT) Dr. Courtney Heys    Onset Date/Surgical Date 06/01/19  Hand Dominance Left    Prior Therapy [facility], home health      Precautions   Precautions Fall      Restrictions   Weight Bearing Restrictions No      Balance Screen   Has the patient fallen in the past 6 months Yes    How many times? >5 (2-3x after surgery at home)    Has the patient had a decrease in activity level because of a fear of falling?  Yes    Is the patient reluctant to leave their home because of a fear of falling?  No      Home Social worker Private residence    Living Arrangements Parent    Available Help at Discharge Family    Type of [place] to enter    Entrance Stairs-Number of Steps 2    Entrance  Stairs-Rails Right    Home Layout Two level;Able to live on main level with bedroom/bathroom    [place] - 2 wheels;Walker - 4 wheels;Bedside commode;Shower seat;Wheelchair - manual      Prior Function   Level of Independence Requires assistive device for independence;Needs assistance with homemaking;Needs assistance with ADLs;Independent with household mobility with device      Cognition   Overall Cognitive Status Within Functional Limits for tasks assessed      ROM / Strength   AROM / PROM / Strength Strength      Strength   Strength Assessment Site Knee;Hip;Ankle    Right/Left Hip Right;Left    Right Hip Flexion 4+/5    Left Hip Flexion 3+/5    Right/Left Knee Right;Left    Right Knee Flexion 3+/5    Right Knee Extension 4/5    Left Knee Flexion 3+/5    Left Knee Extension 4-/5    Right/Left Ankle Right;Left    Right Ankle Dorsiflexion 4/5    Left Ankle Dorsiflexion 4/5      Standardized Balance Assessment   Standardized Balance Assessment Five Times Sit to Stand;Timed Up and Go Test;Berg Balance Test    Five times sit to stand comments  13      Berg Balance Test   Sit to Stand Able to stand without using hands and stabilize independently    Standing Unsupported Able to stand safely 2 minutes    Sitting with Back Unsupported but Feet Supported on Floor or Stool Able to sit safely and securely 2 minutes    Stand to Sit Sits safely with minimal use of hands    Transfers Able to transfer safely, minor use of hands    Standing Unsupported with Eyes Closed Able to stand 10 seconds safely    Standing Unsupported with Feet Together Able to place feet together independently and stand 1 minute safely    From Standing, Reach Forward with Outstretched Arm Can reach forward >12 cm safely (5")    From Standing Position, Pick up Object from Floor Unable to try/needs assist to keep balance    From Standing Position, Turn to Look Behind Over each Shoulder Looks behind from  both sides and weight shifts well    Turn 360 Degrees Able to turn 360 degrees safely but slowly    Standing Unsupported, Alternately Place Feet on Step/Stool Able to stand independently and complete 8 steps >20 seconds    Standing Unsupported, One Foot in [employer] balance while stepping or standing    Standing on One Leg Unable  to try or needs assist to prevent fall    Total Score 40      Timed Up and Go Test   Normal TUG (seconds) 14   RW                     Objective measurements completed on examination: See above findings.                 PT Short Term Goals - 09/20/19 1837      PT SHORT TERM GOAL #1   Title Patient will be able to ambulate 115' with cane to improve short distance ambulation within home    Baseline RW only    Time 4    Period Weeks    Status New    Target Date 10/18/19      PT SHORT TERM GOAL #2   Title Pt will demo <12 seconds on timed up and go with RW to improve functional mobility    Baseline 14 seconds RW (eval)    Time 4    Period Weeks    Status New    Target Date 10/18/19      PT SHORT TERM GOAL #3   Title Patient will be able to go up and down a ramp and curb step to be able to negotiate community    Baseline not attempted    Time 4    Period Weeks    Status New    Target Date 10/18/19             PT Long Term Goals - 09/20/19 1839      PT LONG TERM GOAL #1   Title Patient will demo >50/56 on berg balance scale to improve functional balance and reduce fall risk    Baseline 40/56 (eval)    Time 8    Period Weeks    Status New    Target Date 11/15/19      PT LONG TERM GOAL #2   Title Patient will be able to ambulate >500 feet with cane to improve short distance community ambulation.    Baseline only walks with RW    Time 8    Period Weeks    Status New    Target Date 11/15/19      PT LONG TERM GOAL #3   Title Patient will demo 4+/5 strength in bil LE grossly to improve functional strength with  transfers    Baseline 3+-4/5 grossly in bil LE    Time 8    Period Weeks    Status New    Target Date 11/15/19      PT LONG TERM GOAL #4   Title Patient will be able to go up and down 12 steps with use of 1-2 rails to be able to go up and down steps at home    Baseline 2 steps (with one rail) (Eval)    Time 8    Period Weeks    Status New    Target Date 11/15/19      PT LONG TERM GOAL #5   Title 6MWT goal    Baseline TBD    Time 8    Period Weeks    Status New    Target Date 11/15/19                  Plan - 09/20/19 1841    Clinical Impression Statement Patient is a 34 y.o. male who was seen today for  physical therapy evaluation and treatment for generalized weakness and balance disorder due to cervical spinal cord tumor resection and post radiation. Patient demonstrates significant weakness in bil LE, decreased balance and proprioception, decreased endurance, decreased coordination, tremors, and pain. Patient will benefit from skilled PT to improve overall functional strength, balance, gait and overall function.    Personal Factors and Comorbidities Age;Past/Current Experience;Comorbidity 2;Time since onset of injury/illness/exacerbation;Transportation    Examination-Activity Limitations Bathing;Lift;Squat;Stairs;Stand;Transfers    Examination-Participation Restrictions Cleaning;Community Activity;Driving;Laundry;Occupation;Shop;Yard Work    Merchant navy officer Evolving/Moderate complexity    Clinical Decision Making Moderate    Rehab Potential Fair    PT Frequency 2x / week    PT Duration 8 weeks    PT Treatment/Interventions ADLs/Self Care Home Management;Gait training;Stair training;Functional mobility training;Therapeutic activities;Therapeutic exercise;Balance training;Neuromuscular re-education;Manual techniques;Wheelchair mobility training;Patient/family education;Passive range of motion;Energy conservation;Vestibular;Visual/perceptual  remediation/compensation;Joint Manipulations    PT Next Visit Plan Issue HEP, perform 6 MWT and address 6 MWT goal    Consulted and Agree with Plan of Care Patient           Patient will benefit from skilled therapeutic intervention in order to improve the following deficits and impairments:  Abnormal gait, Decreased activity tolerance, Decreased balance, Decreased cognition, Decreased mobility, Decreased endurance, Decreased coordination, Decreased range of motion, Decreased strength, Dizziness, Difficulty walking, Impaired sensation, Impaired tone, Impaired UE functional use, Postural dysfunction, Pain  Visit Diagnosis: Spinal cord tumor  Muscle weakness (generalized)  Other abnormalities of gait and mobility  Unsteadiness on feet     Problem List Patient Active Problem List   Diagnosis Date Noted  . Spasticity 08/18/2019  . Chronic pain syndrome 08/18/2019  . Nerve pain 08/18/2019  . Clonus 07/21/2019  . Spinal cord tumor   . Pain   . Transaminitis   . Leukocytosis   . Neurogenic bowel   . Postoperative pain   . Ependymoma ([place]) 06/08/2019  . Acute incomplete quadriplegia ([facility])   . Spinal cord ependymoma ([place]) 05/23/2019  . Numbness 05/10/2019  . Weakness of left lower extremity 05/10/2019  . 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 Visual disturbance 05/10/2019    Kerrie Pleasure, PT 09/20/2019, 6:46 PM  [facility] [address] [place], [place], [address] Phone: [phone]   Fax:  [phone]  Name: Jacob Lambert MRN: [id number] Date of Birth: [birth year]

## 2019-09-28 DIAGNOSIS — G8250 Quadriplegia, unspecified: Secondary | ICD-10-CM | POA: Diagnosis not present

## 2019-09-29 ENCOUNTER — Other Ambulatory Visit: Payer: Self-pay

## 2019-09-29 ENCOUNTER — Encounter: Payer: Self-pay | Admitting: Physical Therapy

## 2019-09-29 ENCOUNTER — Ambulatory Visit: Payer: BC Managed Care – PPO | Admitting: Physical Therapy

## 2019-09-29 DIAGNOSIS — M6281 Muscle weakness (generalized): Secondary | ICD-10-CM

## 2019-09-29 DIAGNOSIS — R2681 Unsteadiness on feet: Secondary | ICD-10-CM

## 2019-09-29 DIAGNOSIS — R52 Pain, unspecified: Secondary | ICD-10-CM | POA: Diagnosis not present

## 2019-09-29 DIAGNOSIS — R2689 Other abnormalities of gait and mobility: Secondary | ICD-10-CM | POA: Diagnosis not present

## 2019-09-29 DIAGNOSIS — D497 Neoplasm of unspecified behavior of endocrine glands and other parts of nervous system: Secondary | ICD-10-CM

## 2019-09-29 NOTE — Therapy (Signed)
[facility] [address] [place], [place], [address] Phone: [phone]   Fax:  [phone]  Physical Therapy Treatment  Patient Details  Name: Jacob Lambert MRN: [id number] Date of Birth: [birth year] Referring Provider (PT): Dr. Courtney Heys   Encounter Date: 09/29/2019   PT End of Session - 09/29/19 0856    Visit Number 2    Number of Visits 17    Date for PT Re-Evaluation 11/15/19    PT Start Time 0848    PT Stop Time 0930    PT Time Calculation (min) 42 min    Equipment Utilized During Treatment Gait belt    Activity Tolerance Patient tolerated treatment well    Behavior During Therapy [facility] for tasks assessed/performed           Past Medical History:  Diagnosis Date  . Anxiety   . Clonus   . Depression   . Headache   . Pneumonia    "walking"  . Quadriplegia and quadriparesis ([place])   . Spasticity   . Spinal cord tumor   . Wheelchair bound     Past Surgical History:  Procedure Laterality Date  . LAMINECTOMY N/A 06/01/2019   Procedure: Cervical six-seven Laminoplasty, biopsy and resection of intramedullary spinal cord tumor;  Surgeon: [name], MD;  Location: [facility];  Service: Neurosurgery;  Laterality: N/A;  . NO PAST SURGERIES      There were no vitals filed for this visit.   Subjective Assessment - 09/29/19 0852    Subjective Had a fall on Monday. Was walking out of bathroom with rollator when knees gave way with pt landing on knees. Used rollator to get self back up. Denies any injuries except for bruising on on knees. Now a yellowish color. No change in pain.    Limitations Standing;Walking;House hold activities    How long can you sit comfortably? no issues    How long can you stand comfortably? <5 min    How long can you walk comfortably? <0.25 mile    Patient Stated Goals Progress walking    Currently in Pain? Yes    Pain Score 4     Pain Location Generalized   bil LE's, left  hand/fingers   Pain Orientation Right;Left    Pain Descriptors / Indicators Burning;Radiating;Pins and needles;Numbness    Pain Type Neuropathic pain    Pain Onset More than a month ago    Pain Frequency Constant    Aggravating Factors  unknown    Pain Relieving Factors pain meds              [facility] PT Assessment - 09/29/19 0859      6 Minute Walk- Baseline   6 Minute Walk- Baseline yes    BP (mmHg) 112/81    HR (bpm) 64    02 Sat (%RA) 99 %    Modified Borg Scale for Dyspnea 0- Nothing at all    Perceived Rate of Exertion (Borg) 6-      6 Minute walk- Post Test   6 Minute Walk Post Test yes    BP (mmHg) 108/76    HR (bpm) 58    02 Sat (%RA) 100 %    Modified Borg Scale for Dyspnea 2- Mild shortness of breath    Perceived Rate of Exertion (Borg) 12-      6 minute walk test results    Aerobic Endurance Distance Walked 808    Endurance additional comments with  rollator with no rest breaks. right knee buckling toward end (~last 2 minutes)                [facility] Adult PT Treatment/Exercise - 09/29/19 0914      Transfers   Transfers Sit to Stand;Stand to Sit    Sit to Stand 5: Supervision;With upper extremity assist;From bed;From chair/3-in-1    Stand to Sit 5: Supervision;With upper extremity assist;To bed;To chair/3-in-1      Ambulation/Gait   Ambulation/Gait Yes    Ambulation/Gait Assistance 5: Supervision    Ambulation/Gait Assistance Details around gym with session.     Assistive device Rollator    Gait Pattern Step-through pattern;Decreased stride length;Scissoring;Decreased trunk rotation;Narrow base of support    Ambulation Surface Level;Indoor      Exercises   Exercises Other Exercises    Other Exercises  issued HEP to address strengthening and balance. Refer to [place] for full details. Cues on correct ex form and technique.Jeneen Rinks to HEP today: Access Code: [email or web address] URL: [email or web address] Date: 09/29/2019 Prepared  by: Willow Ora  Exercises Sit to Stand with Arms Crossed - 1 x daily - 5 x weekly - 1 sets - 10 reps Heel rises with counter support - 1 x daily - 5 x weekly - 1 sets - 10 reps Standing Balance in Corner with Eyes Closed - 1 x daily - 5 x weekly - 1 sets - 3 reps - 30 hold        PT Education - 09/29/19 1606    Education Details results of 6 minute walk test; initial HEP for strengthening and balance    Person(s) Educated Patient    Methods Explanation;Demonstration;Verbal cues;Handout    Comprehension Verbalized understanding;Returned demonstration;Verbal cues required;Need further instruction            PT Short Term Goals - 09/20/19 1837      PT SHORT TERM GOAL #1   Title Patient will be able to ambulate 115' with cane to improve short distance ambulation within home    Baseline RW only    Time 4    Period Weeks    Status New    Target Date 10/18/19      PT SHORT TERM GOAL #2   Title Pt will demo <12 seconds on timed up and go with RW to improve functional mobility    Baseline 14 seconds RW (eval)    Time 4    Period Weeks    Status New    Target Date 10/18/19      PT SHORT TERM GOAL #3   Title Patient will be able to go up and down a ramp and curb step to be able to negotiate community    Baseline not attempted    Time 4    Period Weeks    Status New    Target Date 10/18/19             PT Long Term Goals - 09/20/19 1839      PT LONG TERM GOAL #1   Title Patient will demo >50/56 on berg balance scale to improve functional balance and reduce fall risk    Baseline 40/56 (eval)    Time 8    Period Weeks    Status New    Target Date 11/15/19      PT LONG TERM GOAL #2   Title Patient will be able to ambulate >500 feet with cane to  improve short distance community ambulation.    Baseline only walks with RW    Time 8    Period Weeks    Status New    Target Date 11/15/19      PT LONG TERM GOAL #3   Title Patient will demo 4+/5 strength in bil LE  grossly to improve functional strength with transfers    Baseline 3+-4/5 grossly in bil LE    Time 8    Period Weeks    Status New    Target Date 11/15/19      PT LONG TERM GOAL #4   Title Patient will be able to go up and down 12 steps with use of 1-2 rails to be able to go up and down steps at home    Baseline 2 steps (with one rail) (Eval)    Time 8    Period Weeks    Status New    Target Date 11/15/19      PT LONG TERM GOAL #5   Title 6MWT goal    Baseline TBD    Time 8    Period Weeks    Status New    Target Date 11/15/19                 Plan - 09/29/19 0856    Clinical Impression Statement Today's skilled session focused on establishing a baseline distance for the 6 minute walk test with the primary PT to adjust goals as indicated. Remainder of session focused on establishement of an HEP to address strengthening and activity tolerance. No issues other that fatigue reported with rest breaks taken as needed. The pt is progressing toward goals and should benefit from continued PT to progress toward unmet goals.    Personal Factors and Comorbidities Age;Past/Current Experience;Comorbidity 2;Time since onset of injury/illness/exacerbation;Transportation    Examination-Activity Limitations Bathing;Lift;Squat;Stairs;Stand;Transfers    Examination-Participation Restrictions Cleaning;Community Activity;Driving;Laundry;Occupation;Shop;Yard Work    Product manager    Rehab Potential Fair    PT Frequency 2x / week    PT Duration 8 weeks    PT Treatment/Interventions ADLs/Self Care Home Management;Gait training;Stair training;Functional mobility training;Therapeutic activities;Therapeutic exercise;Balance training;Neuromuscular re-education;Manual techniques;Wheelchair mobility training;Patient/family education;Passive range of motion;Energy conservation;Vestibular;Visual/perceptual remediation/compensation;Joint Manipulations    PT  Next Visit Plan continue to work on LE strengthening, balance on solid surfaces progressing to compliant surfaces with decreased UE support. add to HEP as indicated    PT Home Exercise Plan Access Code: [email or web address]    Consulted and Agree with Plan of Care Patient           Patient will benefit from skilled therapeutic intervention in order to improve the following deficits and impairments:  Abnormal gait, Decreased activity tolerance, Decreased balance, Decreased cognition, Decreased mobility, Decreased endurance, Decreased coordination, Decreased range of motion, Decreased strength, Dizziness, Difficulty walking, Impaired sensation, Impaired tone, Impaired UE functional use, Postural dysfunction, Pain  Visit Diagnosis: Spinal cord tumor  Muscle weakness (generalized)  Other abnormalities of gait and mobility  Unsteadiness on feet     Problem List Patient Active Problem List   Diagnosis Date Noted  . Spasticity 08/18/2019  . Chronic pain syndrome 08/18/2019  . Nerve pain 08/18/2019  . Clonus 07/21/2019  . Spinal cord tumor   . Pain   . Transaminitis   . Leukocytosis   . Neurogenic bowel   . Postoperative pain   . 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 Acute incomplete quadriplegia ([facility])   . 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  Numbness 05/10/2019  . Weakness of left lower extremity 05/10/2019  . 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 Visual disturbance 05/10/2019    Willow Ora, PTA, [facility] [address], [place], [place] [address] [phone] 09/29/19, 4:08 PM   Name: Jacob Lambert MRN: [id number] Date of Birth: August 01, 1985

## 2019-09-29 NOTE — Patient Instructions (Addendum)
WALKING  Walking is a great form of exercise to increase your strength, endurance and overall fitness.  A walking program can help you start slowly and gradually build endurance as you go.  Everyone's ability is different, so each person's starting point will be different.  You do not have to follow them exactly.  The are just samples. You should simply find out what's right for you and stick to that program.   In the beginning, you'll start off walking 2-3 times a day for short distances.  As you get stronger, you'll be walking further at just 1-2 times per day.  A. You Can Walk For A Certain Length Of Time Each Day    Walk 6 minutes 1-2 times per day.  Increase 1-2 minutes every 6-7 days.  Work up to 25-30 minutes (1-2 times per day).    Issued to HEP:  Access Code: [email or web address] URL: [email or web address] Date: 09/29/2019 Prepared by: Willow Ora  Exercises Sit to Stand with Arms Crossed - 1 x daily - 5 x weekly - 1 sets - 10 reps Heel rises with counter support - 1 x daily - 5 x weekly - 1 sets - 10 reps Standing Balance in Corner with Eyes Closed - 1 x daily - 5 x weekly - 1 sets - 3 reps - 30 hold

## 2019-10-02 ENCOUNTER — Encounter: Payer: Self-pay | Admitting: Physical Medicine and Rehabilitation

## 2019-10-02 ENCOUNTER — Encounter
Payer: BC Managed Care – PPO | Attending: Physical Medicine and Rehabilitation | Admitting: Physical Medicine and Rehabilitation

## 2019-10-02 ENCOUNTER — Other Ambulatory Visit: Payer: Self-pay

## 2019-10-02 VITALS — BP 109/73 | HR 92 | Temp 98.4°F | Ht 70.0 in | Wt 146.6 lb

## 2019-10-02 DIAGNOSIS — Z5181 Encounter for therapeutic drug level monitoring: Secondary | ICD-10-CM | POA: Diagnosis not present

## 2019-10-02 DIAGNOSIS — R519 Headache, unspecified: Secondary | ICD-10-CM | POA: Diagnosis not present

## 2019-10-02 DIAGNOSIS — Z79891 Long term (current) use of opiate analgesic: Secondary | ICD-10-CM | POA: Diagnosis not present

## 2019-10-02 DIAGNOSIS — K592 Neurogenic bowel, not elsewhere classified: Secondary | ICD-10-CM | POA: Diagnosis not present

## 2019-10-02 DIAGNOSIS — N529 Male erectile dysfunction, unspecified: Secondary | ICD-10-CM | POA: Diagnosis not present

## 2019-10-02 DIAGNOSIS — C720 Malignant neoplasm of spinal cord: Secondary | ICD-10-CM | POA: Insufficient documentation

## 2019-10-02 DIAGNOSIS — G8250 Quadriplegia, unspecified: Secondary | ICD-10-CM | POA: Diagnosis not present

## 2019-10-02 DIAGNOSIS — G894 Chronic pain syndrome: Secondary | ICD-10-CM | POA: Diagnosis not present

## 2019-10-02 DIAGNOSIS — R252 Cramp and spasm: Secondary | ICD-10-CM | POA: Insufficient documentation

## 2019-10-02 DIAGNOSIS — R258 Other abnormal involuntary movements: Secondary | ICD-10-CM

## 2019-10-02 MED ORDER — DANTROLENE SODIUM 100 MG PO CAPS: 100.0000 mg | ORAL_CAPSULE | Freq: Two times a day (BID) | ORAL | 5 refills | Status: DC

## 2019-10-02 NOTE — Progress Notes (Signed)
Subjective:    Patient ID: Jacob Lambert, adult    DOB: [birth year], 34 y.o.   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   Finished Radiation 2 weeks ago.  Swelling in throat began to subside.   Cutting up solid foods/chopped ( was on all liquid diet). Limited on hot/spicy foods.    Got vaccine in June- so asking about booster  Is working daily, But taking naps and needing more time for fatigue.   Started Outpatient PT- 2x/week x 8 weeks; getting assessed for OT next Week.   Dantrolene- done wonders for clonus.  Not the swelling of L knee (explained won't help swelling of L knee).   Pain is more lately on R side.  Pain in R foot, R ankle; below knee is more prevalent.   More pain from incision site- R side more than L impacted.  Couldn't do lidocaine patches during radiation- can now.   Largely avoided pain meds during radiation.  Looking at CBD oils and oral droplets- with THC.   Can ejaculate now, but horrible shooting pain down LLE and occ on RLE.  Within 4 minutes mostly done; and 15 minutes, back to normal.   Couldn't climax with full stomach- of note.  Is 1 hour (+) gap from meds and ejaculation.  Less spasms after orgasms.   Getting better doing suppositories. With radiation- and getting back to baseline .  Struggling with light headedness and legs giving ways- legs woozy and jiggling "out" and won't keep upright when stands upright.  Is frequent-  Current BP 109/73- sitting.   Doesn't like anything but cement/flat surfaces for walking- esp with rollator. Vibration really bothers them and going up hills is difficult /endurance.  Leg also starts to  'Vibrate' with prolonged walking- sounds like dynamic spasticity.   Hands clawing less, but more intense when occurs.   Also getting PT- thinking about getting pool/aquatic  therapy ~ 2-4x/month.   Still needs to figure out what to do about R eye and poor vision.                  Pain Inventory Average Pain 5 Pain Right Now 4 My pain is constant, sharp, burning, dull, stabbing, tingling and aching  In the last 24 hours, has pain interfered with the following? General activity 5 Relation with others 7 Enjoyment of life 5 What TIME of day is your pain at its worst? morning  Sleep (in general) Fair  Pain is worse with: bending, inactivity, standing and some activites Pain improves with: medication Relief from Meds: not answered  History reviewed. No pertinent family history. Social History   Socioeconomic History  . Marital status: Single    Spouse name: Not on file  . Number of children: Not on file  . Years of education: Not on file  . Highest education level: Not on file  Occupational History  . Not on file  Tobacco Use  . Smoking status: Former Research scientist (life sciences)  . Smokeless tobacco: Never Used  . Tobacco comment: none in years   Vaping Use  . Vaping Use: Never used  Substance and Sexual Activity  . Alcohol use: Not Currently    Comment: 2 bottles of wine per week, Maybe 4 - 8 shots aweek  . Drug use: Not Currently    Comment: CDB  . Sexual  activity: Not Currently  Other Topics Concern  . Not on file  Social History Narrative   Lives with parents   Caffeine use: minimum- 3 cups per day (on average 6-10 cups per day)   Left handed    Social Determinants of Health   Financial Resource Strain:   . Difficulty of Paying Living Expenses: Not on file  Food Insecurity:   . Worried About Charity fundraiser in the Last Year: Not on file  . Ran Out of Food in the Last Year: Not on file  Transportation Needs:   . Lack of Transportation (Medical): Not on file  . Lack of Transportation (Non-Medical): Not on file  Physical Activity:   . Days of Exercise per Week: Not on file  . Minutes of Exercise per Session: Not on file  Stress:     . Feeling of Stress : Not on file  Social Connections:   . Frequency of Communication with Friends and Family: Not on file  . Frequency of Social Gatherings with Friends and Family: Not on file  . Attends Religious Services: Not on file  . Active Member of Clubs or Organizations: Not on file  . Attends Archivist Meetings: Not on file  . Marital Status: Not on file   Past Surgical History:  Procedure Laterality Date  . LAMINECTOMY N/A 06/01/2019   Procedure: Cervical six-seven Laminoplasty, biopsy and resection of intramedullary spinal cord tumor;  Surgeon: [name], MD;  Location: [facility];  Service: Neurosurgery;  Laterality: N/A;  . NO PAST SURGERIES     Past Surgical History:  Procedure Laterality Date  . LAMINECTOMY N/A 06/01/2019   Procedure: Cervical six-seven Laminoplasty, biopsy and resection of intramedullary spinal cord tumor;  Surgeon: [name], MD;  Location: [facility];  Service: Neurosurgery;  Laterality: N/A;  . NO PAST SURGERIES     Past Medical History:  Diagnosis Date  . Anxiety   . Clonus   . Depression   . Headache   . Pneumonia    "walking"  . Quadriplegia and quadriparesis ([place])   . Spasticity   . Spinal cord tumor   . Wheelchair bound    BP 109/73   Pulse 92   Temp 98.4 F (36.9 C)   Ht 5\' 10"  (1.778 m)   Wt 146 lb 9.6 oz (66.5 kg)   SpO2 98%   BMI 21.03 kg/m   Opioid Risk Score:   Fall Risk Score:  `1  Depression screen PHQ 2/9  Depression screen [facility] 2/9 10/02/2019 08/18/2019  Decreased Interest 1 2  Down, Depressed, Hopeless 1 2  PHQ - 2 Score 2 4   Review of Systems  Constitutional: Negative.   HENT: Negative.   Eyes: Negative.   Respiratory: Negative.   Cardiovascular: Negative.   Gastrointestinal: Negative.   Endocrine: Negative.   Genitourinary:       Burning type of pain down both legs after semen release  Musculoskeletal: Positive for back pain, gait problem and joint swelling.       Hand, knees   Skin: Negative.   Allergic/Immunologic: Negative.   Hematological: Negative.   Psychiatric/Behavioral: Negative.        Objective:   Physical Exam  Awake, alert, appropriate, has rollator with them, NAD No spasms seen at rest- but tapping feet MS: Biceps 4+/5, triceps 4-/5- isolated B/L weaker;  WE 4+ to 5-/5, grip 4+/5, and finger abd 4+ on R and 4/5 on L LE's- HF 5-/5,  KE 4+/5, DF 5-/5 and PF 4/5- isolated weakness B/L)  Neuro: MAS of 1 in UE's- with a little catch at elbows and shoulders-  No clonus in wrists, but just a hair tighter.  Equivocal Hoffman's B/L LE's MAS of 1+ in RLE and MAS of 1 LLE-   Skin: TTP over L knee- mild swelling of L knee.  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   1. Get flu shot now and wait on COVID Booster until 6 month mark- done initially June 2021.    2. Will schedule injection of L knee- steroid injection.   3. Get CMP done- today since on Dantrolene.    4.  Change Dantrolene 100 mg 2x/day  5. Do Dantrolene increased dose for 1 week, THEN decrease Baclofen to 10 mg  4x/day-  The 2nd week.   6. Wait to change nerve pain meds for now- Might need Lyrica vs Duloxetine.    7. F/U in 1 month for injection- another f/u double visit in 2 months.   I spent a total of 1 hour on visit- as detailed above.

## 2019-10-02 NOTE — Patient Instructions (Signed)
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   1. Get flu shot now and wait on COVID Booster until 6 month mark- done initially June 2021.    2. Will schedule injection of L knee- steroid injection.   3. Get CMP done- today since on Dantrolene.    4.  Change Dantrolene to 100 mg 2x/day  5. Do Dantrolene increased dose for 1 week, THEN decrease Baclofen to 10 mg  4x/day-  The 2nd week.   6. Wait to change nerve pain meds for now- Might need Lyrica vs Duloxetine.    7. F/U in 1 month for injection- another f/u double visit in 2 months.

## 2019-10-03 ENCOUNTER — Ambulatory Visit: Payer: BC Managed Care – PPO

## 2019-10-03 DIAGNOSIS — R52 Pain, unspecified: Secondary | ICD-10-CM | POA: Diagnosis not present

## 2019-10-03 DIAGNOSIS — D497 Neoplasm of unspecified behavior of endocrine glands and other parts of nervous system: Secondary | ICD-10-CM | POA: Diagnosis not present

## 2019-10-03 DIAGNOSIS — R2681 Unsteadiness on feet: Secondary | ICD-10-CM

## 2019-10-03 DIAGNOSIS — R2689 Other abnormalities of gait and mobility: Secondary | ICD-10-CM

## 2019-10-03 DIAGNOSIS — M6281 Muscle weakness (generalized): Secondary | ICD-10-CM | POA: Diagnosis not present

## 2019-10-03 LAB — COMPREHENSIVE METABOLIC PANEL
ALT: 32 IU/L (ref 0–44)
AST: 20 IU/L (ref 0–40)
Albumin/Globulin Ratio: 1.7 (ref 1.2–2.2)
Albumin: 4.6 g/dL (ref 4.0–5.0)
Alkaline Phosphatase: 63 IU/L (ref 44–121)
BUN/Creatinine Ratio: 12 (ref 9–20)
BUN: 12 mg/dL (ref 6–20)
Bilirubin Total: <0.2 mg/dL (ref 0.0–1.2)
CO2: 27 mmol/L (ref 20–29)
Calcium: 9.7 mg/dL (ref 8.7–10.2)
Chloride: 102 mmol/L (ref 96–106)
Creatinine, Ser: 0.99 mg/dL (ref 0.76–1.27)
GFR calc Af Amer: 114 mL/min/{1.73_m2} (ref >59)
GFR calc non Af Amer: 99 mL/min/{1.73_m2} (ref >59)
Globulin, Total: 2.7 g/dL (ref 1.5–4.5)
Glucose: 78 mg/dL (ref 65–99)
Potassium: 4.2 mmol/L (ref 3.5–5.2)
Sodium: 142 mmol/L (ref 134–144)
Total Protein: 7.3 g/dL (ref 6.0–8.5)

## 2019-10-03 NOTE — Therapy (Signed)
[facility] [address] [place], [place], [address] Phone: [phone]   Fax:  [phone]  Physical Therapy Treatment  Patient Details  Name: Jacob Lambert MRN: [id number] Date of Birth: 24-Mar-1985 Referring Provider (PT): Dr. Courtney Heys   Encounter Date: 10/03/2019   PT End of Session - 10/03/19 1224    Visit Number 3    Number of Visits 17    Date for PT Re-Evaluation 11/15/19    PT Start Time 9233    PT Stop Time 1100    PT Time Calculation (min) 45 min    Equipment Utilized During Treatment Gait belt    Activity Tolerance Patient tolerated treatment well    Behavior During Therapy [facility] for tasks assessed/performed           Past Medical History:  Diagnosis Date  . Anxiety   . Clonus   . Depression   . Headache   . Pneumonia    "walking"  . Quadriplegia and quadriparesis ([place])   . Spasticity   . Spinal cord tumor   . Wheelchair bound     Past Surgical History:  Procedure Laterality Date  . LAMINECTOMY N/A 06/01/2019   Procedure: Cervical six-seven Laminoplasty, biopsy and resection of intramedullary spinal cord tumor;  Surgeon: [name], MD;  Location: [facility];  Service: Neurosurgery;  Laterality: N/A;  . NO PAST SURGERIES      There were no vitals filed for this visit.   Subjective Assessment - 10/03/19 1220    Subjective No other falls besides the one from last week. Pt reports they were very tired after last session. Patient reports of "vibration" sensation in their lower legs with increased activities. Sometimes "vibration" sensation is stronger at night when relaxing.    Limitations Standing;Walking;House hold activities    How long can you sit comfortably? no issues    How long can you stand comfortably? <5 min    How long can you walk comfortably? <0.25 mile    Patient Stated Goals Progress walking    Currently in Pain? Yes    Pain Score 5     Pain Location Leg    Pain  Onset More than a month ago                Walking from Waiting area to gym, patient's R knee gave out one time with walker.  Neuro Re-ed: towel desensitization to R lower LE . Started with rubbing wash cloth lightly at slower speed from proximal leg and worked towards distal toes. Worked on small section at a time. Also worked on dorsal and plantar aspect of foot and in between toes.  Manually stretched each toes into flexion and extension  Gait training: 1 x 414' with RW, pt reported improved stability and did not have any bouts of R knee feeling weak or giving away during the walk.   Patient education: Pt educated on trial of self desensitization with towel (or smoother clothing material like silk-if towel is too sensitive) at home. Pt educated on soaking his feet and then stretching his toes afterwards in flexion and extension to improve mobility in his foot.                    PT Short Term Goals - 09/20/19 1837      PT SHORT TERM GOAL #1   Title Patient will be able to ambulate 115' with cane to improve short distance ambulation within  home    Baseline RW only    Time 4    Period Weeks    Status New    Target Date 10/18/19      PT SHORT TERM GOAL #2   Title Pt will demo <12 seconds on timed up and go with RW to improve functional mobility    Baseline 14 seconds RW (eval)    Time 4    Period Weeks    Status New    Target Date 10/18/19      PT SHORT TERM GOAL #3   Title Patient will be able to go up and down a ramp and curb step to be able to negotiate community    Baseline not attempted    Time 4    Period Weeks    Status New    Target Date 10/18/19             PT Long Term Goals - 09/20/19 1839      PT LONG TERM GOAL #1   Title Patient will demo >50/56 on berg balance scale to improve functional balance and reduce fall risk    Baseline 40/56 (eval)    Time 8    Period Weeks    Status New    Target Date 11/15/19      PT LONG TERM  GOAL #2   Title Patient will be able to ambulate >500 feet with cane to improve short distance community ambulation.    Baseline only walks with RW    Time 8    Period Weeks    Status New    Target Date 11/15/19      PT LONG TERM GOAL #3   Title Patient will demo 4+/5 strength in bil LE grossly to improve functional strength with transfers    Baseline 3+-4/5 grossly in bil LE    Time 8    Period Weeks    Status New    Target Date 11/15/19      PT LONG TERM GOAL #4   Title Patient will be able to go up and down 12 steps with use of 1-2 rails to be able to go up and down steps at home    Baseline 2 steps (with one rail) (Eval)    Time 8    Period Weeks    Status New    Target Date 11/15/19      PT LONG TERM GOAL #5   Title 6MWT goal    Baseline TBD    Time 8    Period Weeks    Status New    Target Date 11/15/19                 Plan - 10/03/19 1223    Clinical Impression Statement Today's skilled session was focused on desensitization of neuropathy in R LE to improve stability of R LE with walking. Patient reported decreased intensity of his neuropathy after desensitization with towel today. Pt reported he felt more stable with walking and did not report that his leg felt like it was going to give out.    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 add to HEP as indicated    PT Home Exercise Plan Access Code: [email or web address]    Consulted and Agree with Plan of Care Patient           Patient will benefit from skilled therapeutic intervention in order to improve the following deficits and impairments:  Abnormal gait, Decreased activity tolerance, Decreased balance, Decreased cognition, Decreased mobility, Decreased endurance, Decreased coordination, Decreased range of motion, Decreased strength, Dizziness, Difficulty walking, Impaired sensation, Impaired tone, Impaired UE functional use, Postural dysfunction, Pain  Visit Diagnosis: Spinal cord tumor  Muscle weakness (generalized)  Other abnormalities of gait and mobility  Unsteadiness on feet  Pain     Problem List Patient Active Problem List   Diagnosis Date Noted  . Spasticity 08/18/2019  . Chronic pain syndrome 08/18/2019  . Nerve pain 08/18/2019  . Clonus 07/21/2019  . Spinal cord tumor   . Pain   . Transaminitis   . Leukocytosis   . Neurogenic bowel   . Postoperative pain   . 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 Acute incomplete quadriplegia ([facility])   . 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 Numbness 05/10/2019  . Weakness of left lower extremity 05/10/2019  . 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 Visual disturbance 05/10/2019    Jacob Lambert 10/03/2019, 12:25 PM  [facility] [address] [place], [place], [address] Phone: [phone]   Fax:  [phone]  Name: Jacob Lambert MRN: [id number] Date of Birth: September 11, 1985

## 2019-10-06 ENCOUNTER — Other Ambulatory Visit: Payer: Self-pay

## 2019-10-06 ENCOUNTER — Ambulatory Visit: Payer: BC Managed Care – PPO | Attending: Physical Medicine and Rehabilitation | Admitting: Physical Therapy

## 2019-10-06 ENCOUNTER — Encounter: Payer: Self-pay | Admitting: Physical Therapy

## 2019-10-06 DIAGNOSIS — R262 Difficulty in walking, not elsewhere classified: Secondary | ICD-10-CM | POA: Insufficient documentation

## 2019-10-06 DIAGNOSIS — R208 Other disturbances of skin sensation: Secondary | ICD-10-CM | POA: Insufficient documentation

## 2019-10-06 DIAGNOSIS — D497 Neoplasm of unspecified behavior of endocrine glands and other parts of nervous system: Secondary | ICD-10-CM | POA: Diagnosis not present

## 2019-10-06 DIAGNOSIS — M6281 Muscle weakness (generalized): Secondary | ICD-10-CM | POA: Diagnosis not present

## 2019-10-06 DIAGNOSIS — R52 Pain, unspecified: Secondary | ICD-10-CM | POA: Insufficient documentation

## 2019-10-06 DIAGNOSIS — R2681 Unsteadiness on feet: Secondary | ICD-10-CM | POA: Diagnosis not present

## 2019-10-06 DIAGNOSIS — R2689 Other abnormalities of gait and mobility: Secondary | ICD-10-CM | POA: Diagnosis not present

## 2019-10-06 DIAGNOSIS — R293 Abnormal posture: Secondary | ICD-10-CM | POA: Diagnosis not present

## 2019-10-06 DIAGNOSIS — R278 Other lack of coordination: Secondary | ICD-10-CM | POA: Insufficient documentation

## 2019-10-06 DIAGNOSIS — R41842 Visuospatial deficit: Secondary | ICD-10-CM | POA: Insufficient documentation

## 2019-10-06 NOTE — Therapy (Signed)
[facility] [address] [place], [place], [address] Phone: [phone]   Fax:  [phone]  Physical Therapy Treatment  Patient Details  Name: Jacob Lambert MRN: [id number] Date of Birth: 1985-06-10 Referring Provider (PT): Dr. Courtney Heys   Encounter Date: 10/06/2019   PT End of Session - 10/06/19 1411    Visit Number 4    Number of Visits 17    Date for PT Re-Evaluation 11/15/19    PT Start Time 6269    PT Stop Time 1445    PT Time Calculation (min) 42 min    Equipment Utilized During Treatment Gait belt    Activity Tolerance Patient tolerated treatment well    Behavior During Therapy [facility] for tasks assessed/performed           Past Medical History:  Diagnosis Date  . Anxiety   . Clonus   . Depression   . Headache   . Pneumonia    "walking"  . Quadriplegia and quadriparesis ([place])   . Spasticity   . Spinal cord tumor   . Wheelchair bound     Past Surgical History:  Procedure Laterality Date  . LAMINECTOMY N/A 06/01/2019   Procedure: Cervical six-seven Laminoplasty, biopsy and resection of intramedullary spinal cord tumor;  Surgeon: [name], MD;  Location: [facility];  Service: Neurosurgery;  Laterality: N/A;  . NO PAST SURGERIES      There were no vitals filed for this visit.   Subjective Assessment - 10/06/19 1404    Subjective No new complaints. No falls. Started oral CBD everyday yesterday to address Neuropathy after seeing [facility]. Also got new shoes for more support. Increasing his Dantolene per [facility], then will decrease the Bacolfen in a week. Due to all of the changes to his meds the knee injection for pain is being put off till November.    Limitations Standing;Walking;House hold activities    How long can you sit comfortably? no issues    How long can you stand comfortably? <5 min    How long can you walk comfortably? <0.25 mile    Patient Stated Goals Progress walking    Pain  Score 4     Pain Location Generalized   mostly quads down, left hand, upper back and left shoulder area   Pain Orientation Right;Left    Pain Descriptors / Indicators Aching;Burning;Pins and needles;Numbness;Radiating    Pain Type Neuropathic pain    Pain Onset More than a month ago    Pain Frequency Constant    Aggravating Factors  unknown    Pain Relieving Factors pain meds                  [facility] Adult PT Treatment/Exercise - 10/06/19 1411      Transfers   Transfers Sit to Stand;Stand to Sit    Sit to Stand 5: Supervision;With upper extremity assist;From bed;From chair/3-in-1    Stand to Sit 5: Supervision;With upper extremity assist;To bed;To chair/3-in-1      Ambulation/Gait   Ambulation/Gait Yes    Ambulation/Gait Assistance 5: Supervision    Ambulation/Gait Assistance Details no buckling with gait with cues on increase base of support    Ambulation Distance (Feet) 345 Feet    Assistive device Rollator    Gait Pattern Step-through pattern;Decreased stride length;Scissoring;Decreased trunk rotation;Narrow base of support    Ambulation Surface Level;Indoor      Knee/Hip Exercises: Stretches   Active Hamstring Stretch Both;3 reps;30 seconds;Limitations  Active Hamstring Stretch Limitations seated at edge of mat with cues for hold times and to not bounce.       Knee/Hip Exercises: Supine   Bridges AROM;Strengthening;Both;1 set;10 reps;Limitations    Bridges Limitations 3 sec holds for each rep.     Straight Leg Raises AROM;Strengthening;Both;1 set;10 reps;Limitations    Straight Leg Raises Limitations assist needed for controlled movements    Other Supine Knee/Hip Exercises with red band around knees: clamshells with 5 sec holds for 10 reps, cues for controlled movements.                Balance Exercises - 10/06/19 1430      Balance Exercises: Standing   Standing Eyes Closed Narrow base of support (BOS);Wide (BOA);Head turns;Solid surface;Other reps  (comment);Limitations;30 secs    Standing Eyes Closed Limitations on solid floor: wide base, progressing to hip width apart, progressing to feet together for EC 30 sec's for 2 reps each foot position; with feet hip width apart- EC with head movements left<>right, then up<>down with up to min assist needed for balance cue to increased postural sway.                PT Short Term Goals - 09/20/19 1837      PT SHORT TERM GOAL #1   Title Patient will be able to ambulate 115' with cane to improve short distance ambulation within home    Baseline RW only    Time 4    Period Weeks    Status New    Target Date 10/18/19      PT SHORT TERM GOAL #2   Title Pt will demo <12 seconds on timed up and go with RW to improve functional mobility    Baseline 14 seconds RW (eval)    Time 4    Period Weeks    Status New    Target Date 10/18/19      PT SHORT TERM GOAL #3   Title Patient will be able to go up and down a ramp and curb step to be able to negotiate community    Baseline not attempted    Time 4    Period Weeks    Status New    Target Date 10/18/19             PT Long Term Goals - 09/20/19 1839      PT LONG TERM GOAL #1   Title Patient will demo >50/56 on berg balance scale to improve functional balance and reduce fall risk    Baseline 40/56 (eval)    Time 8    Period Weeks    Status New    Target Date 11/15/19      PT LONG TERM GOAL #2   Title Patient will be able to ambulate >500 feet with cane to improve short distance community ambulation.    Baseline only walks with RW    Time 8    Period Weeks    Status New    Target Date 11/15/19      PT LONG TERM GOAL #3   Title Patient will demo 4+/5 strength in bil LE grossly to improve functional strength with transfers    Baseline 3+-4/5 grossly in bil LE    Time 8    Period Weeks    Status New    Target Date 11/15/19      PT LONG TERM GOAL #4   Title Patient will be able to go up and down  12 steps with use of 1-2  rails to be able to go up and down steps at home    Baseline 2 steps (with one rail) (Eval)    Time 8    Period Weeks    Status New    Target Date 11/15/19      PT LONG TERM GOAL #5   Title 6MWT goal    Baseline TBD    Time 8    Period Weeks    Status New    Target Date 11/15/19                 Plan - 10/06/19 1411    Clinical Impression Statement Today's skilled session focused on gait for activity toterance, LE strengthening and static balance with decreased UE support. Mild increase in pain with ex's that pt reports resolved with rest breaks. No other issues noted or reported. The pt is progressing toward goals and should benefit from continued PT to progress toward unmet goals.    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 add to HEP as indicated    PT Home Exercise Plan Access Code: [email or web address]    Consulted and Agree with Plan of Care Patient           Patient will benefit from skilled therapeutic intervention in order  to improve the following deficits and impairments:  Abnormal gait, Decreased activity tolerance, Decreased balance, Decreased cognition, Decreased mobility, Decreased endurance, Decreased coordination, Decreased range of motion, Decreased strength, Dizziness, Difficulty walking, Impaired sensation, Impaired tone, Impaired UE functional use, Postural dysfunction, Pain  Visit Diagnosis: Muscle weakness (generalized)  Other abnormalities of gait and mobility  Unsteadiness on feet     Problem List Patient Active Problem List   Diagnosis Date Noted  . Spasticity 08/18/2019  . Chronic pain syndrome 08/18/2019  . Nerve pain 08/18/2019  . Clonus 07/21/2019  . Spinal cord tumor   . Pain   . Transaminitis   . Leukocytosis   . Neurogenic bowel   . Postoperative pain   . 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 Acute incomplete quadriplegia ([facility])   . 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 Numbness 05/10/2019  . Weakness of left lower extremity 05/10/2019  . 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 Visual disturbance 05/10/2019    Willow Ora, PTA, [facility] [address] [place], [place] [address] [phone] 10/06/19, 4:22 PM   Name: Hyman Crossan MRN: [id number] Date of Birth: 1985-11-09

## 2019-10-09 ENCOUNTER — Ambulatory Visit: Payer: BC Managed Care – PPO | Admitting: Occupational Therapy

## 2019-10-09 ENCOUNTER — Ambulatory Visit: Payer: BC Managed Care – PPO | Admitting: Physical Therapy

## 2019-10-09 ENCOUNTER — Encounter: Payer: Self-pay | Admitting: Occupational Therapy

## 2019-10-09 ENCOUNTER — Other Ambulatory Visit: Payer: Self-pay

## 2019-10-09 DIAGNOSIS — R278 Other lack of coordination: Secondary | ICD-10-CM | POA: Diagnosis not present

## 2019-10-09 DIAGNOSIS — R2689 Other abnormalities of gait and mobility: Secondary | ICD-10-CM

## 2019-10-09 DIAGNOSIS — R208 Other disturbances of skin sensation: Secondary | ICD-10-CM | POA: Diagnosis not present

## 2019-10-09 DIAGNOSIS — M6281 Muscle weakness (generalized): Secondary | ICD-10-CM

## 2019-10-09 DIAGNOSIS — D497 Neoplasm of unspecified behavior of endocrine glands and other parts of nervous system: Secondary | ICD-10-CM

## 2019-10-09 DIAGNOSIS — R2681 Unsteadiness on feet: Secondary | ICD-10-CM

## 2019-10-09 DIAGNOSIS — M7989 Other specified soft tissue disorders: Secondary | ICD-10-CM

## 2019-10-09 DIAGNOSIS — R41842 Visuospatial deficit: Secondary | ICD-10-CM

## 2019-10-09 DIAGNOSIS — R262 Difficulty in walking, not elsewhere classified: Secondary | ICD-10-CM | POA: Diagnosis not present

## 2019-10-09 DIAGNOSIS — R52 Pain, unspecified: Secondary | ICD-10-CM

## 2019-10-09 DIAGNOSIS — R293 Abnormal posture: Secondary | ICD-10-CM | POA: Diagnosis not present

## 2019-10-09 NOTE — Therapy (Addendum)
[facility] [address] [place], [place], [address] Phone: [phone]   Fax:  [phone]  Physical Therapy Treatment  Patient Details  Name: Jacob Lambert MRN: [id number] Date of Birth: 1985/10/30 Referring Provider (PT): Dr. Courtney Heys   Encounter Date: 10/09/2019   PT End of Session - 10/09/19 1711    Visit Number 5    Number of Visits 17    Date for PT Re-Evaluation 11/15/19    PT Start Time 1620    PT Stop Time 1705    PT Time Calculation (min) 45 min    Equipment Utilized During Treatment Gait belt    Activity Tolerance Patient tolerated treatment well    Behavior During Therapy [facility] for tasks assessed/performed           Past Medical History:  Diagnosis Date  . Anxiety   . Clonus   . Depression   . Headache   . Pneumonia    "walking"  . Quadriplegia and quadriparesis ([place])   . Spasticity   . Spinal cord tumor   . Wheelchair bound     Past Surgical History:  Procedure Laterality Date  . LAMINECTOMY N/A 06/01/2019   Procedure: Cervical six-seven Laminoplasty, biopsy and resection of intramedullary spinal cord tumor;  Surgeon: [name], MD;  Location: [facility];  Service: Neurosurgery;  Laterality: N/A;  . NO PAST SURGERIES      There were no vitals filed for this visit.   Subjective Assessment - 10/09/19 1623    Subjective Pt reports increased difficulty with L knee bending since appearance of redness/soreness in their L thigh/knee. Pt reports increased pain this AM but it has come down this PM and mostly feels tenderness in the thigh. Pt reports they are to get imaging for DVT. Pt states the CBD has been going okay. Pt states they are considering flying to [place] in February but is determined to go to [facility] in January.    Limitations Standing;Walking;House hold activities    How long can you sit comfortably? no issues    How long can you stand comfortably? <5 min    How long  can you walk comfortably? <0.25 mile    Patient Stated Goals Progress walking    Currently in Pain? Yes    Pain Score 5    6 to 7 waking up   Pain Location Knee   Generalized but mostly knee pain   Pain Orientation Right    Pain Descriptors / Indicators Aching    Pain Onset More than a month ago                              [facility] Adult PT Treatment/Exercise - 10/09/19 0001      Ambulation/Gait   Ambulation/Gait Yes    Ambulation/Gait Assistance 5: Supervision    Ambulation/Gait Assistance Details no buckling noted    Ambulation Distance (Feet) 115 Feet    Assistive device Rollator    Gait Pattern Step-through pattern;Decreased stride length;Scissoring;Decreased trunk rotation;Narrow base of support    Ambulation Surface Level;Indoor    Gait Comments Limited due to pt c/o increased L LE strain      Knee/Hip Exercises: Stretches   Active Hamstring Stretch Both;30 seconds;Limitations;3 reps    Active Hamstring Stretch Limitations seated at edge of mat with cues for hold times and to not bounce.       Knee/Hip Exercises: Aerobic  Nustep L4 x 8 min      Knee/Hip Exercises: Standing   Rocker Board --               Balance Exercises - 10/09/19 0001      Balance Exercises: Standing   Standing Eyes Closed Narrow base of support (BOS);Head turns;Solid surface;Other reps (comment);Limitations;30 secs;3 reps    Standing Eyes Closed Limitations on solid floor: feet together for EC 30 sec's for 3 reps; EC with head movements left<>right, then up<>down with up to min assist needed for balance cue to increased postural sway.     Rockerboard Anterior/posterior;Lateral;EO;Other time (comment);Intermittent UE support   1 min x 2 sets              PT Short Term Goals - 09/20/19 1837      PT SHORT TERM GOAL #1   Title Patient will be able to ambulate 115' with cane to improve short distance ambulation within home    Baseline RW only    Time 4    Period Weeks     Status New    Target Date 10/18/19      PT SHORT TERM GOAL #2   Title Pt will demo <12 seconds on timed up and go with RW to improve functional mobility    Baseline 14 seconds RW (eval)    Time 4    Period Weeks    Status New    Target Date 10/18/19      PT SHORT TERM GOAL #3   Title Patient will be able to go up and down a ramp and curb step to be able to negotiate community    Baseline not attempted    Time 4    Period Weeks    Status New    Target Date 10/18/19             PT Long Term Goals - 09/20/19 1839      PT LONG TERM GOAL #1   Title Patient will demo >50/56 on berg balance scale to improve functional balance and reduce fall risk    Baseline 40/56 (eval)    Time 8    Period Weeks    Status New    Target Date 11/15/19      PT LONG TERM GOAL #2   Title Patient will be able to ambulate >500 feet with cane to improve short distance community ambulation.    Baseline only walks with RW    Time 8    Period Weeks    Status New    Target Date 11/15/19      PT LONG TERM GOAL #3   Title Patient will demo 4+/5 strength in bil LE grossly to improve functional strength with transfers    Baseline 3+-4/5 grossly in bil LE    Time 8    Period Weeks    Status New    Target Date 11/15/19      PT LONG TERM GOAL #4   Title Patient will be able to go up and down 12 steps with use of 1-2 rails to be able to go up and down steps at home    Baseline 2 steps (with one rail) (Eval)    Time 8    Period Weeks    Status New    Target Date 11/15/19      PT LONG TERM GOAL #5   Title 6MWT goal    Baseline TBD    Time 8  Period Weeks    Status New    Target Date 11/15/19                 Plan - 10/09/19 1712    Clinical Impression Statement Due to pt's concern for muscle strain and DVT, minimized ambulation and focused on activity tolerance with nu-step. Treatment focused heavily on progressing static & dynamic balance with feet together with less UE support.  No increase in pain noted. PT will continue to progress pt as tolerated.    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 add to HEP as indicated    PT Home Exercise Plan Access Code: [email or web address]    Consulted and Agree with Plan of Care Patient           Patient will benefit from skilled therapeutic intervention in order to improve the following deficits and impairments:  Abnormal gait, Decreased activity tolerance, Decreased balance, Decreased cognition, Decreased mobility, Decreased endurance, Decreased coordination, Decreased range of motion, Decreased strength, Dizziness, Difficulty walking, Impaired sensation, Impaired tone, Impaired UE functional use, Postural dysfunction, Pain  Visit Diagnosis: Muscle weakness (generalized)  Other abnormalities of gait and mobility  Unsteadiness on feet  Spinal cord tumor  Pain     Problem List Patient Active Problem List   Diagnosis Date Noted  . Spasticity 08/18/2019  . Chronic pain syndrome  08/18/2019  . Nerve pain 08/18/2019  . Clonus 07/21/2019  . Spinal cord tumor   . Pain   . Transaminitis   . Leukocytosis   . Neurogenic bowel   . Postoperative pain   . 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 Acute incomplete quadriplegia ([facility])   . 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 Numbness 05/10/2019  . Weakness of left lower extremity 05/10/2019  . 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 Visual disturbance 05/10/2019    Meloney Feld April Ma L Yossi Hinchman PT, DPT 10/09/2019, 5:20 PM  [facility] [address] [place], [place], [address] Phone: [phone]   Fax:  [phone]  Name: Jacob Lambert MRN: [id number] Date of Birth: Apr 10, 1985

## 2019-10-09 NOTE — Therapy (Signed)
[facility] [address] [place], [place], [address] Phone: [phone]   Fax:  [phone]  Occupational Therapy Evaluation  Patient Details  Name: Jacob Lambert MRN: [id number] Date of Birth: September 18, 1985 Referring Provider (OT): Dr. Courtney Heys   Encounter Date: 10/09/2019   OT End of Session - 10/09/19 1809    Visit Number 1    Number of Visits 25    Date for OT Re-Evaluation 01/01/20    Authorization Time Period 30 visit limit - budgeted for out of pocket after    OT Start Time 1702    OT Stop Time 1812    OT Time Calculation (min) 70 min    Activity Tolerance Patient tolerated treatment well    Behavior During Therapy [facility] for tasks assessed/performed           Past Medical History:  Diagnosis Date  . Anxiety   . Clonus   . Depression   . Headache   . Pneumonia    "walking"  . Quadriplegia and quadriparesis ([place])   . Spasticity   . Spinal cord tumor   . Wheelchair bound     Past Surgical History:  Procedure Laterality Date  . LAMINECTOMY N/A 06/01/2019   Procedure: Cervical six-seven Laminoplasty, biopsy and resection of intramedullary spinal cord tumor;  Surgeon: [name], MD;  Location: [facility];  Service: Neurosurgery;  Laterality: N/A;  . NO PAST SURGERIES      There were no vitals filed for this visit.   Subjective Assessment - 10/09/19 1712    Subjective  Pt had radiation that finished September 19, 2019. Regression reported due to radiation. Pt is a 34 year old non binary individual that presents to outpatient occupational therapy for overall weakness s/p cervical spinal cord tumor resection and c6-C7 laminoplasty in May 20201. PMH of depression but no other significant past medical history. Pt was independent with all ADLs and IADLs prior to this episode.    Pertinent History PMH depression    Limitations Fall Risk.    Patient Stated Goals "I want to have as much functional  independence as possible" more dexterity and typing skills for work, wear tights and wear heels    Currently in Pain? Yes    Pain Score 4     Pain Location Finger (Comment which one)   middle finger and numbness on 4th and 5th   Pain Orientation Left    Pain Descriptors / Indicators Numbness;Aching    Pain Type Chronic pain    Pain Onset More than a month ago    Pain Frequency Constant    Aggravating Factors  touching the middle finger    Pain Relieving Factors CBD cream               [facility] OT Assessment - 10/10/19 0001      Assessment   Medical Diagnosis Weakness (cervical spinal cord tumor resection/post radiation)    Referring Provider (OT) Dr. Courtney Heys    Onset Date/Surgical Date 06/01/19    Hand Dominance Left    Prior Therapy [facility] inpatient rehab, home health      Precautions   Precautions Fall      Restrictions   Weight Bearing Restrictions No      Balance Screen   Has the patient fallen in the past 6 months Yes    How many times? --   "a good number"     Home  Environment   Family/patient  expects to be discharged to: Private residence    Living Arrangements Parent    Available Help at Discharge Family    Type of [place] to live on main level with bedroom/bathroom    Bathroom Shower/Tub Walk-in Shower    Bathroom Toilet Handicapped height    Bathroom Accessibility Yes    Home Equipment [place] - 2 wheels;Walker - 4 wheels;Cane - single point;Shower seat;Hand held shower head;Wheelchair - manual    Lives With Family      Prior Function   Level of Independence Independent    Vocation Full time employment    Vocation Requirements [place]    Leisure travel the world and travel a lot, arts      ADL   Eating/Feeding Supervision/safety    Grooming Minimal assistance   shaving head is difficult - doing but cutting self Electrical engineer Supervision/safety    Lower Body Bathing  Supervision/safety    Upper Body Dressing Needs assist for fasteners;Minimal assistance    Lower Body Dressing Needs assist for fasteners;Minimal assistance    Toilet Transfer Supervision/safety    Toileting - Clothing Manipulation Minimal assistance    Toileting -  Hygiene Modified Independent    Tub/Shower Transfer Supervision/safety    ADL comments reporting increased difficulty iwth fasteners and zippers, etc.       IADL   Prior Level of Function Shopping independent   independent PLOF   Shopping Completely unable to shop    Light Housekeeping [place] small items, rinses stockings, etc.;Performs light daily tasks such as dishwashing, bed making    Meal Prep Able to complete simple warm meal prep;Able to complete simple cold meal and snack prep    Community Mobility Relies on family or friends for transportation    Medication Management Is responsible for taking medication in correct dosages at correct time    Financial Management Manages day-to-day purchases, but needs help with banking, major purchases, etc.      Vision - History   Baseline Vision Legally blind   Right eye only     Vision Assessment   Acuity Visual acuity right eye (comment);Visual acuity left eye (comment)   left 20/30 right 20/200   Diplopia Assessment Disappears with one eye closed   right eye occluded. has increased since cancer   Comment legally blind in right eye - prior level. Reports diplopia.      Cognition   Overall Cognitive Status Within Functional Limits for tasks assessed      Sensation   Light Touch Impaired Detail    Light Touch Impaired Details Impaired LUE    Hot/Cold Impaired Detail    Hot/Cold Impaired Details Impaired LUE   ulnar region   Proprioception Impaired Detail      Coordination   9 Hole Peg Test Right;Left    Right 9 Hole Peg Test 37.84    Left 9 Hole Peg Test 38.57    Box and Blocks RUE 58 LUE 43      AROM   Overall AROM  Within functional limits for tasks performed        Hand Function   Right Hand Grip (lbs) 33.2    Left Hand Grip (lbs) 35.4                           OT Education - 10/09/19 1826  Education Details Education provided on role and purpose of OT    Person(s) Educated Patient    Methods Explanation    Comprehension Verbalized understanding            OT Short Term Goals - 10/09/19 1822      OT SHORT TERM GOAL #1   Title Pt will be independent with HEP for coordination and strengthening 11/06/19    Time 4    Period Weeks    Status New    Target Date 11/06/19      OT SHORT TERM GOAL #2   Title Pt will increase grip strength in BUE by 5 lbs in order to increase ability to manage clothing (tights, socks, pants) and increased independence with ADLs.    Baseline RUE 33.2 lbs LUE 35.4 lbs    Time 4    Period Weeks    Status New      OT SHORT TERM GOAL #3   Title Pt will improve functional use of LUE as evidenced by performing 50 blocks or more on Box & Blocks test.    Baseline LUE 43 blocks    Time 4    Period Weeks    Status New      OT SHORT TERM GOAL #4   Title Pt will perform complex meal prep and/or housekeeping tasks with mod I and showing good safety awareness in order to increase independence with IADLs.    Baseline --    Time 4    Period Weeks    Status New      OT SHORT TERM GOAL #5   Title Pt will verbalize compensatory strategies for visual deficits.    Time 4    Period Weeks    Status New      OT SHORT TERM GOAL #6   Title Pt will improve 9 hole peg test score to 33 seconds or less with BUE in order to increase fine motor coordination in order to increase independence with fasteners and jewelry, etc.    Time 4    Period Weeks    Status New      OT SHORT TERM GOAL #7   Title Pt will obtain a lightweight object from higher level cabinet with LUE, dominant hand, for increasing independence with ADLs and IADLs.    Time 4    Period Weeks    Status New      OT SHORT TERM GOAL #8   Title  Pt will increase keyboarding/typing skills to completing 30 wpm in order to prep for back to work.    Time 4    Period Weeks    Status New             OT Long Term Goals - 10/10/19 1502      OT LONG TERM GOAL #1   Title Pt will be independent with updated HEP for coordination and strengthening 01/02/20    Time 12    Period Weeks    Status New    Target Date 01/02/20      OT LONG TERM GOAL #2   Title Pt will increase grip strength in BUE by 10 lbs in order to increase ability to manage clothing (tights, socks, pants) and increased independence with ADLs.    Baseline LUE 35.4 RUE 33.2    Time 12    Period Weeks    Status New      OT LONG TERM GOAL #3   Title Pt will improve  functional use of LUE as evidenced by performing 60 blocks or more on Box & Blocks test.    Baseline LUE 43    Time 12    Period Weeks    Status New      OT LONG TERM GOAL #4   Title Pt will improve 9 hole peg test score to 25 seconds or less with BUE in order to increase fine motor coordination in order to increase independence with fasteners and jewelry, etc.    Baseline RUE 37.84 LUE 38.57    Time 12    Period Weeks    Status New      OT LONG TERM GOAL #5   Title Pt will obtain an object from cabinet greater than or equal to 5lbs with LUE, dominant hand, for increasing independence with ADLs and IADLs.    Time 12    Period Weeks    Status New      OT LONG TERM GOAL #6   Title Pt will increase keyboarding/typing skills to completing 50 wpm in order to prep for back to work.    Time 12    Period Weeks    Status New                 Plan - 10/09/19 1815    Clinical Impression Statement Pt is a 34 year old non binary individual that presents to outpatient occupational therapy for overall weakness s/p cervical spinal cord tumor resection and c6-C7 laminoplasty in May 20201. PMH of depression but no other significant past medical history. Pt was independent with all ADLs and IADLs prior to  this episode. Pt presents with deficits in strength, visual deficits, sensation deficits and coordination impeding ability to complete ADLs and IADLs with independence. Skilled occupational therapy is recommended to target listed areas of deficits and maximize independence in ADLs and IADLs.    OT Occupational Profile and History Detailed Assessment- Review of Records and additional review of physical, cognitive, psychosocial history related to current functional performance    Occupational performance deficits (Please refer to evaluation for details): IADL's;ADL's;Work;Social Participation;Leisure    Body Structure / Function / Physical Skills Strength;Gait;Decreased knowledge of use of DME;ADL;Balance;Dexterity;GMC;Pain;Tone;Body mechanics;UE functional use;Endurance;IADL;ROM;Vision;Coordination;Flexibility;Mobility;Sensation;FMC    Rehab Potential Good    Clinical Decision Making Limited treatment options, no task modification necessary    Comorbidities Affecting Occupational Performance: May have comorbidities impacting occupational performance    Modification or Assistance to Complete Evaluation  No modification of tasks or assist necessary to complete eval    OT Frequency 2x / week    OT Duration 12 weeks    OT Treatment/Interventions Self-care/ADL training;Moist Heat;Fluidtherapy;DME and/or AE instruction;Balance training;Therapeutic activities;Gait Training;Ultrasound;Therapeutic exercise;Scar mobilization;Visual/perceptual remediation/compensation;Passive range of motion;Functional Mobility Training;Neuromuscular education;Electrical Stimulation;Paraffin;Energy conservation;Manual Therapy;Patient/family education    Plan HEP for coordination, grip strengthening           Patient will benefit from skilled therapeutic intervention in order to improve the following deficits and impairments:   Body Structure / Function / Physical Skills: Strength, Gait, Decreased knowledge of use of DME, ADL,  Balance, Dexterity, GMC, Pain, Tone, Body mechanics, UE functional use, Endurance, IADL, ROM, Vision, Coordination, Flexibility, Mobility, Sensation, [facility]       Visit Diagnosis: Other lack of coordination - Plan: Ot plan of care cert/re-cert  Other disturbances of skin sensation - Plan: Ot plan of care cert/re-cert  Muscle weakness (generalized) - Plan: Ot plan of care cert/re-cert  Visuospatial deficit - Plan: Ot plan of care cert/re-cert  Other abnormalities of gait and mobility - Plan: Ot plan of care cert/re-cert  Unsteadiness on feet - Plan: Ot plan of care cert/re-cert    Problem List Patient Active Problem List   Diagnosis Date Noted  . Spasticity 08/18/2019  . Chronic pain syndrome 08/18/2019  . Nerve pain 08/18/2019  . Clonus 07/21/2019  . Spinal cord tumor   . Pain   . Transaminitis   . Leukocytosis   . Neurogenic bowel   . Postoperative pain   . 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 Acute incomplete quadriplegia ([facility])   . 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 Numbness 05/10/2019  . Weakness of left lower extremity 05/10/2019  . 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 Visual disturbance 05/10/2019    Zachery Conch MOT, OTR/L  10/10/2019, 3:22 PM  [facility] [address] [place], [place], [address] Phone: [phone]   Fax:  [phone]  Name: Irvan Tiedt MRN: [id number] Date of Birth: [birth year]

## 2019-10-10 ENCOUNTER — Telehealth: Payer: Self-pay | Admitting: Physical Medicine and Rehabilitation

## 2019-10-10 ENCOUNTER — Ambulatory Visit (HOSPITAL_COMMUNITY)
Admission: RE | Admit: 2019-10-10 | Discharge: 2019-10-10 | Disposition: A | Discharge status: Home / Self Care | Payer: BC Managed Care – PPO | Source: Ambulatory Visit | Attending: Physical Medicine and Rehabilitation | Admitting: Physical Medicine and Rehabilitation

## 2019-10-10 DIAGNOSIS — M7989 Other specified soft tissue disorders: Secondary | ICD-10-CM | POA: Diagnosis not present

## 2019-10-10 NOTE — Telephone Encounter (Signed)
Called and let pt and father who's an MD know the Dopplers/ test was negative.   Sounds like they likely pulled a muscle in therapy last week.  From what father said.

## 2019-10-10 NOTE — Progress Notes (Signed)
Left lower extremity venous duplex has been completed. Preliminary results can be found in CV Proc through chart review.  Results were faxed to Dr. Dagoberto Ligas.  10/10/19 10:03 AM Jacob Lambert RVT

## 2019-10-13 ENCOUNTER — Encounter: Payer: Self-pay | Admitting: Occupational Therapy

## 2019-10-13 ENCOUNTER — Ambulatory Visit: Payer: BC Managed Care – PPO | Admitting: Physical Therapy

## 2019-10-13 ENCOUNTER — Other Ambulatory Visit: Payer: Self-pay

## 2019-10-13 ENCOUNTER — Ambulatory Visit: Payer: BC Managed Care – PPO | Admitting: Occupational Therapy

## 2019-10-13 ENCOUNTER — Encounter: Payer: Self-pay | Admitting: Physical Therapy

## 2019-10-13 DIAGNOSIS — R41842 Visuospatial deficit: Secondary | ICD-10-CM

## 2019-10-13 DIAGNOSIS — R278 Other lack of coordination: Secondary | ICD-10-CM | POA: Diagnosis not present

## 2019-10-13 DIAGNOSIS — R2689 Other abnormalities of gait and mobility: Secondary | ICD-10-CM

## 2019-10-13 DIAGNOSIS — R2681 Unsteadiness on feet: Secondary | ICD-10-CM | POA: Diagnosis not present

## 2019-10-13 DIAGNOSIS — R208 Other disturbances of skin sensation: Secondary | ICD-10-CM | POA: Diagnosis not present

## 2019-10-13 DIAGNOSIS — R262 Difficulty in walking, not elsewhere classified: Secondary | ICD-10-CM

## 2019-10-13 DIAGNOSIS — R293 Abnormal posture: Secondary | ICD-10-CM | POA: Diagnosis not present

## 2019-10-13 DIAGNOSIS — M6281 Muscle weakness (generalized): Secondary | ICD-10-CM | POA: Diagnosis not present

## 2019-10-13 DIAGNOSIS — D497 Neoplasm of unspecified behavior of endocrine glands and other parts of nervous system: Secondary | ICD-10-CM | POA: Diagnosis not present

## 2019-10-13 DIAGNOSIS — R52 Pain, unspecified: Secondary | ICD-10-CM | POA: Diagnosis not present

## 2019-10-13 NOTE — Therapy (Signed)
[facility] [address] [place], [place], [address] Phone: [phone]   Fax:  [phone]  Physical Therapy Treatment  Patient Details  Name: Jacob Lambert MRN: [id number] Date of Birth: December 16, 1985 Referring Provider (PT): Dr. Courtney Heys   Encounter Date: 10/13/2019   PT End of Session - 10/13/19 1509    Visit Number 6    Number of Visits 17    Date for PT Re-Evaluation 11/15/19    PT Start Time 1400    PT Stop Time 1445    PT Time Calculation (min) 45 min    Equipment Utilized During Treatment Gait belt    Activity Tolerance Patient tolerated treatment well;Patient limited by fatigue    Behavior During Therapy [name] for tasks assessed/performed           Past Medical History:  Diagnosis Date  . Anxiety   . Clonus   . Depression   . Headache   . Pneumonia    "walking"  . Quadriplegia and quadriparesis ([place])   . Spasticity   . Spinal cord tumor   . Wheelchair bound     Past Surgical History:  Procedure Laterality Date  . LAMINECTOMY N/A 06/01/2019   Procedure: Cervical six-seven Laminoplasty, biopsy and resection of intramedullary spinal cord tumor;  Surgeon: [name], MD;  Location: [facility];  Service: Neurosurgery;  Laterality: N/A;  . NO PAST SURGERIES      There were no vitals filed for this visit.   Subjective Assessment - 10/13/19 1403    Subjective Pt reports that L thigh tenderness has improved but it still has a burning sensation.    Limitations Standing;Walking;House hold activities    How long can you sit comfortably? no issues    How long can you stand comfortably? <5 min    How long can you walk comfortably? <0.25 mile    Patient Stated Goals Progress walking    Currently in Pain? Yes    Pain Score 5     Pain Location Leg    Pain Orientation Left    Pain Type Chronic pain              [facility] PT Assessment - 10/13/19 0001      Ambulation/Gait   Ambulation/Gait  Yes    Ambulation/Gait Assistance 4: Min assist;3: Mod assist    Ambulation Distance (Feet) 115 Feet    Assistive device Small based quad cane      Timed Up and Go Test   Normal TUG (seconds) 13   14s trial 1, 13s trial 2, 13s trial 3                        [facility] Adult PT Treatment/Exercise - 10/13/19 0001      Ambulation/Gait   Gait Pattern Step-through pattern;Narrow base of support;Lateral trunk lean to right;Scissoring    Ambulation Surface Level;Indoor    Gait Comments Assist due to multi LOB and scissoring gait 25% of the time during gait, cues for knee extension and decreased step       Exercises   Other Exercises  R foot on 2" step x10 horizontal and vertical head turns; L foot on 2" step x10 horizontal and vertical head turns      Knee/Hip Exercises: Standing   Heel Raises Both;20 reps    Lateral Step Up Both;Hand Hold: 1;10 reps    Forward Step Up Both;10 reps;Hand Hold: 1  Step Down Both;10 reps;Hand Hold: 1    Other Standing Knee Exercises Standing on incline with L foot in front and then R foot in front x10 sec with L hand support, R hand support, and then no support    Other Standing Knee Exercises Standing on decline with L foot in front and then R foot in front x10 sec with L hand support, R hand support, and then no support   min A due to posterior lean with fatigue                 PT Education - 10/13/19 1514    Education Details Discussed STG and where he is in regards to progress.    Person(s) Educated Patient    Methods Explanation;Demonstration;Verbal cues;Handout    Comprehension Verbalized understanding;Returned demonstration;Verbal cues required;Need further instruction            PT Short Term Goals - 10/13/19 1503      PT SHORT TERM GOAL #1   Title Patient will be able to ambulate 115' with cane to improve short distance ambulation within home    Baseline 115' with min/mod A with quad cane due to multi LOB and scissoring  25% (10/13/19)    Time 4    Period Weeks    Status Partially Met    Target Date 10/18/19      PT SHORT TERM GOAL #2   Title Pt will demo <12 seconds on timed up and go with RW to improve functional mobility    Baseline 14 seconds RW (eval); 13 seconds rollator (10/13/19)    Time 4    Period Weeks    Status New    Target Date 10/18/19      PT SHORT TERM GOAL #3   Title Patient will be able to go up and down a ramp and curb step to be able to negotiate community    Baseline CGA with rollator for 3' long ramp, forward and over aerobic stepper acting as curb x10 (10/13/19)    Time 4    Period Weeks    Status Achieved    Target Date 10/18/19             PT Long Term Goals - 09/20/19 1839      PT LONG TERM GOAL #1   Title Patient will demo >50/56 on berg balance scale to improve functional balance and reduce fall risk    Baseline 40/56 (eval)    Time 8    Period Weeks    Status New    Target Date 11/15/19      PT LONG TERM GOAL #2   Title Patient will be able to ambulate >500 feet with cane to improve short distance community ambulation.    Baseline only walks with RW    Time 8    Period Weeks    Status New    Target Date 11/15/19      PT LONG TERM GOAL #3   Title Patient will demo 4+/5 strength in bil LE grossly to improve functional strength with transfers    Baseline 3+-4/5 grossly in bil LE    Time 8    Period Weeks    Status New    Target Date 11/15/19      PT LONG TERM GOAL #4   Title Patient will be able to go up and down 12 steps with use of 1-2 rails to be able to go up and down steps at home  Baseline 2 steps (with one rail) (Eval)    Time 8    Period Weeks    Status New    Target Date 11/15/19      PT LONG TERM GOAL #5   Title 6MWT goal    Baseline TBD    Time 8    Period Weeks    Status New    Target Date 11/15/19                 Plan - 10/13/19 1510    Clinical Impression Statement Re-assessed pt's STGs. Pt met STG 3. In regards to  STG 1, pt is able to amb with small based quad cane for 115'; however, they are not safe without assist due to multiple LOBs and narrow BOS. In regards to STG 2, pt's TUG has improved by 1 sec with using their rollator. Treatment focused on ambulation on various incline, stepping, and balance. Pt with mild increased pain/soreness in knees after session; discussed using CBD oil tonight since pt does not like sensation of cold.    Personal Factors and Comorbidities Age;Past/Current Experience;Comorbidity 2;Time since onset of injury/illness/exacerbation;Transportation    Examination-Activity Limitations Bathing;Lift;Squat;Stairs;Stand;Transfers    Examination-Participation Restrictions Cleaning;Community Activity;Driving;Laundry;Occupation;Shop;Yard Work    Product manager    Rehab Potential Fair    PT Frequency 2x / week    PT Duration 8 weeks    PT Treatment/Interventions ADLs/Self Care Home Management;Gait training;Stair training;Functional mobility training;Therapeutic activities;Therapeutic exercise;Balance training;Neuromuscular re-education;Manual techniques;Wheelchair mobility training;Patient/family education;Passive range of motion;Energy conservation;Vestibular;Visual/perceptual remediation/compensation;Joint Manipulations    PT Next Visit Plan continue to work on LE strengthening, stepping, balance on solid surfaces progressing to compliant surfaces with decreased UE support. Progress gait away from rollator as is safe for pt. Add to HEP as indicated    PT Home Exercise Plan Access Code: [email or web address]    Consulted and Agree with Plan of Care Patient           Patient will benefit from skilled therapeutic intervention in order to improve the following deficits and impairments:  Abnormal gait, Decreased activity tolerance, Decreased balance, Decreased cognition, Decreased mobility, Decreased endurance, Decreased coordination, Decreased range of  motion, Decreased strength, Dizziness, Difficulty walking, Impaired sensation, Impaired tone, Impaired UE functional use, Postural dysfunction, Pain  Visit Diagnosis: Abnormal posture  Difficulty in walking, not elsewhere classified  Muscle weakness (generalized)  Other abnormalities of gait and mobility     Problem List Patient Active Problem List   Diagnosis Date Noted  . Spasticity 08/18/2019  . Chronic pain syndrome 08/18/2019  . Nerve pain 08/18/2019  . Clonus 07/21/2019  . Spinal cord tumor   . Pain   . Transaminitis   . Leukocytosis   . Neurogenic bowel   . Postoperative pain   . 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 Acute incomplete quadriplegia ([facility])   . 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 Numbness 05/10/2019  . Weakness of left lower extremity 05/10/2019  . 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 Visual disturbance 05/10/2019    Chelci [place] April Ma L Fleda Pagel PT, DPT 10/13/2019, 3:18 PM  [facility] [address] [place], [place], [address] Phone: [phone]   Fax:  [phone]  Name: Jacob Lambert MRN: [id number] Date of Birth: July 26, 1985

## 2019-10-13 NOTE — Patient Instructions (Signed)
1. Grip Strengthening (Resistive Putty)   Squeeze putty using thumb and all fingers. Repeat _20___ times. Do __2__ sessions per day.   2. Roll putty into tube on table and pinch between each finger and thumb x 10 reps each. (can do ring and small finger together)     Copyright  VHI. All rights reserved.   

## 2019-10-13 NOTE — Patient Instructions (Signed)
One foot on small book/step and holding balance with head turns and head nods x 10. Alternate to other foot.

## 2019-10-13 NOTE — Therapy (Signed)
[facility] [address] [place], [place], [address] Phone: [phone]   Fax:  [phone]  Occupational Therapy Treatment  Patient Details  Name: Jacob Lambert MRN: [id number] Date of Birth: July 06, 1985 Referring Provider (OT): Dr. Courtney Heys   Encounter Date: 10/13/2019   OT End of Session - 10/13/19 1447    Visit Number 2    Number of Visits 25    Date for OT Re-Evaluation 01/01/20    Authorization Time Period 30 visit limit - week 1 of 12 (10/8)    OT Start Time 1449    OT Stop Time 1530    OT Time Calculation (min) 41 min    Activity Tolerance Patient tolerated treatment well    Behavior During Therapy [facility] for tasks assessed/performed           Past Medical History:  Diagnosis Date  . Anxiety   . Clonus   . Depression   . Headache   . Pneumonia    "walking"  . Quadriplegia and quadriparesis ([place])   . Spasticity   . Spinal cord tumor   . Wheelchair bound     Past Surgical History:  Procedure Laterality Date  . LAMINECTOMY N/A 06/01/2019   Procedure: Cervical six-seven Laminoplasty, biopsy and resection of intramedullary spinal cord tumor;  Surgeon: [name], MD;  Location: [facility];  Service: Neurosurgery;  Laterality: N/A;  . NO PAST SURGERIES      There were no vitals filed for this visit.   Subjective Assessment - 10/13/19 1451    Subjective  Pt reports pain post PT session.    Pertinent History PMH depression    Limitations Fall Risk.    Patient Stated Goals "I want to have as much functional independence as possible" more dexterity and typing skills for work, wear tights and wear heels    Currently in Pain? Yes    Pain Score 6     Pain Location Knee    Pain Descriptors / Indicators Pounding    Pain Type Acute pain    Pain Onset More than a month ago    Pain Frequency Intermittent              Treatment:  Medium Pegs with LUE and following mod complex patterns. No  difficulty.  Yellow theraputty with BUE - see pt instructions. Pt reports some/min discomfort with pressure.  Number dice and rotating ball in BUE - no difficulty  Resistance Clothespins - 1-8# resistance and placing on antenna with LUE, removed and placed back down with RUE . 3/10 sore pain with LUE with shoulder flexion. Focused on 4th and 5th digit pinch with BUE 2nd time with red and yellow clothespins.  Grooved Peg board - LUE with min increased time and difficulty rotating  Hand Gripper with level 1 with BUE and 1 inch blocks min drops but fatigue present        OT Education - 10/13/19 1458    Education Details Issued yellow theraputty HEP - see pt instructions    Person(s) Educated Patient    Methods Explanation;Demonstration    Comprehension Verbalized understanding;Returned demonstration            OT Short Term Goals - 10/13/19 1500      OT SHORT TERM GOAL #1   Title Pt will be independent with HEP for coordination and strengthening 11/06/19    Time 4    Period Weeks    Status On-going  yellow theraputty 10/8   Target Date 11/06/19      OT SHORT TERM GOAL #2   Title Pt will increase grip strength in BUE by 5 lbs in order to increase ability to manage clothing (tights, socks, pants) and increased independence with ADLs.    Baseline RUE 33.2 lbs LUE 35.4 lbs    Time 4    Period Weeks    Status New      OT SHORT TERM GOAL #3   Title Pt will improve functional use of LUE as evidenced by performing 50 blocks or more on Box & Blocks test.    Baseline LUE 43 blocks    Time 4    Period Weeks    Status New      OT SHORT TERM GOAL #4   Title Pt will perform complex meal prep and/or housekeeping tasks with mod I and showing good safety awareness in order to increase independence with IADLs.    Time 4    Period Weeks    Status New      OT SHORT TERM GOAL #5   Title Pt will verbalize compensatory strategies for visual deficits.    Time 4    Period Weeks     Status New      OT SHORT TERM GOAL #6   Title Pt will improve 9 hole peg test score to 33 seconds or less with BUE in order to increase fine motor coordination in order to increase independence with fasteners and jewelry, etc.    Time 4    Period Weeks    Status New      OT SHORT TERM GOAL #7   Title Pt will obtain a lightweight object from higher level cabinet with LUE, dominant hand, for increasing independence with ADLs and IADLs.    Time 4    Period Weeks    Status New      OT SHORT TERM GOAL #8   Title Pt will increase keyboarding/typing skills to completing 30 wpm in order to prep for back to work.    Time 4    Period Weeks    Status New             OT Long Term Goals - 10/10/19 1502      OT LONG TERM GOAL #1   Title Pt will be independent with updated HEP for coordination and strengthening 01/02/20    Time 12    Period Weeks    Status New    Target Date 01/02/20      OT LONG TERM GOAL #2   Title Pt will increase grip strength in BUE by 10 lbs in order to increase ability to manage clothing (tights, socks, pants) and increased independence with ADLs.    Baseline LUE 35.4 RUE 33.2    Time 12    Period Weeks    Status New      OT LONG TERM GOAL #3   Title Pt will improve functional use of LUE as evidenced by performing 60 blocks or more on Box & Blocks test.    Baseline LUE 43    Time 12    Period Weeks    Status New      OT LONG TERM GOAL #4   Title Pt will improve 9 hole peg test score to 25 seconds or less with BUE in order to increase fine motor coordination in order to increase independence with fasteners and jewelry, etc.    Baseline  RUE 37.84 LUE 38.57    Time 12    Period Weeks    Status New      OT LONG TERM GOAL #5   Title Pt will obtain an object from cabinet greater than or equal to 5lbs with LUE, dominant hand, for increasing independence with ADLs and IADLs.    Time 12    Period Weeks    Status New      OT LONG TERM GOAL #6   Title Pt  will increase keyboarding/typing skills to completing 50 wpm in order to prep for back to work.    Time 12    Period Weeks    Status New                 Plan - 10/13/19 1448    Clinical Impression Statement Pt returns to OT after evaluation. Week 1 of 12. Initiated working towards goals and strengthening and coordination with Betances.    OT Occupational Profile and History Detailed Assessment- Review of Records and additional review of physical, cognitive, psychosocial history related to current functional performance    Occupational performance deficits (Please refer to evaluation for details): IADL's;ADL's;Work;Social Participation;Leisure    Body Structure / Function / Physical Skills Strength;Gait;Decreased knowledge of use of DME;ADL;Balance;Dexterity;GMC;Pain;Tone;Body mechanics;UE functional use;Endurance;IADL;ROM;Vision;Coordination;Flexibility;Mobility;Sensation;FMC    Rehab Potential Good    Clinical Decision Making Limited treatment options, no task modification necessary    Comorbidities Affecting Occupational Performance: May have comorbidities impacting occupational performance    Modification or Assistance to Complete Evaluation  No modification of tasks or assist necessary to complete eval    OT Frequency 2x / week    OT Duration 12 weeks    OT Treatment/Interventions Self-care/ADL training;Moist Heat;Fluidtherapy;DME and/or AE instruction;Balance training;Therapeutic activities;Gait Training;Ultrasound;Therapeutic exercise;Scar mobilization;Visual/perceptual remediation/compensation;Passive range of motion;Functional Mobility Training;Neuromuscular education;Electrical Stimulation;Paraffin;Energy conservation;Manual Therapy;Patient/family education    Plan grip strengthening, stereognosis, functional balance, yellow theraband    Consulted and Agree with Plan of Care Patient           Patient will benefit from skilled therapeutic intervention in order to improve the  following deficits and impairments:   Body Structure / Function / Physical Skills: Strength, Gait, Decreased knowledge of use of DME, ADL, Balance, Dexterity, GMC, Pain, Tone, Body mechanics, UE functional use, Endurance, IADL, ROM, Vision, Coordination, Flexibility, Mobility, Sensation, [facility]       Visit Diagnosis: Other lack of coordination  Muscle weakness (generalized)  Other disturbances of skin sensation  Visuospatial deficit  Unsteadiness on feet    Problem List Patient Active Problem List   Diagnosis Date Noted  . Spasticity 08/18/2019  . Chronic pain syndrome 08/18/2019  . Nerve pain 08/18/2019  . Clonus 07/21/2019  . Spinal cord tumor   . Pain   . Transaminitis   . Leukocytosis   . Neurogenic bowel   . Postoperative pain   . 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 Acute incomplete quadriplegia ([facility])   . 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 Numbness 05/10/2019  . Weakness of left lower extremity 05/10/2019  . 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 Visual disturbance 05/10/2019    Zachery Conch MOT, OTR/L  10/13/2019, 3:29 PM  [facility] [address] [place], [place], [address] Phone: [phone]   Fax:  [phone]  Name: Jacob Lambert MRN: [id number] Date of Birth: 1985-03-09

## 2019-10-18 ENCOUNTER — Ambulatory Visit: Payer: BC Managed Care – PPO | Admitting: Physical Therapy

## 2019-10-18 ENCOUNTER — Encounter: Payer: Self-pay | Admitting: Occupational Therapy

## 2019-10-18 ENCOUNTER — Ambulatory Visit: Payer: BC Managed Care – PPO | Admitting: Occupational Therapy

## 2019-10-18 ENCOUNTER — Other Ambulatory Visit: Payer: Self-pay

## 2019-10-18 ENCOUNTER — Encounter: Payer: Self-pay | Admitting: Physical Therapy

## 2019-10-18 DIAGNOSIS — R52 Pain, unspecified: Secondary | ICD-10-CM | POA: Diagnosis not present

## 2019-10-18 DIAGNOSIS — R278 Other lack of coordination: Secondary | ICD-10-CM

## 2019-10-18 DIAGNOSIS — R262 Difficulty in walking, not elsewhere classified: Secondary | ICD-10-CM | POA: Diagnosis not present

## 2019-10-18 DIAGNOSIS — R2681 Unsteadiness on feet: Secondary | ICD-10-CM

## 2019-10-18 DIAGNOSIS — M6281 Muscle weakness (generalized): Secondary | ICD-10-CM

## 2019-10-18 DIAGNOSIS — R208 Other disturbances of skin sensation: Secondary | ICD-10-CM | POA: Diagnosis not present

## 2019-10-18 DIAGNOSIS — D497 Neoplasm of unspecified behavior of endocrine glands and other parts of nervous system: Secondary | ICD-10-CM | POA: Diagnosis not present

## 2019-10-18 DIAGNOSIS — R293 Abnormal posture: Secondary | ICD-10-CM | POA: Diagnosis not present

## 2019-10-18 DIAGNOSIS — R2689 Other abnormalities of gait and mobility: Secondary | ICD-10-CM

## 2019-10-18 DIAGNOSIS — R41842 Visuospatial deficit: Secondary | ICD-10-CM | POA: Diagnosis not present

## 2019-10-18 NOTE — Therapy (Signed)
[facility] [address] [place], [place], [address] Phone: [phone]   Fax:  [phone]  Occupational Therapy Treatment  Patient Details  Name: Jacob Lambert MRN: [id number] Date of Birth: [birth year] Referring Provider (OT): Dr. Courtney Heys   Encounter Date: 10/18/2019   OT End of Session - 10/18/19 1235    Visit Number 3    Number of Visits 25    Date for OT Re-Evaluation 01/01/20    Authorization Time Period 30 visit limit - week 2 of 12 (10/13)    OT Start Time 1233    OT Stop Time 1315    OT Time Calculation (min) 42 min    Activity Tolerance Patient tolerated treatment well    Behavior During Therapy [facility] for tasks assessed/performed           Past Medical History:  Diagnosis Date  . Anxiety   . Clonus   . Depression   . Headache   . Pneumonia    "walking"  . Quadriplegia and quadriparesis ([place])   . Spasticity   . Spinal cord tumor   . Wheelchair bound     Past Surgical History:  Procedure Laterality Date  . LAMINECTOMY N/A 06/01/2019   Procedure: Cervical six-seven Laminoplasty, biopsy and resection of intramedullary spinal cord tumor;  Surgeon: [name], MD;  Location: [facility];  Service: Neurosurgery;  Laterality: N/A;  . NO PAST SURGERIES      There were no vitals filed for this visit.   Subjective Assessment - 10/18/19 1233    Subjective  Pt reports "nothing out of the ordinary" in pain which they report as 4/10 in left middle finger, shoulders into upper arms and down legs. Pt reports falling 2 x over the weekend "getting out of bed and attempting to give the dogs a treat".    Pertinent History PMH depression    Limitations Fall Risk.    Patient Stated Goals "I want to have as much functional independence as possible" more dexterity and typing skills for work, wear tights and wear heels    Currently in Pain? Yes    Pain Score 4     Pain Location --   multiple areas   Pain  Descriptors / Indicators Aching    Pain Onset More than a month ago    Pain Frequency Constant            Treatment: Nuts and bolts to target fine motor coordination in BUE and stereognosis with LUE. Pt with mod to max difficulty with LUE with occluded vision of task.   Small pegboard design with LUE focus of in hand manipulation with removing pegs  Issued yellow theraband HEP - see pt instructions               OT Education - 10/18/19 1252    Education Details Issued yellow theraband for strengthening SEATED - see pt instructions    Person(s) Educated Patient    Methods Explanation;Demonstration    Comprehension Verbalized understanding;Returned demonstration            OT Short Term Goals - 10/13/19 1500      OT SHORT TERM GOAL #1   Title Pt will be independent with HEP for coordination and strengthening 11/06/19    Time 4    Period Weeks    Status On-going   yellow theraputty 10/8   Target Date 11/06/19      OT SHORT TERM GOAL #2  Title Pt will increase grip strength in BUE by 5 lbs in order to increase ability to manage clothing (tights, socks, pants) and increased independence with ADLs.    Baseline RUE 33.2 lbs LUE 35.4 lbs    Time 4    Period Weeks    Status New      OT SHORT TERM GOAL #3   Title Pt will improve functional use of LUE as evidenced by performing 50 blocks or more on Box & Blocks test.    Baseline LUE 43 blocks    Time 4    Period Weeks    Status New      OT SHORT TERM GOAL #4   Title Pt will perform complex meal prep and/or housekeeping tasks with mod I and showing good safety awareness in order to increase independence with IADLs.    Time 4    Period Weeks    Status New      OT SHORT TERM GOAL #5   Title Pt will verbalize compensatory strategies for visual deficits.    Time 4    Period Weeks    Status New      OT SHORT TERM GOAL #6   Title Pt will improve 9 hole peg test score to 33 seconds or less with BUE in order to  increase fine motor coordination in order to increase independence with fasteners and jewelry, etc.    Time 4    Period Weeks    Status New      OT SHORT TERM GOAL #7   Title Pt will obtain a lightweight object from higher level cabinet with LUE, dominant hand, for increasing independence with ADLs and IADLs.    Time 4    Period Weeks    Status New      OT SHORT TERM GOAL #8   Title Pt will increase keyboarding/typing skills to completing 30 wpm in order to prep for back to work.    Time 4    Period Weeks    Status New             OT Long Term Goals - 10/10/19 1502      OT LONG TERM GOAL #1   Title Pt will be independent with updated HEP for coordination and strengthening 01/02/20    Time 12    Period Weeks    Status New    Target Date 01/02/20      OT LONG TERM GOAL #2   Title Pt will increase grip strength in BUE by 10 lbs in order to increase ability to manage clothing (tights, socks, pants) and increased independence with ADLs.    Baseline LUE 35.4 RUE 33.2    Time 12    Period Weeks    Status New      OT LONG TERM GOAL #3   Title Pt will improve functional use of LUE as evidenced by performing 60 blocks or more on Box & Blocks test.    Baseline LUE 43    Time 12    Period Weeks    Status New      OT LONG TERM GOAL #4   Title Pt will improve 9 hole peg test score to 25 seconds or less with BUE in order to increase fine motor coordination in order to increase independence with fasteners and jewelry, etc.    Baseline RUE 37.84 LUE 38.57    Time 12    Period Weeks    Status New  OT LONG TERM GOAL #5   Title Pt will obtain an object from cabinet greater than or equal to 5lbs with LUE, dominant hand, for increasing independence with ADLs and IADLs.    Time 12    Period Weeks    Status New      OT LONG TERM GOAL #6   Title Pt will increase keyboarding/typing skills to completing 50 wpm in order to prep for back to work.    Time 12    Period Weeks     Status New                 Plan - 10/18/19 1237    Clinical Impression Statement Pt had 2 falls over the weekend secondary to unsteadiness on feet. pt progressing towards goals.    OT Occupational Profile and History Detailed Assessment- Review of Records and additional review of physical, cognitive, psychosocial history related to current functional performance    Occupational performance deficits (Please refer to evaluation for details): IADL's;ADL's;Work;Social Participation;Leisure    Body Structure / Function / Physical Skills Strength;Gait;Decreased knowledge of use of DME;ADL;Balance;Dexterity;GMC;Pain;Tone;Body mechanics;UE functional use;Endurance;IADL;ROM;Vision;Coordination;Flexibility;Mobility;Sensation;FMC    Rehab Potential Good    Clinical Decision Making Limited treatment options, no task modification necessary    Comorbidities Affecting Occupational Performance: May have comorbidities impacting occupational performance    Modification or Assistance to Complete Evaluation  No modification of tasks or assist necessary to complete eval    OT Frequency 2x / week    OT Duration 12 weeks    OT Treatment/Interventions Self-care/ADL training;Moist Heat;Fluidtherapy;DME and/or AE instruction;Balance training;Therapeutic activities;Gait Training;Ultrasound;Therapeutic exercise;Scar mobilization;Visual/perceptual remediation/compensation;Passive range of motion;Functional Mobility Training;Neuromuscular education;Electrical Stimulation;Paraffin;Energy conservation;Manual Therapy;Patient/family education    Plan grip strengthening, functional balance, review HEP    Consulted and Agree with Plan of Care Patient           Patient will benefit from skilled therapeutic intervention in order to improve the following deficits and impairments:   Body Structure / Function / Physical Skills: Strength, Gait, Decreased knowledge of use of DME, ADL, Balance, Dexterity, GMC, Pain, Tone, Body  mechanics, UE functional use, Endurance, IADL, ROM, Vision, Coordination, Flexibility, Mobility, Sensation, [facility]       Visit Diagnosis: Other lack of coordination  Muscle weakness (generalized)  Other disturbances of skin sensation  Visuospatial deficit  Unsteadiness on feet    Problem List Patient Active Problem List   Diagnosis Date Noted  . Spasticity 08/18/2019  . Chronic pain syndrome 08/18/2019  . Nerve pain 08/18/2019  . Clonus 07/21/2019  . Spinal cord tumor   . Pain   . Transaminitis   . Leukocytosis   . Neurogenic bowel   . Postoperative pain   . 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 Acute incomplete quadriplegia ([facility])   . 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 Numbness 05/10/2019  . Weakness of left lower extremity 05/10/2019  . 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 Visual disturbance 05/10/2019    Zachery Conch MOT, OTR/L  10/18/2019, 2:10 PM  [facility] [address] [place], [place], [address] Phone: [phone]   Fax:  [phone]  Name: Leston Schueller MRN: [id number] Date of Birth: 04/24/1985

## 2019-10-18 NOTE — Patient Instructions (Addendum)
  COMPLETED IN SITTING  Strengthening: Resisted Flexion   Hold tubing with _Left____ arm(s) at side. Pull forward and up. Move shoulder through pain-free range of motion. Repeat __10__ times per set.  Do _1-2_ sessions per day , every other day. Repeat with RIGHT.   Strengthening: Resisted Extension   Hold tubing in _left  hand(s), arm forward. Pull arm back, elbow straight. Repeat _10___ times per set. Do _1-2___ sessions per day, every other day. Repeat with RIGHT   Resisted Horizontal Abduction: Bilateral   Sit or stand, tubing in both hands, arms out in front. Keeping arms straight, pinch shoulder blades together and stretch arms out. Repeat _10___ times per set. Do _1-2___ sessions per day, every other day.   Elbow Flexion: Resisted   With tubing held in _left _ hand(s) and other end secured under foot, curl arm up as far as possible. Repeat _10___ times per set. Do _1-2___ sessions per day, every other day. Repeat with RIGHT    Elbow Extension: Resisted   Sit in chair with resistive band secured at armrest (or hold with other hand) and __left__ elbow bent. Straighten elbow. Repeat _10___ times per set.  Do _1-2___ sessions per day, every other day. Repeat with RIGHT   Copyright  VHI. All rights reserved.

## 2019-10-19 NOTE — Therapy (Signed)
[facility] [address] [place], [place], [address] Phone: [phone]   Fax:  [phone]  Physical Therapy Treatment  Patient Details  Name: Jacob Lambert MRN: [id number] Date of Birth: Jun 03, 1985 Referring Provider (PT): Dr. Courtney Heys   Encounter Date: 10/18/2019   PT End of Session - 10/18/19 1323    Visit Number 7    Number of Visits 17    Date for PT Re-Evaluation 11/15/19    PT Start Time 6962    PT Stop Time 1400    PT Time Calculation (min) 42 min    Equipment Utilized During Treatment Gait belt    Activity Tolerance Patient tolerated treatment well;Patient limited by fatigue    Behavior During Therapy [facility] for tasks assessed/performed           Past Medical History:  Diagnosis Date  . Anxiety   . Clonus   . Depression   . Headache   . Pneumonia    "walking"  . Quadriplegia and quadriparesis ([place])   . Spasticity   . Spinal cord tumor   . Wheelchair bound     Past Surgical History:  Procedure Laterality Date  . LAMINECTOMY N/A 06/01/2019   Procedure: Cervical six-seven Laminoplasty, biopsy and resection of intramedullary spinal cord tumor;  Surgeon: [name], MD;  Location: [facility];  Service: Neurosurgery;  Laterality: N/A;  . NO PAST SURGERIES      There were no vitals filed for this visit.   Subjective Assessment - 10/18/19 1319    Subjective Pt reports two falls over the weekend. 1st one pt reports getting dizzy when walking away from bed, reached for rollator, however legs were too heavy with them landing on knees/right hip. The 2cd fall was when they were squatting down with one hand on rollator to reach and give dogs a treat. Was able to get themselve up both times. Reports some bruising, no other injuries.    Limitations Standing;Walking;House hold activities    How long can you stand comfortably? <5 min    How long can you walk comfortably? <0.25 mile    Patient Stated  Goals Progress walking    Currently in Pain? Yes    Pain Score 4     Pain Location Generalized   multiple sites   Pain Descriptors / Indicators Aching    Pain Type Acute pain;Chronic pain    Pain Onset More than a month ago    Pain Frequency Constant    Aggravating Factors  certain movements    Pain Relieving Factors CBD cream/oral droplets                  [facility] Adult PT Treatment/Exercise - 10/18/19 1324      Transfers   Transfers Sit to Stand;Stand to Sit    Sit to Stand 5: Supervision;With upper extremity assist;From bed;From chair/3-in-1    Stand to Sit 5: Supervision;With upper extremity assist;To bed;To chair/3-in-1      Ambulation/Gait   Ambulation/Gait Yes    Ambulation/Gait Assistance 5: Supervision;4: Min guard    Ambulation/Gait Assistance Details bil knee buckling with pt self correct for first half, then up to min assist for second half of gait rep.     Ambulation Distance (Feet) 500 Feet   x1   Assistive device Rollator    Gait Pattern Step-through pattern;Narrow base of support;Lateral trunk lean to right;Scissoring    Ambulation Surface Level;Unlevel;Indoor;Outdoor;Paved      Self-Care  Self-Care Other Self-Care Comments    Other Self-Care Comments  discussed fall prevention. Discussed establishing a bowel and bladder program. Pt has a consistent Bowel plan. Does not endorse urine urgency as well. Then discussed use of BSC or urinal to prevent dizziness with rushing to bathroom. Pt stated theres wasnt' room and it would be no closer that the bathroom than by a few feet. Then focused on second fall and prevention by locking the rollator and sitting to give treats to dog. Pt verbalized understanding.       Knee/Hip Exercises: Seated   Long Arc Quad AROM;Strengthening;Both;1 set;10 reps;Limitations    [employer] Limitations with red band resistance, cues for slow, controlled movements.     Marching AROM;Strengthening;Both;1 set;10 reps;Limitations     Marching Limitations red band resistance with cues for slow, controlled movements.     Hamstring Curl AROM;Strengthening;Both;1 set;10 reps;Limitations    Hamstring Limitations red band resistance with cues for slow, controlled movements      Knee/Hip Exercises: Supine   Bridges AROM;Strengthening;Both;1 set;10 reps;Limitations    Bridges Limitations 5 sec holds with assist to stabilize knees at times    Straight Leg Raises AROM;Strengthening;Both;1 set;10 reps;Limitations    Straight Leg Raises Limitations cues to keep knee straight with assist needed toward end of reps.                PT Short Term Goals - 10/13/19 1503      PT SHORT TERM GOAL #1   Title Patient will be able to ambulate 115' with cane to improve short distance ambulation within home    Baseline 115' with min/mod A with quad cane due to multi LOB and scissoring 25% (10/13/19)    Time 4    Period Weeks    Status Partially Met    Target Date 10/18/19      PT SHORT TERM GOAL #2   Title Pt will demo <12 seconds on timed up and go with RW to improve functional mobility    Baseline 14 seconds RW (eval); 13 seconds rollator (10/13/19)    Time 4    Period Weeks    Status New    Target Date 10/18/19      PT SHORT TERM GOAL #3   Title Patient will be able to go up and down a ramp and curb step to be able to negotiate community    Baseline CGA with rollator for 3' long ramp, forward and over aerobic stepper acting as curb x10 (10/13/19)    Time 4    Period Weeks    Status Achieved    Target Date 10/18/19             PT Long Term Goals - 09/20/19 1839      PT LONG TERM GOAL #1   Title Patient will demo >50/56 on berg balance scale to improve functional balance and reduce fall risk    Baseline 40/56 (eval)    Time 8    Period Weeks    Status New    Target Date 11/15/19      PT LONG TERM GOAL #2   Title Patient will be able to ambulate >500 feet with cane to improve short distance community ambulation.     Baseline only walks with RW    Time 8    Period Weeks    Status New    Target Date 11/15/19      PT LONG TERM GOAL #3   Title  Patient will demo 4+/5 strength in bil LE grossly to improve functional strength with transfers    Baseline 3+-4/5 grossly in bil LE    Time 8    Period Weeks    Status New    Target Date 11/15/19      PT LONG TERM GOAL #4   Title Patient will be able to go up and down 12 steps with use of 1-2 rails to be able to go up and down steps at home    Baseline 2 steps (with one rail) (Eval)    Time 8    Period Weeks    Status New    Target Date 11/15/19      PT LONG TERM GOAL #5   Title 6MWT goal    Baseline TBD    Time 8    Period Weeks    Status New    Target Date 11/15/19                 Plan - 10/18/19 1323    Clinical Impression Statement Today's skilled session focused on fall prevention due to pt's recent falls, gait with rollator on various surfaces to prepare for upcoming trip and on LE strengthening. Pt with shorts on today and noted to have bil varus knees in stance which could contribute to his buckling at times. The pt should benefit from continued PT to progress toward unmet goals.    Personal Factors and Comorbidities Age;Past/Current Experience;Comorbidity 2;Time since onset of injury/illness/exacerbation;Transportation    Examination-Activity Limitations Bathing;Lift;Squat;Stairs;Stand;Transfers    Examination-Participation Restrictions Cleaning;Community Activity;Driving;Laundry;Occupation;Shop;Yard Work    Product manager    Rehab Potential Fair    PT Frequency 2x / week    PT Duration 8 weeks    PT Treatment/Interventions ADLs/Self Care Home Management;Gait training;Stair training;Functional mobility training;Therapeutic activities;Therapeutic exercise;Balance training;Neuromuscular re-education;Manual techniques;Wheelchair mobility training;Patient/family education;Passive range of  motion;Energy conservation;Vestibular;Visual/perceptual remediation/compensation;Joint Manipulations    PT Next Visit Plan continue to work on LE strengthening, stepping, balance on solid surfaces progressing to compliant surfaces with decreased UE support. Progress gait away from rollator as is safe for pt. Add to HEP as indicated    PT Home Exercise Plan Access Code: [email or web address]    Consulted and Agree with Plan of Care Patient           Patient will benefit from skilled therapeutic intervention in order to improve the following deficits and impairments:  Abnormal gait, Decreased activity tolerance, Decreased balance, Decreased cognition, Decreased mobility, Decreased endurance, Decreased coordination, Decreased range of motion, Decreased strength, Dizziness, Difficulty walking, Impaired sensation, Impaired tone, Impaired UE functional use, Postural dysfunction, Pain  Visit Diagnosis: Muscle weakness (generalized)  Unsteadiness on feet  Abnormal posture  Difficulty in walking, not elsewhere classified  Other abnormalities of gait and mobility     Problem List Patient Active Problem List   Diagnosis Date Noted  . Spasticity 08/18/2019  . Chronic pain syndrome 08/18/2019  . Nerve pain 08/18/2019  . Clonus 07/21/2019  . Spinal cord tumor   . Pain   . Transaminitis   . Leukocytosis   . Neurogenic bowel   . Postoperative pain   . 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 Acute incomplete quadriplegia ([facility])   . 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 Numbness 05/10/2019  . Weakness of left lower extremity 05/10/2019  . 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 Visual disturbance 05/10/2019    Willow Ora, PTA, [facility] [address], [place], [place]  [id number] [phone] 10/19/19, 12:15 PM   Name: Jacob Lambert MRN: [id number] Date of Birth: 1985-10-15

## 2019-10-20 ENCOUNTER — Other Ambulatory Visit: Payer: Self-pay

## 2019-10-20 ENCOUNTER — Encounter: Payer: Self-pay | Admitting: Physical Therapy

## 2019-10-20 ENCOUNTER — Ambulatory Visit: Payer: BC Managed Care – PPO | Admitting: Occupational Therapy

## 2019-10-20 ENCOUNTER — Ambulatory Visit: Payer: BC Managed Care – PPO | Admitting: Physical Therapy

## 2019-10-20 ENCOUNTER — Encounter: Payer: Self-pay | Admitting: Occupational Therapy

## 2019-10-20 DIAGNOSIS — R2689 Other abnormalities of gait and mobility: Secondary | ICD-10-CM

## 2019-10-20 DIAGNOSIS — R278 Other lack of coordination: Secondary | ICD-10-CM | POA: Diagnosis not present

## 2019-10-20 DIAGNOSIS — D497 Neoplasm of unspecified behavior of endocrine glands and other parts of nervous system: Secondary | ICD-10-CM | POA: Diagnosis not present

## 2019-10-20 DIAGNOSIS — R262 Difficulty in walking, not elsewhere classified: Secondary | ICD-10-CM | POA: Diagnosis not present

## 2019-10-20 DIAGNOSIS — R293 Abnormal posture: Secondary | ICD-10-CM | POA: Diagnosis not present

## 2019-10-20 DIAGNOSIS — M6281 Muscle weakness (generalized): Secondary | ICD-10-CM

## 2019-10-20 DIAGNOSIS — R208 Other disturbances of skin sensation: Secondary | ICD-10-CM | POA: Diagnosis not present

## 2019-10-20 DIAGNOSIS — R2681 Unsteadiness on feet: Secondary | ICD-10-CM | POA: Diagnosis not present

## 2019-10-20 DIAGNOSIS — R41842 Visuospatial deficit: Secondary | ICD-10-CM | POA: Diagnosis not present

## 2019-10-20 DIAGNOSIS — R52 Pain, unspecified: Secondary | ICD-10-CM | POA: Diagnosis not present

## 2019-10-20 NOTE — Therapy (Signed)
[facility] [address] [place], [place], [address] Phone: [phone]   Fax:  [phone]  Occupational Therapy Treatment  Patient Details  Name: Jacob Lambert MRN: [id number] Date of Birth: 12-26-1985 Referring Provider (OT): Dr. Courtney Heys   Encounter Date: 10/20/2019   OT End of Session - 10/20/19 1449    Visit Number 4    Number of Visits 25    Date for OT Re-Evaluation 01/01/20    Authorization Time Period 30 visit limit - week 2 of 12 (10/13)    OT Start Time 1448    OT Stop Time 1530    OT Time Calculation (min) 42 min    Activity Tolerance Patient tolerated treatment well    Behavior During Therapy [facility] for tasks assessed/performed           Past Medical History:  Diagnosis Date  . Anxiety   . Clonus   . Depression   . Headache   . Pneumonia    "walking"  . Quadriplegia and quadriparesis ([place])   . Spasticity   . Spinal cord tumor   . Wheelchair bound     Past Surgical History:  Procedure Laterality Date  . LAMINECTOMY N/A 06/01/2019   Procedure: Cervical six-seven Laminoplasty, biopsy and resection of intramedullary spinal cord tumor;  Surgeon: [name], MD;  Location: [facility];  Service: Neurosurgery;  Laterality: N/A;  . NO PAST SURGERIES      There were no vitals filed for this visit.   Subjective Assessment - 10/20/19 1449    Subjective  Pt is going to practice propelling wheelchair this weekend. Pt reports "regular pain and strange sensation with trialing of braces" on BLE. Lingering sensation.    Pertinent History PMH depression    Limitations Fall Risk.    Patient Stated Goals "I want to have as much functional independence as possible" more dexterity and typing skills for work, wear tights and wear heels    Currently in Pain? Yes    Pain Score 5     Pain Location Generalized    Pain Orientation Left    Pain Descriptors / Indicators Aching;Burning   weird sensation in  addition to regular pain in BLE   Pain Type Acute pain;Chronic pain    Pain Onset More than a month ago                        OT Treatments/Exercises (OP) - 10/20/19 1458      Exercises   Exercises Shoulder;Work Hardening      Shoulder Exercises: Seated   Other Seated Exercises with medium unweight ball - 10 x shoulder flexion, chest press and sledgehammers. BUE    Other Seated Exercises 2nd set with 2.2 lb exercise ball      Work Hardening Exercises   UBE (Upper Arm Bike) 7 minutes level 2      Functional Reaching Activities   High Level rubber washers for shoulder endurance with placing with LUE and removing with RUE          Treatment continued: typing master with typing test. 49 wpm and 83% accuracy          OT Short Term Goals - 10/20/19 1513      OT SHORT TERM GOAL #1   Title Pt will be independent with HEP for coordination and strengthening 11/06/19    Time 4    Period Weeks    Status On-going  yellow theraputty 10/8   Target Date 11/06/19      OT SHORT TERM GOAL #2   Title Pt will increase grip strength in BUE by 5 lbs in order to increase ability to manage clothing (tights, socks, pants) and increased independence with ADLs.    Baseline RUE 33.2 lbs LUE 35.4 lbs    Time 4    Period Weeks    Status Achieved   44.3 LUE; 52 RUE     OT SHORT TERM GOAL #3   Title Pt will improve functional use of LUE as evidenced by performing 50 blocks or more on Box & Blocks test.    Baseline LUE 43 blocks    Time 4    Period Weeks    Status New      OT SHORT TERM GOAL #4   Title Pt will perform complex meal prep and/or housekeeping tasks with mod I and showing good safety awareness in order to increase independence with IADLs.    Time 4    Period Weeks    Status New      OT SHORT TERM GOAL #5   Title Pt will verbalize compensatory strategies for visual deficits.    Time 4    Period Weeks    Status New      OT SHORT TERM GOAL #6   Title Pt will  improve 9 hole peg test score to 33 seconds or less with BUE in order to increase fine motor coordination in order to increase independence with fasteners and jewelry, etc.    Time 4    Period Weeks    Status New      OT SHORT TERM GOAL #7   Title Pt will obtain a lightweight object from higher level cabinet with LUE, dominant hand, for increasing independence with ADLs and IADLs.    Time 4    Period Weeks    Status New      OT SHORT TERM GOAL #8   Title Pt will increase keyboarding/typing skills to completing 30 wpm in order to prep for back to work.    Time 4    Period Weeks    Status New             OT Long Term Goals - 10/10/19 1502      OT LONG TERM GOAL #1   Title Pt will be independent with updated HEP for coordination and strengthening 01/02/20    Time 12    Period Weeks    Status New    Target Date 01/02/20      OT LONG TERM GOAL #2   Title Pt will increase grip strength in BUE by 10 lbs in order to increase ability to manage clothing (tights, socks, pants) and increased independence with ADLs.    Baseline LUE 35.4 RUE 33.2    Time 12    Period Weeks    Status New      OT LONG TERM GOAL #3   Title Pt will improve functional use of LUE as evidenced by performing 60 blocks or more on Box & Blocks test.    Baseline LUE 43    Time 12    Period Weeks    Status New      OT LONG TERM GOAL #4   Title Pt will improve 9 hole peg test score to 25 seconds or less with BUE in order to increase fine motor coordination in order to increase independence with fasteners and jewelry,  etc.    Baseline RUE 37.84 LUE 38.57    Time 12    Period Weeks    Status New      OT LONG TERM GOAL #5   Title Pt will obtain an object from cabinet greater than or equal to 5lbs with LUE, dominant hand, for increasing independence with ADLs and IADLs.    Time 12    Period Weeks    Status New      OT LONG TERM GOAL #6   Title Pt will increase keyboarding/typing skills to completing 50  wpm in order to prep for back to work.    Time 12    Period Weeks    Status New                 Plan - 10/20/19 1448    Clinical Impression Statement Pt continues to demonstrate poor strength, coordination and endurance but continues to make progress towards goals.    OT Occupational Profile and History Detailed Assessment- Review of Records and additional review of physical, cognitive, psychosocial history related to current functional performance    Occupational performance deficits (Please refer to evaluation for details): IADL's;ADL's;Work;Social Participation;Leisure    Body Structure / Function / Physical Skills Strength;Gait;Decreased knowledge of use of DME;ADL;Balance;Dexterity;GMC;Pain;Tone;Body mechanics;UE functional use;Endurance;IADL;ROM;Vision;Coordination;Flexibility;Mobility;Sensation;FMC    Rehab Potential Good    Clinical Decision Making Limited treatment options, no task modification necessary    Comorbidities Affecting Occupational Performance: May have comorbidities impacting occupational performance    Modification or Assistance to Complete Evaluation  No modification of tasks or assist necessary to complete eval    OT Frequency 2x / week    OT Duration 12 weeks    OT Treatment/Interventions Self-care/ADL training;Moist Heat;Fluidtherapy;DME and/or AE instruction;Balance training;Therapeutic activities;Gait Training;Ultrasound;Therapeutic exercise;Scar mobilization;Visual/perceptual remediation/compensation;Passive range of motion;Functional Mobility Training;Neuromuscular education;Electrical Stimulation;Paraffin;Energy conservation;Manual Therapy;Patient/family education    Plan functional balance tasks, UEB, strengthening    Consulted and Agree with Plan of Care Patient           Patient will benefit from skilled therapeutic intervention in order to improve the following deficits and impairments:   Body Structure / Function / Physical Skills: Strength,  Gait, Decreased knowledge of use of DME, ADL, Balance, Dexterity, GMC, Pain, Tone, Body mechanics, UE functional use, Endurance, IADL, ROM, Vision, Coordination, Flexibility, Mobility, Sensation, [facility]       Visit Diagnosis: Other lack of coordination  Muscle weakness (generalized)  Other disturbances of skin sensation  Visuospatial deficit  Unsteadiness on feet    Problem List Patient Active Problem List   Diagnosis Date Noted  . Spasticity 08/18/2019  . Chronic pain syndrome 08/18/2019  . Nerve pain 08/18/2019  . Clonus 07/21/2019  . Spinal cord tumor   . Pain   . Transaminitis   . Leukocytosis   . Neurogenic bowel   . Postoperative pain   . 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 Acute incomplete quadriplegia ([facility])   . 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 Numbness 05/10/2019  . Weakness of left lower extremity 05/10/2019  . 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 Visual disturbance 05/10/2019    Zachery Conch MOT, OTR/L  10/20/2019, 3:35 PM  [facility] [address] [place], [place], [address] Phone: [phone]   Fax:  [phone]  Name: Fredric Slabach MRN: [id number] Date of Birth: 19-Aug-1985

## 2019-10-21 NOTE — Therapy (Signed)
[facility] [address] [place], [place], [address] Phone: [phone]   Fax:  [phone]  Physical Therapy Treatment  Patient Details  Name: Jacob Lambert MRN: [id number] Date of Birth: 18-Mar-1985 Referring Provider (PT): Dr. Courtney Heys   Encounter Date: 10/20/2019   PT End of Session - 10/20/19 1409    Visit Number 8    Number of Visits 17    Date for PT Re-Evaluation 11/15/19    PT Start Time 1402    PT Stop Time 1445    PT Time Calculation (min) 43 min    Equipment Utilized During Treatment Gait belt    Activity Tolerance Patient tolerated treatment well;Patient limited by fatigue    Behavior During Therapy [facility] for tasks assessed/performed           Past Medical History:  Diagnosis Date  . Anxiety   . Clonus   . Depression   . Headache   . Pneumonia    "walking"  . Quadriplegia and quadriparesis ([place])   . Spasticity   . Spinal cord tumor   . Wheelchair bound     Past Surgical History:  Procedure Laterality Date  . LAMINECTOMY N/A 06/01/2019   Procedure: Cervical six-seven Laminoplasty, biopsy and resection of intramedullary spinal cord tumor;  Surgeon: [name], MD;  Location: [facility];  Service: Neurosurgery;  Laterality: N/A;  . NO PAST SURGERIES      There were no vitals filed for this visit.   Subjective Assessment - 10/20/19 1406    Subjective No new falls. Planning to take the wheelchair out and about this weekend to work on endurance and how to manage public transportation in a wheelchair.    Limitations Standing;Walking;House hold activities    How long can you sit comfortably? no issues    How long can you stand comfortably? <5 min    How long can you walk comfortably? <0.25 mile    Patient Stated Goals Progress walking    Currently in Pain? Yes    Pain Score 5     Pain Location Generalized   multiple locations   Pain Orientation Left    Pain Descriptors / Indicators  Aching;Burning    Pain Type Acute pain;Chronic pain    Pain Onset More than a month ago    Pain Frequency Constant    Aggravating Factors  certain movements    Pain Relieving Factors CBD creame/oral droplets                  [facility] Adult PT Treatment/Exercise - 10/20/19 1410      Transfers   Transfers Sit to Stand;Stand to Sit    Sit to Stand 5: Supervision;With upper extremity assist;From bed;From chair/3-in-1    Stand to Sit 5: Supervision;With upper extremity assist;To bed;To chair/3-in-1      Ambulation/Gait   Ambulation/Gait Yes    Ambulation/Gait Assistance 5: Supervision;4: Min guard    Ambulation/Gait Assistance Details one lap around track with no braces/supports with scissoring noted with good heel strike.; Trial of ace wraps to bil knees to see if tactile feedback with mild support decreased buckling with pt able to go further outdoors before knees buckling occured this rep compared to last session. Then trialed Ottobock anterior AFO's to bil LE's for gait around track, then outdoors again with no buckling noted. Pt reporting the braces feeling different and strange with walking, however liked that his knees did not buckle. will need further trial  of different braces with possible orthotic consult with orthotist.      Ambulation Distance (Feet) 115 Feet   x2, 500 x2   Assistive device Rollator    Gait Pattern Step-through pattern;Narrow base of support;Lateral trunk lean to right;Scissoring    Ambulation Surface Level;Indoor    Ramp 5: Supervision    Ramp Details (indicate cue type and reason) with rollator with ace wraps with flexed knees with descent; then x2 with bil anterior Ottobock braces with improved LE stability noted.                PT Short Term Goals - 10/13/19 1503      PT SHORT TERM GOAL #1   Title Patient will be able to ambulate 115' with cane to improve short distance ambulation within home    Baseline 115' with min/mod A with quad cane due to  multi LOB and scissoring 25% (10/13/19)    Time 4    Period Weeks    Status Partially Met    Target Date 10/18/19      PT SHORT TERM GOAL #2   Title Pt will demo <12 seconds on timed up and go with RW to improve functional mobility    Baseline 14 seconds RW (eval); 13 seconds rollator (10/13/19)    Time 4    Period Weeks    Status New    Target Date 10/18/19      PT SHORT TERM GOAL #3   Title Patient will be able to go up and down a ramp and curb step to be able to negotiate community    Baseline CGA with rollator for 3' long ramp, forward and over aerobic stepper acting as curb x10 (10/13/19)    Time 4    Period Weeks    Status Achieved    Target Date 10/18/19             PT Long Term Goals - 09/20/19 1839      PT LONG TERM GOAL #1   Title Patient will demo >50/56 on berg balance scale to improve functional balance and reduce fall risk    Baseline 40/56 (eval)    Time 8    Period Weeks    Status New    Target Date 11/15/19      PT LONG TERM GOAL #2   Title Patient will be able to ambulate >500 feet with cane to improve short distance community ambulation.    Baseline only walks with RW    Time 8    Period Weeks    Status New    Target Date 11/15/19      PT LONG TERM GOAL #3   Title Patient will demo 4+/5 strength in bil LE grossly to improve functional strength with transfers    Baseline 3+-4/5 grossly in bil LE    Time 8    Period Weeks    Status New    Target Date 11/15/19      PT LONG TERM GOAL #4   Title Patient will be able to go up and down 12 steps with use of 1-2 rails to be able to go up and down steps at home    Baseline 2 steps (with one rail) (Eval)    Time 8    Period Weeks    Status New    Target Date 11/15/19      PT LONG TERM GOAL #5   Title 6MWT goal    Baseline TBD  Time 8    Period Weeks    Status New    Target Date 11/15/19                 Plan - 10/20/19 1409    Clinical Impression Statement Today's skilled session  focused on trial of supports/braces to prevent buckling of knees with gait with best results being with use of anterior Ottobock AFO's. The pt was not fully convinced these were what would be best. The pt would benefit from continued trial of other braces and possibly an orthotic consult. The pt is progressing toward goals and should benefit from continued PT.    Personal Factors and Comorbidities Age;Past/Current Experience;Comorbidity 2;Time since onset of injury/illness/exacerbation;Transportation    Examination-Activity Limitations Bathing;Lift;Squat;Stairs;Stand;Transfers    Examination-Participation Restrictions Cleaning;Community Activity;Driving;Laundry;Occupation;Shop;Yard Work    Product manager    Rehab Potential Fair    PT Frequency 2x / week    PT Duration 8 weeks    PT Treatment/Interventions ADLs/Self Care Home Management;Gait training;Stair training;Functional mobility training;Therapeutic activities;Therapeutic exercise;Balance training;Neuromuscular re-education;Manual techniques;Wheelchair mobility training;Patient/family education;Passive range of motion;Energy conservation;Vestibular;Visual/perceptual remediation/compensation;Joint Manipulations    PT Next Visit Plan continue to work on LE strengthening, stepping, balance on solid surfaces progressing to compliant surfaces with decreased UE support. Progress gait away from rollator as is safe for pt. Add to HEP as indicated    PT Home Exercise Plan Access Code: [email or web address]    Consulted and Agree with Plan of Care Patient           Patient will benefit from skilled therapeutic intervention in order to improve the following deficits and impairments:  Abnormal gait, Decreased activity tolerance, Decreased balance, Decreased cognition, Decreased mobility, Decreased endurance, Decreased coordination, Decreased range of motion, Decreased strength, Dizziness, Difficulty walking, Impaired  sensation, Impaired tone, Impaired UE functional use, Postural dysfunction, Pain  Visit Diagnosis: Muscle weakness (generalized)  Unsteadiness on feet  Difficulty in walking, not elsewhere classified  Other abnormalities of gait and mobility     Problem List Patient Active Problem List   Diagnosis Date Noted  . Spasticity 08/18/2019  . Chronic pain syndrome 08/18/2019  . Nerve pain 08/18/2019  . Clonus 07/21/2019  . Spinal cord tumor   . Pain   . Transaminitis   . Leukocytosis   . Neurogenic bowel   . Postoperative pain   . 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 Acute incomplete quadriplegia ([facility])   . 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 Numbness 05/10/2019  . Weakness of left lower extremity 05/10/2019  . 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 Visual disturbance 05/10/2019    Willow Ora, PTA, [facility] [address] [place], [place] [address] [phone] 10/21/19, 4:01 PM   Name: Jacob Lambert MRN: [id number] Date of Birth: September 13, 1985

## 2019-10-24 ENCOUNTER — Encounter: Payer: Self-pay | Admitting: Occupational Therapy

## 2019-10-24 ENCOUNTER — Other Ambulatory Visit: Payer: Self-pay

## 2019-10-24 ENCOUNTER — Ambulatory Visit: Payer: BC Managed Care – PPO | Admitting: Occupational Therapy

## 2019-10-24 ENCOUNTER — Ambulatory Visit: Payer: BC Managed Care – PPO

## 2019-10-24 DIAGNOSIS — R208 Other disturbances of skin sensation: Secondary | ICD-10-CM | POA: Diagnosis not present

## 2019-10-24 DIAGNOSIS — R52 Pain, unspecified: Secondary | ICD-10-CM | POA: Diagnosis not present

## 2019-10-24 DIAGNOSIS — M6281 Muscle weakness (generalized): Secondary | ICD-10-CM | POA: Diagnosis not present

## 2019-10-24 DIAGNOSIS — R293 Abnormal posture: Secondary | ICD-10-CM

## 2019-10-24 DIAGNOSIS — R278 Other lack of coordination: Secondary | ICD-10-CM | POA: Diagnosis not present

## 2019-10-24 DIAGNOSIS — R2681 Unsteadiness on feet: Secondary | ICD-10-CM

## 2019-10-24 DIAGNOSIS — D497 Neoplasm of unspecified behavior of endocrine glands and other parts of nervous system: Secondary | ICD-10-CM | POA: Diagnosis not present

## 2019-10-24 DIAGNOSIS — R2689 Other abnormalities of gait and mobility: Secondary | ICD-10-CM | POA: Diagnosis not present

## 2019-10-24 DIAGNOSIS — R41842 Visuospatial deficit: Secondary | ICD-10-CM | POA: Diagnosis not present

## 2019-10-24 DIAGNOSIS — R262 Difficulty in walking, not elsewhere classified: Secondary | ICD-10-CM

## 2019-10-24 NOTE — Therapy (Signed)
[facility] [address] [place], [place], [address] Phone: [phone]   Fax:  [phone]  Occupational Therapy Treatment  Patient Details  Name: Jacob Lambert MRN: [id number] Date of Birth: 09-15-1985 Referring Provider (OT): Dr. Courtney Heys   Encounter Date: 10/24/2019   OT End of Session - 10/24/19 1617    Visit Number 5    Number of Visits 25    Date for OT Re-Evaluation 01/01/20    Authorization Time Period 30 visit limit - week 3 of 12 (10/19)    OT Start Time 1617    OT Stop Time 1700    OT Time Calculation (min) 43 min    Activity Tolerance Patient tolerated treatment well    Behavior During Therapy [facility] for tasks assessed/performed           Past Medical History:  Diagnosis Date  . Anxiety   . Clonus   . Depression   . Headache   . Pneumonia    "walking"  . Quadriplegia and quadriparesis ([place])   . Spasticity   . Spinal cord tumor   . Wheelchair bound     Past Surgical History:  Procedure Laterality Date  . LAMINECTOMY N/A 06/01/2019   Procedure: Cervical six-seven Laminoplasty, biopsy and resection of intramedullary spinal cord tumor;  Surgeon: [name], MD;  Location: [facility];  Service: Neurosurgery;  Laterality: N/A;  . NO PAST SURGERIES      There were no vitals filed for this visit.   Subjective Assessment - 10/24/19 1618    Subjective  Pt reports he has an appt for AFO or similar type braces later in the month. Pt has low 4/10 pain in LUE and back.    Pertinent History PMH depression    Limitations Fall Risk.    Patient Stated Goals "I want to have as much functional independence as possible" more dexterity and typing skills for work, wear tights and wear heels    Currently in Pain? Yes    Pain Score 4     Pain Location Arm    Pain Orientation Left    Pain Descriptors / Indicators Aching;Burning    Pain Type Acute pain;Chronic pain    Pain Onset More than a month ago     Pain Frequency Constant                        OT Treatments/Exercises (OP) - 10/24/19 1623      Exercises   Exercises Hand      Shoulder Exercises: ROM/Strengthening   UBE (Upper Arm Bike) 10 minutes level 2      Functional Reaching Activities   High Level Reaching with BUE while standing and completing large peg board design on elevated surface. Dynamic balance and pivot with weight shifts with CGA for balance and stability. No difficulty with reaching and coordination with BUE and with copying pattern.      Fine Motor Coordination (Hand/Wrist)   Fine Motor Coordination Purdue Pegboard    Purdue Pegboard LUE - not standardized. working [facility] with LUE. remove with RUE                    OT Short Term Goals - 10/20/19 1513      OT SHORT TERM GOAL #1   Title Pt will be independent with HEP for coordination and strengthening 11/06/19    Time 4    Period Weeks  Status On-going   yellow theraputty 10/8   Target Date 11/06/19      OT SHORT TERM GOAL #2   Title Pt will increase grip strength in BUE by 5 lbs in order to increase ability to manage clothing (tights, socks, pants) and increased independence with ADLs.    Baseline RUE 33.2 lbs LUE 35.4 lbs    Time 4    Period Weeks    Status Achieved   44.3 LUE; 52 RUE     OT SHORT TERM GOAL #3   Title Pt will improve functional use of LUE as evidenced by performing 50 blocks or more on Box & Blocks test.    Baseline LUE 43 blocks    Time 4    Period Weeks    Status New      OT SHORT TERM GOAL #4   Title Pt will perform complex meal prep and/or housekeeping tasks with mod I and showing good safety awareness in order to increase independence with IADLs.    Time 4    Period Weeks    Status New      OT SHORT TERM GOAL #5   Title Pt will verbalize compensatory strategies for visual deficits.    Time 4    Period Weeks    Status New      OT SHORT TERM GOAL #6   Title Pt will improve 9 hole peg test  score to 33 seconds or less with BUE in order to increase fine motor coordination in order to increase independence with fasteners and jewelry, etc.    Time 4    Period Weeks    Status New      OT SHORT TERM GOAL #7   Title Pt will obtain a lightweight object from higher level cabinet with LUE, dominant hand, for increasing independence with ADLs and IADLs.    Time 4    Period Weeks    Status New      OT SHORT TERM GOAL #8   Title Pt will increase keyboarding/typing skills to completing 30 wpm in order to prep for back to work.    Time 4    Period Weeks    Status New             OT Long Term Goals - 10/10/19 1502      OT LONG TERM GOAL #1   Title Pt will be independent with updated HEP for coordination and strengthening 01/02/20    Time 12    Period Weeks    Status New    Target Date 01/02/20      OT LONG TERM GOAL #2   Title Pt will increase grip strength in BUE by 10 lbs in order to increase ability to manage clothing (tights, socks, pants) and increased independence with ADLs.    Baseline LUE 35.4 RUE 33.2    Time 12    Period Weeks    Status New      OT LONG TERM GOAL #3   Title Pt will improve functional use of LUE as evidenced by performing 60 blocks or more on Box & Blocks test.    Baseline LUE 43    Time 12    Period Weeks    Status New      OT LONG TERM GOAL #4   Title Pt will improve 9 hole peg test score to 25 seconds or less with BUE in order to increase fine motor coordination in order to increase independence  with fasteners and jewelry, etc.    Baseline RUE 37.84 LUE 38.57    Time 12    Period Weeks    Status New      OT LONG TERM GOAL #5   Title Pt will obtain an object from cabinet greater than or equal to 5lbs with LUE, dominant hand, for increasing independence with ADLs and IADLs.    Time 12    Period Weeks    Status New      OT LONG TERM GOAL #6   Title Pt will increase keyboarding/typing skills to completing 50 wpm in order to prep for  back to work.    Time 12    Period Weeks    Status New                 Plan - 10/24/19 1618    Clinical Impression Statement Pt demonstrated improved balance today with reaching outisde of midline and weight shifting with pivots with CGA. Pt continues to progress towards goals.    OT Occupational Profile and History Detailed Assessment- Review of Records and additional review of physical, cognitive, psychosocial history related to current functional performance    Occupational performance deficits (Please refer to evaluation for details): IADL's;ADL's;Work;Social Participation;Leisure    Body Structure / Function / Physical Skills Strength;Gait;Decreased knowledge of use of DME;ADL;Balance;Dexterity;GMC;Pain;Tone;Body mechanics;UE functional use;Endurance;IADL;ROM;Vision;Coordination;Flexibility;Mobility;Sensation;FMC    Rehab Potential Good    Clinical Decision Making Limited treatment options, no task modification necessary    Comorbidities Affecting Occupational Performance: May have comorbidities impacting occupational performance    Modification or Assistance to Complete Evaluation  No modification of tasks or assist necessary to complete eval    OT Frequency 2x / week    OT Duration 12 weeks    OT Treatment/Interventions Self-care/ADL training;Moist Heat;Fluidtherapy;DME and/or AE instruction;Balance training;Therapeutic activities;Gait Training;Ultrasound;Therapeutic exercise;Scar mobilization;Visual/perceptual remediation/compensation;Passive range of motion;Functional Mobility Training;Neuromuscular education;Electrical Stimulation;Paraffin;Energy conservation;Manual Therapy;Patient/family education    Plan functional task - cooking, cleaning etc.    Consulted and Agree with Plan of Care Patient           Patient will benefit from skilled therapeutic intervention in order to improve the following deficits and impairments:   Body Structure / Function / Physical Skills:  Strength, Gait, Decreased knowledge of use of DME, ADL, Balance, Dexterity, GMC, Pain, Tone, Body mechanics, UE functional use, Endurance, IADL, ROM, Vision, Coordination, Flexibility, Mobility, Sensation, [facility]       Visit Diagnosis: Other lack of coordination  Muscle weakness (generalized)  Other disturbances of skin sensation  Visuospatial deficit  Unsteadiness on feet  Other abnormalities of gait and mobility    Problem List Patient Active Problem List   Diagnosis Date Noted  . Spasticity 08/18/2019  . Chronic pain syndrome 08/18/2019  . Nerve pain 08/18/2019  . Clonus 07/21/2019  . Spinal cord tumor   . Pain   . Transaminitis   . Leukocytosis   . Neurogenic bowel   . Postoperative pain   . Ependymoma ([place]) 06/08/2019  . Acute incomplete quadriplegia ([facility])   . 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 Numbness 05/10/2019  . Weakness of left lower extremity 05/10/2019  . 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 Visual disturbance 05/10/2019    Zachery Conch MOT, OTR/L  10/24/2019, 5:03 PM  [facility] [address] [place], [place], [address] Phone: [phone]   Fax:  [phone]  Name: Jacob Lambert MRN: [id number] Date of Birth: 1985-02-04

## 2019-10-24 NOTE — Therapy (Signed)
[facility] [address] [place], [place], [address] Phone: [phone]   Fax:  [phone]  Physical Therapy Treatment  Patient Details  Name: Jacob Lambert MRN: [id number] Date of Birth: Jan 18, 1985 Referring Provider (PT): Dr. Courtney Heys   Encounter Date: 10/24/2019   PT End of Session - 10/24/19 1632    Visit Number 9    Number of Visits 17    Date for PT Re-Evaluation 11/15/19    PT Start Time 3953    PT Stop Time 1615    PT Time Calculation (min) 30 min    Equipment Utilized During Treatment Gait belt    Activity Tolerance Patient tolerated treatment well;Patient limited by fatigue    Behavior During Therapy [facility] for tasks assessed/performed           Past Medical History:  Diagnosis Date  . Anxiety   . Clonus   . Depression   . Headache   . Pneumonia    "walking"  . Quadriplegia and quadriparesis ([place])   . Spasticity   . Spinal cord tumor   . Wheelchair bound     Past Surgical History:  Procedure Laterality Date  . LAMINECTOMY N/A 06/01/2019   Procedure: Cervical six-seven Laminoplasty, biopsy and resection of intramedullary spinal cord tumor;  Surgeon: [name], MD;  Location: [facility];  Service: Neurosurgery;  Laterality: N/A;  . NO PAST SURGERIES      There were no vitals filed for this visit.   Subjective Assessment - 10/24/19 1630    Subjective Pt reports of 2 falls last week and one near fall this week. Near fall occured when they were bending over to give dog food while holding the walker with one hand.    Limitations Standing;Walking;House hold activities    How long can you sit comfortably? no issues    How long can you stand comfortably? <5 min    How long can you walk comfortably? <0.25 mile    Patient Stated Goals Progress walking    Currently in Pain? Yes    Pain Score 5     Pain Location Leg    Pain Orientation Left;Right    Pain Descriptors / Indicators  Tingling;Pins and needles;Numbness    Pain Type Neuropathic pain    Pain Onset More than a month ago    Pain Frequency Constant             Trial of GRAFO bil today with OTC GRAFO - gait training: 1 x 414' with rollator and SBA, pt reported improved "grounding" with walking - Gait training: 1 x 115' with st. Cane (hurry cane) and 1 x 175' with st. Cane, cues to breath deeply, relax L arm, smaller steps and keeping BOS wider to prevent corssing of legs: min A required intermittently  Standing bending over to pick up stacked cones: 2 x 6 cones R and L arm with one UE support on walker.                          PT Short Term Goals - 10/13/19 1503      PT SHORT TERM GOAL #1   Title Patient will be able to ambulate 115' with cane to improve short distance ambulation within home    Baseline 115' with min/mod A with quad cane due to multi LOB and scissoring 25% (10/13/19)    Time 4    Period Weeks  Status Partially Met    Target Date 10/18/19      PT SHORT TERM GOAL #2   Title Pt will demo <12 seconds on timed up and go with RW to improve functional mobility    Baseline 14 seconds RW (eval); 13 seconds rollator (10/13/19)    Time 4    Period Weeks    Status New    Target Date 10/18/19      PT SHORT TERM GOAL #3   Title Patient will be able to go up and down a ramp and curb step to be able to negotiate community    Baseline CGA with rollator for 3' long ramp, forward and over aerobic stepper acting as curb x10 (10/13/19)    Time 4    Period Weeks    Status Achieved    Target Date 10/18/19             PT Long Term Goals - 09/20/19 1839      PT LONG TERM GOAL #1   Title Patient will demo >50/56 on berg balance scale to improve functional balance and reduce fall risk    Baseline 40/56 (eval)    Time 8    Period Weeks    Status New    Target Date 11/15/19      PT LONG TERM GOAL #2   Title Patient will be able to ambulate >500 feet with cane to  improve short distance community ambulation.    Baseline only walks with RW    Time 8    Period Weeks    Status New    Target Date 11/15/19      PT LONG TERM GOAL #3   Title Patient will demo 4+/5 strength in bil LE grossly to improve functional strength with transfers    Baseline 3+-4/5 grossly in bil LE    Time 8    Period Weeks    Status New    Target Date 11/15/19      PT LONG TERM GOAL #4   Title Patient will be able to go up and down 12 steps with use of 1-2 rails to be able to go up and down steps at home    Baseline 2 steps (with one rail) (Eval)    Time 8    Period Weeks    Status New    Target Date 11/15/19      PT LONG TERM GOAL #5   Title 6MWT goal    Baseline TBD    Time 8    Period Weeks    Status New    Target Date 11/15/19                 Plan - 10/24/19 1631    Clinical Impression Statement Today's session was focused on trial of GRAFO and progressing ambulation with LRAD. Patient reported feeling more "grounded" using GRAFO with ambulation. Pt needs intermittent min A with ambulation with gait and st. cane.. Was able to progress ambulation distance with cane today    Personal Factors and Comorbidities Age;Past/Current Experience;Comorbidity 2;Time since onset of injury/illness/exacerbation;Transportation    Examination-Activity Limitations Bathing;Lift;Squat;Stairs;Stand;Transfers    Examination-Participation Restrictions Cleaning;Community Activity;Driving;Laundry;Occupation;Shop;Yard Work    Rehab Potential Fair    PT Frequency 2x / week    PT Duration 8 weeks    PT Treatment/Interventions ADLs/Self Care Home Management;Gait training;Stair training;Functional mobility training;Therapeutic activities;Therapeutic exercise;Balance training;Neuromuscular re-education;Manual techniques;Wheelchair mobility training;Patient/family education;Passive range of motion;Energy conservation;Vestibular;Visual/perceptual remediation/compensation;Joint Manipulations     PT  Next Visit Plan continue to work on LE strengthening, stepping, balance on solid surfaces progressing to compliant surfaces with decreased UE support. Progress gait away from rollator as is safe for pt. Add to HEP as indicated    PT Home Exercise Plan Access Code: [email or web address]           Patient will benefit from skilled therapeutic intervention in order to improve the following deficits and impairments:  Abnormal gait, Decreased activity tolerance, Decreased balance, Decreased cognition, Decreased mobility, Decreased endurance, Decreased coordination, Decreased range of motion, Decreased strength, Dizziness, Difficulty walking, Impaired sensation, Impaired tone, Impaired UE functional use, Postural dysfunction, Pain  Visit Diagnosis: Other lack of coordination  Muscle weakness (generalized)  Unsteadiness on feet  Difficulty in walking, not elsewhere classified  Other abnormalities of gait and mobility  Abnormal posture  Spinal cord tumor  Pain     Problem List Patient Active Problem List   Diagnosis Date Noted  . Spasticity 08/18/2019  . Chronic pain syndrome 08/18/2019  . Nerve pain 08/18/2019  . Clonus 07/21/2019  . Spinal cord tumor   . Pain   . Transaminitis   . Leukocytosis   . Neurogenic bowel   . Postoperative pain   . 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 Acute incomplete quadriplegia ([facility])   . 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 Numbness 05/10/2019  . Weakness of left lower extremity 05/10/2019  . 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 Visual disturbance 05/10/2019    Kerrie Pleasure, PT 10/24/2019, 4:33 PM  [facility] [address] [place], [place], [address] Phone: [phone]   Fax:  [phone]  Name: Leigh Kaeding MRN: [id number] Date of Birth: [birth year]

## 2019-10-25 ENCOUNTER — Encounter: Payer: Self-pay | Admitting: Urology

## 2019-10-25 ENCOUNTER — Ambulatory Visit
Admission: RE | Admit: 2019-10-25 | Discharge: 2019-10-25 | Disposition: A | Discharge status: Home / Self Care | Payer: BC Managed Care – PPO | Source: Ambulatory Visit | Attending: Urology | Admitting: Urology

## 2019-10-25 DIAGNOSIS — D497 Neoplasm of unspecified behavior of endocrine glands and other parts of nervous system: Secondary | ICD-10-CM

## 2019-10-25 DIAGNOSIS — C720 Malignant neoplasm of spinal cord: Secondary | ICD-10-CM

## 2019-10-25 NOTE — Progress Notes (Signed)
Patients meaningful use is complete. Informed patient of is MRI December 16 At [facility]  And that he should arrive at 230. Also reminded him of his follow-up appointment with Dr. Mickeal Skinner on 12/20.

## 2019-10-25 NOTE — Progress Notes (Signed)
  Radiation Oncology         [phone] ________________________________  Name: Jacob Lambert MRN: [id number]  Date: 09/19/2019  DOB: September 18, 1985  End of Treatment Note  Diagnosis:   34 y.o. 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     Indication for treatment:  Curative, definitive radiotherapy       Radiation treatment dates:   08/08/19 - 09/19/19  Site/dose:   The surgical resection site at C6-7 was treated to 54 Gy in 30 fractions of 1.8 Gy each.  Beams/energy:   The patient was treated with IMRT using volumetric arc therapy delivering 6 MV X-rays to clockwise and counterclockwise circumferential arcs with a 90 degree collimator offset to avoid dose scalloping.  Image guidance was performed with daily cone beam CT prior to each fraction for accurate allignment.  Immobilization was achieved with BodyFix custom mold.  Narrative: The patient tolerated radiation treatment relatively well.  They did experience some pain to the right of the surgical incision throughout treatment as well as dysphagia and modest fatigue.  The dysphagia was managed with viscous lidocaine and Carafate prn.  Plan: The patient has completed radiation treatment. The patient will return to radiation oncology clinic for routine followup in one month. I advised [name] to call or return sooner if [name] has any questions or concerns related to [name]'s recovery or treatment. ________________________________  Sheral Apley. Tammi Klippel, M.D.

## 2019-10-25 NOTE — Progress Notes (Signed)
Radiation Oncology         [phone] ________________________________  Name: Jacob Lambert MRN: [id number]  Date: 10/25/2019  DOB: [birth year]  Post Treatment Note  CC: [name], [name]  [name], MD  Diagnosis:   34 y.o. 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  Interval Since Last Radiation:  5 weeks  08/08/19 - 09/19/19: The surgical resection site at C6-7 was treated to 54Gy in 13fractions of 1.8 Gy each.  Narrative:  I spoke with the patient to conduct their routine scheduled 1 month follow up visit via telephone to spare the patient unnecessary potential exposure in the healthcare setting during the current COVID-19 pandemic.  The patient was notified in advance and gave permission to proceed with this visit format.   They tolerated radiation treatment relatively well.  They did experience some pain to the right of the surgical incision throughout treatment as well as dysphagia and modest fatigue.  The dysphagia was managed with viscous lidocaine and Carafate prn.                              On review of systems, the patient states that they are doing well in general. They report resolution of dysphagia within 2 weeks of completing radiation treatments and fatigue is gradually improving. They continue with some discomfort along the surgical incision but not progressively worsening and no paraesthesias.  They continue working with PT/OT 2x/week and this is scheduled to continue through the end of the year. They feel they are making gradual progress with therapy but continue with significant weakness in the upper and lower extremities. They deny any shortness of breath or chest pain.  "B" is excited about an upcoming trip to [place] in 01/2020. Overall, they are pleased with their progress to date.   ALLERGIES:  is allergic to cephalosporins and other.  Meds: Current Outpatient Medications  Medication Sig Dispense Refill  .  acetaminophen (TYLENOL) 325 MG tablet Take 2 tablets (650 mg total) by mouth every 4 (four) hours as needed for mild pain ((score 1 to 3) or temp > 100.5).    . baclofen (LIORESAL) 10 MG tablet Take by mouth.    . bisacodyl (DULCOLAX) 10 MG suppository Place 1 suppository (10 mg total) rectally at bedtime. 12 suppository 0  . citalopram (CELEXA) 40 MG tablet Take 1 tablet (40 mg total) by mouth daily. 30 tablet 5  . dantrolene (DANTRIUM) 100 MG capsule Take 1 capsule (100 mg total) by mouth 2 (two) times daily. 60 capsule 5  . diazepam (VALIUM) 10 MG tablet Take 1 tablet (10 mg total) by mouth every 6 (six) hours as needed. 30 tablet 0  . docusate sodium (COLACE) 100 MG capsule Take 1 capsule (100 mg total) by mouth 3 (three) times daily. 10 capsule 0  . gabapentin (NEURONTIN) 600 MG tablet Take 1 tablet (600 mg total) by mouth 3 (three) times daily. 180 tablet 5  . lidocaine (LIDODERM) 5 % Place 1 patch onto the skin daily. Remove & Discard patch within 12 hours or as directed by MD (Patient taking differently: Place 1 patch onto the skin daily. Remove & Discard patch within 12 hours or as directed by MD states he is using 4% patch) 30 patch 0  . methocarbamol (ROBAXIN) 750 MG tablet Take 1 tablet (750 mg total) by mouth every 6 (six) hours as needed for  muscle spasms. 30 tablet 0  . Multiple Vitamin (MULTIVITAMIN WITH MINERALS) TABS tablet Take 1 tablet by mouth daily.     Marland Kitchen oxyCODONE 10 MG TABS Take 1 tablet (10 mg total) by mouth every 4 (four) hours as needed for severe pain ((score 7 to 10)). 30 tablet 0  . polyethylene glycol (MIRALAX / GLYCOLAX) 17 g packet Take 17 g by mouth 3 (three) times daily. 14 each 0  . senna-docusate (SENOKOT-S) 8.6-50 MG tablet Take 2 tablets by mouth at bedtime.    . sildenafil (VIAGRA) 100 MG tablet Take 1 tablet (100 mg total) by mouth daily as needed for erectile dysfunction. 10 tablet 5  . Baclofen 5 MG TABS Take 15 mg by mouth 4 (four) times daily. (Patient not  taking: Reported on 10/25/2019) 180 tablet 5  . diclofenac Sodium (VOLTAREN) 1 % GEL Apply 2 g topically 4 (four) times daily. (Patient not taking: Reported on 10/25/2019) 2 g 1  . enoxaparin (LOVENOX) 40 MG/0.4ML injection lovenox 40 mg daily SQ until 08/01/2019 and stop (Patient not taking: Reported on 10/02/2019) 0.4 mL 1  . gabapentin (NEURONTIN) 300 MG capsule Take 2 capsules (600 mg total) by mouth 3 (three) times daily. (Patient not taking: Reported on 10/25/2019) 180 capsule 1  . lidocaine (XYLOCAINE) 2 % solution Use as directed 5 mLs in the mouth or throat as needed for mouth pain. In 10 cc syringe, mix 5 cc of water with 5 cc lidocaine.  Shake/Mix, advance syringe to posterior tongue to avoid numbing taste buds. (Patient not taking: Reported on 10/25/2019) 100 mL 5  . sucralfate (CARAFATE) 1 g tablet Take 1 tablet (1 g total) by mouth 4 (four) times daily -  with meals and at bedtime. (Patient not taking: Reported on 10/25/2019) 120 tablet 1  . topiramate (TOPAMAX) 25 MG tablet Take 1 tablet (25 mg total) by mouth at bedtime. (Patient not taking: Reported on 10/02/2019) 30 tablet 0   No current facility-administered medications for this encounter.    Physical Findings:  vitals were not taken for this visit.   /Unable to assess due to telephone follow up visit format.  Lab Findings: Lab Results  Component Value Date   WBC 8.4 06/27/2019   HGB 13.2 06/27/2019   HCT 41.8 06/27/2019   MCV 87.1 06/27/2019   PLT 284 06/27/2019     Radiographic Findings: VAS [place] LOWER EXTREMITY VENOUS (DVT)  Result Date: 10/10/2019  Lower Venous DVT Study Indications: Swelling.  Risk Factors: None identified. Comparison Study: No prior studies. Performing Technologist: Oliver Hum RVT  Examination Guidelines: A complete evaluation includes B-mode imaging, spectral Doppler, color Doppler, and power Doppler as needed of all accessible portions of each vessel. Bilateral testing is considered an integral  part of a complete examination. Limited examinations for reoccurring indications may be performed as noted. The reflux portion of the exam is performed with the patient in reverse Trendelenburg.  +-----+---------------+---------+-----------+----------+--------------+ RIGHTCompressibilityPhasicitySpontaneityPropertiesThrombus Aging +-----+---------------+---------+-----------+----------+--------------+ CFV  Full           Yes      Yes                                 +-----+---------------+---------+-----------+----------+--------------+   +---------+---------------+---------+-----------+----------+--------------+ LEFT     CompressibilityPhasicitySpontaneityPropertiesThrombus Aging +---------+---------------+---------+-----------+----------+--------------+ CFV      Full           Yes      Yes                                 +---------+---------------+---------+-----------+----------+--------------+  SFJ      Full                                                        +---------+---------------+---------+-----------+----------+--------------+ FV Prox  Full                                                        +---------+---------------+---------+-----------+----------+--------------+ FV Mid   Full                                                        +---------+---------------+---------+-----------+----------+--------------+ FV DistalFull                                                        +---------+---------------+---------+-----------+----------+--------------+ PFV      Full                                                        +---------+---------------+---------+-----------+----------+--------------+ POP      Full           Yes      Yes                                 +---------+---------------+---------+-----------+----------+--------------+ PTV      Full                                                         +---------+---------------+---------+-----------+----------+--------------+ PERO     Full                                                        +---------+---------------+---------+-----------+----------+--------------+     Summary: RIGHT: - No evidence of common femoral vein obstruction.  LEFT: - There is no evidence of deep vein thrombosis in the lower extremity.  - No cystic structure found in the popliteal fossa.  *See table(s) above for measurements and observations. Electronically signed by Servando Snare MD on 10/10/2019 at 12:17:07 PM.    Final     Impression/Plan: 1. 34 y.o. 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 They appear to be recovering well from the effects of the recent radiotherapy and are scheduled for a repeat C-Spine MRI on 12/21/19 and a follow up visit with [facility] on  12/25/19 to review results and recommendations from tumor board. We discussed that while we are happy to continue to participate in their care if clinically indicated in the future, at this point, we will plan to see them back on an as needed basis.  They will continue in routine follow up under the care and direction of Dr. Mickeal Skinner and Dr. Zada Finders. They are comfortable and in agreement with the stated plan. They know that they are welcome to call at any time with any questions or concerns related to the previous radiotherapy.     Nicholos Johns, PA-C

## 2019-10-26 ENCOUNTER — Encounter: Payer: Self-pay | Admitting: *Deleted

## 2019-10-26 NOTE — Progress Notes (Signed)
[facility] Psychosocial Distress Screening Clinical Social Work  Clinical Social Work was referred by distress screening protocol.  The patient scored a 5 on the Psychosocial Distress Thermometer which indicates moderate distress. Clinical Social Worker contacted patient by phone to assess for distress and other psychosocial needs. CSW first contact with patient. "B" shared stressors related to insurance coverage/medical bills and transportation.  The patient has completed extensive research of insurance benefits and has advocated for themselves when appealing claims, etc.  Patient also not driving and discussed the barriers for transportation in [place].  CSW validated patient's concerns, as they are typical stressors related to life adjustments.    B shared how they are using their voice to advocate for rights of others impacted by a similar diagnosis or situation. Patient acknowledged emotional impact of their experience and shared ways in which they cope, including advocacy and persistence.  ONCBCN DISTRESS SCREENING 10/25/2019  Screening Type Initial Screening  Distress experienced in past week (1-10) 5  Practical problem type Insurance;Transportation    Clinical Social Worker follow up needed: Yes.    If yes, follow up plan:  CSW emailed patient after phone visit today with information for [facility] (organization that assists survivors with legal/financial/insurance issues), Architect (free transportation to [place] appts), and [employer] ([employer] and Art gallery manager).  CSW is available as needed for consult.  Gwinda Maine, LCSW

## 2019-10-27 ENCOUNTER — Ambulatory Visit: Payer: BC Managed Care – PPO

## 2019-10-27 ENCOUNTER — Other Ambulatory Visit: Payer: Self-pay

## 2019-10-27 ENCOUNTER — Ambulatory Visit: Payer: BC Managed Care – PPO | Admitting: Occupational Therapy

## 2019-10-27 DIAGNOSIS — R278 Other lack of coordination: Secondary | ICD-10-CM | POA: Diagnosis not present

## 2019-10-27 DIAGNOSIS — M6281 Muscle weakness (generalized): Secondary | ICD-10-CM

## 2019-10-27 DIAGNOSIS — R262 Difficulty in walking, not elsewhere classified: Secondary | ICD-10-CM | POA: Diagnosis not present

## 2019-10-27 DIAGNOSIS — R2689 Other abnormalities of gait and mobility: Secondary | ICD-10-CM

## 2019-10-27 DIAGNOSIS — R52 Pain, unspecified: Secondary | ICD-10-CM

## 2019-10-27 DIAGNOSIS — R2681 Unsteadiness on feet: Secondary | ICD-10-CM

## 2019-10-27 DIAGNOSIS — R41842 Visuospatial deficit: Secondary | ICD-10-CM | POA: Diagnosis not present

## 2019-10-27 DIAGNOSIS — R293 Abnormal posture: Secondary | ICD-10-CM

## 2019-10-27 DIAGNOSIS — D497 Neoplasm of unspecified behavior of endocrine glands and other parts of nervous system: Secondary | ICD-10-CM | POA: Diagnosis not present

## 2019-10-27 DIAGNOSIS — R208 Other disturbances of skin sensation: Secondary | ICD-10-CM | POA: Diagnosis not present

## 2019-10-27 NOTE — Therapy (Signed)
[facility] [address] [place], [place], [address] Phone: [phone]   Fax:  [phone]  Physical Therapy Treatment  Patient Details  Name: Jacob Lambert MRN: [id number] Date of Birth: September 23, 1985 Referring Provider (PT): Dr. Courtney Heys   Encounter Date: 10/27/2019   PT End of Session - 10/27/19 1437    Visit Number 10    Number of Visits 17    Date for PT Re-Evaluation 11/15/19    PT Start Time 9798    PT Stop Time 1445    PT Time Calculation (min) 40 min    Equipment Utilized During Treatment Gait belt    Activity Tolerance Patient tolerated treatment well;Patient limited by fatigue    Behavior During Therapy [facility] for tasks assessed/performed           Past Medical History:  Diagnosis Date  . Anxiety   . Clonus   . Depression   . Headache   . Pneumonia    "walking"  . Quadriplegia and quadriparesis ([facility])   . Spasticity   . Spinal cord tumor   . Wheelchair bound     Past Surgical History:  Procedure Laterality Date  . LAMINECTOMY N/A 06/01/2019   Procedure: Cervical six-seven Laminoplasty, biopsy and resection of intramedullary spinal cord tumor;  Surgeon: [name], MD;  Location: [facility];  Service: Neurosurgery;  Laterality: N/A;  . NO PAST SURGERIES      There were no vitals filed for this visit.        Exercises Small Range Straight Leg Raise - 1 x daily - 7 x weekly - 2 sets - 10 reps Clamshell - 1 x daily - 7 x weekly - 2 sets - 10 reps Sidelying Hip Abduction - 1 x daily - 7 x weekly - 2 sets - 10 reps Supine Knee Extension Strengthening - 1 x daily - 7 x weekly - 2 sets - 10 reps - 5 hold Supine Bridge - 1 x daily - 7 x weekly - 2 sets - 10 reps Standing TKE with ball between wall and knee: bil HHA 10 x 5" holds                          PT Short Term Goals - 10/13/19 1503      PT SHORT TERM GOAL #1   Title Patient will be able to ambulate  115' with cane to improve short distance ambulation within home    Baseline 115' with min/mod A with quad cane due to multi LOB and scissoring 25% (10/13/19)    Time 4    Period Weeks    Status Partially Met    Target Date 10/18/19      PT SHORT TERM GOAL #2   Title Pt will demo <12 seconds on timed up and go with RW to improve functional mobility    Baseline 14 seconds RW (eval); 13 seconds rollator (10/13/19)    Time 4    Period Weeks    Status New    Target Date 10/18/19      PT SHORT TERM GOAL #3   Title Patient will be able to go up and down a ramp and curb step to be able to negotiate community    Baseline CGA with rollator for 3' long ramp, forward and over aerobic stepper acting as curb x10 (10/13/19)    Time 4    Period Weeks  Status Achieved    Target Date 10/18/19             PT Long Term Goals - 09/20/19 1839      PT LONG TERM GOAL #1   Title Patient will demo >50/56 on berg balance scale to improve functional balance and reduce fall risk    Baseline 40/56 (eval)    Time 8    Period Weeks    Status New    Target Date 11/15/19      PT LONG TERM GOAL #2   Title Patient will be able to ambulate >500 feet with cane to improve short distance community ambulation.    Baseline only walks with RW    Time 8    Period Weeks    Status New    Target Date 11/15/19      PT LONG TERM GOAL #3   Title Patient will demo 4+/5 strength in bil LE grossly to improve functional strength with transfers    Baseline 3+-4/5 grossly in bil LE    Time 8    Period Weeks    Status New    Target Date 11/15/19      PT LONG TERM GOAL #4   Title Patient will be able to go up and down 12 steps with use of 1-2 rails to be able to go up and down steps at home    Baseline 2 steps (with one rail) (Eval)    Time 8    Period Weeks    Status New    Target Date 11/15/19      PT LONG TERM GOAL #5   Title 6MWT goal    Baseline TBD    Time 8    Period Weeks    Status New    Target  Date 11/15/19                 Plan - 10/27/19 1434    Clinical Impression Statement Today's session was focused on developing HEP that patient can utilize when they are traveling and not feeling strong on their legs. There was a significant weakness with SAQ where patient was unable to perform SAQ with 2lbs on R and we had to perform it with 0lbs. This could be the weakness which is causing them to buckl knee in R knee.    Personal Factors and Comorbidities Age;Past/Current Experience;Comorbidity 2;Time since onset of injury/illness/exacerbation;Transportation    Examination-Activity Limitations Bathing;Lift;Squat;Stairs;Stand;Transfers    Examination-Participation Restrictions Cleaning;Community Activity;Driving;Laundry;Occupation;Shop;Yard Work    Rehab Potential Fair    PT Frequency 2x / week    PT Duration 8 weeks    PT Treatment/Interventions ADLs/Self Care Home Management;Gait training;Stair training;Functional mobility training;Therapeutic activities;Therapeutic exercise;Balance training;Neuromuscular re-education;Manual techniques;Wheelchair mobility training;Patient/family education;Passive range of motion;Energy conservation;Vestibular;Visual/perceptual remediation/compensation;Joint Manipulations    PT Next Visit Plan Emphasize stretngthening R knee in terminal knee extension in NWB and WB positions to improve stability.    PT Home Exercise Plan Access Code: [email or web address]           Patient will benefit from skilled therapeutic intervention in order to improve the following deficits and impairments:  Abnormal gait, Decreased activity tolerance, Decreased balance, Decreased cognition, Decreased mobility, Decreased endurance, Decreased coordination, Decreased range of motion, Decreased strength, Dizziness, Difficulty walking, Impaired sensation, Impaired tone, Impaired UE functional use, Postural dysfunction, Pain  Visit Diagnosis: Muscle weakness  (generalized)  Unsteadiness on feet  Other abnormalities of gait and mobility  Difficulty in walking, not elsewhere classified  Abnormal  posture  Spinal cord tumor  Pain     Problem List Patient Active Problem List   Diagnosis Date Noted  . Spasticity 08/18/2019  . Chronic pain syndrome 08/18/2019  . Nerve pain 08/18/2019  . Clonus 07/21/2019  . Spinal cord tumor   . Pain   . Transaminitis   . Leukocytosis   . Neurogenic bowel   . Postoperative pain   . Ependymoma ([facility]) 06/08/2019  . Acute incomplete quadriplegia ([facility])   . Spinal cord ependymoma ([facility]) 05/23/2019  . Numbness 05/10/2019  . Weakness of left lower extremity 05/10/2019  . 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 Visual disturbance 05/10/2019    Kerrie Pleasure 10/27/2019, 2:44 PM  [facility] [address] [place], [place], [address] Phone: [phone]   Fax:  [phone]  Name: Ethon Wymer MRN: [id number] Date of Birth: [birth year]

## 2019-10-27 NOTE — Patient Instructions (Signed)
Access Code: [email or web address] URL: [email or web address] Date: 10/27/2019 Prepared by: Markus Jarvis  Exercises Small Range Straight Leg Raise - 1 x daily - 7 x weekly - 2 sets - 10 reps Clamshell - 1 x daily - 7 x weekly - 2 sets - 10 reps Sidelying Hip Abduction - 1 x daily - 7 x weekly - 2 sets - 10 reps Supine Knee Extension Strengthening - 1 x daily - 7 x weekly - 2 sets - 10 reps - 5 hold Supine Bridge - 1 x daily - 7 x weekly - 2 sets - 10 reps

## 2019-10-27 NOTE — Therapy (Signed)
[facility] [address] [place], [place], [address] Phone: [phone]   Fax:  [phone]  Occupational Therapy Treatment  Patient Details  Name: Jacob Lambert MRN: [id number] Date of Birth: April 14, 1985 Referring Provider (OT): Dr. Courtney Heys   Encounter Date: 10/27/2019   OT End of Session - 10/27/19 1446    Visit Number 6    Number of Visits 25    Date for OT Re-Evaluation 01/01/20    Authorization Time Period 30 visit limit - week 3 of 12 (10/19)    OT Start Time 1445    OT Stop Time 1526    OT Time Calculation (min) 41 min    Activity Tolerance Patient tolerated treatment well    Behavior During Therapy [facility] for tasks assessed/performed           Past Medical History:  Diagnosis Date  . Anxiety   . Clonus   . Depression   . Headache   . Pneumonia    "walking"  . Quadriplegia and quadriparesis ([place])   . Spasticity   . Spinal cord tumor   . Wheelchair bound     Past Surgical History:  Procedure Laterality Date  . LAMINECTOMY N/A 06/01/2019   Procedure: Cervical six-seven Laminoplasty, biopsy and resection of intramedullary spinal cord tumor;  Surgeon: [name], MD;  Location: [facility];  Service: Neurosurgery;  Laterality: N/A;  . NO PAST SURGERIES      There were no vitals filed for this visit.   Subjective Assessment - 10/27/19 1444    Subjective  Pt reports pain in his BLE post PT session.    Pertinent History PMH depression    Limitations Fall Risk.    Patient Stated Goals "I want to have as much functional independence as possible" more dexterity and typing skills for work, wear tights and wear heels    Currently in Pain? Yes    Pain Score 6     Pain Location Leg    Pain Orientation Left;Right    Pain Descriptors / Indicators Aching;Sore;Throbbing    Pain Type Acute pain    Pain Onset More than a month ago    Pain Frequency Intermittent                         OT Treatments/Exercises (OP) - 10/27/19 1448      Cognitive Exercises   [place] pts. 43 wpm and 86% accuracy      Shoulder Exercises: Seated   Protraction Strengthening;Both;10 reps;Weights    Protraction Weight (lbs) 3.3 lbs    Flexion Strengthening;10 reps;Weights   x2 sets   Flexion Weight (lbs) 3.3 lbs    Other Seated Exercises trunk twists bilaterall x 10 reps x 2 sets 3.3 lbs seated      Hand Exercises   Other Hand Exercises hand gripper with 1 inch blocks on level 2 with BUE    Other Hand Exercises resistance clothespins in standing 1-8# with crossing and out of midline balance and weight shifting with BUE      Fine Motor Coordination (Hand/Wrist)   Fine Motor Coordination In hand manipuation training;Manipulation of small objects    In Hand Manipulation Training semi circle peg board with in grabbing 2 and translation of peg to fingertips to place in board    Manipulation of small objects semi circle peg board LUE  OT Short Term Goals - 10/27/19 1503      OT SHORT TERM GOAL #1   Title Pt will be independent with HEP for coordination and strengthening 11/06/19    Time 4    Period Weeks    Status On-going   yellow theraputty 10/8   Target Date 11/06/19      OT SHORT TERM GOAL #2   Title Pt will increase grip strength in BUE by 5 lbs in order to increase ability to manage clothing (tights, socks, pants) and increased independence with ADLs.    Baseline RUE 33.2 lbs LUE 35.4 lbs    Time 4    Period Weeks    Status Achieved   44.3 LUE; 52 RUE     OT SHORT TERM GOAL #3   Title Pt will improve functional use of LUE as evidenced by performing 50 blocks or more on Box & Blocks test.    Baseline LUE 43 blocks    Time 4    Period Weeks    Status New      OT SHORT TERM GOAL #4   Title Pt will perform complex meal prep and/or housekeeping tasks with mod I and showing good safety  awareness in order to increase independence with IADLs.    Time 4    Period Weeks    Status New      OT SHORT TERM GOAL #5   Title Pt will verbalize compensatory strategies for visual deficits.    Time 4    Period Weeks    Status New      OT SHORT TERM GOAL #6   Title Pt will improve 9 hole peg test score to 33 seconds or less with BUE in order to increase fine motor coordination in order to increase independence with fasteners and jewelry, etc.    Time 4    Period Weeks    Status New      OT SHORT TERM GOAL #7   Title Pt will obtain a lightweight object from higher level cabinet with LUE, dominant hand, for increasing independence with ADLs and IADLs.    Time 4    Period Weeks    Status New      OT SHORT TERM GOAL #8   Title Pt will increase keyboarding/typing skills to completing 30 wpm in order to prep for back to work.    Time 4    Period Weeks    Status Achieved   43 wpm            OT Long Term Goals - 10/10/19 1502      OT LONG TERM GOAL #1   Title Pt will be independent with updated HEP for coordination and strengthening 01/02/20    Time 12    Period Weeks    Status New    Target Date 01/02/20      OT LONG TERM GOAL #2   Title Pt will increase grip strength in BUE by 10 lbs in order to increase ability to manage clothing (tights, socks, pants) and increased independence with ADLs.    Baseline LUE 35.4 RUE 33.2    Time 12    Period Weeks    Status New      OT LONG TERM GOAL #3   Title Pt will improve functional use of LUE as evidenced by performing 60 blocks or more on Box & Blocks test.    Baseline LUE 43    Time 12  Period Weeks    Status New      OT LONG TERM GOAL #4   Title Pt will improve 9 hole peg test score to 25 seconds or less with BUE in order to increase fine motor coordination in order to increase independence with fasteners and jewelry, etc.    Baseline RUE 37.84 LUE 38.57    Time 12    Period Weeks    Status New      OT LONG TERM  GOAL #5   Title Pt will obtain an object from cabinet greater than or equal to 5lbs with LUE, dominant hand, for increasing independence with ADLs and IADLs.    Time 12    Period Weeks    Status New      OT LONG TERM GOAL #6   Title Pt will increase keyboarding/typing skills to completing 50 wpm in order to prep for back to work.    Time 12    Period Weeks    Status New                 Plan - 10/27/19 1446    Clinical Impression Statement Pt demonstrated improved balance today with reaching outisde of midline and weight shifting with pivots with CGA. Pt continues to progress towards goals.    OT Occupational Profile and History Detailed Assessment- Review of Records and additional review of physical, cognitive, psychosocial history related to current functional performance    Occupational performance deficits (Please refer to evaluation for details): IADL's;ADL's;Work;Social Participation;Leisure    Body Structure / Function / Physical Skills Strength;Gait;Decreased knowledge of use of DME;ADL;Balance;Dexterity;GMC;Pain;Tone;Body mechanics;UE functional use;Endurance;IADL;ROM;Vision;Coordination;Flexibility;Mobility;Sensation;FMC    Rehab Potential Good    Clinical Decision Making Limited treatment options, no task modification necessary    Comorbidities Affecting Occupational Performance: May have comorbidities impacting occupational performance    Modification or Assistance to Complete Evaluation  No modification of tasks or assist necessary to complete eval    OT Frequency 2x / week    OT Duration 12 weeks    OT Treatment/Interventions Self-care/ADL training;Moist Heat;Fluidtherapy;DME and/or AE instruction;Balance training;Therapeutic activities;Gait Training;Ultrasound;Therapeutic exercise;Scar mobilization;Visual/perceptual remediation/compensation;Passive range of motion;Functional Mobility Training;Neuromuscular education;Electrical Stimulation;Paraffin;Energy  conservation;Manual Therapy;Patient/family education    Plan functional task - cooking, cleaning etc.    Consulted and Agree with Plan of Care Patient           Patient will benefit from skilled therapeutic intervention in order to improve the following deficits and impairments:   Body Structure / Function / Physical Skills: Strength, Gait, Decreased knowledge of use of DME, ADL, Balance, Dexterity, GMC, Pain, Tone, Body mechanics, UE functional use, Endurance, IADL, ROM, Vision, Coordination, Flexibility, Mobility, Sensation, [facility]       Visit Diagnosis: Muscle weakness (generalized)  Unsteadiness on feet  Other abnormalities of gait and mobility  Other lack of coordination  Visuospatial deficit    Problem List Patient Active Problem List   Diagnosis Date Noted  . Spasticity 08/18/2019  . Chronic pain syndrome 08/18/2019  . Nerve pain 08/18/2019  . Clonus 07/21/2019  . Spinal cord tumor   . Pain   . Transaminitis   . Leukocytosis   . Neurogenic bowel   . Postoperative pain   . 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 Acute incomplete quadriplegia ([facility])   . 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 Numbness 05/10/2019  . Weakness of left lower extremity 05/10/2019  . 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 Visual disturbance 05/10/2019    Zachery Conch MOT, OTR/L  10/27/2019, 3:27 PM  [facility] [address] [place], [place], [address] Phone: [phone]   Fax:  [phone]  Name: Tamarcus Condie MRN: [id number] Date of Birth: Sep 16, 1985

## 2019-10-28 DIAGNOSIS — G8250 Quadriplegia, unspecified: Secondary | ICD-10-CM | POA: Diagnosis not present

## 2019-10-30 ENCOUNTER — Other Ambulatory Visit: Payer: Self-pay

## 2019-10-30 ENCOUNTER — Ambulatory Visit: Payer: BC Managed Care – PPO

## 2019-10-30 ENCOUNTER — Ambulatory Visit: Payer: BC Managed Care – PPO | Admitting: Occupational Therapy

## 2019-10-30 DIAGNOSIS — R262 Difficulty in walking, not elsewhere classified: Secondary | ICD-10-CM | POA: Diagnosis not present

## 2019-10-30 DIAGNOSIS — R2689 Other abnormalities of gait and mobility: Secondary | ICD-10-CM | POA: Diagnosis not present

## 2019-10-30 DIAGNOSIS — R278 Other lack of coordination: Secondary | ICD-10-CM

## 2019-10-30 DIAGNOSIS — R293 Abnormal posture: Secondary | ICD-10-CM | POA: Diagnosis not present

## 2019-10-30 DIAGNOSIS — R41842 Visuospatial deficit: Secondary | ICD-10-CM

## 2019-10-30 DIAGNOSIS — R52 Pain, unspecified: Secondary | ICD-10-CM | POA: Diagnosis not present

## 2019-10-30 DIAGNOSIS — M6281 Muscle weakness (generalized): Secondary | ICD-10-CM

## 2019-10-30 DIAGNOSIS — D497 Neoplasm of unspecified behavior of endocrine glands and other parts of nervous system: Secondary | ICD-10-CM | POA: Diagnosis not present

## 2019-10-30 DIAGNOSIS — R208 Other disturbances of skin sensation: Secondary | ICD-10-CM

## 2019-10-30 DIAGNOSIS — R2681 Unsteadiness on feet: Secondary | ICD-10-CM

## 2019-10-30 NOTE — Therapy (Signed)
[facility] [address] [place], [place], [address] Phone: [phone]   Fax:  [phone]  Occupational Therapy Treatment  Patient Details  Name: Jacob Lambert MRN: [id number] Date of Birth: 1985/11/09 Referring Provider (OT): Dr. Courtney Heys   Encounter Date: 10/30/2019   OT End of Session - 10/30/19 1536    Visit Number 7    Number of Visits 25    Date for OT Re-Evaluation 01/01/20    Authorization Time Period 30 visit limit - week 4 of 12 (10/25)    OT Start Time 1535    OT Stop Time 1616    OT Time Calculation (min) 41 min    Activity Tolerance Patient tolerated treatment well    Behavior During Therapy [facility] for tasks assessed/performed           Past Medical History:  Diagnosis Date  . Anxiety   . Clonus   . Depression   . Headache   . Pneumonia    "walking"  . Quadriplegia and quadriparesis ([place])   . Spasticity   . Spinal cord tumor   . Wheelchair bound     Past Surgical History:  Procedure Laterality Date  . LAMINECTOMY N/A 06/01/2019   Procedure: Cervical six-seven Laminoplasty, biopsy and resection of intramedullary spinal cord tumor;  Surgeon: [name], MD;  Location: [facility];  Service: Neurosurgery;  Laterality: N/A;  . NO PAST SURGERIES      There were no vitals filed for this visit.   Subjective Assessment - 10/30/19 1536    Subjective  Pt reports not cooking as much as he used to. Pt reports "regular pain"    Pertinent History PMH depression    Limitations Fall Risk.    Patient Stated Goals "I want to have as much functional independence as possible" more dexterity and typing skills for work, wear tights and wear heels    Currently in Pain? Yes    Pain Score 4     Pain Location Hand    Pain Orientation Left    Pain Descriptors / Indicators Aching;Stabbing    Pain Type Acute pain    Pain Onset More than a month ago    Pain Frequency Constant                         OT Treatments/Exercises (OP) - 10/30/19 1607      ADLs   Cooking Pt completed scrambling eggs with supervisoin and mod cues for safety. Pt requiring close supervision for poor RW safety and concern for LOB in kitchen.    Home Maintenance Pt reports doing laundry in home. Pt is sitting on rollator for removing clothes from dryer for safety as they are unable to bend down pull them out for concern of LOB    ADL Comments safety awareness poor with kitchen mobility      Fine Motor Coordination (Hand/Wrist)   Fine Motor Coordination Manipulation of small objects    Manipulation of small objects golf tee and small beads with BUE with increased time and no drops                    OT Short Term Goals - 10/27/19 1503      OT SHORT TERM GOAL #1   Title Pt will be independent with HEP for coordination and strengthening 11/06/19    Time 4    Period Weeks    Status On-going  yellow theraputty 10/8   Target Date 11/06/19      OT SHORT TERM GOAL #2   Title Pt will increase grip strength in BUE by 5 lbs in order to increase ability to manage clothing (tights, socks, pants) and increased independence with ADLs.    Baseline RUE 33.2 lbs LUE 35.4 lbs    Time 4    Period Weeks    Status Achieved   44.3 LUE; 52 RUE     OT SHORT TERM GOAL #3   Title Pt will improve functional use of LUE as evidenced by performing 50 blocks or more on Box & Blocks test.    Baseline LUE 43 blocks    Time 4    Period Weeks    Status New      OT SHORT TERM GOAL #4   Title Pt will perform complex meal prep and/or housekeeping tasks with mod I and showing good safety awareness in order to increase independence with IADLs.    Time 4    Period Weeks    Status New      OT SHORT TERM GOAL #5   Title Pt will verbalize compensatory strategies for visual deficits.    Time 4    Period Weeks    Status New      OT SHORT TERM GOAL #6   Title Pt will improve 9 hole peg test score to  33 seconds or less with BUE in order to increase fine motor coordination in order to increase independence with fasteners and jewelry, etc.    Time 4    Period Weeks    Status New      OT SHORT TERM GOAL #7   Title Pt will obtain a lightweight object from higher level cabinet with LUE, dominant hand, for increasing independence with ADLs and IADLs.    Time 4    Period Weeks    Status New      OT SHORT TERM GOAL #8   Title Pt will increase keyboarding/typing skills to completing 30 wpm in order to prep for back to work.    Time 4    Period Weeks    Status Achieved   43 wpm            OT Long Term Goals - 10/10/19 1502      OT LONG TERM GOAL #1   Title Pt will be independent with updated HEP for coordination and strengthening 01/02/20    Time 12    Period Weeks    Status New    Target Date 01/02/20      OT LONG TERM GOAL #2   Title Pt will increase grip strength in BUE by 10 lbs in order to increase ability to manage clothing (tights, socks, pants) and increased independence with ADLs.    Baseline LUE 35.4 RUE 33.2    Time 12    Period Weeks    Status New      OT LONG TERM GOAL #3   Title Pt will improve functional use of LUE as evidenced by performing 60 blocks or more on Box & Blocks test.    Baseline LUE 43    Time 12    Period Weeks    Status New      OT LONG TERM GOAL #4   Title Pt will improve 9 hole peg test score to 25 seconds or less with BUE in order to increase fine motor coordination in order to increase independence with  fasteners and jewelry, etc.    Baseline RUE 37.84 LUE 38.57    Time 12    Period Weeks    Status New      OT LONG TERM GOAL #5   Title Pt will obtain an object from cabinet greater than or equal to 5lbs with LUE, dominant hand, for increasing independence with ADLs and IADLs.    Time 12    Period Weeks    Status New      OT LONG TERM GOAL #6   Title Pt will increase keyboarding/typing skills to completing 50 wpm in order to prep  for back to work.    Time 12    Period Weeks    Status New                 Plan - 10/30/19 1805    Clinical Impression Statement Pt demonstrtaed ability to complete cooking activity requiring close supervision for balance and stability. Pt continuing to progress towards goals.    OT Occupational Profile and History Detailed Assessment- Review of Records and additional review of physical, cognitive, psychosocial history related to current functional performance    Occupational performance deficits (Please refer to evaluation for details): IADL's;ADL's;Work;Social Participation;Leisure    Body Structure / Function / Physical Skills Strength;Gait;Decreased knowledge of use of DME;ADL;Balance;Dexterity;GMC;Pain;Tone;Body mechanics;UE functional use;Endurance;IADL;ROM;Vision;Coordination;Flexibility;Mobility;Sensation;FMC    Rehab Potential Good    Clinical Decision Making Limited treatment options, no task modification necessary    Comorbidities Affecting Occupational Performance: May have comorbidities impacting occupational performance    Modification or Assistance to Complete Evaluation  No modification of tasks or assist necessary to complete eval    OT Frequency 2x / week    OT Duration 12 weeks    OT Treatment/Interventions Self-care/ADL training;Moist Heat;Fluidtherapy;DME and/or AE instruction;Balance training;Therapeutic activities;Gait Training;Ultrasound;Therapeutic exercise;Scar mobilization;Visual/perceptual remediation/compensation;Passive range of motion;Functional Mobility Training;Neuromuscular education;Electrical Stimulation;Paraffin;Energy conservation;Manual Therapy;Patient/family education    Plan dynamic standing balance    Consulted and Agree with Plan of Care Patient           Patient will benefit from skilled therapeutic intervention in order to improve the following deficits and impairments:   Body Structure / Function / Physical Skills: Strength, Gait,  Decreased knowledge of use of DME, ADL, Balance, Dexterity, GMC, Pain, Tone, Body mechanics, UE functional use, Endurance, IADL, ROM, Vision, Coordination, Flexibility, Mobility, Sensation, [facility]       Visit Diagnosis: Muscle weakness (generalized)  Unsteadiness on feet  Other abnormalities of gait and mobility  Other lack of coordination  Visuospatial deficit  Other disturbances of skin sensation    Problem List Patient Active Problem List   Diagnosis Date Noted  . Spasticity 08/18/2019  . Chronic pain syndrome 08/18/2019  . Nerve pain 08/18/2019  . Clonus 07/21/2019  . Spinal cord tumor   . Pain   . Transaminitis   . Leukocytosis   . Neurogenic bowel   . Postoperative pain   . 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 Acute incomplete quadriplegia ([facility])   . 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 Numbness 05/10/2019  . Weakness of left lower extremity 05/10/2019  . 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 Visual disturbance 05/10/2019    Zachery Conch MOT, OTR/L  10/30/2019, 6:08 PM  [facility] [address] [place], [place], [address] Phone: [phone]   Fax:  [phone]  Name: Jacob Lambert MRN: [id number] Date of Birth: [birth year]

## 2019-10-30 NOTE — Therapy (Signed)
[facility] [address] [place], [place], [address] Phone: [phone]   Fax:  [phone]  Physical Therapy Treatment  Patient Details  Name: Jacob Lambert MRN: [id number] Date of Birth: [birth year] Referring Provider (PT): Dr. Courtney Heys   Encounter Date: 10/30/2019   PT End of Session - 10/30/19 1658    Visit Number 11    Number of Visits 17    Date for PT Re-Evaluation 11/15/19    PT Start Time 5465    PT Stop Time 1700    PT Time Calculation (min) 45 min    Equipment Utilized During Treatment Gait belt    Activity Tolerance Patient tolerated treatment well;Patient limited by fatigue    Behavior During Therapy [facility] for tasks assessed/performed           Past Medical History:  Diagnosis Date  . Anxiety   . Clonus   . Depression   . Headache   . Pneumonia    "walking"  . Quadriplegia and quadriparesis ([place])   . Spasticity   . Spinal cord tumor   . Wheelchair bound     Past Surgical History:  Procedure Laterality Date  . LAMINECTOMY N/A 06/01/2019   Procedure: Cervical six-seven Laminoplasty, biopsy and resection of intramedullary spinal cord tumor;  Surgeon: [name], MD;  Location: [facility];  Service: Neurosurgery;  Laterality: N/A;  . NO PAST SURGERIES      There were no vitals filed for this visit.   Subjective Assessment - 10/30/19 1623    Subjective Pt reports they had minor LOB this morning while their animals threw off his balance while they were practicing loading suitcase.    Limitations Standing;Walking;House hold activities    How long can you sit comfortably? no issues    How long can you stand comfortably? <5 min    How long can you walk comfortably? <0.25 mile    Patient Stated Goals Progress walking    Pain Onset More than a month ago                With bil GRAFO on for the treatment today. Gait training: 1 x 437' with st. Cane with CGA and occasional min A  due to unsteadiness on feet. Pt had decreased R hip flexion with swing phase compared to L hip flexion during swing phase, pt had uneven step legnth and decreased eccentric control during R heel strike, pt was reporting R hand/wrist pain with prolonged walking as they got tired and used UE for support during ambulation. Practiced changing cane from R to L hand to prevent excessive fatigue in hands  - Walking fwd/bwd: 3 x 20 feet with cane and CGA - Walking laterally: 3 x 10 feet with cane and CGA   Ramp and curb: 3x with straight cane: CGA/min A, practiced going fwd and sideways. Pt needed cues to take smaller steps, also cues to use 3 point gait pattern to make sure he has 2 points of contact on ground at all times when going up and down ramp   Standing TKE with ball between posterior knee and wall: 10 x 5" R and L                      PT Short Term Goals - 10/13/19 1503      PT SHORT TERM GOAL #1   Title Patient will be able to ambulate 115' with cane to improve short distance  ambulation within home    Baseline 115' with min/mod A with quad cane due to multi LOB and scissoring 25% (10/13/19)    Time 4    Period Weeks    Status Partially Met    Target Date 10/18/19      PT SHORT TERM GOAL #2   Title Pt will demo <12 seconds on timed up and go with RW to improve functional mobility    Baseline 14 seconds RW (eval); 13 seconds rollator (10/13/19)    Time 4    Period Weeks    Status New    Target Date 10/18/19      PT SHORT TERM GOAL #3   Title Patient will be able to go up and down a ramp and curb step to be able to negotiate community    Baseline CGA with rollator for 3' long ramp, forward and over aerobic stepper acting as curb x10 (10/13/19)    Time 4    Period Weeks    Status Achieved    Target Date 10/18/19             PT Long Term Goals - 09/20/19 1839      PT LONG TERM GOAL #1   Title Patient will demo >50/56 on berg balance scale to improve  functional balance and reduce fall risk    Baseline 40/56 (eval)    Time 8    Period Weeks    Status New    Target Date 11/15/19      PT LONG TERM GOAL #2   Title Patient will be able to ambulate >500 feet with cane to improve short distance community ambulation.    Baseline only walks with RW    Time 8    Period Weeks    Status New    Target Date 11/15/19      PT LONG TERM GOAL #3   Title Patient will demo 4+/5 strength in bil LE grossly to improve functional strength with transfers    Baseline 3+-4/5 grossly in bil LE    Time 8    Period Weeks    Status New    Target Date 11/15/19      PT LONG TERM GOAL #4   Title Patient will be able to go up and down 12 steps with use of 1-2 rails to be able to go up and down steps at home    Baseline 2 steps (with one rail) (Eval)    Time 8    Period Weeks    Status New    Target Date 11/15/19      PT LONG TERM GOAL #5   Title goal    Baseline TBD    Time 8    Period Weeks    Status New    Target Date 11/15/19                 Plan - 10/30/19 1642    Clinical Impression Statement Today's session was focused on progressiong of gait with LRAD with GRAFO bil. Patient reported that GRAFO's were helping him from not buckling knees. Pt reported fatigue in R knee quicker compared to L knee and needed resting breaks throughout the sessions.    Personal Factors and Comorbidities Age;Past/Current Experience;Comorbidity 2;Time since onset of injury/illness/exacerbation;Transportation    Examination-Activity Limitations Bathing;Lift;Squat;Stairs;Stand;Transfers    Examination-Participation Restrictions Cleaning;Community Activity;Driving;Laundry;Occupation;Shop;Yard Work    Psychologist, educational    PT Frequency 2x / week    PT Duration  8 weeks    PT Treatment/Interventions ADLs/Self Care Home Management;Gait training;Stair training;Functional mobility training;Therapeutic activities;Therapeutic exercise;Balance  training;Neuromuscular re-education;Manual techniques;Wheelchair mobility training;Patient/family education;Passive range of motion;Energy conservation;Vestibular;Visual/perceptual remediation/compensation;Joint Manipulations    PT Next Visit Plan Emphasize stretngthening R knee in terminal knee extension in NWB and WB positions to improve stability.    PT Home Exercise Plan Access Code: [email or web address]           Patient will benefit from skilled therapeutic intervention in order to improve the following deficits and impairments:  Abnormal gait, Decreased activity tolerance, Decreased balance, Decreased cognition, Decreased mobility, Decreased endurance, Decreased coordination, Decreased range of motion, Decreased strength, Dizziness, Difficulty walking, Impaired sensation, Impaired tone, Impaired UE functional use, Postural dysfunction, Pain  Visit Diagnosis: Unsteadiness on feet  Muscle weakness (generalized)  Other abnormalities of gait and mobility  Difficulty in walking, not elsewhere classified     Problem List Patient Active Problem List   Diagnosis Date Noted  . Spasticity 08/18/2019  . Chronic pain syndrome 08/18/2019  . Nerve pain 08/18/2019  . Clonus 07/21/2019  . Spinal cord tumor   . Pain   . Transaminitis   . Leukocytosis   . Neurogenic bowel   . Postoperative pain   . 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 Acute incomplete quadriplegia ([facility])   . 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 Numbness 05/10/2019  . Weakness of left lower extremity 05/10/2019  . 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 Visual disturbance 05/10/2019    Kerrie Pleasure 10/30/2019, 4:59 PM  [facility] [address] [place], [place], [address] Phone: [phone]   Fax:  [phone]  Name: Jacob Lambert MRN: [id number] Date of Birth: December 09, 1985

## 2019-11-03 ENCOUNTER — Ambulatory Visit: Payer: BC Managed Care – PPO

## 2019-11-03 ENCOUNTER — Other Ambulatory Visit: Payer: Self-pay

## 2019-11-03 ENCOUNTER — Ambulatory Visit: Payer: BC Managed Care – PPO | Admitting: Occupational Therapy

## 2019-11-03 ENCOUNTER — Encounter: Payer: Self-pay | Admitting: Occupational Therapy

## 2019-11-03 DIAGNOSIS — R262 Difficulty in walking, not elsewhere classified: Secondary | ICD-10-CM

## 2019-11-03 DIAGNOSIS — R293 Abnormal posture: Secondary | ICD-10-CM | POA: Diagnosis not present

## 2019-11-03 DIAGNOSIS — R2681 Unsteadiness on feet: Secondary | ICD-10-CM

## 2019-11-03 DIAGNOSIS — R278 Other lack of coordination: Secondary | ICD-10-CM | POA: Diagnosis not present

## 2019-11-03 DIAGNOSIS — M6281 Muscle weakness (generalized): Secondary | ICD-10-CM | POA: Diagnosis not present

## 2019-11-03 DIAGNOSIS — R2689 Other abnormalities of gait and mobility: Secondary | ICD-10-CM | POA: Diagnosis not present

## 2019-11-03 DIAGNOSIS — D497 Neoplasm of unspecified behavior of endocrine glands and other parts of nervous system: Secondary | ICD-10-CM | POA: Diagnosis not present

## 2019-11-03 DIAGNOSIS — R41842 Visuospatial deficit: Secondary | ICD-10-CM | POA: Diagnosis not present

## 2019-11-03 DIAGNOSIS — R52 Pain, unspecified: Secondary | ICD-10-CM | POA: Diagnosis not present

## 2019-11-03 DIAGNOSIS — R208 Other disturbances of skin sensation: Secondary | ICD-10-CM

## 2019-11-03 NOTE — Therapy (Signed)
[facility] [address] [place], [place], [address] Phone: [phone]   Fax:  [phone]  Occupational Therapy Treatment  Patient Details  Name: Jacob Lambert MRN: [id number] Date of Birth: 1985-03-31 Referring Provider (OT): Dr. Courtney Heys   Encounter Date: 11/03/2019   OT End of Session - 11/03/19 1357    Visit Number 8    Number of Visits 25    Date for OT Re-Evaluation 01/01/20    Authorization Time Period 30 visit limit - week 4 of 12 (10/25)    OT Start Time 1358    OT Stop Time 1440    OT Time Calculation (min) 42 min    Activity Tolerance Patient tolerated treatment well    Behavior During Therapy [facility] for tasks assessed/performed           Past Medical History:  Diagnosis Date  . Anxiety   . Clonus   . Depression   . Headache   . Pneumonia    "walking"  . Quadriplegia and quadriparesis ([place])   . Spasticity   . Spinal cord tumor   . Wheelchair bound     Past Surgical History:  Procedure Laterality Date  . LAMINECTOMY N/A 06/01/2019   Procedure: Cervical six-seven Laminoplasty, biopsy and resection of intramedullary spinal cord tumor;  Surgeon: [name], MD;  Location: [facility];  Service: Neurosurgery;  Laterality: N/A;  . NO PAST SURGERIES      There were no vitals filed for this visit.   Subjective Assessment - 11/03/19 1358    Subjective  Pt reports being tired but denies any pain. Pt reports "regular 3-4/10". Pt reports a fall with tripping over clothing.    Pertinent History PMH depression    Limitations Fall Risk.    Patient Stated Goals "I want to have as much functional independence as possible" more dexterity and typing skills for work, wear tights and wear heels    Currently in Pain? Yes    Pain Score 4     Pain Location Leg    Pain Orientation Left;Right    Pain Descriptors / Indicators Pins and needles;Pressure    Pain Type Acute pain    Pain Onset More than a  month ago    Pain Frequency Intermittent    Aggravating Factors  post PT and AFO donning                        OT Treatments/Exercises (OP) - 11/03/19 1401      Hand Exercises   Other Hand Exercises hand gripper with level 2 with BUE       Neurological Re-education Exercises   Weight Shifting Outside Base of Support    Weight-Shifting Exercises - Outside [place] of Support dynamic balance with standing and pivoting outside of midline while completing small peg board design on board to right. Pt with SBA for stability and demonstrated increased dynamic balance and base of support      Sensation Exercises   Sensory Retraining pt reports pins and needles and pressure and pulsating in BLE post PT and fitting for AFOs.      Functional Reaching Activities   High Level functional reaching with BUE for increase endurance and strengthening on elevated surface for increased reach overhead      Fine Motor Coordination (Hand/Wrist)   Fine Motor Coordination Manipulation of small objects    In Hand Manipulation Training RUE 30.60 LUE 27.09  OT Short Term Goals - 11/03/19 1418      OT SHORT TERM GOAL #1   Title Pt will be independent with HEP for coordination and strengthening 11/06/19    Time 4    Period Weeks    Status On-going   yellow theraputty 10/8   Target Date 11/06/19      OT SHORT TERM GOAL #2   Title Pt will increase grip strength in BUE by 5 lbs in order to increase ability to manage clothing (tights, socks, pants) and increased independence with ADLs.    Baseline RUE 33.2 lbs LUE 35.4 lbs    Time 4    Period Weeks    Status Achieved   44.3 LUE; 52 RUE     OT SHORT TERM GOAL #3   Title Pt will improve functional use of LUE as evidenced by performing 50 blocks or more on Box & Blocks test.    Baseline LUE 43 blocks    Time 4    Period Weeks    Status New      OT SHORT TERM GOAL #4   Title Pt will perform complex meal prep and/or  housekeeping tasks with mod I and showing good safety awareness in order to increase independence with IADLs.    Time 4    Period Weeks    Status New      OT SHORT TERM GOAL #5   Title Pt will verbalize compensatory strategies for visual deficits.    Time 4    Period Weeks    Status New      OT SHORT TERM GOAL #6   Title Pt will improve 9 hole peg test score to 33 seconds or less with BUE in order to increase fine motor coordination in order to increase independence with fasteners and jewelry, etc.    Time 4    Period Weeks    Status Achieved   LUE 27.09 seconds RUE 30.06 seconds     OT SHORT TERM GOAL #7   Title Pt will obtain a lightweight object from higher level cabinet with LUE, dominant hand, for increasing independence with ADLs and IADLs.    Time 4    Period Weeks    Status New      OT SHORT TERM GOAL #8   Title Pt will increase keyboarding/typing skills to completing 30 wpm in order to prep for back to work.    Time 4    Period Weeks    Status Achieved   43 wpm            OT Long Term Goals - 10/10/19 1502      OT LONG TERM GOAL #1   Title Pt will be independent with updated HEP for coordination and strengthening 01/02/20    Time 12    Period Weeks    Status New    Target Date 01/02/20      OT LONG TERM GOAL #2   Title Pt will increase grip strength in BUE by 10 lbs in order to increase ability to manage clothing (tights, socks, pants) and increased independence with ADLs.    Baseline LUE 35.4 RUE 33.2    Time 12    Period Weeks    Status New      OT LONG TERM GOAL #3   Title Pt will improve functional use of LUE as evidenced by performing 60 blocks or more on Box & Blocks test.    Baseline LUE 43  Time 12    Period Weeks    Status New      OT LONG TERM GOAL #4   Title Pt will improve 9 hole peg test score to 25 seconds or less with BUE in order to increase fine motor coordination in order to increase independence with fasteners and jewelry, etc.      Baseline RUE 37.84 LUE 38.57    Time 12    Period Weeks    Status New      OT LONG TERM GOAL #5   Title Pt will obtain an object from cabinet greater than or equal to 5lbs with LUE, dominant hand, for increasing independence with ADLs and IADLs.    Time 12    Period Weeks    Status New      OT LONG TERM GOAL #6   Title Pt will increase keyboarding/typing skills to completing 50 wpm in order to prep for back to work.    Time 12    Period Weeks    Status New                 Plan - 11/03/19 1358    Clinical Impression Statement Pt demonstrates increase fine motor and grip strengthening in BUE increasing skills and independence. Continuing to progress towards goals.    OT Occupational Profile and History Detailed Assessment- Review of Records and additional review of physical, cognitive, psychosocial history related to current functional performance    Occupational performance deficits (Please refer to evaluation for details): IADL's;ADL's;Work;Social Participation;Leisure    Body Structure / Function / Physical Skills Strength;Gait;Decreased knowledge of use of DME;ADL;Balance;Dexterity;GMC;Pain;Tone;Body mechanics;UE functional use;Endurance;IADL;ROM;Vision;Coordination;Flexibility;Mobility;Sensation;FMC    Rehab Potential Good    Clinical Decision Making Limited treatment options, no task modification necessary    Comorbidities Affecting Occupational Performance: May have comorbidities impacting occupational performance    Modification or Assistance to Complete Evaluation  No modification of tasks or assist necessary to complete eval    OT Frequency 2x / week    OT Duration 12 weeks    OT Treatment/Interventions Self-care/ADL training;Moist Heat;Fluidtherapy;DME and/or AE instruction;Balance training;Therapeutic activities;Gait Training;Ultrasound;Therapeutic exercise;[place]ar mobilization;Visual/perceptual remediation/compensation;Passive range of motion;Functional Mobility  Training;Neuromuscular education;Electrical Stimulation;Paraffin;Energy conservation;Manual Therapy;Patient/family education    Plan dynamic standing balance, functional ADLs    Consulted and Agree with Plan of Care Patient           Patient will benefit from skilled therapeutic intervention in order to improve the following deficits and impairments:   Body Structure / Function / Physical Skills: Strength, Gait, Decreased knowledge of use of DME, ADL, Balance, Dexterity, GMC, Pain, Tone, Body mechanics, UE functional use, Endurance, IADL, ROM, Vision, Coordination, Flexibility, Mobility, Sensation, [facility]       Visit Diagnosis: Muscle weakness (generalized)  Unsteadiness on feet  Other abnormalities of gait and mobility  Other lack of coordination  Other disturbances of skin sensation  Visuospatial deficit    Problem List Patient Active Problem List   Diagnosis Date Noted  . Spasticity 08/18/2019  . Chronic pain syndrome 08/18/2019  . Nerve pain 08/18/2019  . Clonus 07/21/2019  . Spinal cord tumor   . Pain   . Transaminitis   . Leukocytosis   . Neurogenic bowel   . Postoperative pain   . 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 Acute incomplete quadriplegia ([facility])   . 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 Numbness 05/10/2019  . Weakness of left lower extremity 05/10/2019  . 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 Visual disturbance 05/10/2019  Zachery Conch MOT, OTR/L  11/03/2019, 3:02 PM  [facility] [address] [place], [place], [address] Phone: [phone]   Fax:  [phone]  Name: Cleveland Yarbro MRN: [id number] Date of Birth: 1985-09-07

## 2019-11-03 NOTE — Therapy (Signed)
[facility] [address] [place], [place], [address] Phone: [phone]   Fax:  [phone]  Physical Therapy Treatment  Patient Details  Name: Jacob Lambert MRN: [id number] Date of Birth: 06-Dec-1985 Referring Provider (PT): Dr. Courtney Heys   Encounter Date: 11/03/2019    Past Medical History:  Diagnosis Date  . Anxiety   . Clonus   . Depression   . Headache   . Pneumonia    "walking"  . Quadriplegia and quadriparesis ([name])   . Spasticity   . Spinal cord tumor   . Wheelchair bound     Past Surgical History:  Procedure Laterality Date  . LAMINECTOMY N/A 06/01/2019   Procedure: Cervical six-seven Laminoplasty, biopsy and resection of intramedullary spinal cord tumor;  Surgeon: [name], MD;  Location: [facility];  Service: Neurosurgery;  Laterality: N/A;  . NO PAST SURGERIES      There were no vitals filed for this visit.     SPRI step max lateral column GRAFO was tried on one foot but pt was reporting discomfort on lateral foot. Pt preferred [place] with medial posting  With rollator: 1 x 115' 1 x 414', 1 x 115'  With cane: 1 x 115',  Walking up and down ramp: 3x with st. Cane Walking up and down curb: 2x with st. Cane  Pt required CGA for above activities. Pt demo several instances of knee buckling without GRAFO and noted significant improvement in buckling with GRAFO bil.                           PT Short Term Goals - 10/13/19 1503      PT SHORT TERM GOAL #1   Title Patient will be able to ambulate 115' with cane to improve short distance ambulation within home    Baseline 115' with min/mod A with quad cane due to multi LOB and scissoring 25% (10/13/19)    Time 4    Period Weeks    Status Partially Met    Target Date 10/18/19      PT SHORT TERM GOAL #2   Title Pt will demo <12 seconds on timed up and go with RW to improve functional mobility    Baseline 14  seconds RW (eval); 13 seconds rollator (10/13/19)    Time 4    Period Weeks    Status New    Target Date 10/18/19      PT SHORT TERM GOAL #3   Title Patient will be able to go up and down a ramp and curb step to be able to negotiate community    Baseline CGA with rollator for 3' long ramp, forward and over aerobic stepper acting as curb x10 (10/13/19)    Time 4    Period Weeks    Status Achieved    Target Date 10/18/19             PT Long Term Goals - 09/20/19 1839      PT LONG TERM GOAL #1   Title Patient will demo >50/56 on berg balance scale to improve functional balance and reduce fall risk    Baseline 40/56 (eval)    Time 8    Period Weeks    Status New    Target Date 11/15/19      PT LONG TERM GOAL #2   Title Patient will be able to ambulate >500 feet with cane to improve short  distance community ambulation.    Baseline only walks with RW    Time 8    Period Weeks    Status New    Target Date 11/15/19      PT LONG TERM GOAL #3   Title Patient will demo 4+/5 strength in bil LE grossly to improve functional strength with transfers    Baseline 3+-4/5 grossly in bil LE    Time 8    Period Weeks    Status New    Target Date 11/15/19      PT LONG TERM GOAL #4   Title Patient will be able to go up and down 12 steps with use of 1-2 rails to be able to go up and down steps at home    Baseline 2 steps (with one rail) (Eval)    Time 8    Period Weeks    Status New    Target Date 11/15/19      PT LONG TERM GOAL #5   Title 6MWT goal    Baseline TBD    Time 8    Period Weeks    Status New    Target Date 11/15/19                  Patient will benefit from skilled therapeutic intervention in order to improve the following deficits and impairments:     Visit Diagnosis: No diagnosis found.     Problem List Patient Active Problem List   Diagnosis Date Noted  . Spasticity 08/18/2019  . Chronic pain syndrome 08/18/2019  . Nerve pain 08/18/2019  .  Clonus 07/21/2019  . Spinal cord tumor   . Pain   . Transaminitis   . Leukocytosis   . Neurogenic bowel   . Postoperative pain   . 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 Acute incomplete quadriplegia ([facility])   . 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 Numbness 05/10/2019  . Weakness of left lower extremity 05/10/2019  . 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 Visual disturbance 05/10/2019    Kerrie Pleasure 11/03/2019, 1:40 PM  [facility] [address] [place], [place], [address] Phone: [phone]   Fax:  [phone]  Name: Jacob Lambert MRN: [id number] Date of Birth: February 05, 1985

## 2019-11-06 ENCOUNTER — Other Ambulatory Visit: Payer: Self-pay

## 2019-11-06 ENCOUNTER — Ambulatory Visit: Payer: BC Managed Care – PPO | Admitting: Occupational Therapy

## 2019-11-06 ENCOUNTER — Ambulatory Visit: Payer: BC Managed Care – PPO | Attending: Physical Medicine and Rehabilitation

## 2019-11-06 ENCOUNTER — Encounter: Payer: Self-pay | Admitting: Occupational Therapy

## 2019-11-06 DIAGNOSIS — R293 Abnormal posture: Secondary | ICD-10-CM | POA: Diagnosis not present

## 2019-11-06 DIAGNOSIS — R52 Pain, unspecified: Secondary | ICD-10-CM | POA: Diagnosis not present

## 2019-11-06 DIAGNOSIS — R208 Other disturbances of skin sensation: Secondary | ICD-10-CM | POA: Insufficient documentation

## 2019-11-06 DIAGNOSIS — R262 Difficulty in walking, not elsewhere classified: Secondary | ICD-10-CM | POA: Diagnosis not present

## 2019-11-06 DIAGNOSIS — R278 Other lack of coordination: Secondary | ICD-10-CM | POA: Diagnosis not present

## 2019-11-06 DIAGNOSIS — M6281 Muscle weakness (generalized): Secondary | ICD-10-CM | POA: Insufficient documentation

## 2019-11-06 DIAGNOSIS — R41842 Visuospatial deficit: Secondary | ICD-10-CM

## 2019-11-06 DIAGNOSIS — D497 Neoplasm of unspecified behavior of endocrine glands and other parts of nervous system: Secondary | ICD-10-CM | POA: Diagnosis not present

## 2019-11-06 DIAGNOSIS — R2681 Unsteadiness on feet: Secondary | ICD-10-CM

## 2019-11-06 DIAGNOSIS — R2689 Other abnormalities of gait and mobility: Secondary | ICD-10-CM | POA: Diagnosis not present

## 2019-11-06 NOTE — Therapy (Signed)
[facility] [address] [place], [place], [address] Phone: [phone]   Fax:  [phone]  Physical Therapy Treatment  Patient Details  Name: Jacob Lambert MRN: [id number] Date of Birth: [birth year] Referring Provider (PT): Dr. Courtney Heys   Encounter Date: 11/06/2019   PT End of Session - 11/06/19 1721    Visit Number 13    Number of Visits 17    Date for PT Re-Evaluation 11/15/19    PT Start Time 1700    PT Stop Time 1745    PT Time Calculation (min) 45 min    Equipment Utilized During Treatment Gait belt    Activity Tolerance Patient tolerated treatment well;Patient limited by fatigue    Behavior During Therapy [facility] for tasks assessed/performed           Past Medical History:  Diagnosis Date  . Anxiety   . Clonus   . Depression   . Headache   . Pneumonia    "walking"  . Quadriplegia and quadriparesis ([place])   . Spasticity   . Spinal cord tumor   . Wheelchair bound     Past Surgical History:  Procedure Laterality Date  . LAMINECTOMY N/A 06/01/2019   Procedure: Cervical six-seven Laminoplasty, biopsy and resection of intramedullary spinal cord tumor;  Surgeon: [name], MD;  Location: [facility];  Service: Neurosurgery;  Laterality: N/A;  . NO PAST SURGERIES      There were no vitals filed for this visit.   Subjective Assessment - 11/06/19 1721    Subjective R leg feels really tired today    Limitations Standing;Walking;House hold activities    How long can you sit comfortably? no issues    How long can you stand comfortably? <5 min    How long can you walk comfortably? <0.25 mile    Patient Stated Goals Progress walking    Pain Onset More than a month ago              Gait training: straight cane 530' with CGA and bil GRAFO on . Standing on Airex: - wide BOS: EO: 1' - Wide BOS: EC: 2 x 1'  Partial tandem stance on floor: with head/body turns: 5x R and L Fwd step up: 6" box:  10x R and L bil HHA on parallel bar                          PT Short Term Goals - 10/13/19 1503      PT SHORT TERM GOAL #1   Title Patient will be able to ambulate 115' with cane to improve short distance ambulation within home    Baseline 115' with min/mod A with quad cane due to multi LOB and scissoring 25% (10/13/19)    Time 4    Period Weeks    Status Partially Met    Target Date 10/18/19      PT SHORT TERM GOAL #2   Title Pt will demo <12 seconds on timed up and go with RW to improve functional mobility    Baseline 14 seconds RW (eval); 13 seconds rollator (10/13/19)    Time 4    Period Weeks    Status New    Target Date 10/18/19      PT SHORT TERM GOAL #3   Title Patient will be able to go up and down a ramp and curb step to be able to negotiate community  Baseline CGA with rollator for 3' long ramp, forward and over aerobic stepper acting as curb x10 (10/13/19)    Time 4    Period Weeks    Status Achieved    Target Date 10/18/19             PT Long Term Goals - 09/20/19 1839      PT LONG TERM GOAL #1   Title Patient will demo >50/56 on berg balance scale to improve functional balance and reduce fall risk    Baseline 40/56 (eval)    Time 8    Period Weeks    Status New    Target Date 11/15/19      PT LONG TERM GOAL #2   Title Patient will be able to ambulate >500 feet with cane to improve short distance community ambulation.    Baseline only walks with RW    Time 8    Period Weeks    Status New    Target Date 11/15/19      PT LONG TERM GOAL #3   Title Patient will demo 4+/5 strength in bil LE grossly to improve functional strength with transfers    Baseline 3+-4/5 grossly in bil LE    Time 8    Period Weeks    Status New    Target Date 11/15/19      PT LONG TERM GOAL #4   Title Patient will be able to go up and down 12 steps with use of 1-2 rails to be able to go up and down steps at home    Baseline 2 steps (with one rail)  (Eval)    Time 8    Period Weeks    Status New    Target Date 11/15/19      PT LONG TERM GOAL #5   Title 6MWT goal    Baseline TBD    Time 8    Period Weeks    Status New    Target Date 11/15/19                 Plan - 11/06/19 1807    Clinical Impression Statement Pt able to amblate longer distance today compared to lst session. GRAFO bil are really helping to reduce knee buckling passively which is helping patient build strength and endurance through legs while keeping patient safe.    Personal Factors and Comorbidities Age;Past/Current Experience;Comorbidity 2;Time since onset of injury/illness/exacerbation;Transportation    Examination-Activity Limitations Bathing;Lift;Squat;Stairs;Stand;Transfers    Examination-Participation Restrictions Cleaning;Community Activity;Driving;Laundry;Occupation;Shop;Yard Work    Rehab Potential Fair    PT Frequency 2x / week    PT Duration 8 weeks    PT Treatment/Interventions ADLs/Self Care Home Management;Gait training;Stair training;Functional mobility training;Therapeutic activities;Therapeutic exercise;Balance training;Neuromuscular re-education;Manual techniques;Wheelchair mobility training;Patient/family education;Passive range of motion;Energy conservation;Vestibular;Visual/perceptual remediation/compensation;Joint Manipulations    PT Next Visit Plan Emphasize stretngthening R knee in terminal knee extension in NWB and WB positions to improve stability.    PT Home Exercise Plan Access Code: [email or web address]; [email or web address]           Patient will benefit from skilled therapeutic intervention in order to improve the following deficits and impairments:  Abnormal gait, Decreased activity tolerance, Decreased balance, Decreased cognition, Decreased mobility, Decreased endurance, Decreased coordination, Decreased range of motion, Decreased strength, Dizziness, Difficulty walking, Impaired sensation, Impaired tone, Impaired UE functional use, Postural  dysfunction, Pain  Visit Diagnosis: Muscle weakness (generalized)  Unsteadiness on feet  Other abnormalities of gait and mobility  Other lack of coordination  Difficulty in walking, not elsewhere classified  Spinal cord tumor     Problem List Patient Active Problem List   Diagnosis Date Noted  . Spasticity 08/18/2019  . Chronic pain syndrome 08/18/2019  . Nerve pain 08/18/2019  . Clonus 07/21/2019  . Spinal cord tumor   . Pain   . Transaminitis   . Leukocytosis   . Neurogenic bowel   . Postoperative pain   . 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 Acute incomplete quadriplegia ([facility])   . 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 Numbness 05/10/2019  . Weakness of left lower extremity 05/10/2019  . 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 Visual disturbance 05/10/2019    Kerrie Pleasure PT 11/06/2019, 6:08 PM  [facility] [address] [place], [place], [address] Phone: [phone]   Fax:  [phone]  Name: Jacob Lambert MRN: [id number] Date of Birth: July 27, 1985

## 2019-11-06 NOTE — Therapy (Signed)
[facility] [address] [place], [place], [address] Phone: [phone]   Fax:  [phone]  Occupational Therapy Treatment  Patient Details  Name: Jacob Lambert MRN: [id number] Date of Birth: 1985-07-20 Referring Provider (OT): Dr. Courtney Heys   Encounter Date: 11/06/2019   OT End of Session - 11/06/19 1621    Visit Number 9    Number of Visits 25    Date for OT Re-Evaluation 01/01/20    Authorization Time Period 30 visit limit - week 5 of 12 (11/1)    OT Start Time 1620    OT Stop Time 1700    OT Time Calculation (min) 40 min    Activity Tolerance Patient tolerated treatment well    Behavior During Therapy [facility] for tasks assessed/performed           Past Medical History:  Diagnosis Date  . Anxiety   . Clonus   . Depression   . Headache   . Pneumonia    "walking"  . Quadriplegia and quadriparesis ([place])   . Spasticity   . Spinal cord tumor   . Wheelchair bound     Past Surgical History:  Procedure Laterality Date  . LAMINECTOMY N/A 06/01/2019   Procedure: Cervical six-seven Laminoplasty, biopsy and resection of intramedullary spinal cord tumor;  Surgeon: [name], MD;  Location: [facility];  Service: Neurosurgery;  Laterality: N/A;  . NO PAST SURGERIES      There were no vitals filed for this visit.   Subjective Assessment - 11/06/19 1622    Subjective  Pt reports "normal pain" today.    Pertinent History PMH depression    Limitations Fall Risk.    Patient Stated Goals "I want to have as much functional independence as possible" more dexterity and typing skills for work, wear tights and wear heels    Currently in Pain? Yes    Pain Score 3     Pain Location Hand    Pain Orientation Left    Pain Descriptors / Indicators Pins and needles;Pressure    Pain Type Chronic pain    Pain Onset More than a month ago    Pain Frequency Constant                        OT  Treatments/Exercises (OP) - 11/06/19 1640      Shoulder Exercises: Standing   Other Standing Exercises standing at counter and reaching to mid and high level with BUE and placing and removing resistance clothespins with BUE with weight shifting and tolerated standing for approximately 8 minutes before requiring break.      Shoulder Exercises: ROM/Strengthening   UBE (Upper Arm Bike) 5 minutes level 3      Hand Exercises   Other Hand Exercises box and blocks . see goal      Visual/Perceptual Exercises   Other Exercises pt complaints of diplopia. 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 Discussed taping on glasses or eye patch for more fatiguing tasks for vision                    OT Short Term Goals - 11/06/19 1645      OT SHORT TERM GOAL #1   Title Pt will be independent with HEP for coordination and strengthening 11/06/19    Time 4    Period Weeks    Status On-going  yellow theraputty 10/8   Target Date 11/06/19      OT SHORT TERM GOAL #2   Title Pt will increase grip strength in BUE by 5 lbs in order to increase ability to manage clothing (tights, socks, pants) and increased independence with ADLs.    Baseline RUE 33.2 lbs LUE 35.4 lbs    Time 4    Period Weeks    Status Achieved   44.3 LUE; 52 RUE     OT SHORT TERM GOAL #3   Title Pt will improve functional use of LUE as evidenced by performing 50 blocks or more on Box & Blocks test.    Baseline LUE 43 blocks    Time 4    Period Weeks    Status Achieved   70     OT SHORT TERM GOAL #4   Title Pt will perform complex meal prep and/or housekeeping tasks with mod I and showing good safety awareness in order to increase independence with IADLs.    Time 4    Period Weeks    Status New      OT SHORT TERM GOAL #5   Title Pt will verbalize compensatory strategies for visual deficits.    Time 4    Period Weeks    Status New      OT SHORT TERM GOAL #6   Title Pt  will improve 9 hole peg test score to 33 seconds or less with BUE in order to increase fine motor coordination in order to increase independence with fasteners and jewelry, etc.    Time 4    Period Weeks    Status Achieved   LUE 27.09 seconds RUE 30.06 seconds     OT SHORT TERM GOAL #7   Title Pt will obtain a lightweight object from higher level cabinet with LUE, dominant hand, for increasing independence with ADLs and IADLs.    Time 4    Period Weeks    Status Achieved      OT SHORT TERM GOAL #8   Title Pt will increase keyboarding/typing skills to completing 30 wpm in order to prep for back to work.    Time 4    Period Weeks    Status Achieved   43 wpm            OT Long Term Goals - 11/06/19 1646      OT LONG TERM GOAL #1   Title Pt will be independent with updated HEP for coordination and strengthening 01/02/20    Time 12    Period Weeks    Status New      OT LONG TERM GOAL #2   Title Pt will increase grip strength in BUE by 10 lbs in order to increase ability to manage clothing (tights, socks, pants) and increased independence with ADLs.    Baseline LUE 35.4 RUE 33.2    Time 12    Period Weeks    Status New      OT LONG TERM GOAL #3   Title Pt will improve functional use of LUE as evidenced by performing 60 blocks or more on Box & Blocks test.    Baseline LUE 43    Time 12    Period Weeks    Status Achieved   70     OT LONG TERM GOAL #4   Title Pt will improve 9 hole peg test score to 25 seconds or less with BUE in order to increase fine motor coordination in  order to increase independence with fasteners and jewelry, etc.    Baseline RUE 37.84 LUE 38.57    Time 12    Period Weeks    Status New      OT LONG TERM GOAL #5   Title Pt will obtain an object from cabinet greater than or equal to 5lbs with LUE, dominant hand, for increasing independence with ADLs and IADLs.    Time 12    Period Weeks    Status New      OT LONG TERM GOAL #6   Title Pt will  increase keyboarding/typing skills to completing 50 wpm in order to prep for back to work.    Time 12    Period Weeks    Status New                 Plan - 11/06/19 1642    Clinical Impression Statement Pt progressing with standing balance and tolerance to prepare for IADLs and ADLs. Continuing to progress towards goals.    OT Occupational Profile and History Detailed Assessment- Review of Records and additional review of physical, cognitive, psychosocial history related to current functional performance    Occupational performance deficits (Please refer to evaluation for details): IADL's;ADL's;Work;Social Participation;Leisure    Body Structure / Function / Physical Skills Strength;Gait;Decreased knowledge of use of DME;ADL;Balance;Dexterity;GMC;Pain;Tone;Body mechanics;UE functional use;Endurance;IADL;ROM;Vision;Coordination;Flexibility;Mobility;Sensation;FMC    Rehab Potential Good    Clinical Decision Making Limited treatment options, no task modification necessary    Comorbidities Affecting Occupational Performance: May have comorbidities impacting occupational performance    Modification or Assistance to Complete Evaluation  No modification of tasks or assist necessary to complete eval    OT Frequency 2x / week    OT Duration 12 weeks    OT Treatment/Interventions Self-care/ADL training;Moist Heat;Fluidtherapy;DME and/or AE instruction;Balance training;Therapeutic activities;Gait Training;Ultrasound;Therapeutic exercise;Scar mobilization;Visual/perceptual remediation/compensation;Passive range of motion;Functional Mobility Training;Neuromuscular education;Electrical Stimulation;Paraffin;Energy conservation;Manual Therapy;Patient/family education    Plan LUE coordination, UEB    Consulted and Agree with Plan of Care Patient           Patient will benefit from skilled therapeutic intervention in order to improve the following deficits and impairments:   Body Structure / Function  / Physical Skills: Strength, Gait, Decreased knowledge of use of DME, ADL, Balance, Dexterity, GMC, Pain, Tone, Body mechanics, UE functional use, Endurance, IADL, ROM, Vision, Coordination, Flexibility, Mobility, Sensation, [facility]       Visit Diagnosis: Muscle weakness (generalized)  Unsteadiness on feet  Other abnormalities of gait and mobility  Other lack of coordination  Visuospatial deficit    Problem List Patient Active Problem List   Diagnosis Date Noted  . Spasticity 08/18/2019  . Chronic pain syndrome 08/18/2019  . Nerve pain 08/18/2019  . Clonus 07/21/2019  . Spinal cord tumor   . Pain   . Transaminitis   . Leukocytosis   . Neurogenic bowel   . Postoperative pain   . 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 Acute incomplete quadriplegia ([facility])   . 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 Numbness 05/10/2019  . Weakness of left lower extremity 05/10/2019  . 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 Visual disturbance 05/10/2019    Zachery Conch MOT, OTR/L  11/06/2019, 5:55 PM  [facility] [address] [place], [place], [address] Phone: [phone]   Fax:  [phone]  Name: Jacob Lambert MRN: [id number] Date of Birth: 1985-04-23

## 2019-11-08 ENCOUNTER — Other Ambulatory Visit: Payer: Self-pay

## 2019-11-08 ENCOUNTER — Encounter: Payer: Self-pay | Admitting: Physical Medicine and Rehabilitation

## 2019-11-08 ENCOUNTER — Encounter
Payer: BC Managed Care – PPO | Attending: Physical Medicine and Rehabilitation | Admitting: Physical Medicine and Rehabilitation

## 2019-11-08 VITALS — BP 123/86 | HR 76 | Temp 98.3°F | Ht 70.0 in | Wt 144.6 lb

## 2019-11-08 DIAGNOSIS — M21371 Foot drop, right foot: Secondary | ICD-10-CM | POA: Diagnosis not present

## 2019-11-08 DIAGNOSIS — G8222 Paraplegia, incomplete: Secondary | ICD-10-CM | POA: Diagnosis not present

## 2019-11-08 DIAGNOSIS — M21372 Foot drop, left foot: Secondary | ICD-10-CM

## 2019-11-08 DIAGNOSIS — G894 Chronic pain syndrome: Secondary | ICD-10-CM

## 2019-11-08 DIAGNOSIS — R252 Cramp and spasm: Secondary | ICD-10-CM | POA: Diagnosis not present

## 2019-11-08 DIAGNOSIS — C719 Malignant neoplasm of brain, unspecified: Secondary | ICD-10-CM

## 2019-11-08 DIAGNOSIS — K592 Neurogenic bowel, not elsewhere classified: Secondary | ICD-10-CM | POA: Diagnosis present

## 2019-11-08 NOTE — Progress Notes (Signed)
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   Spasticity increased with full switchover.  It's really only noticeable overnight/early AM- shaking more/getting up.  The lack of drowsiness SO much better.   Bowels better since radiation- using less suppositories.  Generally responding to dig stim.  Flies to [place] next week- going  To use condom cath on flight.  Get power w/c when there  Exam: Awake, alert, appropriate, using rollator to walk, well dressed, NAD- good color  MS:  DF 4- to 4/5 in LEs B/L; PF 4/5 B/L   Plan: 1. steroid injection was performed at L knee steroid injection  using 1% plain Lidocaine and 40mg  /1cc of Kenalog. This was well tolerated.  Cleaned with betadine x3 and allowed to dry- then alcohol then injected using 27 gauge 1.5 inch needle- no bleeding or complications.    F/U in 3+ months for  L knee steroid injection Lidocaine will kick in 15 minutes- and wear off tonight- the steroid will kick in tomorrow within 24 hours and take up to 72 hours to fully kick in.  2. Pt is appropriate to get B/L ground reaction AFOs to prevent knee buckling and falling and able to have a more fluent gait/less falls.  Needs to have for improved gait/less falls- which could damage pt.   3. Continue with Dantrolene 100 mg BID and Baclofen 10 mg QID now- less sedated.   4. Needs a letter saying he's disabled. Can have Rx.   5. [facility] B/L ground reaction AFO's- for B/L foot drop/LE weakness  6. F/U as scheduled.   I spent a total of 25 minutes on appointment- as detailed above.   5.

## 2019-11-08 NOTE — Patient Instructions (Signed)
1. steroid injection was performed at L knee steroid injection  using 1% plain Lidocaine and 40mg  /1cc of Kenalog. This was well tolerated.  Cleaned with betadine x3 and allowed to dry- then alcohol then injected using 27 gauge 1.5 inch needle- no bleeding or complications.    F/U in 3+ months for  L knee steroid injection Lidocaine will kick in 15 minutes- and wear off tonight- the steroid will kick in tomorrow within 24 hours and take up to 72 hours to fully kick in.  2. Pt is appropriate to get B/L ground reaction AFOs to prevent knee buckling and falling and able to have a more fluent gait/less falls.  Needs to have for improved gait/less falls- which could damage pt.   3. Continue with Dantrolene 100 mg BID and Baclofen 10 mg QID now- less sedated.   4. Needs a letter saying he's disabled. Can have Rx.   5. [facility] B/L ground reaction AFO's- for B/L foot drop/LE weakness  6. F/U as scheduled.

## 2019-11-09 DIAGNOSIS — Z6820 Body mass index (BMI) 20.0-20.9, adult: Secondary | ICD-10-CM | POA: Diagnosis not present

## 2019-11-09 DIAGNOSIS — D497 Neoplasm of unspecified behavior of endocrine glands and other parts of nervous system: Secondary | ICD-10-CM | POA: Diagnosis not present

## 2019-11-10 ENCOUNTER — Ambulatory Visit: Payer: BC Managed Care – PPO | Admitting: Occupational Therapy

## 2019-11-10 ENCOUNTER — Encounter: Payer: Self-pay | Admitting: Occupational Therapy

## 2019-11-10 ENCOUNTER — Other Ambulatory Visit: Payer: Self-pay

## 2019-11-10 ENCOUNTER — Ambulatory Visit: Payer: BC Managed Care – PPO

## 2019-11-10 DIAGNOSIS — M6281 Muscle weakness (generalized): Secondary | ICD-10-CM

## 2019-11-10 DIAGNOSIS — R52 Pain, unspecified: Secondary | ICD-10-CM | POA: Diagnosis not present

## 2019-11-10 DIAGNOSIS — R278 Other lack of coordination: Secondary | ICD-10-CM

## 2019-11-10 DIAGNOSIS — R293 Abnormal posture: Secondary | ICD-10-CM | POA: Diagnosis not present

## 2019-11-10 DIAGNOSIS — R2689 Other abnormalities of gait and mobility: Secondary | ICD-10-CM

## 2019-11-10 DIAGNOSIS — R2681 Unsteadiness on feet: Secondary | ICD-10-CM

## 2019-11-10 DIAGNOSIS — R262 Difficulty in walking, not elsewhere classified: Secondary | ICD-10-CM | POA: Diagnosis not present

## 2019-11-10 DIAGNOSIS — R41842 Visuospatial deficit: Secondary | ICD-10-CM | POA: Diagnosis not present

## 2019-11-10 DIAGNOSIS — R208 Other disturbances of skin sensation: Secondary | ICD-10-CM | POA: Diagnosis not present

## 2019-11-10 DIAGNOSIS — D497 Neoplasm of unspecified behavior of endocrine glands and other parts of nervous system: Secondary | ICD-10-CM | POA: Diagnosis not present

## 2019-11-10 NOTE — Therapy (Signed)
[facility] [address] [place], [place], [address] Phone: [phone]   Fax:  [phone]  Occupational Therapy Treatment  Patient Details  Name: Jacob Lambert MRN: [id number] Date of Birth: [birth year] Referring Provider (OT): Dr. Courtney Heys   Encounter Date: 11/10/2019   OT End of Session - 11/10/19 1405    Visit Number 10    Number of Visits 25    Date for OT Re-Evaluation 01/01/20    Authorization Time Period 30 visit limit - week 5 of 12 (11/1)    OT Start Time 1405    OT Stop Time 1448    OT Time Calculation (min) 43 min    Activity Tolerance Patient tolerated treatment well    Behavior During Therapy [facility] for tasks assessed/performed           Past Medical History:  Diagnosis Date  . Anxiety   . Clonus   . Depression   . Headache   . Pneumonia    "walking"  . Quadriplegia and quadriparesis ([place])   . Spasticity   . Spinal cord tumor   . Wheelchair bound     Past Surgical History:  Procedure Laterality Date  . LAMINECTOMY N/A 06/01/2019   Procedure: Cervical six-seven Laminoplasty, biopsy and resection of intramedullary spinal cord tumor;  Surgeon: [name], MD;  Location: [place];  Service: Neurosurgery;  Laterality: N/A;  . NO PAST SURGERIES      There were no vitals filed for this visit.   Subjective Assessment - 11/10/19 1406    Subjective  I got my steroid shot. Pt reports mixed feelings about the steroid shot. I am going condom catheter shopping after this.    Pertinent History PMH depression    Limitations Fall Risk.    Patient Stated Goals "I want to have as much functional independence as possible" more dexterity and typing skills for work, wear tights and wear heels    Currently in Pain? Yes    Pain Score 4     Pain Location Hand    Pain Orientation Left    Pain Descriptors / Indicators Pins and needles;Pressure    Pain Type Chronic pain    Pain Onset More than a  month ago    Pain Frequency Constant                        OT Treatments/Exercises (OP) - 11/10/19 1415      Exercises   Exercises Elbow      Cognitive Exercises   Keyboarding left handed typing words; typing master test with 70 wpm and 85% accuracy      Shoulder Exercises: Seated   Protraction Strengthening;Both;10 reps;Theraband    Theraband Level (Shoulder Protraction) Level 2 (Red)    Horizontal ABduction Strengthening;Both;10 reps;Theraband    Theraband Level (Shoulder Horizontal ABduction) Level 2 (Red)      Elbow Exercises   Elbow Flexion Strengthening;Both;10 reps    Theraband Level (Elbow Flexion) Level 2 (Red)      Hand Exercises   Other Hand Exercises resistance clothespins 1-8# with 4th/5th digits and thumb for increase in strength and coordination with LUE; 2nd set with RUE with whole hand                  OT Education - 11/10/19 1431    Education Details discussed vision strategies for double vision with taping glasses for occlusion    Person(s) Educated Patient  Methods Explanation;Demonstration    Comprehension Verbalized understanding;Returned demonstration            OT Short Term Goals - 11/10/19 1413      OT SHORT TERM GOAL #1   Title Pt will be independent with HEP for coordination and strengthening 11/06/19    Time 4    Period Weeks    Status On-going   yellow theraputty 10/8   Target Date 11/06/19      OT SHORT TERM GOAL #2   Title Pt will increase grip strength in BUE by 5 lbs in order to increase ability to manage clothing (tights, socks, pants) and increased independence with ADLs.    Baseline RUE 33.2 lbs LUE 35.4 lbs    Time 4    Period Weeks    Status Achieved   44.3 LUE; 52 RUE     OT SHORT TERM GOAL #3   Title Pt will improve functional use of LUE as evidenced by performing 50 blocks or more on Box & Blocks test.    Baseline LUE 43 blocks    Time 4    Period Weeks    Status Achieved   70     OT SHORT  TERM GOAL #4   Title Pt will perform complex meal prep and/or housekeeping tasks with mod I and showing good safety awareness in order to increase independence with IADLs.    Time 4    Period Weeks    Status New      OT SHORT TERM GOAL #5   Title Pt will verbalize compensatory strategies for visual deficits.    Time 4    Period Weeks    Status Achieved      OT SHORT TERM GOAL #6   Title Pt will improve 9 hole peg test score to 33 seconds or less with BUE in order to increase fine motor coordination in order to increase independence with fasteners and jewelry, etc.    Time 4    Period Weeks    Status Achieved   LUE 27.09 seconds RUE 30.06 seconds     OT SHORT TERM GOAL #7   Title Pt will obtain a lightweight object from higher level cabinet with LUE, dominant hand, for increasing independence with ADLs and IADLs.    Time 4    Period Weeks    Status Achieved      OT SHORT TERM GOAL #8   Title Pt will increase keyboarding/typing skills to completing 30 wpm in order to prep for back to work.    Time 4    Period Weeks    Status Achieved   43 wpm            OT Long Term Goals - 11/06/19 1646      OT LONG TERM GOAL #1   Title Pt will be independent with updated HEP for coordination and strengthening 01/02/20    Time 12    Period Weeks    Status New      OT LONG TERM GOAL #2   Title Pt will increase grip strength in BUE by 10 lbs in order to increase ability to manage clothing (tights, socks, pants) and increased independence with ADLs.    Baseline LUE 35.4 RUE 33.2    Time 12    Period Weeks    Status New      OT LONG TERM GOAL #3   Title Pt will improve functional use of LUE as evidenced by performing  60 blocks or more on Box & Blocks test.    Baseline LUE 43    Time 12    Period Weeks    Status Achieved   70     OT LONG TERM GOAL #4   Title Pt will improve 9 hole peg test score to 25 seconds or less with BUE in order to increase fine motor coordination in order to  increase independence with fasteners and jewelry, etc.    Baseline RUE 37.84 LUE 38.57    Time 12    Period Weeks    Status New      OT LONG TERM GOAL #5   Title Pt will obtain an object from cabinet greater than or equal to 5lbs with LUE, dominant hand, for increasing independence with ADLs and IADLs.    Time 12    Period Weeks    Status New      OT LONG TERM GOAL #6   Title Pt will increase keyboarding/typing skills to completing 50 wpm in order to prep for back to work.    Time 12    Period Weeks    Status New                 Plan - 11/10/19 1419    Clinical Impression Statement Pt progressing towards goals. Pt continues to report pain and discomfort in LUE 4th/5th digit and lack of cooridnation and strength.    OT Occupational Profile and History Detailed Assessment- Review of Records and additional review of physical, cognitive, psychosocial history related to current functional performance    Occupational performance deficits (Please refer to evaluation for details): IADL's;ADL's;Work;Social Participation;Leisure    Body Structure / Function / Physical Skills Strength;Gait;Decreased knowledge of use of DME;ADL;Balance;Dexterity;GMC;Pain;Tone;Body mechanics;UE functional use;Endurance;IADL;ROM;Vision;Coordination;Flexibility;Mobility;Sensation;FMC    Rehab Potential Good    Clinical Decision Making Limited treatment options, no task modification necessary    Comorbidities Affecting Occupational Performance: May have comorbidities impacting occupational performance    Modification or Assistance to Complete Evaluation  No modification of tasks or assist necessary to complete eval    OT Frequency 2x / week    OT Duration 12 weeks    OT Treatment/Interventions Self-care/ADL training;Moist Heat;Fluidtherapy;DME and/or AE instruction;Balance training;Therapeutic activities;Gait Training;Ultrasound;Therapeutic exercise;Scar mobilization;Visual/perceptual  remediation/compensation;Passive range of motion;Functional Mobility Training;Neuromuscular education;Electrical Stimulation;Paraffin;Energy conservation;Manual Therapy;Patient/family education    Plan LUE coordination, UEB    Consulted and Agree with Plan of Care Patient           Patient will benefit from skilled therapeutic intervention in order to improve the following deficits and impairments:   Body Structure / Function / Physical Skills: Strength, Gait, Decreased knowledge of use of DME, ADL, Balance, Dexterity, GMC, Pain, Tone, Body mechanics, UE functional use, Endurance, IADL, ROM, Vision, Coordination, Flexibility, Mobility, Sensation, [facility]       Visit Diagnosis: Muscle weakness (generalized)  Unsteadiness on feet  Other abnormalities of gait and mobility  Other lack of coordination  Visuospatial deficit    Problem List Patient Active Problem List   Diagnosis Date Noted  . Incomplete paraplegia ([place]) 11/08/2019  . Foot drop, bilateral 11/08/2019  . Spasticity 08/18/2019  . Chronic pain syndrome 08/18/2019  . Nerve pain 08/18/2019  . Clonus 07/21/2019  . Spinal cord tumor   . Pain   . Transaminitis   . Leukocytosis   . Neurogenic bowel   . Postoperative pain   . 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 Acute incomplete quadriplegia ([facility])   . 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 Numbness 05/10/2019  . Weakness of left lower extremity 05/10/2019  . 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 Visual disturbance 05/10/2019    Zachery Conch MOT, OTR/L  11/10/2019, 4:16 PM  [facility] [address] [place], [place], [address] Phone: [phone]   Fax:  [phone]  Name: Ford Peddie MRN: [id number] Date of Birth: 25-Sep-1985

## 2019-11-10 NOTE — Patient Instructions (Addendum)
  Typing:  Left-handed Words  1. wear 2. were 3. are 4. bear 5. bare 6. care 7. dear 8. deer 9. stare 10. react 11. dare  12. sad 13. gear 14. great 15. rate  16. date 17. red 18. read 19. fear 20. free 21. tea 22. tear 23. fast 24. waste 25. treat 26. tease 27. zest 28. wax 29. cast 30. vase 31. vast 32. cat 33. basket 34. earn 35. casket 36. bee 37. seat 38. ate 39. gas 40. rare 41. seed 42. tax 43. fax 44. feed 45. cart 46. art 47. tart 48. taste 49. ear 50. here

## 2019-11-10 NOTE — Therapy (Signed)
[facility] [address] [place], [place], [address] Phone: [phone]   Fax:  [phone]  Physical Therapy Treatment  Patient Details  Name: Jacob Lambert MRN: [id number] Date of Birth: 1985/04/06 Referring Provider (PT): Dr. Courtney Heys   Encounter Date: 11/10/2019   PT End of Session - 11/10/19 1517    Visit Number 14    Number of Visits 17    Date for PT Re-Evaluation 11/15/19    PT Start Time 5170    PT Stop Time 1530    PT Time Calculation (min) 45 min    Equipment Utilized During Treatment Gait belt    Activity Tolerance Patient tolerated treatment well;Patient limited by fatigue    Behavior During Therapy [facility] for tasks assessed/performed           Past Medical History:  Diagnosis Date  . Anxiety   . Clonus   . Depression   . Headache   . Pneumonia    "walking"  . Quadriplegia and quadriparesis ([place])   . Spasticity   . Spinal cord tumor   . Wheelchair bound     Past Surgical History:  Procedure Laterality Date  . LAMINECTOMY N/A 06/01/2019   Procedure: Cervical six-seven Laminoplasty, biopsy and resection of intramedullary spinal cord tumor;  Surgeon: [name], MD;  Location: [facility];  Service: Neurosurgery;  Laterality: N/A;  . NO PAST SURGERIES      There were no vitals filed for this visit.   Subjective Assessment - 11/10/19 1503    Subjective No new falls. No new changes    Limitations Standing;Walking;House hold activities    How long can you sit comfortably? no issues    How long can you stand comfortably? <5 min    How long can you walk comfortably? <0.25 mile    Patient Stated Goals Progress walking    Pain Onset More than a month ago              Walking fwd and bwd: 3 x 10 feet without AD and CGA and min A, cues to keep wide BOS to improve stability Walking in 4 direction with resisted walking: 3 x 10 feet in each direction  Box taps: 6" box without HHA:  20x R and L Leg press: 50lbs - pt unable to perform 50lbs on R so dropped the weight down to 30lbs 2 x 10 R and L                         PT Short Term Goals - 10/13/19 1503      PT SHORT TERM GOAL #1   Title Patient will be able to ambulate 115' with cane to improve short distance ambulation within home    Baseline 115' with min/mod A with quad cane due to multi LOB and scissoring 25% (10/13/19)    Time 4    Period Weeks    Status Partially Met    Target Date 10/18/19      PT SHORT TERM GOAL #2   Title Pt will demo <12 seconds on timed up and go with RW to improve functional mobility    Baseline 14 seconds RW (eval); 13 seconds rollator (10/13/19)    Time 4    Period Weeks    Status New    Target Date 10/18/19      PT SHORT TERM GOAL #3   Title Patient will be able to  go up and down a ramp and curb step to be able to negotiate community    Baseline CGA with rollator for 3' long ramp, forward and over aerobic stepper acting as curb x10 (10/13/19)    Time 4    Period Weeks    Status Achieved    Target Date 10/18/19             PT Long Term Goals - 09/20/19 1839      PT LONG TERM GOAL #1   Title Patient will demo >50/56 on berg balance scale to improve functional balance and reduce fall risk    Baseline 40/56 (eval)    Time 8    Period Weeks    Status New    Target Date 11/15/19      PT LONG TERM GOAL #2   Title Patient will be able to ambulate >500 feet with cane to improve short distance community ambulation.    Baseline only walks with RW    Time 8    Period Weeks    Status New    Target Date 11/15/19      PT LONG TERM GOAL #3   Title Patient will demo 4+/5 strength in bil LE grossly to improve functional strength with transfers    Baseline 3+-4/5 grossly in bil LE    Time 8    Period Weeks    Status New    Target Date 11/15/19      PT LONG TERM GOAL #4   Title Patient will be able to go up and down 12 steps with use of 1-2 rails to be  able to go up and down steps at home    Baseline 2 steps (with one rail) (Eval)    Time 8    Period Weeks    Status New    Target Date 11/15/19      PT LONG TERM GOAL #5   Title 6MWT goal    Baseline TBD    Time 8    Period Weeks    Status New    Target Date 11/15/19                 Plan - 11/10/19 1503    Clinical Impression Statement Today's skilled session was focused on progressing walking without AD for short distance ambulation, progressing to resisted walking to improve core control and muscle activation in legs.    Personal Factors and Comorbidities Age;Past/Current Experience;Comorbidity 2;Time since onset of injury/illness/exacerbation;Transportation    Examination-Activity Limitations Bathing;Lift;Squat;Stairs;Stand;Transfers    Examination-Participation Restrictions Cleaning;Community Activity;Driving;Laundry;Occupation;Shop;Yard Work    Rehab Potential Fair    PT Frequency 2x / week    PT Duration 8 weeks    PT Treatment/Interventions ADLs/Self Care Home Management;Gait training;Stair training;Functional mobility training;Therapeutic activities;Therapeutic exercise;Balance training;Neuromuscular re-education;Manual techniques;Wheelchair mobility training;Patient/family education;Passive range of motion;Energy conservation;Vestibular;Visual/perceptual remediation/compensation;Joint Manipulations    PT Next Visit Plan Emphasize stretngthening R knee in terminal knee extension in NWB and WB positions to improve stability.    PT Home Exercise Plan Access Code: [email or web address]           Patient will benefit from skilled therapeutic intervention in order to improve the following deficits and impairments:  Abnormal gait, Decreased activity tolerance, Decreased balance, Decreased cognition, Decreased mobility, Decreased endurance, Decreased coordination, Decreased range of motion, Decreased strength, Dizziness, Difficulty walking, Impaired sensation, Impaired tone,  Impaired UE functional use, Postural dysfunction, Pain  Visit Diagnosis: Muscle weakness (generalized)  Unsteadiness on feet  Other abnormalities  of gait and mobility  Difficulty in walking, not elsewhere classified  Spinal cord tumor     Problem List Patient Active Problem List   Diagnosis Date Noted  . Incomplete paraplegia ([place]) 11/08/2019  . Foot drop, bilateral 11/08/2019  . Spasticity 08/18/2019  . Chronic pain syndrome 08/18/2019  . Nerve pain 08/18/2019  . Clonus 07/21/2019  . Spinal cord tumor   . Pain   . Transaminitis   . Leukocytosis   . Neurogenic bowel   . Postoperative pain   . 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 Acute incomplete quadriplegia ([facility])   . 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 Numbness 05/10/2019  . Weakness of left lower extremity 05/10/2019  . 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 Visual disturbance 05/10/2019    Kerrie Pleasure, PT 11/10/2019, 3:22 PM  [facility] [address] [place], [place], [address] Phone: [phone]   Fax:  [phone]  Name: Jacob Lambert MRN: [id number] Date of Birth: [birth year]

## 2019-11-13 ENCOUNTER — Ambulatory Visit: Payer: BC Managed Care – PPO | Admitting: Occupational Therapy

## 2019-11-13 ENCOUNTER — Other Ambulatory Visit: Payer: Self-pay

## 2019-11-13 ENCOUNTER — Encounter: Payer: Self-pay | Admitting: Physical Therapy

## 2019-11-13 ENCOUNTER — Encounter: Payer: Self-pay | Admitting: Occupational Therapy

## 2019-11-13 ENCOUNTER — Ambulatory Visit: Payer: BC Managed Care – PPO | Admitting: Physical Therapy

## 2019-11-13 DIAGNOSIS — R2681 Unsteadiness on feet: Secondary | ICD-10-CM

## 2019-11-13 DIAGNOSIS — R262 Difficulty in walking, not elsewhere classified: Secondary | ICD-10-CM

## 2019-11-13 DIAGNOSIS — D497 Neoplasm of unspecified behavior of endocrine glands and other parts of nervous system: Secondary | ICD-10-CM | POA: Diagnosis not present

## 2019-11-13 DIAGNOSIS — R293 Abnormal posture: Secondary | ICD-10-CM | POA: Diagnosis not present

## 2019-11-13 DIAGNOSIS — R278 Other lack of coordination: Secondary | ICD-10-CM | POA: Diagnosis not present

## 2019-11-13 DIAGNOSIS — R41842 Visuospatial deficit: Secondary | ICD-10-CM | POA: Diagnosis not present

## 2019-11-13 DIAGNOSIS — M6281 Muscle weakness (generalized): Secondary | ICD-10-CM

## 2019-11-13 DIAGNOSIS — R2689 Other abnormalities of gait and mobility: Secondary | ICD-10-CM

## 2019-11-13 DIAGNOSIS — R52 Pain, unspecified: Secondary | ICD-10-CM | POA: Diagnosis not present

## 2019-11-13 DIAGNOSIS — R208 Other disturbances of skin sensation: Secondary | ICD-10-CM

## 2019-11-13 NOTE — Therapy (Signed)
[facility] [address] [place], [place], [address] Phone: [phone]   Fax:  [phone]  Physical Therapy Treatment  Patient Details  Name: Jacob Lambert MRN: [id number] Date of Birth: 1985/07/09 Referring Provider (PT): Dr. Courtney Heys   Encounter Date: 11/13/2019   PT End of Session - 11/13/19 1800    Visit Number 15    Number of Visits 17    Date for PT Re-Evaluation 11/15/19    PT Start Time 1700    PT Stop Time 1745    PT Time Calculation (min) 45 min    Equipment Utilized During Treatment Gait belt    Activity Tolerance Patient tolerated treatment well;Patient limited by fatigue    Behavior During Therapy [facility] for tasks assessed/performed           Past Medical History:  Diagnosis Date  . Anxiety   . Clonus   . Depression   . Headache   . Pneumonia    "walking"  . Quadriplegia and quadriparesis ([place])   . Spasticity   . Spinal cord tumor   . Wheelchair bound     Past Surgical History:  Procedure Laterality Date  . LAMINECTOMY N/A 06/01/2019   Procedure: Cervical six-seven Laminoplasty, biopsy and resection of intramedullary spinal cord tumor;  Surgeon: [name], MD;  Location: [facility];  Service: Neurosurgery;  Laterality: N/A;  . NO PAST SURGERIES      There were no vitals filed for this visit.   Subjective Assessment - 11/13/19 1756    Subjective Pt has no new complaints. Pt is still waiting for his braces.    Limitations Standing;Walking;House hold activities    How long can you sit comfortably? no issues    How long can you stand comfortably? <5 min    How long can you walk comfortably? <0.25 mile    Patient Stated Goals Progress walking    Currently in Pain? Yes    Pain Score 6     Pain Location Leg    Pain Orientation Left    Pain Type Acute pain    Pain Onset More than a month ago             Amb 115' SPC with GRAFO CGA. No instances of knee buckling noted; no  rest breaks required.  Lateral walking bilat x50' Backwards walking x 30' Four square stepping 5x CW & 5x CCW. Completed 4 square step test in 32.6 sec.  Stepping over 3 orange hurdle over 10' x 2 sets Side ways stepping over 3 orange hurdles over 10' x 2 sets Step up and over 6" step x10 Side ways stepping over 6" step x10 Standing on foam: EC feet apart with head turns x30 sec; with head nods x30 sec Standing on foam: EC feet together with head turns x30 sec; with head nods x30 sec Leg press bilat: 50# x 10, 75# x10 Leg press R LE: 30# x10, 35# 2x10 Leg press L LE: 55# 3x10        PT Short Term Goals - 10/13/19 1503      PT SHORT TERM GOAL #1   Title Patient will be able to ambulate 115' with cane to improve short distance ambulation within home    Baseline 115' with min/mod A with quad cane due to multi LOB and scissoring 25% (10/13/19)    Time 4    Period Weeks    Status Partially Met    Target Date  10/18/19      PT SHORT TERM GOAL #2   Title Pt will demo <12 seconds on timed up and go with RW to improve functional mobility    Baseline 14 seconds RW (eval); 13 seconds rollator (10/13/19)    Time 4    Period Weeks    Status New    Target Date 10/18/19      PT SHORT TERM GOAL #3   Title Patient will be able to go up and down a ramp and curb step to be able to negotiate community    Baseline CGA with rollator for 3' long ramp, forward and over aerobic stepper acting as curb x10 (10/13/19)    Time 4    Period Weeks    Status Achieved    Target Date 10/18/19             PT Long Term Goals - 09/20/19 1839      PT LONG TERM GOAL #1   Title Patient will demo >50/56 on berg balance scale to improve functional balance and reduce fall risk    Baseline 40/56 (eval)    Time 8    Period Weeks    Status New    Target Date 11/15/19      PT LONG TERM GOAL #2   Title Patient will be able to ambulate >500 feet with cane to improve short distance community ambulation.     Baseline only walks with RW    Time 8    Period Weeks    Status New    Target Date 11/15/19      PT LONG TERM GOAL #3   Title Patient will demo 4+/5 strength in bil LE grossly to improve functional strength with transfers    Baseline 3+-4/5 grossly in bil LE    Time 8    Period Weeks    Status New    Target Date 11/15/19      PT LONG TERM GOAL #4   Title Patient will be able to go up and down 12 steps with use of 1-2 rails to be able to go up and down steps at home    Baseline 2 steps (with one rail) (Eval)    Time 8    Period Weeks    Status New    Target Date 11/15/19      PT LONG TERM GOAL #5   Title 6MWT goal    Baseline TBD    Time 8    Period Weeks    Status New    Target Date 11/15/19                 Plan - 11/13/19 1757    Clinical Impression Statement Treatment session focused on ambulating with GRAFO. Focus primarily on obstacle negotiation, directional changes/lateral and backwards movement. Strengthening continues via leg press.    Personal Factors and Comorbidities Age;Past/Current Experience;Comorbidity 2;Time since onset of injury/illness/exacerbation;Transportation    Examination-Activity Limitations Bathing;Lift;Squat;Stairs;Stand;Transfers    Examination-Participation Restrictions Cleaning;Community Activity;Driving;Laundry;Occupation;Shop;Yard Work    Rehab Potential Fair    PT Frequency 2x / week    PT Duration 8 weeks    PT Treatment/Interventions ADLs/Self Care Home Management;Gait training;Stair training;Functional mobility training;Therapeutic activities;Therapeutic exercise;Balance training;Neuromuscular re-education;Manual techniques;Wheelchair mobility training;Patient/family education;Passive range of motion;Energy conservation;Vestibular;Visual/perceptual remediation/compensation;Joint Manipulations    PT Next Visit Plan Re-Eval/recheck LTGs! Emphasize stretngthening R knee in terminal knee extension in NWB and WB positions to improve  stability. Work on Engineer, drilling and backwards movement with  amb.    PT Home Exercise Plan Access Code: [email or web address]; [email or web address]    Consulted and Agree with Plan of Care Patient           Patient will benefit from skilled therapeutic intervention in order to improve the following deficits and impairments:  Abnormal gait, Decreased activity tolerance, Decreased balance, Decreased cognition, Decreased mobility, Decreased endurance, Decreased coordination, Decreased range of motion, Decreased strength, Dizziness, Difficulty walking, Impaired sensation, Impaired tone, Impaired UE functional use, Postural dysfunction, Pain  Visit Diagnosis: Muscle weakness (generalized)  Unsteadiness on feet  Other abnormalities of gait and mobility  Other lack of coordination  Difficulty in walking, not elsewhere classified     Problem List Patient Active Problem List   Diagnosis Date Noted  . Incomplete paraplegia ([place]) 11/08/2019  . Foot drop, bilateral 11/08/2019  . Spasticity 08/18/2019  . Chronic pain syndrome 08/18/2019  . Nerve pain 08/18/2019  . Clonus 07/21/2019  . Spinal cord tumor   . Pain   . Transaminitis   . Leukocytosis   . Neurogenic bowel   . Postoperative pain   . 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 Acute incomplete quadriplegia ([facility])   . 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 Numbness 05/10/2019  . Weakness of left lower extremity 05/10/2019  . 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 Visual disturbance 05/10/2019    Timotheus Salm April Ma L Nashay Brickley PT, DPT 11/13/2019, 6:01 PM  [facility] [address] [place], [place], [address] Phone: [phone]   Fax:  [phone]  Name: Jacob Lambert MRN: [id number] Date of Birth: 12-09-1985

## 2019-11-13 NOTE — Therapy (Signed)
[facility] [address] [place], [place], [address] Phone: [phone]   Fax:  [phone]  Occupational Therapy Treatment  Patient Details  Name: Jacob Lambert MRN: [id number] Date of Birth: Mar 16, 1985 Referring Provider (OT): Dr. Courtney Heys   Encounter Date: 11/13/2019   OT End of Session - 11/13/19 1618    Visit Number 11    Number of Visits 25    Date for OT Re-Evaluation 01/01/20    Authorization Time Period 30 visit limit - week 6 of 12 (11/8)    OT Start Time 1617    OT Stop Time 1700    OT Time Calculation (min) 43 min    Activity Tolerance Patient tolerated treatment well    Behavior During Therapy [facility] for tasks assessed/performed           Past Medical History:  Diagnosis Date  . Anxiety   . Clonus   . Depression   . Headache   . Pneumonia    "walking"  . Quadriplegia and quadriparesis ([place])   . Spasticity   . Spinal cord tumor   . Wheelchair bound     Past Surgical History:  Procedure Laterality Date  . 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 NO PAST SURGERIES      There were no vitals filed for this visit.   Subjective Assessment - 11/13/19 1620    Subjective  My left leg is acting up today.    Pertinent History PMH depression    Limitations Fall Risk.    Patient Stated Goals "I want to have as much functional independence as possible" more dexterity and typing skills for work, wear tights and wear heels    Currently in Pain? Yes    Pain Score 6     Pain Location Leg    Pain Orientation Left    Pain Descriptors / Indicators Heaviness;Pressure    Pain Type Acute pain    Pain Onset More than a month ago    Pain Frequency Intermittent    Pain Score 5    Pain Location Finger (Comment which  one)    Pain Orientation Left    Pain Descriptors / Indicators Numbness;Burning    Pain Type Neuropathic pain    Pain Onset Today    Pain Frequency Intermittent    Aggravating Factors  medicine balls in hand manipulatoin in LUE            Treatment:  Eduction and review of equipment and adaptive strategies for travel to upcoming trip to [place]. Pt is going to use condom catheters on his trip for travel and when bathrooms aren't readily available with handicap accessibility. Pt is concerned with forecast of rain while traveling with power wheelchair. Discussed some options (poncho, umbrella clamp ,etc) for trip. Also discussed concealing catheter bag with clothing and clothing options.   Stacking coins of 5 and in hand translation to place in piggy bank  In hand manipulation with chinese medicine balls in RUE and then in LUE  [employer] - pick up with LUE one at a time until hand is full and then translating one at a time to fingertips and placing in [employer]. Repeat with RUE  Resistance Clothespins with LUE 1-8# and placing on elevated surface of antenna and removing with RUE  Arm Bike level 4 for 7 minutes                        OT Short Term Goals - 11/10/19 1413      OT SHORT TERM GOAL #1   Title Pt will be independent with HEP for coordination and strengthening 11/06/19    Time 4    Period Weeks    Status On-going   yellow theraputty 10/8   Target Date 11/06/19      OT SHORT TERM GOAL #2   Title Pt will increase grip strength in BUE by 5 lbs in order to increase ability to manage clothing (tights, socks, pants) and increased independence with ADLs.    Baseline RUE 33.2 lbs LUE 35.4 lbs    Time 4    Period Weeks    Status Achieved   44.3 LUE; 52 RUE     OT SHORT TERM GOAL #3   Title Pt will improve functional use of LUE as evidenced by performing 50 blocks or more on Box & Blocks test.    Baseline LUE 43 blocks    Time 4    Period Weeks      Status Achieved   70     OT SHORT TERM GOAL #4   Title Pt will perform complex meal prep and/or housekeeping tasks with mod I and showing good safety awareness in order to increase independence with IADLs.    Time 4    Period Weeks    Status New      OT SHORT TERM GOAL #5   Title Pt will verbalize compensatory strategies for visual deficits.    Time 4    Period Weeks    Status Achieved      OT SHORT TERM GOAL #6   Title Pt will improve 9 hole peg test score to 33 seconds or less with BUE in order to increase fine motor coordination in order to increase independence with fasteners and jewelry, etc.    Time 4    Period Weeks    Status Achieved   LUE 27.09 seconds RUE 30.06 seconds     OT SHORT TERM GOAL #7   Title Pt will obtain a lightweight object from higher level cabinet with LUE, dominant hand, for increasing independence with ADLs and IADLs.    Time 4    Period Weeks    Status Achieved      OT SHORT TERM GOAL #8   Title Pt will increase keyboarding/typing skills to completing 30 wpm in order to prep for back to work.    Time 4    Period Weeks    Status Achieved   43 wpm            OT Long Term Goals - 11/13/19 1647      OT LONG TERM GOAL #1   Title Pt will be independent with updated HEP for coordination and strengthening 01/02/20    Time 12    Period Weeks    Status New      OT LONG TERM GOAL #2   Title Pt will increase grip strength in BUE by 10 lbs in order to increase ability to manage clothing (tights, socks, pants) and increased independence with ADLs.    Baseline LUE 35.4 RUE 33.2    Time 12    Period Weeks    Status New      OT LONG TERM GOAL #3   Title  Pt will improve functional use of LUE as evidenced by performing 60 blocks or more on Box & Blocks test.    Baseline LUE 43    Time 12    Period Weeks    Status Achieved   70     OT LONG TERM GOAL #4   Title Pt will improve 9 hole peg test score to 25 seconds or less with BUE in order to  increase fine motor coordination in order to increase independence with fasteners and jewelry, etc.    Baseline RUE 37.84 LUE 38.57    Time 12    Period Weeks    Status New      OT LONG TERM GOAL #5   Title Pt will obtain an object from cabinet greater than or equal to 5lbs with LUE, dominant hand, for increasing independence with ADLs and IADLs.    Time 12    Period Weeks    Status New      OT LONG TERM GOAL #6   Title Pt will increase keyboarding/typing skills to completing 50 wpm in order to prep for back to work.    Time 12    Period Weeks    Status Achieved                 Plan - 11/13/19 1622    Clinical Impression Statement Pt is progressing towards goals. Pt continues to work towards increasing fine motor coorindation and strength in BUE and overall standing balance and endurance for self-care and IADLs.    OT Occupational Profile and History Detailed Assessment- Review of Records and additional review of physical, cognitive, psychosocial history related to current functional performance    Occupational performance deficits (Please refer to evaluation for details): IADL's;ADL's;Work;Social Participation;Leisure    Body Structure / Function / Physical Skills Strength;Gait;Decreased knowledge of use of DME;ADL;Balance;Dexterity;GMC;Pain;Tone;Body mechanics;UE functional use;Endurance;IADL;ROM;Vision;Coordination;Flexibility;Mobility;Sensation;FMC    Rehab Potential Good    Clinical Decision Making Limited treatment options, no task modification necessary    Comorbidities Affecting Occupational Performance: May have comorbidities impacting occupational performance    Modification or Assistance to Complete Evaluation  No modification of tasks or assist necessary to complete eval    OT Frequency 2x / week    OT Duration 12 weeks    OT Treatment/Interventions Self-care/ADL training;Moist Heat;Fluidtherapy;DME and/or AE instruction;Balance training;Therapeutic activities;Gait  Training;Ultrasound;Therapeutic exercise;Scar mobilization;Visual/perceptual remediation/compensation;Passive range of motion;Functional Mobility Training;Neuromuscular education;Electrical Stimulation;Paraffin;Energy conservation;Manual Therapy;Patient/family education    Plan standing balance, home management, arm bike    Consulted and Agree with Plan of Care Patient           Patient will benefit from skilled therapeutic intervention in order to improve the following deficits and impairments:   Body Structure / Function / Physical Skills: Strength, Gait, Decreased knowledge of use of DME, ADL, Balance, Dexterity, GMC, Pain, Tone, Body mechanics, UE functional use, Endurance, IADL, ROM, Vision, Coordination, Flexibility, Mobility, Sensation, [facility]       Visit Diagnosis: Muscle weakness (generalized)  Other lack of coordination  Unsteadiness on feet  Other abnormalities of gait and mobility  Other disturbances of skin sensation    Problem List Patient Active Problem List   Diagnosis Date Noted  . Incomplete paraplegia ([place]) 11/08/2019  . Foot drop, bilateral 11/08/2019  . Spasticity 08/18/2019  . Chronic pain syndrome 08/18/2019  . Nerve pain 08/18/2019  . Clonus 07/21/2019  . Spinal cord tumor   . Pain   . Transaminitis   . Leukocytosis   . Neurogenic  bowel   . Postoperative pain   . 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 Acute incomplete quadriplegia ([facility])   . 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 Numbness 05/10/2019  . Weakness of left lower extremity 05/10/2019  . 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 Visual disturbance 05/10/2019    Zachery Conch MOT, OTR/L  11/13/2019, 5:53 PM  [facility] [address] [place], [place], [address] Phone: [phone]   Fax:  [phone]  Name: Jacob Lambert MRN: [id number] Date of Birth: 08/15/1985

## 2019-11-15 ENCOUNTER — Ambulatory Visit: Payer: BC Managed Care – PPO | Admitting: Occupational Therapy

## 2019-11-15 ENCOUNTER — Ambulatory Visit: Payer: BC Managed Care – PPO | Admitting: Physical Therapy

## 2019-11-15 ENCOUNTER — Encounter: Payer: Self-pay | Admitting: Occupational Therapy

## 2019-11-15 ENCOUNTER — Other Ambulatory Visit: Payer: Self-pay

## 2019-11-15 DIAGNOSIS — R2689 Other abnormalities of gait and mobility: Secondary | ICD-10-CM | POA: Diagnosis not present

## 2019-11-15 DIAGNOSIS — R278 Other lack of coordination: Secondary | ICD-10-CM | POA: Diagnosis not present

## 2019-11-15 DIAGNOSIS — D497 Neoplasm of unspecified behavior of endocrine glands and other parts of nervous system: Secondary | ICD-10-CM | POA: Diagnosis not present

## 2019-11-15 DIAGNOSIS — R208 Other disturbances of skin sensation: Secondary | ICD-10-CM | POA: Diagnosis not present

## 2019-11-15 DIAGNOSIS — R262 Difficulty in walking, not elsewhere classified: Secondary | ICD-10-CM

## 2019-11-15 DIAGNOSIS — R2681 Unsteadiness on feet: Secondary | ICD-10-CM | POA: Diagnosis not present

## 2019-11-15 DIAGNOSIS — M6281 Muscle weakness (generalized): Secondary | ICD-10-CM

## 2019-11-15 DIAGNOSIS — R41842 Visuospatial deficit: Secondary | ICD-10-CM | POA: Diagnosis not present

## 2019-11-15 DIAGNOSIS — R293 Abnormal posture: Secondary | ICD-10-CM | POA: Diagnosis not present

## 2019-11-15 DIAGNOSIS — R52 Pain, unspecified: Secondary | ICD-10-CM | POA: Diagnosis not present

## 2019-11-15 NOTE — Therapy (Signed)
[facility] [address] [place], [place], [address] Phone: [phone]   Fax:  [phone]  Occupational Therapy Treatment  Patient Details  Name: Jacob Lambert MRN: [id number] Date of Birth: 28-Nov-1985 Referring Provider (OT): Dr. Courtney Heys   Encounter Date: 11/15/2019   OT End of Session - 11/15/19 1536    Visit Number 12    Number of Visits 25    Date for OT Re-Evaluation 01/01/20    Authorization Time Period 30 visit limit - week 6 of 12 (11/8)    OT Start Time 1535    OT Stop Time 1615    OT Time Calculation (min) 40 min    Activity Tolerance Patient tolerated treatment well    Behavior During Therapy [facility] for tasks assessed/performed           Past Medical History:  Diagnosis Date  . Anxiety   . Clonus   . Depression   . Headache   . Pneumonia    "walking"  . Quadriplegia and quadriparesis ([place])   . Spasticity   . Spinal cord tumor   . Wheelchair bound     Past Surgical History:  Procedure Laterality Date  . LAMINECTOMY N/A 06/01/2019   Procedure: Cervical six-seven Laminoplasty, biopsy and resection of intramedullary spinal cord tumor;  Surgeon: [name], MD;  Location: [facility];  Service: Neurosurgery;  Laterality: N/A;  . NO PAST SURGERIES      There were no vitals filed for this visit.   Subjective Assessment - 11/15/19 1537    Subjective  "other than what PT created, we are at the regular" (in regards to pain). Pt is going to [facility] this week.    Pertinent History PMH depression    Limitations Fall Risk.    Patient Stated Goals "I want to have as much functional independence as possible" more dexterity and typing skills for work, wear tights and wear heels    Currently in Pain? No/denies           Treatment  Education energy conservation strategies for IADLs and increasing seated for tasks that are able to be completed in sitting (I.e. prepping and chopping vegetables).     Upcoming trip to [place] education and problem solving with mobility, transportation to/from airport and Training and development officer. Condom catheter management.  Kitchen mobility and adaptive equipment education for increasing independent and complexity of meals (I.e. vegetable chopper, electric can opener, etc)  Grip strength Digitflex 5 lbs  X 10 reps   Arm Bike 6 minutes 3 forward, 3 backward on level 4                       OT Short Term Goals - 11/10/19 1413      OT SHORT TERM GOAL #1   Title Pt will be independent with HEP for coordination and strengthening 11/06/19    Time 4    Period Weeks    Status On-going   yellow theraputty 10/8   Target Date 11/06/19      OT SHORT TERM GOAL #2   Title Pt will increase grip strength in BUE by 5 lbs in order to increase ability to manage clothing (tights, socks, pants) and increased independence with ADLs.    Baseline RUE 33.2 lbs LUE 35.4 lbs    Time 4    Period Weeks    Status Achieved   44.3 LUE; 52 RUE     OT  SHORT TERM GOAL #3   Title Pt will improve functional use of LUE as evidenced by performing 50 blocks or more on Box & Blocks test.    Baseline LUE 43 blocks    Time 4    Period Weeks    Status Achieved   70     OT SHORT TERM GOAL #4   Title Pt will perform complex meal prep and/or housekeeping tasks with mod I and showing good safety awareness in order to increase independence with IADLs.    Time 4    Period Weeks    Status New      OT SHORT TERM GOAL #5   Title Pt will verbalize compensatory strategies for visual deficits.    Time 4    Period Weeks    Status Achieved      OT SHORT TERM GOAL #6   Title Pt will improve 9 hole peg test score to 33 seconds or less with BUE in order to increase fine motor coordination in order to increase independence with fasteners and jewelry, etc.    Time 4    Period Weeks    Status Achieved   LUE 27.09 seconds RUE 30.06 seconds     OT SHORT TERM GOAL #7    Title Pt will obtain a lightweight object from higher level cabinet with LUE, dominant hand, for increasing independence with ADLs and IADLs.    Time 4    Period Weeks    Status Achieved      OT SHORT TERM GOAL #8   Title Pt will increase keyboarding/typing skills to completing 30 wpm in order to prep for back to work.    Time 4    Period Weeks    Status Achieved   43 wpm            OT Long Term Goals - 11/15/19 1616      OT LONG TERM GOAL #1   Title Pt will be independent with updated HEP for coordination and strengthening 01/02/20    Time 12    Period Weeks    Status New      OT LONG TERM GOAL #2   Title Pt will increase grip strength in BUE by 10 lbs in order to increase ability to manage clothing (tights, socks, pants) and increased independence with ADLs.    Baseline LUE 35.4 RUE 33.2    Time 12    Period Weeks    Status Achieved   RUE 55.9 LUE 45.8     OT LONG TERM GOAL #3   Title Pt will improve functional use of LUE as evidenced by performing 60 blocks or more on Box & Blocks test.    Baseline LUE 43    Time 12    Period Weeks    Status Achieved   70     OT LONG TERM GOAL #4   Title Pt will improve 9 hole peg test score to 25 seconds or less with BUE in order to increase fine motor coordination in order to increase independence with fasteners and jewelry, etc.    Baseline RUE 37.84 LUE 38.57    Time 12    Period Weeks    Status New      OT LONG TERM GOAL #5   Title Pt will obtain an object from cabinet greater than or equal to 5lbs with LUE, dominant hand, for increasing independence with ADLs and IADLs.    Time 12    Period  Weeks    Status New      OT LONG TERM GOAL #6   Title Pt will increase keyboarding/typing skills to completing 50 wpm in order to prep for back to work.    Time 12    Period Weeks    Status Achieved                  Patient will benefit from skilled therapeutic intervention in order to improve the following deficits and  impairments:           Visit Diagnosis: Muscle weakness (generalized)  Other lack of coordination  Unsteadiness on feet  Other abnormalities of gait and mobility  Visuospatial deficit    Problem List Patient Active Problem List   Diagnosis Date Noted  . Incomplete paraplegia ([place]) 11/08/2019  . Foot drop, bilateral 11/08/2019  . Spasticity 08/18/2019  . Chronic pain syndrome 08/18/2019  . Nerve pain 08/18/2019  . Clonus 07/21/2019  . Spinal cord tumor   . Pain   . Transaminitis   . Leukocytosis   . Neurogenic bowel   . Postoperative pain   . 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 Acute incomplete quadriplegia ([facility])   . 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 Numbness 05/10/2019  . Weakness of left lower extremity 05/10/2019  . 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 Visual disturbance 05/10/2019    Zachery Conch MOT, OTR/L  11/15/2019, 5:28 PM  [facility] [address] [place], [place], [address] Phone: [phone]   Fax:  [phone]  Name: Jacob Lambert MRN: [id number] Date of Birth: 1985-04-09

## 2019-11-15 NOTE — Therapy (Signed)
[facility] [address] [place], [place], [address] Phone: [phone]   Fax:  [phone]  Physical Therapy Treatment and Re-Evaluation  Patient Details   Name: Hoy Fallert MRN: [id number] Date of Birth: Feb 18, 1985 Referring Provider (PT): Dr. Courtney Heys   Encounter Date: 11/15/2019   PT End of Session - 11/15/19 1804    Visit Number 16    Number of Visits 33    Date for PT Re-Evaluation 01/10/20    Authorization Type BC[employer]    PT Start Time 1445    PT Stop Time 1535    PT Time Calculation (min) 50 min    Equipment Utilized During Treatment Gait belt    Activity Tolerance Patient tolerated treatment well;Patient limited by fatigue    Behavior During Therapy [facility] for tasks assessed/performed           Past Medical History:  Diagnosis Date  . Anxiety   . Clonus   . Depression   . Headache   . Pneumonia    "walking"  . Quadriplegia and quadriparesis ([place])   . Spasticity   . Spinal cord tumor   . Wheelchair bound     Past Surgical History:  Procedure Laterality Date  . LAMINECTOMY N/A 06/01/2019   Procedure: Cervical six-seven Laminoplasty, biopsy and resection of intramedullary spinal cord tumor;  Surgeon: [name], MD;  Location: [facility];  Service: Neurosurgery;  Laterality: N/A;  . NO PAST SURGERIES      There were no vitals filed for this visit.   Subjective Assessment - 11/15/19 1749    Subjective No new complaints noted. Pt is to go on his [place] trip at the end of this week.    Limitations Standing;Walking;House hold activities    How long can you sit comfortably? no issues    How long can you stand comfortably? <5 min    How long can you walk comfortably? <0.25 mile    Patient Stated Goals Progress walking    Currently in Pain? No/denies    Pain Onset More than a month ago              [facility] PT Assessment - 11/15/19 0001      Strength   Right Hip Flexion 4+/5     Right Hip ABduction 3+/5    Left Hip Flexion 4/5    Left Hip ABduction 3+/5    Right Knee Flexion 3+/5    Right Knee Extension 4+/5    Left Knee Flexion 3+/5    Left Knee Extension 4+/5      Transfers   Sit to Stand 5: Supervision;From chair/3-in-1;Without upper extremity assist    Stand to Sit 5: Supervision;With upper extremity assist;With armrests      Ambulation/Gait   Ambulation/Gait Yes    Ambulation/Gait Assistance 4: Min guard    Ambulation/Gait Assistance Details See 6 MWT    Assistive device Straight cane;Other (Comment)   Bilat GRAFOs   Gait Pattern Step-through pattern;Narrow base of support;Lateral trunk lean to right    Ambulation Surface Level;Indoor    Stairs Yes    Stairs Assistance 4: Min assist    Stairs Assistance Details (indicate cue type and reason) Assist for stability with descent; cueing to hold on to bilat rails. Increased L knee buckling with descent    Stair Management Technique Step to pattern   one rail on L during ascent; bilat rails on descent   Number of Stairs 4  Height of Stairs 6      6 Minute walk- Post Test   02 Sat (%RA) 100 %    Modified Borg Scale for Dyspnea 3- Moderate shortness of breath or breathing difficulty   6/10 fatigue in bilat LEs     6 minute walk test results    Aerobic Endurance Distance Walked 672    Endurance additional comments with SPC and bilat GRAFO, no rest breaks      Berg Balance Test   Sit to Stand Able to stand without using hands and stabilize independently    Standing Unsupported Able to stand safely 2 minutes    Sitting with Back Unsupported but Feet Supported on Floor or Stool Able to sit safely and securely 2 minutes    Stand to Sit Sits safely with minimal use of hands    Transfers Able to transfer safely, minor use of hands    Standing Unsupported with Eyes Closed Able to stand 10 seconds safely    Standing Unsupported with Feet Together Able to place feet together independently and stand 1 minute  safely    From Standing, Reach Forward with Outstretched Arm Can reach forward >12 cm safely (5")    From Standing Position, Pick up Object from Floor Able to pick up shoe, needs supervision    From Standing Position, Turn to Look Behind Over each Shoulder Looks behind from both sides and weight shifts well    Turn 360 Degrees Able to turn 360 degrees safely but slowly    Standing Unsupported, Alternately Place Feet on Step/Stool Able to stand independently and complete 8 steps >20 seconds    Standing Unsupported, One Foot in Front Able to plae foot ahead of the other independently and hold 30 seconds    Standing on One Leg Tries to lift leg/unable to hold 3 seconds but remains standing independently    Total Score 47      Timed Up and Go Test   Normal TUG (seconds) 13.5   with SPC and GRAFO                                PT Education - 11/15/19 1750    Education Details Discussed current goals and his progress. Discussed revising goals accordingly with pt.    Person(s) Educated Patient    Methods Explanation;Demonstration    Comprehension Verbalized understanding;Verbal cues required            PT Short Term Goals - 11/15/19 1759      PT SHORT TERM GOAL #1   Title Patient will be able to ambulate 115' with cane to improve short distance ambulation within home    Baseline 115' with min/mod A with quad cane due to multi LOB and scissoring 25% (10/13/19); 672' with cane and bilat GRAFOs (11/15/19)    Time 4    Period Weeks    Status Achieved    Target Date 10/18/19      PT SHORT TERM GOAL #2   Title Pt will demo <12 seconds on timed up and go with RW to improve functional mobility (target date 01/10/20)    Baseline 14 seconds RW (eval); 13 seconds rollator (10/13/19); 13.5 seconds SPC + bilat GRAFOs (11/15/19)    Time 8    Period Weeks    Status Revised    Target Date 01/10/20      PT SHORT TERM GOAL #3   Title  Patient will be able to go up and down a ramp  and curb step to be able to negotiate community    Baseline CGA with rollator for 3' long ramp, forward and over aerobic stepper acting as curb x10 (10/13/19)    Time 4    Period Weeks    Status Achieved    Target Date 10/18/19             PT Long Term Goals - 11/15/19 1800      PT LONG TERM GOAL #1   Title Patient will demo >50/56 on berg balance scale to improve functional balance and reduce fall risk (Target date 01/10/20)    Baseline 40/56 (eval); 47/56 (11/15/19)    Time 8    Period Weeks    Status Revised    Target Date 01/10/20      PT LONG TERM GOAL #2   Title Patient will be able to ambulate >500 feet with cane to improve short distance community ambulation.    Baseline only walks with RW; 672' on 6 MWT with cane + GRAFO (11/15/19)    Time 8    Period Weeks    Status Achieved      PT LONG TERM GOAL #3   Title Patient will demo 4+/5 strength in bil LE grossly to improve functional strength with transfers (Target date 01/10/20)    Baseline 3+-4/5 grossly in bil LE    Time 8    Period Weeks    Status Revised    Target Date 01/10/20      PT LONG TERM GOAL #4   Title Patient will be able to go up and down 12 steps with use of 1-2 rails to be able to go up and down steps at home (Target date 01/10/20)    Baseline 2 steps (with one rail) (Eval); 4 steps, but very unsafe with descent (11/15/19)    Time 8    Period Weeks    Status Revised    Target Date 01/10/20      PT LONG TERM GOAL #5   Title Pt with goal to improve 6 MWT using rollator and GRAFOs to >/= 1000' or using SPC and GRAFOs to >/=800' (Target date 01/10/20)    Baseline 672' with [facility] & GRAFOs (11/15/19)    Time 8    Period Weeks    Status Revised    Target Date 01/10/20                 Plan - 11/15/19 1751    Clinical Impression Statement Treatment focused on re-assessing pt's current goals and revising as needed. Pt with improved Berg Score but has not yet met goal and has improved to a moderate fall  risk. Pt has met STG #1 and LTG #2. Pt able to amb 672' in 6 MWT demonstrating pt is able to amb at least community distance with his SPC and GRAFOs. In terms of strength, pt remains most weak with bilat knee flexion and bilat hip abduction. Pt is highly unsteady with stair descent likely due to decreased strength. Discussed and revised goals with pt. Pt states he ultimately would love to be able to walk 1/2 a mile in the long run; however, he understands that this may not be realistic in the next weeks of therapy.    Personal Factors and Comorbidities Age;Past/Current Experience;Comorbidity 2;Time since onset of injury/illness/exacerbation;Transportation    Examination-Activity Limitations Bathing;Lift;Squat;Stairs;Stand;Transfers    Examination-Participation Restrictions Cleaning;Community Activity;Driving;Laundry;Occupation;Shop;Yard Work  Rehab Potential Fair    PT Frequency 2x / week    PT Duration 8 weeks    PT Treatment/Interventions ADLs/Self Care Home Management;Gait training;Stair training;Functional mobility training;Therapeutic activities;Therapeutic exercise;Balance training;Neuromuscular re-education;Manual techniques;Wheelchair mobility training;Patient/family education;Passive range of motion;Energy conservation;Vestibular;Visual/perceptual remediation/compensation;Joint Manipulations    PT Next Visit Plan Work on R hip abduction strengthening, knee flex and extension strengthening. Continue to work on endurance. Work on Engineer, drilling and backwards movement with amb.    PT Home Exercise Plan Access Code: [email or web address]; [email or web address]    Consulted and Agree with Plan of Care Patient           Patient will benefit from skilled therapeutic intervention in order to improve the following deficits and impairments:  Abnormal gait, Decreased activity tolerance, Decreased balance, Decreased cognition, Decreased mobility, Decreased endurance, Decreased coordination, Decreased range of  motion, Decreased strength, Dizziness, Difficulty walking, Impaired sensation, Impaired tone, Impaired UE functional use, Postural dysfunction, Pain  Visit Diagnosis: Muscle weakness (generalized)  Other lack of coordination  Unsteadiness on feet  Other abnormalities of gait and mobility  Other disturbances of skin sensation  Difficulty in walking, not elsewhere classified     Problem List Patient Active Problem List   Diagnosis Date Noted  . Incomplete paraplegia ([place]) 11/08/2019  . Foot drop, bilateral 11/08/2019  . Spasticity 08/18/2019  . Chronic pain syndrome 08/18/2019  . Nerve pain 08/18/2019  . Clonus 07/21/2019  . Spinal cord tumor   . Pain   . Transaminitis   . Leukocytosis   . Neurogenic bowel   . Postoperative pain   . 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 Acute incomplete quadriplegia ([facility])   . 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 Numbness 05/10/2019  . Weakness of left lower extremity 05/10/2019  . 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 Visual disturbance 05/10/2019    Keyaira Clapham April Ma L Maylene Crocker PT, DPT 11/15/2019, 6:08 PM  [facility] [address] [place], [place], [address] Phone: [phone]   Fax:  [phone]  Name: Dio Giller MRN: [id number] Date of Birth: [birth year]

## 2019-11-21 ENCOUNTER — Ambulatory Visit (HOSPITAL_COMMUNITY): Payer: BC Managed Care – PPO

## 2019-11-23 ENCOUNTER — Other Ambulatory Visit: Payer: Self-pay

## 2019-11-23 ENCOUNTER — Ambulatory Visit: Payer: BC Managed Care – PPO | Admitting: Occupational Therapy

## 2019-11-23 ENCOUNTER — Ambulatory Visit: Payer: BC Managed Care – PPO | Admitting: Physical Therapy

## 2019-11-23 ENCOUNTER — Encounter: Payer: Self-pay | Admitting: Physical Therapy

## 2019-11-23 DIAGNOSIS — R41842 Visuospatial deficit: Secondary | ICD-10-CM | POA: Diagnosis not present

## 2019-11-23 DIAGNOSIS — R2689 Other abnormalities of gait and mobility: Secondary | ICD-10-CM | POA: Diagnosis not present

## 2019-11-23 DIAGNOSIS — R262 Difficulty in walking, not elsewhere classified: Secondary | ICD-10-CM | POA: Diagnosis not present

## 2019-11-23 DIAGNOSIS — M6281 Muscle weakness (generalized): Secondary | ICD-10-CM

## 2019-11-23 DIAGNOSIS — R52 Pain, unspecified: Secondary | ICD-10-CM | POA: Diagnosis not present

## 2019-11-23 DIAGNOSIS — R208 Other disturbances of skin sensation: Secondary | ICD-10-CM

## 2019-11-23 DIAGNOSIS — R2681 Unsteadiness on feet: Secondary | ICD-10-CM | POA: Diagnosis not present

## 2019-11-23 DIAGNOSIS — R293 Abnormal posture: Secondary | ICD-10-CM | POA: Diagnosis not present

## 2019-11-23 DIAGNOSIS — R278 Other lack of coordination: Secondary | ICD-10-CM

## 2019-11-23 DIAGNOSIS — D497 Neoplasm of unspecified behavior of endocrine glands and other parts of nervous system: Secondary | ICD-10-CM | POA: Diagnosis not present

## 2019-11-23 NOTE — Therapy (Signed)
[facility] [address] [place], [place], [address] Phone: [phone]   Fax:  [phone]  Occupational Therapy Treatment  Patient Details  Name: Jacob Lambert MRN: [id number] Date of Birth: [birth year] Referring Provider (OT): Dr. Courtney Heys   Encounter Date: 11/23/2019   OT End of Session - 11/23/19 1452    Visit Number 13    Number of Visits 25    Date for OT Re-Evaluation 01/01/20    Authorization Time Period 30 visit limit - week 7 of 12 (11/18)    OT Start Time 1448    OT Stop Time 1527    OT Time Calculation (min) 39 min    Activity Tolerance Patient tolerated treatment well    Behavior During Therapy [facility] for tasks assessed/performed           Past Medical History:  Diagnosis Date  . Anxiety   . Clonus   . Depression   . Headache   . Pneumonia    "walking"  . Quadriplegia and quadriparesis ([place])   . Spasticity   . Spinal cord tumor   . Wheelchair bound     Past Surgical History:  Procedure Laterality Date  . LAMINECTOMY N/A 06/01/2019   Procedure: Cervical six-seven Laminoplasty, biopsy and resection of intramedullary spinal cord tumor;  Surgeon: [name], MD;  Location: [facility];  Service: Neurosurgery;  Laterality: N/A;  . NO PAST SURGERIES      There were no vitals filed for this visit.   Subjective Assessment - 11/23/19 1453    Subjective  "Gokul never wants to fly again" "I have a manual chair but we are purchasing a power wheelchair for travel purposes. " Pt reported falls ("a few times") while on their trip to [facility]    Pertinent History PMH depression    Limitations Fall Risk.    Patient Stated Goals "I want to have as much functional independence as possible" more dexterity and typing skills for work, wear tights and wear heels    Currently in Pain? No/denies              Treatment:  Pt returns to therapy after first trip to [facility]. Pt reports difficulties with  airplane and air travel. Pt let therapist know of difficulties with transportation and community mobility with power chair and manual chair.  Pt reports diplopia with watching live shows secondary to the lights. Pt reports even diplopia with R eye occluded.   Small Pegboard design with LUE and then with RUE for coordination. Min drops and increased time. Pt removed with in hand manipulation with palm to fingertip translation with 3 pegs in palm and bringing one at a time.                   OT Short Term Goals - 11/10/19 1413      OT SHORT TERM GOAL #1   Title Pt will be independent with HEP for coordination and strengthening 11/06/19    Time 4    Period Weeks    Status On-going   yellow theraputty 10/8   Target Date 11/06/19      OT SHORT TERM GOAL #2   Title Pt will increase grip strength in BUE by 5 lbs in order to increase ability to manage clothing (tights, socks, pants) and increased independence with ADLs.    Baseline RUE 33.2 lbs LUE 35.4 lbs    Time 4    Period Weeks  Status Achieved   44.3 LUE; 52 RUE     OT SHORT TERM GOAL #3   Title Pt will improve functional use of LUE as evidenced by performing 50 blocks or more on Box & Blocks test.    Baseline LUE 43 blocks    Time 4    Period Weeks    Status Achieved   70     OT SHORT TERM GOAL #4   Title Pt will perform complex meal prep and/or housekeeping tasks with mod I and showing good safety awareness in order to increase independence with IADLs.    Time 4    Period Weeks    Status New      OT SHORT TERM GOAL #5   Title Pt will verbalize compensatory strategies for visual deficits.    Time 4    Period Weeks    Status Achieved      OT SHORT TERM GOAL #6   Title Pt will improve 9 hole peg test score to 33 seconds or less with BUE in order to increase fine motor coordination in order to increase independence with fasteners and jewelry, etc.    Time 4    Period Weeks    Status Achieved   LUE 27.09 seconds  RUE 30.06 seconds     OT SHORT TERM GOAL #7   Title Pt will obtain a lightweight object from higher level cabinet with LUE, dominant hand, for increasing independence with ADLs and IADLs.    Time 4    Period Weeks    Status Achieved      OT SHORT TERM GOAL #8   Title Pt will increase keyboarding/typing skills to completing 30 wpm in order to prep for back to work.    Time 4    Period Weeks    Status Achieved   43 wpm            OT Long Term Goals - 11/15/19 1616      OT LONG TERM GOAL #1   Title Pt will be independent with updated HEP for coordination and strengthening 01/02/20    Time 12    Period Weeks    Status New      OT LONG TERM GOAL #2   Title Pt will increase grip strength in BUE by 10 lbs in order to increase ability to manage clothing (tights, socks, pants) and increased independence with ADLs.    Baseline LUE 35.4 RUE 33.2    Time 12    Period Weeks    Status Achieved   RUE 55.9 LUE 45.8     OT LONG TERM GOAL #3   Title Pt will improve functional use of LUE as evidenced by performing 60 blocks or more on Box & Blocks test.    Baseline LUE 43    Time 12    Period Weeks    Status Achieved   70     OT LONG TERM GOAL #4   Title Pt will improve 9 hole peg test score to 25 seconds or less with BUE in order to increase fine motor coordination in order to increase independence with fasteners and jewelry, etc.    Baseline RUE 37.84 LUE 38.57    Time 12    Period Weeks    Status New      OT LONG TERM GOAL #5   Title Pt will obtain an object from cabinet greater than or equal to 5lbs with LUE, dominant hand, for increasing independence  with ADLs and IADLs.    Time 12    Period Weeks    Status New      OT LONG TERM GOAL #6   Title Pt will increase keyboarding/typing skills to completing 50 wpm in order to prep for back to work.    Time 12    Period Weeks    Status Achieved                 Plan - 11/23/19 1452    Clinical Impression Statement Pt  demonstrating continued difficulty with coordination and strength and endurance. Pt is progressing towards goals.    OT Occupational Profile and History Detailed Assessment- Review of Records and additional review of physical, cognitive, psychosocial history related to current functional performance    Occupational performance deficits (Please refer to evaluation for details): IADL's;ADL's;Work;Social Participation;Leisure    Body Structure / Function / Physical Skills Strength;Gait;Decreased knowledge of use of DME;ADL;Balance;Dexterity;GMC;Pain;Tone;Body mechanics;UE functional use;Endurance;IADL;ROM;Vision;Coordination;Flexibility;Mobility;Sensation;FMC    Rehab Potential Good    Clinical Decision Making Limited treatment options, no task modification necessary    Comorbidities Affecting Occupational Performance: May have comorbidities impacting occupational performance    Modification or Assistance to Complete Evaluation  No modification of tasks or assist necessary to complete eval    OT Frequency 2x / week    OT Duration 12 weeks    OT Treatment/Interventions Self-care/ADL training;Moist Heat;Fluidtherapy;DME and/or AE instruction;Balance training;Therapeutic activities;Gait Training;Ultrasound;Therapeutic exercise;Scar mobilization;Visual/perceptual remediation/compensation;Passive range of motion;Functional Mobility Training;Neuromuscular education;Electrical Stimulation;Paraffin;Energy conservation;Manual Therapy;Patient/family education    Plan standing balance, home management, arm bike    Consulted and Agree with Plan of Care Patient           Patient will benefit from skilled therapeutic intervention in order to improve the following deficits and impairments:   Body Structure / Function / Physical Skills: Strength, Gait, Decreased knowledge of use of DME, ADL, Balance, Dexterity, GMC, Pain, Tone, Body mechanics, UE functional use, Endurance, IADL, ROM, Vision, Coordination,  Flexibility, Mobility, Sensation, [facility]       Visit Diagnosis: Muscle weakness (generalized)  Other lack of coordination  Visuospatial deficit  Other disturbances of skin sensation    Problem List Patient Active Problem List   Diagnosis Date Noted  . Incomplete paraplegia ([place]) 11/08/2019  . Foot drop, bilateral 11/08/2019  . Spasticity 08/18/2019  . Chronic pain syndrome 08/18/2019  . Nerve pain 08/18/2019  . Clonus 07/21/2019  . Spinal cord tumor   . Pain   . Transaminitis   . Leukocytosis   . Neurogenic bowel   . Postoperative pain   . 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 Acute incomplete quadriplegia ([facility])   . 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 Numbness 05/10/2019  . Weakness of left lower extremity 05/10/2019  . 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 Visual disturbance 05/10/2019    Zachery Conch MOT, OTR/L  11/23/2019, 5:15 PM  [facility] [address] [place], [place], [address] Phone: [phone]   Fax:  [phone]  Name: Jacob Lambert MRN: [id number] Date of Birth: Apr 18, 1985

## 2019-11-24 NOTE — Therapy (Addendum)
[facility] [address] [place], [place], [address] Phone: [phone]   Fax:  [phone]  Physical Therapy Treatment  Patient Details  Name: Jacob Lambert MRN: [id number] Date of Birth: 04/03/1985 Referring Provider (PT): Dr. Courtney Heys   Encounter Date: 11/23/2019   PT End of Session - 11/23/19 1547    Visit Number 17    Number of Visits 33    Date for PT Re-Evaluation 01/10/20    Authorization Type [employer]    PT Start Time 1532    PT Stop Time 1615    PT Time Calculation (min) 43 min    Equipment Utilized During Treatment Gait belt    Activity Tolerance Patient tolerated treatment well;Patient limited by fatigue    Behavior During Therapy [facility] for tasks assessed/performed           Past Medical History:  Diagnosis Date  . Anxiety   . Clonus   . Depression   . Headache   . Pneumonia    "walking"  . Quadriplegia and quadriparesis ([place])   . Spasticity   . Spinal cord tumor   . Wheelchair bound     Past Surgical History:  Procedure Laterality Date  . LAMINECTOMY N/A 06/01/2019   Procedure: Cervical six-seven Laminoplasty, biopsy and resection of intramedullary spinal cord tumor;  Surgeon: [name], MD;  Location: [place] [facility];  Service: Neurosurgery;  Laterality: N/A;  . NO PAST SURGERIES      There were no vitals filed for this visit.   Subjective Assessment - 11/23/19 1532    Subjective No new complaints except for his trip was "hell" with the flight due to multiple issues with lack of handicap assessable. [place] itself were okay. Had to use the manual chair more than intended in [place] due to rain and not able to use the rented electric chair. Had 5 falls with the trip. 1st fall- was in the manual wheelchair when the wheel caught uneven pavement and he fell forward. 2 of them were getting out of taxi's to get into the wheelchair. 4th fall was trying to open the suitcase which did take him to  the ground. He was trying to lift the case onto the bed and both went down. Was able to get himself up onto the bed.    Limitations Standing;Walking;House hold activities    How long can you sit comfortably? no issues    How long can you stand comfortably? <5 min    How long can you walk comfortably? <0.25 mile    Patient Stated Goals Progress walking    Currently in Pain? No/denies    Pain Score 0-No pain                11/23/19 1548  Transfers  Transfers Sit to Stand;Stand to Sit  Sit to Stand 5: Supervision;From chair/3-in-1;Without upper extremity assist  Stand to Sit 5: Supervision;With upper extremity assist;With armrests  Ambulation/Gait  Ambulation/Gait Yes  Ambulation/Gait Assistance 5: Supervision  Ambulation/Gait Assistance Details around gym wtih session.   Assistive device Rolling walker  Gait Pattern Step-through pattern;Narrow base of support;Lateral trunk lean to right  Ambulation Surface Level;Indoor  Self-Care  Self-Care Other Self-Care Comments  Other Self-Care Comments  discussed recent falls and strategies for preventing falls with travel. First fall could have been pevented if he had been wearing a seat belt in the manual chair when the front wheels caught uneven pavement. The next two falls occured  when transfering from a taxi to the wheelchair. With the 1st one the taxi was an SUV that was high, Discussed going out sideways so that one leg get on the ground 1st while he can hold onto the seat and the doorway, This would apply to any high vehicles. The 2cd one was due to the wheelchair being up on a curb and having to step and turn. Discussed use of folding cane as support with this. The next fall was when he tried to lift his suitcase out of a tight corner and lost balance. Discussed being seated to try this next time.   Exercises  Exercises Other Exercises  Other Exercises  standing at bottom of stairs: fwd step ups with marching, lateral step ups with  contralateral LE hovering for 10 reps each bil LE's; with red band resistance- long arc quads for 10 reps each leg. Hamstring curls wtih green band resistance for 10 reps bil LE's: with green band around knees- clamshell with 5 sec holds for 10 reps, then alternating marching for 10 reps bil LE's;  Standiing with UE support on sturdy surface: heel/toe raises with light UE support, min guard assist for balance.         PT Short Term Goals - 11/15/19 1759      PT SHORT TERM GOAL #1   Title Patient will be able to ambulate 40' with cane to improve short distance ambulation within home    Baseline 115' with min/mod A with quad cane due to multi LOB and scissoring 25% (10/13/19); 672' with cane and bilat GRAFOs (11/15/19)    Time 4    Period Weeks    Status Achieved    Target Date 10/18/19      PT SHORT TERM GOAL #2   Title Pt will demo <12 seconds on timed up and go with RW to improve functional mobility (target date 01/10/20)    Baseline 14 seconds RW (eval); 13 seconds rollator (10/13/19); 13.5 seconds SPC + bilat GRAFOs (11/15/19)    Time 8    Period Weeks    Status Revised    Target Date 01/10/20      PT SHORT TERM GOAL #3   Title Patient will be able to go up and down a ramp and curb step to be able to negotiate community    Baseline CGA with rollator for 3' long ramp, forward and over aerobic stepper acting as curb x10 (10/13/19)    Time 4    Period Weeks    Status Achieved    Target Date 10/18/19             PT Long Term Goals - 11/15/19 1800      PT LONG TERM GOAL #1   Title Patient will demo >50/56 on berg balance scale to improve functional balance and reduce fall risk (Target date 01/10/20)    Baseline 40/56 (eval); 47/56 (11/15/19)    Time 8    Period Weeks    Status Revised    Target Date 01/10/20      PT LONG TERM GOAL #2   Title Patient will be able to ambulate >500 feet with cane to improve short distance community ambulation.    Baseline only walks with RW;  672' on 6 MWT with cane + GRAFO (11/15/19)    Time 8    Period Weeks    Status Achieved      PT LONG TERM GOAL #3   Title Patient will demo 4+/5  strength in bil LE grossly to improve functional strength with transfers (Target date 01/10/20)    Baseline 3+-4/5 grossly in bil LE    Time 8    Period Weeks    Status Revised    Target Date 01/10/20      PT LONG TERM GOAL #4   Title Patient will be able to go up and down 12 steps with use of 1-2 rails to be able to go up and down steps at home (Target date 01/10/20)    Baseline 2 steps (with one rail) (Eval); 4 steps, but very unsafe with descent (11/15/19)    Time 8    Period Weeks    Status Revised    Target Date 01/10/20      PT LONG TERM GOAL #5   Title Pt with goal to improve 6 MWT using rollator and GRAFOs to >/= 1000' or using SPC and GRAFOs to >/=800' (Target date 01/10/20)    Baseline 672' with [facility] & GRAFOs (11/15/19)    Time 8    Period Weeks    Status Revised    Target Date 01/10/20                 Plan - 11/23/19 1548    Clinical Impression Statement Todays skilled session intitially focused on fall prevention strategies after pt's recent falls with travel. Remainder of session focused on LE strengthening with rest breaks taken as needed. The pt is interested in a power chair for when he travels, not sure that he will qualify. Will check with PT who does power chair evals. The pt is progressing toward goals and should benefit from continued PT to progress toward unmet goals.    Personal Factors and Comorbidities Age;Past/Current Experience;Comorbidity 2;Time since onset of injury/illness/exacerbation;Transportation    Examination-Activity Limitations Bathing;Lift;Squat;Stairs;Stand;Transfers    Examination-Participation Restrictions Cleaning;Community Activity;Driving;Laundry;Occupation;Shop;Yard Work    Rehab Potential Fair    PT Frequency 2x / week    PT Duration 8 weeks    PT Treatment/Interventions ADLs/Self Care  Home Management;Gait training;Stair training;Functional mobility training;Therapeutic activities;Therapeutic exercise;Balance training;Neuromuscular re-education;Manual techniques;Wheelchair mobility training;Patient/family education;Passive range of motion;Energy conservation;Vestibular;Visual/perceptual remediation/compensation;Joint Manipulations    PT Next Visit Plan Work on R hip abduction strengthening, knee flex and extension strengthening. Continue to work on endurance. Work on Engineer, drilling and backwards movement with amb.    PT Home Exercise Plan Access Code: [email or web address]    Consulted and Agree with Plan of Care Patient           Patient will benefit from skilled therapeutic intervention in order to improve the following deficits and impairments:  Abnormal gait, Decreased activity tolerance, Decreased balance, Decreased cognition, Decreased mobility, Decreased endurance, Decreased coordination, Decreased range of motion, Decreased strength, Dizziness, Difficulty walking, Impaired sensation, Impaired tone, Impaired UE functional use, Postural dysfunction, Pain  Visit Diagnosis: Muscle weakness (generalized)  Other lack of coordination  Unsteadiness on feet  Other abnormalities of gait and mobility     Problem List Patient Active Problem List   Diagnosis Date Noted  . Incomplete paraplegia ([place]) 11/08/2019  . Foot drop, bilateral 11/08/2019  . Spasticity 08/18/2019  . Chronic pain syndrome 08/18/2019  . Nerve pain 08/18/2019  . Clonus 07/21/2019  . Spinal cord tumor   . Pain   . Transaminitis   . Leukocytosis   . Neurogenic bowel   . Postoperative pain   . 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 Acute incomplete quadriplegia ([facility])   . 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  Numbness 05/10/2019  . Weakness of left lower extremity 05/10/2019  . 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 Visual disturbance 05/10/2019    Willow Ora, PTA, [facility] [address] [place], [place] [address] [phone] 11/24/19, 9:38 PM   Name: Axell Trigueros MRN: [id number] Date of Birth: June 05, 1985

## 2019-11-27 ENCOUNTER — Encounter: Payer: Self-pay | Admitting: Occupational Therapy

## 2019-11-27 ENCOUNTER — Ambulatory Visit: Payer: BC Managed Care – PPO | Admitting: Occupational Therapy

## 2019-11-27 ENCOUNTER — Other Ambulatory Visit: Payer: Self-pay

## 2019-11-27 ENCOUNTER — Ambulatory Visit: Payer: BC Managed Care – PPO

## 2019-11-27 DIAGNOSIS — R2689 Other abnormalities of gait and mobility: Secondary | ICD-10-CM

## 2019-11-27 DIAGNOSIS — R262 Difficulty in walking, not elsewhere classified: Secondary | ICD-10-CM

## 2019-11-27 DIAGNOSIS — M6281 Muscle weakness (generalized): Secondary | ICD-10-CM

## 2019-11-27 DIAGNOSIS — R2681 Unsteadiness on feet: Secondary | ICD-10-CM

## 2019-11-27 DIAGNOSIS — R278 Other lack of coordination: Secondary | ICD-10-CM

## 2019-11-27 DIAGNOSIS — R41842 Visuospatial deficit: Secondary | ICD-10-CM | POA: Diagnosis not present

## 2019-11-27 DIAGNOSIS — R52 Pain, unspecified: Secondary | ICD-10-CM | POA: Diagnosis not present

## 2019-11-27 DIAGNOSIS — R293 Abnormal posture: Secondary | ICD-10-CM

## 2019-11-27 DIAGNOSIS — R208 Other disturbances of skin sensation: Secondary | ICD-10-CM | POA: Diagnosis not present

## 2019-11-27 DIAGNOSIS — D497 Neoplasm of unspecified behavior of endocrine glands and other parts of nervous system: Secondary | ICD-10-CM

## 2019-11-27 NOTE — Therapy (Signed)
[facility] [address] [place], [place], [address] Phone: [phone]   Fax:  [phone]  Occupational Therapy Treatment  Patient Details  Name: Jacob Lambert MRN: [id number] Date of Birth: 1985-08-29 Referring Provider (OT): Dr. Courtney Heys   Encounter Date: 11/27/2019   OT End of Session - 11/27/19 1536    Visit Number 14    Number of Visits 25    Date for OT Re-Evaluation 01/01/20    Authorization Time Period 30 visit limit - week 8 of 12 (11/22)    OT Start Time 1534    OT Stop Time 1615    OT Time Calculation (min) 41 min    Activity Tolerance Patient tolerated treatment well    Behavior During Therapy [facility] for tasks assessed/performed           Past Medical History:  Diagnosis Date  . Anxiety   . Clonus   . Depression   . Headache   . Pneumonia    "walking"  . Quadriplegia and quadriparesis ([place])   . Spasticity   . Spinal cord tumor   . Wheelchair bound     Past Surgical History:  Procedure Laterality Date  . LAMINECTOMY N/A 06/01/2019   Procedure: Cervical six-seven Laminoplasty, biopsy and resection of intramedullary spinal cord tumor;  Surgeon: [name], MD;  Location: [place];  Service: Neurosurgery;  Laterality: N/A;  . NO PAST SURGERIES      There were no vitals filed for this visit.   Subjective Assessment - 11/27/19 1538    Subjective  "I went grocery shopping, went to a concert at an old folks home and my vision is shh"    Pertinent History PMH depression    Limitations Fall Risk.    Patient Stated Goals "I want to have as much functional independence as possible" more dexterity and typing skills for work, wear tights and wear heels    Currently in Pain? No/denies             Treatment:  Golf tees in board ad using plastic tweezers for placing small beads on top of golf tees. Pt utilized LUE for placing small beads with mod drops. Removed beads with RUE with  plastic tweezers with min drops.  Pt placed small beads on golf tees with LUE with no drops  Pt stringing beads/pegs with BUE with no drops and min increased time.  Digiflex 5.0 lbs x 10 w/ hold hand and individual digits BUE                       OT Short Term Goals - 11/10/19 1413      OT SHORT TERM GOAL #1   Title Pt will be independent with HEP for coordination and strengthening 11/06/19    Time 4    Period Weeks    Status On-going   yellow theraputty 10/8   Target Date 11/06/19      OT SHORT TERM GOAL #2   Title Pt will increase grip strength in BUE by 5 lbs in order to increase ability to manage clothing (tights, socks, pants) and increased independence with ADLs.    Baseline RUE 33.2 lbs LUE 35.4 lbs    Time 4    Period Weeks    Status Achieved   44.3 LUE; 52 RUE     OT SHORT TERM GOAL #3   Title Pt will improve functional use of LUE as evidenced by  performing 50 blocks or more on Box & Blocks test.    Baseline LUE 43 blocks    Time 4    Period Weeks    Status Achieved   70     OT SHORT TERM GOAL #4   Title Pt will perform complex meal prep and/or housekeeping tasks with mod I and showing good safety awareness in order to increase independence with IADLs.    Time 4    Period Weeks    Status New      OT SHORT TERM GOAL #5   Title Pt will verbalize compensatory strategies for visual deficits.    Time 4    Period Weeks    Status Achieved      OT SHORT TERM GOAL #6   Title Pt will improve 9 hole peg test score to 33 seconds or less with BUE in order to increase fine motor coordination in order to increase independence with fasteners and jewelry, etc.    Time 4    Period Weeks    Status Achieved   LUE 27.09 seconds RUE 30.06 seconds     OT SHORT TERM GOAL #7   Title Pt will obtain a lightweight object from higher level cabinet with LUE, dominant hand, for increasing independence with ADLs and IADLs.    Time 4    Period Weeks    Status Achieved       OT SHORT TERM GOAL #8   Title Pt will increase keyboarding/typing skills to completing 30 wpm in order to prep for back to work.    Time 4    Period Weeks    Status Achieved   43 wpm            OT Long Term Goals - 11/15/19 1616      OT LONG TERM GOAL #1   Title Pt will be independent with updated HEP for coordination and strengthening 01/02/20    Time 12    Period Weeks    Status New      OT LONG TERM GOAL #2   Title Pt will increase grip strength in BUE by 10 lbs in order to increase ability to manage clothing (tights, socks, pants) and increased independence with ADLs.    Baseline LUE 35.4 RUE 33.2    Time 12    Period Weeks    Status Achieved   RUE 55.9 LUE 45.8     OT LONG TERM GOAL #3   Title Pt will improve functional use of LUE as evidenced by performing 60 blocks or more on Box & Blocks test.    Baseline LUE 43    Time 12    Period Weeks    Status Achieved   70     OT LONG TERM GOAL #4   Title Pt will improve 9 hole peg test score to 25 seconds or less with BUE in order to increase fine motor coordination in order to increase independence with fasteners and jewelry, etc.    Baseline RUE 37.84 LUE 38.57    Time 12    Period Weeks    Status New      OT LONG TERM GOAL #5   Title Pt will obtain an object from cabinet greater than or equal to 5lbs with LUE, dominant hand, for increasing independence with ADLs and IADLs.    Time 12    Period Weeks    Status New      OT LONG TERM GOAL #6  Title Pt will increase keyboarding/typing skills to completing 50 wpm in order to prep for back to work.    Time 12    Period Weeks    Status Achieved                 Plan - 11/27/19 1554    Clinical Impression Statement Pt is progressing towards goals. Pt is making progress with fine motor coordination.    OT Occupational Profile and History Detailed Assessment- Review of Records and additional review of physical, cognitive, psychosocial history related to  current functional performance    Occupational performance deficits (Please refer to evaluation for details): IADL's;ADL's;Work;Social Participation;Leisure    Body Structure / Function / Physical Skills Strength;Gait;Decreased knowledge of use of DME;ADL;Balance;Dexterity;GMC;Pain;Tone;Body mechanics;UE functional use;Endurance;IADL;ROM;Vision;Coordination;Flexibility;Mobility;Sensation;FMC    Rehab Potential Good    Clinical Decision Making Limited treatment options, no task modification necessary    Comorbidities Affecting Occupational Performance: May have comorbidities impacting occupational performance    Modification or Assistance to Complete Evaluation  No modification of tasks or assist necessary to complete eval    OT Frequency 2x / week    OT Duration 12 weeks    OT Treatment/Interventions Self-care/ADL training;Moist Heat;Fluidtherapy;DME and/or AE instruction;Balance training;Therapeutic activities;Gait Training;Ultrasound;Therapeutic exercise;Scar mobilization;Visual/perceptual remediation/compensation;Passive range of motion;Functional Mobility Training;Neuromuscular education;Electrical Stimulation;Paraffin;Energy conservation;Manual Therapy;Patient/family education    Plan standing balance, home management, arm bike    Consulted and Agree with Plan of Care Patient           Patient will benefit from skilled therapeutic intervention in order to improve the following deficits and impairments:   Body Structure / Function / Physical Skills: Strength, Gait, Decreased knowledge of use of DME, ADL, Balance, Dexterity, GMC, Pain, Tone, Body mechanics, UE functional use, Endurance, IADL, ROM, Vision, Coordination, Flexibility, Mobility, Sensation, [facility]       Visit Diagnosis: Muscle weakness (generalized)  Other lack of coordination  Visuospatial deficit  Other disturbances of skin sensation    Problem List Patient Active Problem List   Diagnosis Date Noted  . Incomplete  paraplegia ([place]) 11/08/2019  . Foot drop, bilateral 11/08/2019  . Spasticity 08/18/2019  . Chronic pain syndrome 08/18/2019  . Nerve pain 08/18/2019  . Clonus 07/21/2019  . Spinal cord tumor   . Pain   . Transaminitis   . Leukocytosis   . Neurogenic bowel   . Postoperative pain   . 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 Acute incomplete quadriplegia ([facility])   . 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 Numbness 05/10/2019  . Weakness of left lower extremity 05/10/2019  . 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 Visual disturbance 05/10/2019    Zachery Conch MOT, OTR/L  11/27/2019, 4:15 PM  [facility] [address] [place], [place], [address] Phone: [phone]   Fax:  [phone]  Name: Haralambos Yeatts MRN: [id number] Date of Birth: Jan 25, 1985

## 2019-11-27 NOTE — Therapy (Signed)
[facility] [address] [place], [place], [address] Phone: [phone]   Fax:  [phone]  Physical Therapy Treatment  Patient Details  Name: Jacob Lambert MRN: [id number] Date of Birth: [birth year] Referring Provider (PT): Dr. Courtney Heys   Encounter Date: 11/27/2019   PT End of Session - 11/27/19 1648    Visit Number 18    Number of Visits 33    Date for PT Re-Evaluation 01/10/20    Authorization Type [employer]    PT Start Time 1615    PT Stop Time 1700    PT Time Calculation (min) 45 min    Equipment Utilized During Treatment Gait belt    Activity Tolerance Patient tolerated treatment well;Patient limited by fatigue    Behavior During Therapy [facility] for tasks assessed/performed           Past Medical History:  Diagnosis Date  . Anxiety   . Clonus   . Depression   . Headache   . Pneumonia    "walking"  . Quadriplegia and quadriparesis ([place])   . Spasticity   . Spinal cord tumor   . Wheelchair bound     Past Surgical History:  Procedure Laterality Date  . LAMINECTOMY N/A 06/01/2019   Procedure: Cervical six-seven Laminoplasty, biopsy and resection of intramedullary spinal cord tumor;  Surgeon: [name], MD;  Location: [facility];  Service: Neurosurgery;  Laterality: N/A;  . NO PAST SURGERIES      There were no vitals filed for this visit.   Subjective Assessment - 11/27/19 1643    Subjective No new complaints    Limitations Standing;Walking;House hold activities    How long can you sit comfortably? no issues    How long can you stand comfortably? <5 min    How long can you walk comfortably? <0.25 mile    Patient Stated Goals Progress walking                 1 x 207' with st. Cane SBA 10 x 10' without AD: in parallel bars fwd and bwd CGA Walking laterally without AD: in parallel bars: 5x each way CGA Bil leg press: 70lbs 2 x 10 Uni leg press: 40lbs 2 x 10 R and L Prone knee  flexion: able to bring heel to buttocks- no ROM restrictions Prone femoral nerve flossing with knee bent and ankle DF and PF: 30x each leg Supine Sciatic nerve flossing without any hip flexion (legs on table): ankle DF and PF: 30x R and L L leg shaking passively to improve nerve mobility and relaxation.                      PT Short Term Goals - 11/15/19 1759      PT SHORT TERM GOAL #1   Title Patient will be able to ambulate 115' with cane to improve short distance ambulation within home    Baseline 115' with min/mod A with quad cane due to multi LOB and scissoring 25% (10/13/19); 672' with cane and bilat GRAFOs (11/15/19)    Time 4    Period Weeks    Status Achieved    Target Date 10/18/19      PT SHORT TERM GOAL #2   Title Pt will demo <12 seconds on timed up and go with RW to improve functional mobility (target date 01/10/20)    Baseline 14 seconds RW (eval); 13 seconds rollator (10/13/19); 13.5 seconds SPC + bilat GRAFOs (  11/15/19)    Time 8    Period Weeks    Status Revised    Target Date 01/10/20      PT SHORT TERM GOAL #3   Title Patient will be able to go up and down a ramp and curb step to be able to negotiate community    Baseline CGA with rollator for 3' long ramp, forward and over aerobic stepper acting as curb x10 (10/13/19)    Time 4    Period Weeks    Status Achieved    Target Date 10/18/19             PT Long Term Goals - 11/15/19 1800      PT LONG TERM GOAL #1   Title Patient will demo >50/56 on berg balance scale to improve functional balance and reduce fall risk (Target date 01/10/20)    Baseline 40/56 (eval); 47/56 (11/15/19)    Time 8    Period Weeks    Status Revised    Target Date 01/10/20      PT LONG TERM GOAL #2   Title Patient will be able to ambulate >500 feet with cane to improve short distance community ambulation.    Baseline only walks with RW; 672' on 6 MWT with cane + GRAFO (11/15/19)    Time 8    Period Weeks    Status  Achieved      PT LONG TERM GOAL #3   Title Patient will demo 4+/5 strength in bil LE grossly to improve functional strength with transfers (Target date 01/10/20)    Baseline 3+-4/5 grossly in bil LE    Time 8    Period Weeks    Status Revised    Target Date 01/10/20      PT LONG TERM GOAL #4   Title Patient will be able to go up and down 12 steps with use of 1-2 rails to be able to go up and down steps at home (Target date 01/10/20)    Baseline 2 steps (with one rail) (Eval); 4 steps, but very unsafe with descent (11/15/19)    Time 8    Period Weeks    Status Revised    Target Date 01/10/20      PT LONG TERM GOAL #5   Title Pt with goal to improve 6 MWT using rollator and GRAFOs to >/= 1000' or using SPC and GRAFOs to >/=800' (Target date 01/10/20)    Baseline 672' with [facility] & GRAFOs (11/15/19)    Time 8    Period Weeks    Status Revised    Target Date 01/10/20                 Plan - 11/27/19 1644    Clinical Impression Statement Today's skilled therapy was focused on continue to improvement on gait and mobility with LRAD. We progressed short distance gait without AD. We also worked on improving strength of legs with exercises and working on balance and proprioception. Pt's response with neural flossing was inconclusive. Patient reported fatigue and increased neuropathy from exercises at end of the session.    Personal Factors and Comorbidities Age;Past/Current Experience;Comorbidity 2;Time since onset of injury/illness/exacerbation;Transportation    Examination-Activity Limitations Bathing;Lift;Squat;Stairs;Stand;Transfers    Examination-Participation Restrictions Cleaning;Community Activity;Driving;Laundry;Occupation;Shop;Yard Work    Rehab Potential Fair    PT Frequency 2x / week    PT Duration 8 weeks    PT Treatment/Interventions ADLs/Self Care Home Management;Gait training;Stair training;Functional mobility training;Therapeutic activities;Therapeutic exercise;Balance  training;Neuromuscular  re-education;Manual techniques;Wheelchair mobility training;Patient/family education;Passive range of motion;Energy conservation;Vestibular;Visual/perceptual remediation/compensation;Joint Manipulations    PT Next Visit Plan Work on R hip abduction strengthening, knee flex and extension strengthening. Continue to work on endurance. Work on Engineer, drilling and backwards movement with amb.    PT Home Exercise Plan Access Code: [email or web address]    Consulted and Agree with Plan of Care Patient           Patient will benefit from skilled therapeutic intervention in order to improve the following deficits and impairments:  Abnormal gait, Decreased activity tolerance, Decreased balance, Decreased cognition, Decreased mobility, Decreased endurance, Decreased coordination, Decreased range of motion, Decreased strength, Dizziness, Difficulty walking, Impaired sensation, Impaired tone, Impaired UE functional use, Postural dysfunction, Pain  Visit Diagnosis: Muscle weakness (generalized)  Unsteadiness on feet  Other abnormalities of gait and mobility  Difficulty in walking, not elsewhere classified  Spinal cord tumor  Pain  Abnormal posture     Problem List Patient Active Problem List   Diagnosis Date Noted  . Incomplete paraplegia ([place]) 11/08/2019  . Foot drop, bilateral 11/08/2019  . Spasticity 08/18/2019  . Chronic pain syndrome 08/18/2019  . Nerve pain 08/18/2019  . Clonus 07/21/2019  . Spinal cord tumor   . Pain   . Transaminitis   . Leukocytosis   . Neurogenic bowel   . Postoperative pain   . 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 Acute incomplete quadriplegia ([facility])   . 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 Numbness 05/10/2019  . Weakness of left lower extremity 05/10/2019  . 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 Visual disturbance 05/10/2019    Kerrie Pleasure 11/27/2019, 5:10 PM  [facility] [address] [place], [place], [address] Phone: [phone]   Fax:  [phone]  Name: Jacob Lambert MRN: [id number] Date of Birth: 06/24/1985

## 2019-11-28 DIAGNOSIS — G8250 Quadriplegia, unspecified: Secondary | ICD-10-CM | POA: Diagnosis not present

## 2019-11-29 ENCOUNTER — Ambulatory Visit: Payer: BC Managed Care – PPO | Admitting: Physical Therapy

## 2019-11-29 ENCOUNTER — Other Ambulatory Visit: Payer: Self-pay

## 2019-11-29 ENCOUNTER — Ambulatory Visit: Payer: BC Managed Care – PPO | Admitting: Occupational Therapy

## 2019-11-29 ENCOUNTER — Encounter: Payer: Self-pay | Admitting: Physical Therapy

## 2019-11-29 DIAGNOSIS — R208 Other disturbances of skin sensation: Secondary | ICD-10-CM | POA: Diagnosis not present

## 2019-11-29 DIAGNOSIS — D497 Neoplasm of unspecified behavior of endocrine glands and other parts of nervous system: Secondary | ICD-10-CM | POA: Diagnosis not present

## 2019-11-29 DIAGNOSIS — M6281 Muscle weakness (generalized): Secondary | ICD-10-CM

## 2019-11-29 DIAGNOSIS — R2681 Unsteadiness on feet: Secondary | ICD-10-CM | POA: Diagnosis not present

## 2019-11-29 DIAGNOSIS — R41842 Visuospatial deficit: Secondary | ICD-10-CM | POA: Diagnosis not present

## 2019-11-29 DIAGNOSIS — R262 Difficulty in walking, not elsewhere classified: Secondary | ICD-10-CM

## 2019-11-29 DIAGNOSIS — M21372 Foot drop, left foot: Secondary | ICD-10-CM | POA: Diagnosis not present

## 2019-11-29 DIAGNOSIS — R2689 Other abnormalities of gait and mobility: Secondary | ICD-10-CM | POA: Diagnosis not present

## 2019-11-29 DIAGNOSIS — R278 Other lack of coordination: Secondary | ICD-10-CM

## 2019-11-29 DIAGNOSIS — R52 Pain, unspecified: Secondary | ICD-10-CM | POA: Diagnosis not present

## 2019-11-29 DIAGNOSIS — R293 Abnormal posture: Secondary | ICD-10-CM | POA: Diagnosis not present

## 2019-11-29 DIAGNOSIS — M21371 Foot drop, right foot: Secondary | ICD-10-CM | POA: Diagnosis not present

## 2019-11-29 NOTE — Therapy (Signed)
[facility] [address] [place], [place], [address] Phone: [phone]   Fax:  [phone]  Occupational Therapy Treatment  Patient Details  Name: Jacob Lambert MRN: [id number] Date of Birth: 04-09-1985 Referring Provider (OT): Dr. Courtney Heys   Encounter Date: 11/29/2019   OT End of Session - 11/29/19 1314    Visit Number 15    Number of Visits 25    Date for OT Re-Evaluation 01/01/20    Authorization Time Period 30 visit limit - week 8 of 12 (11/22)    OT Start Time 1314    OT Stop Time 1359    OT Time Calculation (min) 45 min    Activity Tolerance Patient tolerated treatment well    Behavior During Therapy [facility] for tasks assessed/performed           Past Medical History:  Diagnosis Date  . Anxiety   . Clonus   . Depression   . Headache   . Pneumonia    "walking"  . Quadriplegia and quadriparesis ([place])   . Spasticity   . Spinal cord tumor   . Wheelchair bound     Past Surgical History:  Procedure Laterality Date  . LAMINECTOMY N/A 06/01/2019   Procedure: Cervical six-seven Laminoplasty, biopsy and resection of intramedullary spinal cord tumor;  Surgeon: [name], MD;  Location: [facility];  Service: Neurosurgery;  Laterality: N/A;  . NO PAST SURGERIES      There were no vitals filed for this visit.   Treatment:  Standing tolerance/balance with weight shifting at counter while reaching across midline for resistance clothespins x 3 set of standing for approx 5 minutes each. Pt managed and manipulated 1-8# resistance clothespins with BUE appropriately with no drops.  [employer] balls in each hand for in hand manipulation and lumbrical strengthening   BUE strengthening with 3 lb dumbbell  Shoulder flexion x 10 Chest press x 10 Bicep curl x 10 Hammer curl x 10                    OT Short Term Goals - 11/10/19 1413      OT SHORT TERM GOAL #1   Title Pt will be  independent with HEP for coordination and strengthening 11/06/19    Time 4    Period Weeks    Status On-going   yellow theraputty 10/8   Target Date 11/06/19      OT SHORT TERM GOAL #2   Title Pt will increase grip strength in BUE by 5 lbs in order to increase ability to manage clothing (tights, socks, pants) and increased independence with ADLs.    Baseline RUE 33.2 lbs LUE 35.4 lbs    Time 4    Period Weeks    Status Achieved   44.3 LUE; 52 RUE     OT SHORT TERM GOAL #3   Title Pt will improve functional use of LUE as evidenced by performing 50 blocks or more on Box & Blocks test.    Baseline LUE 43 blocks    Time 4    Period Weeks    Status Achieved   70     OT SHORT TERM GOAL #4   Title Pt will perform complex meal prep and/or housekeeping tasks with mod I and showing good safety awareness in order to increase independence with IADLs.    Time 4    Period Weeks    Status New  OT SHORT TERM GOAL #5   Title Pt will verbalize compensatory strategies for visual deficits.    Time 4    Period Weeks    Status Achieved      OT SHORT TERM GOAL #6   Title Pt will improve 9 hole peg test score to 33 seconds or less with BUE in order to increase fine motor coordination in order to increase independence with fasteners and jewelry, etc.    Time 4    Period Weeks    Status Achieved   LUE 27.09 seconds RUE 30.06 seconds     OT SHORT TERM GOAL #7   Title Pt will obtain a lightweight object from higher level cabinet with LUE, dominant hand, for increasing independence with ADLs and IADLs.    Time 4    Period Weeks    Status Achieved      OT SHORT TERM GOAL #8   Title Pt will increase keyboarding/typing skills to completing 30 wpm in order to prep for back to work.    Time 4    Period Weeks    Status Achieved   43 wpm            OT Long Term Goals - 11/29/19 1318      OT LONG TERM GOAL #1   Title Pt will be independent with updated HEP for coordination and strengthening  01/02/20    Time 12    Period Weeks    Status New      OT LONG TERM GOAL #2   Title Pt will increase grip strength in BUE by 10 lbs in order to increase ability to manage clothing (tights, socks, pants) and increased independence with ADLs.    Baseline LUE 35.4 RUE 33.2    Time 12    Period Weeks    Status Achieved   RUE 55.9 LUE 45.8     OT LONG TERM GOAL #3   Title Pt will improve functional use of LUE as evidenced by performing 60 blocks or more on Box & Blocks test.    Baseline LUE 43    Time 12    Period Weeks    Status Achieved   70     OT LONG TERM GOAL #4   Title Pt will improve 9 hole peg test score to 25 seconds or less with BUE in order to increase fine motor coordination in order to increase independence with fasteners and jewelry, etc.    Baseline RUE 37.84 LUE 38.57    Time 12    Period Weeks    Status Achieved   LUE 20.44 RUE 19.34     OT LONG TERM GOAL #5   Title Pt will obtain an object from cabinet greater than or equal to 5lbs with LUE, dominant hand, for increasing independence with ADLs and IADLs.    Time 12    Period Weeks    Status New      OT LONG TERM GOAL #6   Title Pt will increase keyboarding/typing skills to completing 50 wpm in order to prep for back to work.    Time 12    Period Weeks    Status Achieved                 Plan - 11/29/19 1348    Clinical Impression Statement Pt has met 6/8 STGs and 3/6 LTGs and is progressing towards other goals. Skilled OT is beneficial for continuing progress.    OT Occupational  Profile and History Detailed Assessment- Review of Records and additional review of physical, cognitive, psychosocial history related to current functional performance    Occupational performance deficits (Please refer to evaluation for details): IADL's;ADL's;Work;Social Participation;Leisure    Body Structure / Function / Physical Skills Strength;Gait;Decreased knowledge of use of DME;ADL;Balance;Dexterity;GMC;Pain;Tone;Body  mechanics;UE functional use;Endurance;IADL;ROM;Vision;Coordination;Flexibility;Mobility;Sensation;FMC    Rehab Potential Good    Clinical Decision Making Limited treatment options, no task modification necessary    Comorbidities Affecting Occupational Performance: May have comorbidities impacting occupational performance    Modification or Assistance to Complete Evaluation  No modification of tasks or assist necessary to complete eval    OT Frequency 2x / week    OT Duration 12 weeks    OT Treatment/Interventions Self-care/ADL training;Moist Heat;Fluidtherapy;DME and/or AE instruction;Balance training;Therapeutic activities;Gait Training;Ultrasound;Therapeutic exercise;[place]ar mobilization;Visual/perceptual remediation/compensation;Passive range of motion;Functional Mobility Training;Neuromuscular education;Electrical Stimulation;Paraffin;Energy conservation;Manual Therapy;Patient/family education    Plan home management, arm bike, strengthening    Consulted and Agree with Plan of Care Patient           Patient will benefit from skilled therapeutic intervention in order to improve the following deficits and impairments:   Body Structure / Function / Physical Skills: Strength, Gait, Decreased knowledge of use of DME, ADL, Balance, Dexterity, GMC, Pain, Tone, Body mechanics, UE functional use, Endurance, IADL, ROM, Vision, Coordination, Flexibility, Mobility, Sensation, [facility]       Visit Diagnosis: Muscle weakness (generalized)  Other lack of coordination  Visuospatial deficit  Other disturbances of skin sensation    Problem List Patient Active Problem List   Diagnosis Date Noted  . Incomplete paraplegia ([place]) 11/08/2019  . Foot drop, bilateral 11/08/2019  . Spasticity 08/18/2019  . Chronic pain syndrome 08/18/2019  . Nerve pain 08/18/2019  . Clonus 07/21/2019  . Spinal cord tumor   . Pain   . Transaminitis   . Leukocytosis   . Neurogenic bowel   . Postoperative pain   .  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 Acute incomplete quadriplegia ([facility])   . 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 Numbness 05/10/2019  . Weakness of left lower extremity 05/10/2019  . 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 Visual disturbance 05/10/2019    Zachery Conch MOT, OTR/L  11/29/2019, 1:51 PM  [facility] [address] [place], [place], [address] Phone: [phone]   Fax:  [phone]  Name: Jacob Lambert MRN: [id number] Date of Birth: 1986-01-03

## 2019-12-03 NOTE — Therapy (Signed)
[facility] [address] [place], [place], [address] Phone: [phone]   Fax:  [phone]  Physical Therapy Treatment  Patient Details  Name: Jacob Lambert MRN: [id number] Date of Birth: April 06, 1985 Referring Provider (PT): Dr. Courtney Heys   Encounter Date: 11/29/2019     11/29/19 1404  PT Visits / Re-Eval  Visit Number 19  Number of Visits 33  Date for PT Re-Evaluation 01/10/20  Authorization  Authorization Type [employer]  PT Time Calculation  PT Start Time 1400  PT Stop Time 1443  PT Time Calculation (min) 43 min  PT - End of Session  Equipment Utilized During Treatment Gait belt  Activity Tolerance Patient tolerated treatment well;Patient limited by fatigue  Behavior During Therapy [facility] for tasks assessed/performed    Past Medical History:  Diagnosis Date  . Anxiety   . Clonus   . Depression   . Headache   . Pneumonia    "walking"  . Quadriplegia and quadriparesis ([place])   . Spasticity   . Spinal cord tumor   . Wheelchair bound     Past Surgical History:  Procedure Laterality Date  . LAMINECTOMY N/A 06/01/2019   Procedure: Cervical six-seven Laminoplasty, biopsy and resection of intramedullary spinal cord tumor;  Surgeon: [name], MD;  Location: [facility];  Service: Neurosurgery;  Laterality: N/A;  . NO PAST SURGERIES      There were no vitals filed for this visit.     11/29/19 1402  Symptoms/Limitations  Subjective No new complaints. Recieved his braces this morning at 9am. Has had them on since then. Feels he has to work harder to stand up with them on due to "gravitational forces". Does also feel the weight of them cause him to take smaller steps and fatigue more, however able to walk further before his knees buckle.  Limitations Standing;Walking;House hold activities  How long can you sit comfortably? no issues  How long can you stand comfortably? <5 min  How long can you walk  comfortably? <0.25 mile  Patient Stated Goals Progress walking  Pain Assessment  Currently in Pain? No/denies  Pain Score 0      11/29/19 1404  Transfers  Transfers Sit to Stand;Stand to Sit  Sit to Stand 5: Supervision;From chair/3-in-1;Without upper extremity assist  Stand to Sit 5: Supervision;With upper extremity assist;With armrests  Ambulation/Gait  Ambulation/Gait Yes  Ambulation/Gait Assistance 5: Supervision;4: Min assist  Ambulation/Gait Assistance Details with pt's personal AFO's on bil LE's with min guard assist indoors with cane (supervison to Mod I with rollator). Min assist for balance outdoor surfaces due to increased postural sway, more narrowed base of support and cues for cane placement with gait.   Ambulation Distance (Feet) 300 Feet (x1 in/outdoors, plus around gym with session)  Assistive device Rollator  Gait Pattern Step-through pattern;Narrow base of support;Lateral trunk lean to right  Ambulation Surface Level;Indoor;Unlevel;Outdoor;Paved  Stairs Yes  Stairs Assistance 4: Min guard  Stairs Assistance Details (indicate cue type and reason) cues for small "kick back" when lifting second foot up with ascent to keep toes from catching. with descent cues to keep toes of stance leg over edge while lowering other leg down.   Stair Management Technique Two rails;Step to pattern;Forwards  Number of Stairs 4 (x 2 reps)  Height of Stairs 6  High Level Balance  High Level Balance Activities Negotiating over obstacles;Marching forwards;Marching backwards;Side stepping  High Level Balance Comments with bil braces/Cane: forward stepping over 3 bolsters with  cues on sequencing with up to min guard/min assist for balance; in parallel bars with light UE support on bars for 3 laps each/each way  Self-Care  Self-Care Other Self-Care Comments  Other Self-Care Comments  skin intact with new braces. pt able to self doff/don braces. discussed gradual increase in wear time with  monitoring of skin.        PT Short Term Goals - 11/15/19 1759      PT SHORT TERM GOAL #1   Title Patient will be able to ambulate 79' with cane to improve short distance ambulation within home    Baseline 115' with min/mod A with quad cane due to multi LOB and scissoring 25% (10/13/19); 672' with cane and bilat GRAFOs (11/15/19)    Time 4    Period Weeks    Status Achieved    Target Date 10/18/19      PT SHORT TERM GOAL #2   Title Pt will demo <12 seconds on timed up and go with RW to improve functional mobility (target date 01/10/20)    Baseline 14 seconds RW (eval); 13 seconds rollator (10/13/19); 13.5 seconds SPC + bilat GRAFOs (11/15/19)    Time 8    Period Weeks    Status Revised    Target Date 01/10/20      PT SHORT TERM GOAL #3   Title Patient will be able to go up and down a ramp and curb step to be able to negotiate community    Baseline CGA with rollator for 3' long ramp, forward and over aerobic stepper acting as curb x10 (10/13/19)    Time 4    Period Weeks    Status Achieved    Target Date 10/18/19             PT Long Term Goals - 11/15/19 1800      PT LONG TERM GOAL #1   Title Patient will demo >50/56 on berg balance scale to improve functional balance and reduce fall risk (Target date 01/10/20)    Baseline 40/56 (eval); 47/56 (11/15/19)    Time 8    Period Weeks    Status Revised    Target Date 01/10/20      PT LONG TERM GOAL #2   Title Patient will be able to ambulate >500 feet with cane to improve short distance community ambulation.    Baseline only walks with RW; 672' on 6 MWT with cane + GRAFO (11/15/19)    Time 8    Period Weeks    Status Achieved      PT LONG TERM GOAL #3   Title Patient will demo 4+/5 strength in bil LE grossly to improve functional strength with transfers (Target date 01/10/20)    Baseline 3+-4/5 grossly in bil LE    Time 8    Period Weeks    Status Revised    Target Date 01/10/20      PT LONG TERM GOAL #4   Title Patient  will be able to go up and down 12 steps with use of 1-2 rails to be able to go up and down steps at home (Target date 01/10/20)    Baseline 2 steps (with one rail) (Eval); 4 steps, but very unsafe with descent (11/15/19)    Time 8    Period Weeks    Status Revised    Target Date 01/10/20      PT LONG TERM GOAL #5   Title Pt with goal to improve 6  MWT using rollator and GRAFOs to >/= 1000' or using SPC and GRAFOs to >/=800' (Target date 01/10/20)    Baseline 672' with [facility] & GRAFOs (11/15/19)    Time 8    Period Weeks    Status Revised    Target Date 01/10/20             11/29/19 1404  Plan  Clinical Impression Statement Today's skilled session focused on educatetion on use of new AFO's and gait/balance with new AFO'S. No issues noted or reported in session. The pt is progressing toward goals and should benefit from continued PT to progress toward unmet goals.  Personal Factors and Comorbidities Age;Past/Current Experience;Comorbidity 2;Time since onset of injury/illness/exacerbation;Transportation  Examination-Activity Limitations Bathing;Lift;Squat;Stairs;Stand;Transfers  Examination-Participation Restrictions Cleaning;Community Activity;Driving;Laundry;Occupation;Shop;Yard Work  Pt will benefit from skilled therapeutic intervention in order to improve on the following deficits Abnormal gait;Decreased activity tolerance;Decreased balance;Decreased cognition;Decreased mobility;Decreased endurance;Decreased coordination;Decreased range of motion;Decreased strength;Dizziness;Difficulty walking;Impaired sensation;Impaired tone;Impaired UE functional use;Postural dysfunction;Pain  Rehab Potential Fair  PT Frequency 2x / week  PT Duration 8 weeks  PT Treatment/Interventions ADLs/Self Care Home Management;Gait training;Stair training;Functional mobility training;Therapeutic activities;Therapeutic exercise;Balance training;Neuromuscular re-education;Manual techniques;Wheelchair mobility  training;Patient/family education;Passive range of motion;Energy conservation;Vestibular;Visual/perceptual remediation/compensation;Joint Manipulations  PT Next Visit Plan Work on R hip abduction strengthening, knee flex and extension strengthening. Continue to work on endurance. Work on Engineer, drilling and backwards movement with amb.  PT Home Exercise Plan Access Code: [email or web address]; [email or web address]  Consulted and Agree with Plan of Care Patient          Patient will benefit from skilled therapeutic intervention in order to improve the following deficits and impairments:  Abnormal gait, Decreased activity tolerance, Decreased balance, Decreased cognition, Decreased mobility, Decreased endurance, Decreased coordination, Decreased range of motion, Decreased strength, Dizziness, Difficulty walking, Impaired sensation, Impaired tone, Impaired UE functional use, Postural dysfunction, Pain  Visit Diagnosis: Muscle weakness (generalized)  Other lack of coordination  Other disturbances of skin sensation  Unsteadiness on feet  Other abnormalities of gait and mobility  Difficulty in walking, not elsewhere classified     Problem List Patient Active Problem List   Diagnosis Date Noted  . Incomplete paraplegia ([place]) 11/08/2019  . Foot drop, bilateral 11/08/2019  . Spasticity 08/18/2019  . Chronic pain syndrome 08/18/2019  . Nerve pain 08/18/2019  . Clonus 07/21/2019  . Spinal cord tumor   . Pain   . Transaminitis   . Leukocytosis   . Neurogenic bowel   . Postoperative pain   . 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 Acute incomplete quadriplegia ([facility])   . 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 Numbness 05/10/2019  . Weakness of left lower extremity 05/10/2019  . 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 Visual disturbance 05/10/2019    Willow Ora, PTA, [facility] [address], [place], [place] [address] [phone] 12/03/19, 9:26 PM   Name: Jacob Lambert MRN: [id number] Date of Birth: 1985-07-26

## 2019-12-04 ENCOUNTER — Ambulatory Visit: Payer: BC Managed Care – PPO | Admitting: Occupational Therapy

## 2019-12-04 ENCOUNTER — Other Ambulatory Visit: Payer: Self-pay

## 2019-12-04 ENCOUNTER — Ambulatory Visit: Payer: BC Managed Care – PPO | Admitting: Physical Therapy

## 2019-12-04 ENCOUNTER — Encounter: Payer: Self-pay | Admitting: Occupational Therapy

## 2019-12-04 DIAGNOSIS — R2681 Unsteadiness on feet: Secondary | ICD-10-CM

## 2019-12-04 DIAGNOSIS — R208 Other disturbances of skin sensation: Secondary | ICD-10-CM | POA: Diagnosis not present

## 2019-12-04 DIAGNOSIS — R2689 Other abnormalities of gait and mobility: Secondary | ICD-10-CM | POA: Diagnosis not present

## 2019-12-04 DIAGNOSIS — R52 Pain, unspecified: Secondary | ICD-10-CM | POA: Diagnosis not present

## 2019-12-04 DIAGNOSIS — M6281 Muscle weakness (generalized): Secondary | ICD-10-CM

## 2019-12-04 DIAGNOSIS — R41842 Visuospatial deficit: Secondary | ICD-10-CM | POA: Diagnosis not present

## 2019-12-04 DIAGNOSIS — R278 Other lack of coordination: Secondary | ICD-10-CM | POA: Diagnosis not present

## 2019-12-04 DIAGNOSIS — R293 Abnormal posture: Secondary | ICD-10-CM | POA: Diagnosis not present

## 2019-12-04 DIAGNOSIS — D497 Neoplasm of unspecified behavior of endocrine glands and other parts of nervous system: Secondary | ICD-10-CM | POA: Diagnosis not present

## 2019-12-04 DIAGNOSIS — R262 Difficulty in walking, not elsewhere classified: Secondary | ICD-10-CM | POA: Diagnosis not present

## 2019-12-04 NOTE — Therapy (Signed)
[facility] [address] [place], [place], [address] Phone: [phone]   Fax:  [phone]  Occupational Therapy Treatment  Patient Details  Name: Jacob Lambert MRN: [id number] Date of Birth: [birth year] Referring Provider (OT): Dr. Courtney Heys   Encounter Date: 12/04/2019   OT End of Session - 12/04/19 1536    Visit Number 16    Number of Visits 25    Date for OT Re-Evaluation 01/01/20    Authorization Time Period 30 visit limit - week 9 of 12 (11/29)    OT Start Time 1534    OT Stop Time 1615    OT Time Calculation (min) 41 min    Activity Tolerance Patient tolerated treatment well    Behavior During Therapy [facility] for tasks assessed/performed           Past Medical History:  Diagnosis Date  . Anxiety   . Clonus   . Depression   . Headache   . Pneumonia    "walking"  . Quadriplegia and quadriparesis ([place])   . Spasticity   . Spinal cord tumor   . Wheelchair bound     Past Surgical History:  Procedure Laterality Date  . LAMINECTOMY N/A 06/01/2019   Procedure: Cervical six-seven Laminoplasty, biopsy and resection of intramedullary spinal cord tumor;  Surgeon: [name], MD;  Location: [facility];  Service: Neurosurgery;  Laterality: N/A;  . NO PAST SURGERIES      There were no vitals filed for this visit.   Subjective Assessment - 12/04/19 1537    Subjective  "I haven't left the house since I left therapy last week and it was great"    Pertinent History PMH depression    Limitations Fall Risk.    Patient Stated Goals "I want to have as much functional independence as possible" more dexterity and typing skills for work, wear tights and wear heels    Currently in Pain? Yes    Pain Score 4     Pain Location Back    Pain Orientation Upper    Pain Descriptors / Indicators Discomfort    Pain Type Neuropathic pain    Pain Frequency Intermittent            Treatment  Weighted (2.2 lb)  medicine ball exercises x 12 - bicep curls, chest press, shoulder press, circumduction, shoulder flexion  LUE peg board design with small pegs and focus on in hand manipulation with placing 8 pegs in a time in palm and translating one peg at a time for placing in pegboard. Pt with moderate drops at first but increased skill as activity went on. Removed with in hand manipulation with RUE with minimal to no drops.                    OT Short Term Goals - 12/04/19 1542      OT SHORT TERM GOAL #1   Title Pt will be independent with HEP for coordination and strengthening 11/06/19    Time 4    Period Weeks    Status Achieved   yellow theraputty 10/8   Target Date 11/06/19      OT SHORT TERM GOAL #2   Title Pt will increase grip strength in BUE by 5 lbs in order to increase ability to manage clothing (tights, socks, pants) and increased independence with ADLs.    Baseline RUE 33.2 lbs LUE 35.4 lbs    Time 4    Period  Weeks    Status Achieved   44.3 LUE; 52 RUE     OT SHORT TERM GOAL #3   Title Pt will improve functional use of LUE as evidenced by performing 50 blocks or more on Box & Blocks test.    Baseline LUE 43 blocks    Time 4    Period Weeks    Status Achieved   70     OT SHORT TERM GOAL #4   Title Pt will perform complex meal prep and/or housekeeping tasks with mod I and showing good safety awareness in order to increase independence with IADLs.    Time 4    Period Weeks    Status On-going      OT SHORT TERM GOAL #5   Title Pt will verbalize compensatory strategies for visual deficits.    Time 4    Period Weeks    Status Achieved      OT SHORT TERM GOAL #6   Title Pt will improve 9 hole peg test score to 33 seconds or less with BUE in order to increase fine motor coordination in order to increase independence with fasteners and jewelry, etc.    Time 4    Period Weeks    Status Achieved   LUE 27.09 seconds RUE 30.06 seconds     OT SHORT TERM GOAL #7    Title Pt will obtain a lightweight object from higher level cabinet with LUE, dominant hand, for increasing independence with ADLs and IADLs.    Time 4    Period Weeks    Status Achieved      OT SHORT TERM GOAL #8   Title Pt will increase keyboarding/typing skills to completing 30 wpm in order to prep for back to work.    Time 4    Period Weeks    Status Achieved   43 wpm            OT Long Term Goals - 12/04/19 1543      OT LONG TERM GOAL #1   Title Pt will be independent with updated HEP for coordination and strengthening 01/02/20    Time 12    Period Weeks    Status On-going      OT LONG TERM GOAL #2   Title Pt will increase grip strength in BUE by 10 lbs in order to increase ability to manage clothing (tights, socks, pants) and increased independence with ADLs.    Baseline LUE 35.4 RUE 33.2    Time 12    Period Weeks    Status Achieved   RUE 55.9 LUE 45.8     OT LONG TERM GOAL #3   Title Pt will improve functional use of LUE as evidenced by performing 60 blocks or more on Box & Blocks test.    Baseline LUE 43    Time 12    Period Weeks    Status Achieved   70     OT LONG TERM GOAL #4   Title Pt will improve 9 hole peg test score to 25 seconds or less with BUE in order to increase fine motor coordination in order to increase independence with fasteners and jewelry, etc.    Baseline RUE 37.84 LUE 38.57    Time 12    Period Weeks    Status Achieved   LUE 20.44 RUE 19.34     OT LONG TERM GOAL #5   Title Pt will obtain an object from cabinet greater than or equal  to 5lbs with LUE, dominant hand, for increasing independence with ADLs and IADLs.    Time 12    Period Weeks    Status New      OT LONG TERM GOAL #6   Title Pt will increase keyboarding/typing skills to completing 50 wpm in order to prep for back to work.    Time 12    Period Weeks    Status Achieved                 Plan - 12/04/19 1537    OT Occupational Profile and History Detailed  Assessment- Review of Records and additional review of physical, cognitive, psychosocial history related to current functional performance    Occupational performance deficits (Please refer to evaluation for details): IADL's;ADL's;Work;Social Participation;Leisure    Body Structure / Function / Physical Skills Strength;Gait;Decreased knowledge of use of DME;ADL;Balance;Dexterity;GMC;Pain;Tone;Body mechanics;UE functional use;Endurance;IADL;ROM;Vision;Coordination;Flexibility;Mobility;Sensation;FMC    Rehab Potential Good    Clinical Decision Making Limited treatment options, no task modification necessary    Comorbidities Affecting Occupational Performance: May have comorbidities impacting occupational performance    Modification or Assistance to Complete Evaluation  No modification of tasks or assist necessary to complete eval    OT Frequency 2x / week    OT Duration 12 weeks    OT Treatment/Interventions Self-care/ADL training;Moist Heat;Fluidtherapy;DME and/or AE instruction;Balance training;Therapeutic activities;Gait Training;Ultrasound;Therapeutic exercise;[place]ar mobilization;Visual/perceptual remediation/compensation;Passive range of motion;Functional Mobility Training;Neuromuscular education;Electrical Stimulation;Paraffin;Energy conservation;Manual Therapy;Patient/family education    Plan home management, arm bike, strengthening    Consulted and Agree with Plan of Care Patient           Patient will benefit from skilled therapeutic intervention in order to improve the following deficits and impairments:   Body Structure / Function / Physical Skills: Strength, Gait, Decreased knowledge of use of DME, ADL, Balance, Dexterity, GMC, Pain, Tone, Body mechanics, UE functional use, Endurance, IADL, ROM, Vision, Coordination, Flexibility, Mobility, Sensation, [facility]       Visit Diagnosis: Muscle weakness (generalized)  Other lack of coordination  Visuospatial deficit  Other disturbances of  skin sensation  Unsteadiness on feet    Problem List Patient Active Problem List   Diagnosis Date Noted  . Incomplete paraplegia ([place]) 11/08/2019  . Foot drop, bilateral 11/08/2019  . Spasticity 08/18/2019  . Chronic pain syndrome 08/18/2019  . Nerve pain 08/18/2019  . Clonus 07/21/2019  . Spinal cord tumor   . Pain   . Transaminitis   . Leukocytosis   . Neurogenic bowel   . Postoperative pain   . 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 Acute incomplete quadriplegia ([facility])   . 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 Numbness 05/10/2019  . Weakness of left lower extremity 05/10/2019  . 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 Visual disturbance 05/10/2019    Zachery Conch MOT, OTR/L  12/04/2019, 4:17 PM  [facility] [address] [place], [place], [address] Phone: [phone]   Fax:  [phone]  Name: Jacob Lambert MRN: [id number] Date of Birth: [birth year]

## 2019-12-04 NOTE — Therapy (Signed)
[facility] [address] [place], [place], [address] Phone: [phone]   Fax:  [phone]  Physical Therapy Treatment  Patient Details  Name: Jacob Lambert MRN: [id number] Date of Birth: [birth year] Referring Provider (PT): Dr. Courtney Heys   Encounter Date: 12/04/2019   PT End of Session - 12/04/19 1704    Visit Number 20    Number of Visits 33    Date for PT Re-Evaluation 01/10/20    Authorization Type BCBS    PT Start Time 1620    PT Stop Time 1700    PT Time Calculation (min) 40 min    Equipment Utilized During Treatment Gait belt    Activity Tolerance Patient tolerated treatment well;Patient limited by fatigue    Behavior During Therapy [facility] for tasks assessed/performed           Past Medical History:  Diagnosis Date  . Anxiety   . Clonus   . Depression   . Headache   . Pneumonia    "walking"  . Quadriplegia and quadriparesis ([place])   . Spasticity   . Spinal cord tumor   . Wheelchair bound     Past Surgical History:  Procedure Laterality Date  . LAMINECTOMY N/A 06/01/2019   Procedure: Cervical six-seven Laminoplasty, biopsy and resection of intramedullary spinal cord tumor;  Surgeon: [name], MD;  Location: [facility];  Service: Neurosurgery;  Laterality: N/A;  . NO PAST SURGERIES      There were no vitals filed for this visit.   Subjective Assessment - 12/04/19 1620    Subjective Pt states he has been compliant with the recommended instructions on breaking in the braces. Pt reports difficulty fitting the brace with his black shoes. Pt reports increased spasticity with increasing cold temperatures.    Limitations Standing;Walking;House hold activities    How long can you sit comfortably? no issues    How long can you stand comfortably? <5 min    How long can you walk comfortably? <0.25 mile    Patient Stated Goals Progress walking              In // bars:  Retro gait x4 reps  across bars on open blue mat  Forward gait x4 reps across on open blue mat  Tandem stance on open blue mat 2x30 sec; LOB/knee buckling on R  Side stepping on open blue mat x4 reps across bars  Step up and over 4" step x5   Double leg press 80# x10; single leg press R: 40#; single leg press L: 70#          [facility] Adult PT Treatment/Exercise - 12/04/19 0001      Ambulation/Gait   Ambulation/Gait Assistance 4: Min assist;4: Min guard    Ambulation/Gait Assistance Details with personal AFOs    Ambulation Distance (Feet) 1025 Feet    Assistive device Small based quad cane    Gait Pattern Step-through pattern;Narrow base of support;Lateral trunk lean to right    Ambulation Surface Level;Indoor    Gait Comments 11 min to amb around building                    PT Short Term Goals - 11/15/19 1759      PT SHORT TERM GOAL #1   Title Patient will be able to ambulate 115' with cane to improve short distance ambulation within home    Baseline 115' with min/mod A with quad cane due to multi  LOB and scissoring 25% (10/13/19); 672' with cane and bilat GRAFOs (11/15/19)    Time 4    Period Weeks    Status Achieved    Target Date 10/18/19      PT SHORT TERM GOAL #2   Title Pt will demo <12 seconds on timed up and go with RW to improve functional mobility (target date 01/10/20)    Baseline 14 seconds RW (eval); 13 seconds rollator (10/13/19); 13.5 seconds SPC + bilat GRAFOs (11/15/19)    Time 8    Period Weeks    Status Revised    Target Date 01/10/20      PT SHORT TERM GOAL #3   Title Patient will be able to go up and down a ramp and curb step to be able to negotiate community    Baseline CGA with rollator for 3' long ramp, forward and over aerobic stepper acting as curb x10 (10/13/19)    Time 4    Period Weeks    Status Achieved    Target Date 10/18/19             PT Long Term Goals - 11/15/19 1800      PT LONG TERM GOAL #1   Title Patient will demo >50/56 on berg  balance scale to improve functional balance and reduce fall risk (Target date 01/10/20)    Baseline 40/56 (eval); 47/56 (11/15/19)    Time 8    Period Weeks    Status Revised    Target Date 01/10/20      PT LONG TERM GOAL #2   Title Patient will be able to ambulate >500 feet with cane to improve short distance community ambulation.    Baseline only walks with RW; 672' on 6 MWT with cane + GRAFO (11/15/19)    Time 8    Period Weeks    Status Achieved      PT LONG TERM GOAL #3   Title Patient will demo 4+/5 strength in bil LE grossly to improve functional strength with transfers (Target date 01/10/20)    Baseline 3+-4/5 grossly in bil LE    Time 8    Period Weeks    Status Revised    Target Date 01/10/20      PT LONG TERM GOAL #4   Title Patient will be able to go up and down 12 steps with use of 1-2 rails to be able to go up and down steps at home (Target date 01/10/20)    Baseline 2 steps (with one rail) (Eval); 4 steps, but very unsafe with descent (11/15/19)    Time 8    Period Weeks    Status Revised    Target Date 01/10/20      PT LONG TERM GOAL #5   Title Pt with goal to improve 6 MWT using rollator and GRAFOs to >/= 1000' or using SPC and GRAFOs to >/=800' (Target date 01/10/20)    Baseline 672' with [facility] & GRAFOs (11/15/19)    Time 8    Period Weeks    Status Revised    Target Date 01/10/20                 Plan - 12/04/19 1702    Clinical Impression Statement Treatment focused on continuing to improve pt's endurance with ambulation, strength, and balance on unlevel/compliant surfaces. Pt with 1 instance of knee buckling during tandem stance. Pt continues to progress towards goals.    Personal Factors and Comorbidities Age;Past/Current Experience;Comorbidity  2;Time since onset of injury/illness/exacerbation;Transportation    Examination-Activity Limitations Bathing;Lift;Squat;Stairs;Stand;Transfers    Examination-Participation Restrictions Cleaning;Community  Activity;Driving;Laundry;Occupation;Shop;Yard Work    Rehab Potential Fair    PT Frequency 2x / week    PT Duration 8 weeks    PT Treatment/Interventions ADLs/Self Care Home Management;Gait training;Stair training;Functional mobility training;Therapeutic activities;Therapeutic exercise;Balance training;Neuromuscular re-education;Manual techniques;Wheelchair mobility training;Patient/family education;Passive range of motion;Energy conservation;Vestibular;Visual/perceptual remediation/compensation;Joint Manipulations    PT Next Visit Plan Work on R hip abduction strengthening, knee flex and extension strengthening. Continue to work on endurance. Work on Engineer, drilling and backwards movement with amb.    PT Home Exercise Plan Access Code: [email or web address]; [email or web address]    Consulted and Agree with Plan of Care Patient           Patient will benefit from skilled therapeutic intervention in order to improve the following deficits and impairments:  Abnormal gait, Decreased activity tolerance, Decreased balance, Decreased cognition, Decreased mobility, Decreased endurance, Decreased coordination, Decreased range of motion, Decreased strength, Dizziness, Difficulty walking, Impaired sensation, Impaired tone, Impaired UE functional use, Postural dysfunction, Pain  Visit Diagnosis: Muscle weakness (generalized)  Other lack of coordination  Unsteadiness on feet  Other abnormalities of gait and mobility     Problem List Patient Active Problem List   Diagnosis Date Noted  . Incomplete paraplegia ([place]) 11/08/2019  . Foot drop, bilateral 11/08/2019  . Spasticity 08/18/2019  . Chronic pain syndrome 08/18/2019  . Nerve pain 08/18/2019  . Clonus 07/21/2019  . Spinal cord tumor   . Pain   . Transaminitis   . Leukocytosis   . Neurogenic bowel   . Postoperative pain   . 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 Acute incomplete quadriplegia ([facility])   . 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 Numbness  05/10/2019  . Weakness of left lower extremity 05/10/2019  . 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 Visual disturbance 05/10/2019    Fadi Menter April Ma L Ayaz Sondgeroth  PT, DPT 12/04/2019, 5:05 PM  [facility] [address] [place], [place], [address] Phone: [phone]   Fax:  [phone]  Name: Jacob Lambert MRN: [id number] Date of Birth: 01/27/1985

## 2019-12-06 ENCOUNTER — Other Ambulatory Visit: Payer: Self-pay

## 2019-12-06 ENCOUNTER — Encounter: Payer: Self-pay | Admitting: Physical Medicine and Rehabilitation

## 2019-12-06 ENCOUNTER — Encounter
Payer: BC Managed Care – PPO | Attending: Physical Medicine and Rehabilitation | Admitting: Physical Medicine and Rehabilitation

## 2019-12-06 VITALS — BP 118/81 | HR 81 | Temp 98.7°F | Ht 70.0 in | Wt 144.6 lb

## 2019-12-06 DIAGNOSIS — G8222 Paraplegia, incomplete: Secondary | ICD-10-CM

## 2019-12-06 DIAGNOSIS — M21372 Foot drop, left foot: Secondary | ICD-10-CM

## 2019-12-06 DIAGNOSIS — G894 Chronic pain syndrome: Secondary | ICD-10-CM | POA: Diagnosis not present

## 2019-12-06 DIAGNOSIS — C720 Malignant neoplasm of spinal cord: Secondary | ICD-10-CM | POA: Diagnosis not present

## 2019-12-06 DIAGNOSIS — R252 Cramp and spasm: Secondary | ICD-10-CM

## 2019-12-06 DIAGNOSIS — M21371 Foot drop, right foot: Secondary | ICD-10-CM

## 2019-12-06 MED ORDER — DANTROLENE SODIUM 100 MG PO CAPS: 100.0000 mg | ORAL_CAPSULE | Freq: Three times a day (TID) | ORAL | 5 refills | Status: DC

## 2019-12-06 NOTE — Patient Instructions (Addendum)
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 Suggest laying on stomach 2x/day - for 10-15 minutes before get out of bed and when go to bed, before sleep- can help spasticity esp at night.   2.  Increase Dantrolene 100 mg in AM and 200 mg  Nightly for spasms.  Rx says 3x/day-   3. No more than 1 alcoholic drink with Baclofen dose.   4. Can take Prep- don't miss CMP/ liver function tests every 6 months.   5. Needs power w/c- looking at folding electric, due to [name]- not paraplegia- need to get, requires it for going any distance.   6. Would be great for pt to get into [name] outpatient intensive program. Only chance of getting more strength back at this point.  Thinking moving to [place].    7. F/U in 3 months- double appointment- MIGHT need botox of RLE in future- not right now.

## 2019-12-06 NOTE — Progress Notes (Signed)
Subjective:    Patient ID: Jacob Lambert, adult    DOB: [birth year], 34 y.o.   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   Got B/L AFOs on 11/24- really helpful.   Now able to walk with single point cane for short distances did 1/6 mile with 2 stops- when finished, was spent/couldn't do anything else.    When took AFOs off, knee gave out.  Not making gains in strength and endurance much anymore.  Concerned about osteoporosis esp due to young age.    Went to [place]- didn't have a ramp to reach the plane- damaged the w/c, forgot the RW- the only thing that worked, was the condom catheter.   Spasticity has been worse the last few weeks- esp due to cold that's getting worse.  More shaking, more spasms, more tightness Making it hard to sleep because of tone.   Sometimes lays on stomach-  But not really.   Wants to restart Prep- I don't think it will interfere will    Libido has decreased a LOT!  Needs to have Viagra to get an erection.   Tried manual w/c in [place]_ couldn't go any distance.  Going to be [employer].   Is an outpatient therapy program at [facility]- in [place]- wants to go.    Pain Inventory Average Pain 4 Pain Right Now 5 My pain is constant, sharp, burning, tingling and aching  LOCATION OF PAIN  Upper back,ower back, both wrist, finger on left hand, left leg, right knee, right foot, l. Rectum (bowel movements) BOWEL Number of stools per week: 4-5 Oral laxative use Yes  Type of laxative- Oral  Enema or suppository use Yes  History of colostomy No  Incontinent No   BLADDER Normal In and out cath, frequency Condom cath when needed. Able to self cath No    Bladder incontinence No  Frequent urination No  Leakage with coughing "Just a little bit" Difficulty starting stream No  Incomplete bladder emptying No    Mobility use a walker how many minutes can you walk? 10 mins ability to climb steps?  yes do you drive?  no use a wheelchair Do you have any goals in this area?  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  disabled: date disabled 2021  Neuro/Psych bladder control problems bowel control problems weakness numbness tremor tingling trouble walking spasms dizziness anxiety  Prior Studies Any changes since last visit?  no  Physicians involved in your care Any changes since last visit?  no   No family history on file. Social History   Socioeconomic History  . Marital status: Single    Spouse name: Not on file  . Number of children: Not on file  . Years of education: Not on file  . Highest education level: Not on file  Occupational History  . Not on file  Tobacco Use  . Smoking status: Former Research scientist (life sciences)  . Smokeless tobacco: Never Used  . Tobacco comment: none in years   Vaping Use  . Vaping Use: Never used  Substance and Sexual Activity  . Alcohol use: Not Currently  Comment: 2 bottles of wine per week, Maybe 4 - 8 shots aweek  . Drug use: Not Currently    Comment: CDB  . Sexual activity: Not Currently  Other Topics Concern  . Not on file  Social History Narrative   Lives with parents   Caffeine use: minimum- 3 cups per day (on average 6-10 cups per day)   Left handed    Social Determinants of Health   Financial Resource Strain:   . Difficulty of Paying Living Expenses: Not on file  Food Insecurity:   . Worried About Charity fundraiser in the Last Year: Not on file  . Ran Out of Food in the Last Year: Not on file  Transportation Needs:   . Lack of Transportation (Medical): Not on file  . Lack of Transportation (Non-Medical): Not on file  Physical Activity:   . Days of  Exercise per Week: Not on file  . Minutes of Exercise per Session: Not on file  Stress:   . Feeling of Stress : Not on file  Social Connections:   . Frequency of Communication with Friends and Family: Not on file  . Frequency of Social Gatherings with Friends and Family: Not on file  . Attends Religious Services: Not on file  . Active Member of Clubs or Organizations: Not on file  . Attends Archivist Meetings: Not on file  . Marital Status: Not on file   Past Surgical History:  Procedure Laterality Date  . LAMINECTOMY N/A 06/01/2019   Procedure: Cervical six-seven Laminoplasty, biopsy and resection of intramedullary spinal cord tumor;  Surgeon: [name], MD;  Location: [facility];  Service: Neurosurgery;  Laterality: N/A;  . NO PAST SURGERIES     Past Medical History:  Diagnosis Date  . Anxiety   . Clonus   . Depression   . Headache   . Pneumonia    "walking"  . Quadriplegia and quadriparesis ([employer])   . Spasticity   . Spinal cord tumor   . Wheelchair bound    BP 118/81   Pulse 81   Temp 98.7 F (37.1 C)   Ht 5\' 10"  (1.778 m)   Wt 144 lb 9.6 oz (65.6 kg)   SpO2 98%   BMI 20.75 kg/m   Opioid Risk Score:   Fall Risk Score:  `1  Depression screen PHQ 2/9  Depression screen [facility] 2/9 10/02/2019 08/18/2019  Decreased Interest 1 2  Down, Depressed, Hopeless 1 2  PHQ - 2 Score 2 4   Review of Systems  Gastrointestinal: Positive for constipation and rectal pain.  Musculoskeletal: Positive for back pain and gait problem.       Left Leg pain, pain in left finger, Upper & lower back pain, pain in knees  Neurological: Positive for dizziness, tremors, weakness and numbness.  All other systems reviewed and are negative.      Objective:   Physical Exam  Awake, alert, appropriate, wearing carbon  AFOs and has rollator, NAD MAS of 2 in LLE, but MAS of 3-4 in RLE.  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 Suggest laying on stomach 2x/day - for 10-15 minutes before get out  of bed and when go to bed, before sleep- can help spasticity esp at night.   2.  Increase Dantrolene 100 mg in AM and 200 mg  Nightly for spasms.  Rx says 3x/day-   3. No more than 1 alcoholic drink with Baclofen dose.   4. Can take Prep- don't miss CMP/ liver function tests every 6 months.   5. Needs power w/c- looking at folding electric, due to [place]- not paraplegia- need to get, requires it for going any distance.   6. Would be great for pt to get into [facility] outpatient intensive program. Only chance of getting more strength back at this point.  Thinking moving to [place].    7. F/U in 3 months- double appointment- MIGHT need botox of RLE- in future- not right now.    I spent a total of 40 minutes on visit- as detailed above.

## 2019-12-07 LAB — COMPREHENSIVE METABOLIC PANEL
ALT: 22 IU/L (ref 0–44)
AST: 18 IU/L (ref 0–40)
Albumin/Globulin Ratio: 1.7 (ref 1.2–2.2)
Albumin: 4.8 g/dL (ref 4.0–5.0)
Alkaline Phosphatase: 45 IU/L (ref 44–121)
BUN/Creatinine Ratio: 13 (ref 9–20)
BUN: 13 mg/dL (ref 6–20)
Bilirubin Total: 0.4 mg/dL (ref 0.0–1.2)
CO2: 27 mmol/L (ref 20–29)
Calcium: 9.8 mg/dL (ref 8.7–10.2)
Chloride: 99 mmol/L (ref 96–106)
Creatinine, Ser: 0.97 mg/dL (ref 0.76–1.27)
GFR calc Af Amer: 117 mL/min/{1.73_m2} (ref >59)
GFR calc non Af Amer: 101 mL/min/{1.73_m2} (ref >59)
Globulin, Total: 2.8 g/dL (ref 1.5–4.5)
Glucose: 130 mg/dL — ABNORMAL HIGH (ref 65–99)
Potassium: 4.8 mmol/L (ref 3.5–5.2)
Sodium: 139 mmol/L (ref 134–144)
Total Protein: 7.6 g/dL (ref 6.0–8.5)

## 2019-12-08 ENCOUNTER — Ambulatory Visit: Payer: BC Managed Care – PPO | Admitting: Occupational Therapy

## 2019-12-08 ENCOUNTER — Ambulatory Visit: Payer: BC Managed Care – PPO | Attending: Physical Medicine and Rehabilitation | Admitting: Physical Therapy

## 2019-12-08 ENCOUNTER — Other Ambulatory Visit: Payer: Self-pay

## 2019-12-08 ENCOUNTER — Encounter: Payer: Self-pay | Admitting: Occupational Therapy

## 2019-12-08 DIAGNOSIS — M6281 Muscle weakness (generalized): Secondary | ICD-10-CM | POA: Insufficient documentation

## 2019-12-08 DIAGNOSIS — R41842 Visuospatial deficit: Secondary | ICD-10-CM | POA: Diagnosis present

## 2019-12-08 DIAGNOSIS — R293 Abnormal posture: Secondary | ICD-10-CM | POA: Insufficient documentation

## 2019-12-08 DIAGNOSIS — R262 Difficulty in walking, not elsewhere classified: Secondary | ICD-10-CM | POA: Diagnosis present

## 2019-12-08 DIAGNOSIS — R278 Other lack of coordination: Secondary | ICD-10-CM

## 2019-12-08 DIAGNOSIS — R2689 Other abnormalities of gait and mobility: Secondary | ICD-10-CM | POA: Insufficient documentation

## 2019-12-08 DIAGNOSIS — R2681 Unsteadiness on feet: Secondary | ICD-10-CM

## 2019-12-08 DIAGNOSIS — D497 Neoplasm of unspecified behavior of endocrine glands and other parts of nervous system: Secondary | ICD-10-CM | POA: Diagnosis present

## 2019-12-08 DIAGNOSIS — R208 Other disturbances of skin sensation: Secondary | ICD-10-CM | POA: Diagnosis present

## 2019-12-08 NOTE — Therapy (Signed)
[facility] [address] [place], [place], [address] Phone: [phone]   Fax:  [phone]  Physical Therapy Treatment  Patient Details  Name: Terance Pomplun MRN: [id number] Date of Birth: 1985/01/10 Referring Provider (PT): Dr. Courtney Heys   Encounter Date: 12/08/2019   PT End of Session - 12/08/19 1626    Visit Number 21    Number of Visits 33    Date for PT Re-Evaluation 01/10/20    Authorization Type BCBS    PT Start Time 1445    PT Stop Time 1525    PT Time Calculation (min) 40 min    Equipment Utilized During Treatment Gait belt    Activity Tolerance Patient tolerated treatment well;Patient limited by fatigue    Behavior During Therapy [facility] for tasks assessed/performed           Past Medical History:  Diagnosis Date  . Anxiety   . Clonus   . Depression   . Headache   . Pneumonia    "walking"  . Quadriplegia and quadriparesis ([place])   . Spasticity   . Spinal cord tumor   . Wheelchair bound     Past Surgical History:  Procedure Laterality Date  . LAMINECTOMY N/A 06/01/2019   Procedure: Cervical six-seven Laminoplasty, biopsy and resection of intramedullary spinal cord tumor;  Surgeon: [name], MD;  Location: [facility];  Service: Neurosurgery;  Laterality: N/A;  . NO PAST SURGERIES      There were no vitals filed for this visit.   Subjective Assessment - 12/08/19 1450    Subjective Pt reports he has made a decision to go with the fold up & go electric wheelchair. Pt comes into clinic with [facility] as he only had room for the w/c over the rollator for plans to go to the movie theater later today. Pt was sore after last PT session.    Limitations Standing;Walking;House hold activities    How long can you sit comfortably? no issues    How long can you stand comfortably? <5 min    How long can you walk comfortably? <0.25 mile    Patient Stated Goals Progress walking    Currently in Pain? Yes                       [facility] Adult PT Treatment/Exercise - 12/08/19 0001      Knee/Hip Exercises: Machines for Strengthening   Total Gym Leg Press DL #80 x10; SL R&L 40# x10, 45# x10, 50# x10      Knee/Hip Exercises: Standing   Hip Abduction Stengthening;Both;2 sets;10 reps    Abduction Limitations yellow tband    Hip Extension Stengthening;Both;2 sets;10 reps    Extension Limitations yellow tband    Other Standing Knee Exercises monster walk forward & back x2 reps in // bars with yellow tband;                Balance Exercises - 12/08/19 0001      Balance Exercises: Standing   Standing Eyes Opened Wide (BOA);Foam/compliant surface;Head turns;30 secs;Narrow base of support (BOS) ; head turns & head nods x30 sec in each condition              PT Short Term Goals - 11/15/19 1759      PT SHORT TERM GOAL #1   Title Patient will be able to ambulate 115' with cane to improve short distance ambulation within home    Baseline  39' with min/mod A with quad cane due to multi LOB and scissoring 25% (10/13/19); 672' with cane and bilat GRAFOs (11/15/19)    Time 4    Period Weeks    Status Achieved    Target Date 10/18/19      PT SHORT TERM GOAL #2   Title Pt will demo <12 seconds on timed up and go with RW to improve functional mobility (target date 01/10/20)    Baseline 14 seconds RW (eval); 13 seconds rollator (10/13/19); 13.5 seconds SPC + bilat GRAFOs (11/15/19)    Time 8    Period Weeks    Status Revised    Target Date 01/10/20      PT SHORT TERM GOAL #3   Title Patient will be able to go up and down a ramp and curb step to be able to negotiate community    Baseline CGA with rollator for 3' long ramp, forward and over aerobic stepper acting as curb x10 (10/13/19)    Time 4    Period Weeks    Status Achieved    Target Date 10/18/19             PT Long Term Goals - 11/15/19 1800      PT LONG TERM GOAL #1   Title Patient will demo >50/56 on berg balance  scale to improve functional balance and reduce fall risk (Target date 01/10/20)    Baseline 40/56 (eval); 47/56 (11/15/19)    Time 8    Period Weeks    Status Revised    Target Date 01/10/20      PT LONG TERM GOAL #2   Title Patient will be able to ambulate >500 feet with cane to improve short distance community ambulation.    Baseline only walks with RW; 672' on 6 MWT with cane + GRAFO (11/15/19)    Time 8    Period Weeks    Status Achieved      PT LONG TERM GOAL #3   Title Patient will demo 4+/5 strength in bil LE grossly to improve functional strength with transfers (Target date 01/10/20)    Baseline 3+-4/5 grossly in bil LE    Time 8    Period Weeks    Status Revised    Target Date 01/10/20      PT LONG TERM GOAL #4   Title Patient will be able to go up and down 12 steps with use of 1-2 rails to be able to go up and down steps at home (Target date 01/10/20)    Baseline 2 steps (with one rail) (Eval); 4 steps, but very unsafe with descent (11/15/19)    Time 8    Period Weeks    Status Revised    Target Date 01/10/20      PT LONG TERM GOAL #5   Title Pt with goal to improve 6 MWT using rollator and GRAFOs to >/= 1000' or using SPC and GRAFOs to >/=800' (Target date 01/10/20)    Baseline 672' with [facility] & GRAFOs (11/15/19)    Time 8    Period Weeks    Status Revised    Target Date 01/10/20                 Plan - 12/08/19 1532    Clinical Impression Statement Treatment focused primarily on pt's strength and balance on compliant surface. Pt with GRAFOs on throughout the whole session. Provided pt yellow theraband exercises to progress his hip strengthening.  Personal Factors and Comorbidities Age;Past/Current Experience;Comorbidity 2;Time since onset of injury/illness/exacerbation;Transportation    Examination-Activity Limitations Bathing;Lift;Squat;Stairs;Stand;Transfers    Examination-Participation Restrictions Cleaning;Community  Activity;Driving;Laundry;Occupation;Shop;Yard Work    Rehab Potential Fair    PT Frequency 2x / week    PT Duration 8 weeks    PT Treatment/Interventions ADLs/Self Care Home Management;Gait training;Stair training;Functional mobility training;Therapeutic activities;Therapeutic exercise;Balance training;Neuromuscular re-education;Manual techniques;Wheelchair mobility training;Patient/family education;Passive range of motion;Energy conservation;Vestibular;Visual/perceptual remediation/compensation;Joint Manipulations    PT Next Visit Plan Work on R hip abduction strengthening, knee flex and extension strengthening. Continue to work on endurance. Work on Engineer, drilling and backwards movement with amb.    PT Home Exercise Plan Access Code: [email or web address]    Consulted and Agree with Plan of Care Patient           Patient will benefit from skilled therapeutic intervention in order to improve the following deficits and impairments:  Abnormal gait, Decreased activity tolerance, Decreased balance, Decreased cognition, Decreased mobility, Decreased endurance, Decreased coordination, Decreased range of motion, Decreased strength, Dizziness, Difficulty walking, Impaired sensation, Impaired tone, Impaired UE functional use, Postural dysfunction, Pain  Visit Diagnosis: Muscle weakness (generalized)  Other lack of coordination  Other disturbances of skin sensation  Unsteadiness on feet  Other abnormalities of gait and mobility  Difficulty in walking, not elsewhere classified     Problem List Patient Active Problem List   Diagnosis Date Noted  . Incomplete paraplegia ([place]) 11/08/2019  . Foot drop, bilateral 11/08/2019  . Spasticity 08/18/2019  . Chronic pain syndrome 08/18/2019  . Nerve pain 08/18/2019  . Clonus 07/21/2019  . Spinal cord tumor   . Pain   . Transaminitis   . Leukocytosis   . Neurogenic bowel   . Postoperative pain   . 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 Acute  incomplete quadriplegia ([facility])   . 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 Numbness 05/10/2019  . Weakness of left lower extremity 05/10/2019  . 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 Visual disturbance 05/10/2019    Tylah Mancillas April Ma L [place] PT, DPT 12/08/2019, 4:29 PM  [facility] [address] [place], [place], [address] Phone: [phone]   Fax:  [phone]  Name: Treshun Wold MRN: [id number] Date of Birth: 05/05/1985

## 2019-12-08 NOTE — Therapy (Signed)
[facility] [address] [place], [place], [address] Phone: [phone]   Fax:  [phone]  Occupational Therapy Treatment  Patient Details  Name: Jacob Lambert MRN: [id number] Date of Birth: [birth year] Referring Provider (OT): Dr. Courtney Heys   Encounter Date: 12/08/2019   OT End of Session - 12/08/19 1535    Visit Number 17    Number of Visits 25    Date for OT Re-Evaluation 01/01/20    Authorization Time Period 30 visit limit - week 10 of 12 (12/3)    OT Start Time 1539   pt in restroom   OT Stop Time 1615    OT Time Calculation (min) 36 min    Activity Tolerance Patient tolerated treatment well    Behavior During Therapy [facility] for tasks assessed/performed           Past Medical History:  Diagnosis Date  . Anxiety   . Clonus   . Depression   . Headache   . Pneumonia    "walking"  . Quadriplegia and quadriparesis ([place])   . Spasticity   . Spinal cord tumor   . Wheelchair bound     Past Surgical History:  Procedure Laterality Date  . LAMINECTOMY N/A 06/01/2019   Procedure: Cervical six-seven Laminoplasty, biopsy and resection of intramedullary spinal cord tumor;  Surgeon: [name], MD;  Location: [facility];  Service: Neurosurgery;  Laterality: N/A;  . NO PAST SURGERIES      There were no vitals filed for this visit.   Subjective Assessment - 12/08/19 1539    Subjective  "just the normal pain"    Pertinent History PMH depression    Limitations Fall Risk.    Patient Stated Goals "I want to have as much functional independence as possible" more dexterity and typing skills for work, wear tights and wear heels    Currently in Pain? Yes    Pain Score 4     Pain Location Hand    Pain Orientation Left    Pain Descriptors / Indicators Pins and needles;Shooting    Pain Type Neuropathic pain    Pain Onset More than a month ago    Pain Frequency Constant                         OT Treatments/Exercises (OP) - 12/08/19 1549      ADLs   Bathing pt reported procedure for showering and difficulties with time, energy and tasks. Pt reports dropping the soap frequently and taking extended time for drying self for safety      Exercises   Exercises Work Hardening      Work Hardening Exercises   UBE (Upper Arm Bike) 7 minutes level 3       Functional Reaching Activities   High Level reaching high level with stacking 1 inch blocks on top of standing mirrior with LUE and removing with RUE. Pt then stacked blocks on mirror simultaneously with BUE and removed simultaneously. Pt completed red and gree resistance clothespins on top of standing mirror with BUE. Activity targeted standing balance, functional high level reach and standing tolerance                    OT Short Term Goals - 12/04/19 1542      OT SHORT TERM GOAL #1   Title Pt will be independent with HEP for coordination and strengthening 11/06/19    Time 4  Period Weeks    Status Achieved   yellow theraputty 10/8   Target Date 11/06/19      OT SHORT TERM GOAL #2   Title Pt will increase grip strength in BUE by 5 lbs in order to increase ability to manage clothing (tights, socks, pants) and increased independence with ADLs.    Baseline RUE 33.2 lbs LUE 35.4 lbs    Time 4    Period Weeks    Status Achieved   44.3 LUE; 52 RUE     OT SHORT TERM GOAL #3   Title Pt will improve functional use of LUE as evidenced by performing 50 blocks or more on Box & Blocks test.    Baseline LUE 43 blocks    Time 4    Period Weeks    Status Achieved   70     OT SHORT TERM GOAL #4   Title Pt will perform complex meal prep and/or housekeeping tasks with mod I and showing good safety awareness in order to increase independence with IADLs.    Time 4    Period Weeks    Status On-going      OT SHORT TERM GOAL #5   Title Pt will verbalize compensatory strategies for visual deficits.    Time 4    Period Weeks     Status Achieved      OT SHORT TERM GOAL #6   Title Pt will improve 9 hole peg test score to 33 seconds or less with BUE in order to increase fine motor coordination in order to increase independence with fasteners and jewelry, etc.    Time 4    Period Weeks    Status Achieved   LUE 27.09 seconds RUE 30.06 seconds     OT SHORT TERM GOAL #7   Title Pt will obtain a lightweight object from higher level cabinet with LUE, dominant hand, for increasing independence with ADLs and IADLs.    Time 4    Period Weeks    Status Achieved      OT SHORT TERM GOAL #8   Title Pt will increase keyboarding/typing skills to completing 30 wpm in order to prep for back to work.    Time 4    Period Weeks    Status Achieved   43 wpm            OT Long Term Goals - 12/04/19 1543      OT LONG TERM GOAL #1   Title Pt will be independent with updated HEP for coordination and strengthening 01/02/20    Time 12    Period Weeks    Status On-going      OT LONG TERM GOAL #2   Title Pt will increase grip strength in BUE by 10 lbs in order to increase ability to manage clothing (tights, socks, pants) and increased independence with ADLs.    Baseline LUE 35.4 RUE 33.2    Time 12    Period Weeks    Status Achieved   RUE 55.9 LUE 45.8     OT LONG TERM GOAL #3   Title Pt will improve functional use of LUE as evidenced by performing 60 blocks or more on Box & Blocks test.    Baseline LUE 43    Time 12    Period Weeks    Status Achieved   70     OT LONG TERM GOAL #4   Title Pt will improve 9 hole peg test score to  25 seconds or less with BUE in order to increase fine motor coordination in order to increase independence with fasteners and jewelry, etc.    Baseline RUE 37.84 LUE 38.57    Time 12    Period Weeks    Status Achieved   LUE 20.44 RUE 19.34     OT LONG TERM GOAL #5   Title Pt will obtain an object from cabinet greater than or equal to 5lbs with LUE, dominant hand, for increasing independence  with ADLs and IADLs.    Time 12    Period Weeks    Status New      OT LONG TERM GOAL #6   Title Pt will increase keyboarding/typing skills to completing 50 wpm in order to prep for back to work.    Time 12    Period Weeks    Status Achieved                 Plan - 12/08/19 1538    Clinical Impression Statement Pt continues to work towards remaining goals. Pt is making progress with fine motor coordination and strength and endurance however continues to present with deficits in these areas.    OT Occupational Profile and History Detailed Assessment- Review of Records and additional review of physical, cognitive, psychosocial history related to current functional performance    Occupational performance deficits (Please refer to evaluation for details): IADL's;ADL's;Work;Social Participation;Leisure    Body Structure / Function / Physical Skills Strength;Gait;Decreased knowledge of use of DME;ADL;Balance;Dexterity;GMC;Pain;Tone;Body mechanics;UE functional use;Endurance;IADL;ROM;Vision;Coordination;Flexibility;Mobility;Sensation;FMC    Rehab Potential Good    Clinical Decision Making Limited treatment options, no task modification necessary    Comorbidities Affecting Occupational Performance: May have comorbidities impacting occupational performance    Modification or Assistance to Complete Evaluation  No modification of tasks or assist necessary to complete eval    OT Frequency 2x / week    OT Duration 12 weeks    OT Treatment/Interventions Self-care/ADL training;Moist Heat;Fluidtherapy;DME and/or AE instruction;Balance training;Therapeutic activities;Gait Training;Ultrasound;Therapeutic exercise;Scar mobilization;Visual/perceptual remediation/compensation;Passive range of motion;Functional Mobility Training;Neuromuscular education;Electrical Stimulation;Paraffin;Energy conservation;Manual Therapy;Patient/family education    Plan strengthening, arm bike, endurance    Consulted and Agree  with Plan of Care Patient           Patient will benefit from skilled therapeutic intervention in order to improve the following deficits and impairments:   Body Structure / Function / Physical Skills: Strength, Gait, Decreased knowledge of use of DME, ADL, Balance, Dexterity, GMC, Pain, Tone, Body mechanics, UE functional use, Endurance, IADL, ROM, Vision, Coordination, Flexibility, Mobility, Sensation, [facility]       Visit Diagnosis: Other lack of coordination  Muscle weakness (generalized)  Visuospatial deficit  Other disturbances of skin sensation  Unsteadiness on feet    Problem List Patient Active Problem List   Diagnosis Date Noted  . Incomplete paraplegia ([place]) 11/08/2019  . Foot drop, bilateral 11/08/2019  . Spasticity 08/18/2019  . Chronic pain syndrome 08/18/2019  . Nerve pain 08/18/2019  . Clonus 07/21/2019  . Spinal cord tumor   . Pain   . Transaminitis   . Leukocytosis   . Neurogenic bowel   . Postoperative pain   . 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 Acute incomplete quadriplegia ([facility])   . 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 Numbness 05/10/2019  . Weakness of left lower extremity 05/10/2019  . 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 Visual disturbance 05/10/2019    Zachery Conch MOT, OTR/L  12/08/2019, 4:21 PM  [facility]  [address] [place], [place], [address] Phone: [phone]   Fax:  [phone]  Name: Jacob Lambert MRN: [id number] Date of Birth: [birth year]

## 2019-12-11 ENCOUNTER — Ambulatory Visit: Payer: BC Managed Care – PPO

## 2019-12-11 ENCOUNTER — Encounter: Payer: Self-pay | Admitting: Occupational Therapy

## 2019-12-11 ENCOUNTER — Other Ambulatory Visit: Payer: Self-pay

## 2019-12-11 ENCOUNTER — Ambulatory Visit: Payer: BC Managed Care – PPO | Admitting: Occupational Therapy

## 2019-12-11 DIAGNOSIS — D497 Neoplasm of unspecified behavior of endocrine glands and other parts of nervous system: Secondary | ICD-10-CM | POA: Diagnosis not present

## 2019-12-11 DIAGNOSIS — M6281 Muscle weakness (generalized): Secondary | ICD-10-CM

## 2019-12-11 DIAGNOSIS — R293 Abnormal posture: Secondary | ICD-10-CM

## 2019-12-11 DIAGNOSIS — R2681 Unsteadiness on feet: Secondary | ICD-10-CM | POA: Diagnosis not present

## 2019-12-11 DIAGNOSIS — R41842 Visuospatial deficit: Secondary | ICD-10-CM

## 2019-12-11 DIAGNOSIS — R278 Other lack of coordination: Secondary | ICD-10-CM | POA: Diagnosis not present

## 2019-12-11 DIAGNOSIS — R262 Difficulty in walking, not elsewhere classified: Secondary | ICD-10-CM | POA: Diagnosis not present

## 2019-12-11 DIAGNOSIS — R2689 Other abnormalities of gait and mobility: Secondary | ICD-10-CM | POA: Diagnosis not present

## 2019-12-11 DIAGNOSIS — R208 Other disturbances of skin sensation: Secondary | ICD-10-CM

## 2019-12-11 NOTE — Therapy (Signed)
[facility] [address] [place], [place], [address] Phone: [phone]   Fax:  [phone]  Occupational Therapy Treatment  Patient Details  Name: Jacob Lambert MRN: [id number] Date of Birth: December 23, 1985 Referring Provider (OT): Dr. Courtney Heys   Encounter Date: 12/11/2019   OT End of Session - 12/11/19 1538    Visit Number 18    Number of Visits 25    Date for OT Re-Evaluation 01/01/20    Authorization Time Period 30 visit limit - week 10 of 12 (12/6)    OT Start Time 1536    OT Stop Time 1615    OT Time Calculation (min) 39 min    Activity Tolerance Patient tolerated treatment well    Behavior During Therapy [facility] for tasks assessed/performed           Past Medical History:  Diagnosis Date  . Anxiety   . Clonus   . Depression   . Headache   . Pneumonia    "walking"  . Quadriplegia and quadriparesis ([place])   . Spasticity   . Spinal cord tumor   . Wheelchair bound     Past Surgical History:  Procedure Laterality Date  . LAMINECTOMY N/A 06/01/2019   Procedure: Cervical six-seven Laminoplasty, biopsy and resection of intramedullary spinal cord tumor;  Surgeon: [name], MD;  Location: [facility];  Service: Neurosurgery;  Laterality: N/A;  . NO PAST SURGERIES      There were no vitals filed for this visit.   Subjective Assessment - 12/11/19 1539    Subjective  "I ordered the electric chair so I should be getting it as early as Friday."    Pertinent History PMH depression    Limitations Fall Risk.    Patient Stated Goals "I want to have as much functional independence as possible" more dexterity and typing skills for work, wear tights and wear heels    Currently in Pain? Yes    Pain Score 4     Pain Location Hand    Pain Orientation Left    Pain Descriptors / Indicators Pins and needles;Shooting    Pain Type Neuropathic pain    Pain Onset More than a month ago    Pain Frequency Constant                         OT Treatments/Exercises (OP) - 12/11/19 1713      ADLs   Functional Mobility worked on functional mobility in the kitchen with reaching high and low for items in cabinets. Pt continues to required supervision and SBA for instability and balance. Recommended patient use rollator for sitting on for reaching to lower drawers and cabinets for items.      Exercises   Exercises Work Hardening      Shoulder Exercises: Seated   Extension Strengthening;Both;10 reps;Theraband    Theraband Level (Shoulder Extension) Level 2 (Red)    Retraction Strengthening;Both;10 reps;Theraband    Theraband Level (Shoulder Retraction) Level 2 (Red)    Horizontal ABduction Strengthening;Both;10 reps;Theraband    Theraband Level (Shoulder Horizontal ABduction) Level 2 (Red)    Flexion Strengthening;Both;10 reps;Theraband    Theraband Level (Shoulder Flexion) Level 2 (Red)      Elbow Exercises   Other elbow exercises bicep curls x 10 with red theraband      Work Hardening Exercises   UBE (Upper Arm Bike) 8 minutes (forward and backward) on level 3  OT Education - 12/11/19 1601    Education Details Red theraband issued - continue HEP    Person(s) Educated Patient    Methods Explanation;Demonstration    Comprehension Verbalized understanding;Returned demonstration            OT Short Term Goals - 12/11/19 1541      OT SHORT TERM GOAL #1   Title Pt will be independent with HEP for coordination and strengthening 11/06/19    Time 4    Period Weeks    Status Achieved   yellow theraputty 10/8   Target Date 11/06/19      OT SHORT TERM GOAL #2   Title Pt will increase grip strength in BUE by 5 lbs in order to increase ability to manage clothing (tights, socks, pants) and increased independence with ADLs.    Baseline RUE 33.2 lbs LUE 35.4 lbs    Time 4    Period Weeks    Status Achieved   44.3 LUE; 52 RUE     OT SHORT TERM GOAL #3   Title Pt  will improve functional use of LUE as evidenced by performing 50 blocks or more on Box & Blocks test.    Baseline LUE 43 blocks    Time 4    Period Weeks    Status Achieved   70     OT SHORT TERM GOAL #4   Title Pt will perform complex meal prep and/or housekeeping tasks with mod I and showing good safety awareness in order to increase independence with IADLs.    Time 4    Period Weeks    Status Not Met   supervision for safety     OT SHORT TERM GOAL #5   Title Pt will verbalize compensatory strategies for visual deficits.    Time 4    Period Weeks    Status Achieved      OT SHORT TERM GOAL #6   Title Pt will improve 9 hole peg test score to 33 seconds or less with BUE in order to increase fine motor coordination in order to increase independence with fasteners and jewelry, etc.    Time 4    Period Weeks    Status Achieved   LUE 27.09 seconds RUE 30.06 seconds     OT SHORT TERM GOAL #7   Title Pt will obtain a lightweight object from higher level cabinet with LUE, dominant hand, for increasing independence with ADLs and IADLs.    Time 4    Period Weeks    Status Achieved      OT SHORT TERM GOAL #8   Title Pt will increase keyboarding/typing skills to completing 30 wpm in order to prep for back to work.    Time 4    Period Weeks    Status Achieved   43 wpm            OT Long Term Goals - 12/04/19 1543      OT LONG TERM GOAL #1   Title Pt will be independent with updated HEP for coordination and strengthening 01/02/20    Time 12    Period Weeks    Status On-going      OT LONG TERM GOAL #2   Title Pt will increase grip strength in BUE by 10 lbs in order to increase ability to manage clothing (tights, socks, pants) and increased independence with ADLs.    Baseline LUE 35.4 RUE 33.2    Time 12    Period  Weeks    Status Achieved   RUE 55.9 LUE 45.8     OT LONG TERM GOAL #3   Title Pt will improve functional use of LUE as evidenced by performing 60 blocks or more on  Box & Blocks test.    Baseline LUE 43    Time 12    Period Weeks    Status Achieved   70     OT LONG TERM GOAL #4   Title Pt will improve 9 hole peg test score to 25 seconds or less with BUE in order to increase fine motor coordination in order to increase independence with fasteners and jewelry, etc.    Baseline RUE 37.84 LUE 38.57    Time 12    Period Weeks    Status Achieved   LUE 20.44 RUE 19.34     OT LONG TERM GOAL #5   Title Pt will obtain an object from cabinet greater than or equal to 5lbs with LUE, dominant hand, for increasing independence with ADLs and IADLs.    Time 12    Period Weeks    Status New      OT LONG TERM GOAL #6   Title Pt will increase keyboarding/typing skills to completing 50 wpm in order to prep for back to work.    Time 12    Period Weeks    Status Achieved                 Plan - 12/11/19 1606    Clinical Impression Statement Pt is making progress with strength and endurance.    OT Occupational Profile and History Detailed Assessment- Review of Records and additional review of physical, cognitive, psychosocial history related to current functional performance    Occupational performance deficits (Please refer to evaluation for details): IADL's;ADL's;Work;Social Participation;Leisure    Body Structure / Function / Physical Skills Strength;Gait;Decreased knowledge of use of DME;ADL;Balance;Dexterity;GMC;Pain;Tone;Body mechanics;UE functional use;Endurance;IADL;ROM;Vision;Coordination;Flexibility;Mobility;Sensation;FMC    Rehab Potential Good    Clinical Decision Making Limited treatment options, no task modification necessary    Comorbidities Affecting Occupational Performance: May have comorbidities impacting occupational performance    Modification or Assistance to Complete Evaluation  No modification of tasks or assist necessary to complete eval    OT Frequency 2x / week    OT Duration 12 weeks    OT Treatment/Interventions Self-care/ADL  training;Moist Heat;Fluidtherapy;DME and/or AE instruction;Balance training;Therapeutic activities;Gait Training;Ultrasound;Therapeutic exercise;Scar mobilization;Visual/perceptual remediation/compensation;Passive range of motion;Functional Mobility Training;Neuromuscular education;Electrical Stimulation;Paraffin;Energy conservation;Manual Therapy;Patient/family education    Plan strengthening, arm bike, endurance    Consulted and Agree with Plan of Care Patient           Patient will benefit from skilled therapeutic intervention in order to improve the following deficits and impairments:   Body Structure / Function / Physical Skills: Strength, Gait, Decreased knowledge of use of DME, ADL, Balance, Dexterity, GMC, Pain, Tone, Body mechanics, UE functional use, Endurance, IADL, ROM, Vision, Coordination, Flexibility, Mobility, Sensation, [facility]       Visit Diagnosis: Other lack of coordination  Muscle weakness (generalized)  Visuospatial deficit  Other disturbances of skin sensation  Unsteadiness on feet  Other abnormalities of gait and mobility    Problem List Patient Active Problem List   Diagnosis Date Noted  . Incomplete paraplegia ([place]) 11/08/2019  . Foot drop, bilateral 11/08/2019  . Spasticity 08/18/2019  . Chronic pain syndrome 08/18/2019  . Nerve pain 08/18/2019  . Clonus 07/21/2019  . Spinal cord tumor   . Pain   .  Transaminitis   . Leukocytosis   . Neurogenic bowel   . Postoperative pain   . 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 Acute incomplete quadriplegia ([facility])   . 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 Numbness 05/10/2019  . Weakness of left lower extremity 05/10/2019  . 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 Visual disturbance 05/10/2019    Zachery Conch MOT, OTR/L  12/11/2019, 5:18 PM  [facility] [address] [place], [place], [address] Phone: [phone]   Fax:  [phone]  Name: Jacob Lambert MRN: [id number] Date of Birth: Nov 25, 1985

## 2019-12-11 NOTE — Therapy (Signed)
[facility] [address] [place], [place], [address] Phone: [phone]   Fax:  [phone]  Physical Therapy Treatment  Patient Details  Name: Jacob Lambert MRN: [id number] Date of Birth: [birth year] Referring Provider (PT): Dr. Courtney Heys   Encounter Date: 12/11/2019   PT End of Session - 12/11/19 1620    Visit Number 22    Number of Visits 33    Date for PT Re-Evaluation 01/10/20    Authorization Type BCBS    PT Start Time 1615    PT Stop Time 1700    PT Time Calculation (min) 45 min    Equipment Utilized During Treatment Gait belt    Activity Tolerance Patient tolerated treatment well;Patient limited by fatigue    Behavior During Therapy [facility] for tasks assessed/performed           Past Medical History:  Diagnosis Date  . Anxiety   . Clonus   . Depression   . Headache   . Pneumonia    "walking"  . Quadriplegia and quadriparesis ([place])   . Spasticity   . Spinal cord tumor   . Wheelchair bound     Past Surgical History:  Procedure Laterality Date  . LAMINECTOMY N/A 06/01/2019   Procedure: Cervical six-seven Laminoplasty, biopsy and resection of intramedullary spinal cord tumor;  Surgeon: [name], MD;  Location: [place];  Service: Neurosurgery;  Laterality: N/A;  . NO PAST SURGERIES      There were no vitals filed for this visit.   Subjective Assessment - 12/11/19 1615    Subjective Pt reports he has made a decision to go with the fold up & go electric wheelchair. Pt comes into clinic with [facility] as he only had room for the w/c over the rollator for plans to go to the movie theater later today. Pt was sore after last PT session.    Limitations Standing;Walking;House hold activities    How long can you sit comfortably? no issues    How long can you stand comfortably? <5 min    How long can you walk comfortably? <0.25 mile    Patient Stated Goals Progress walking                   SL hip abduction: 2lbs 3 x 10 R and L SLR: 0lbs 3 x 10 R and L, AA on R Uni leg press: 50lbs 2 x 10 R and L Bil leg press: 90lbs 2 x 10  Tandem walking fwd and bwd: 10 x 10 laps 1 HHA Lateral walking 10 x 10 steps no HHA                      PT Short Term Goals - 11/15/19 1759      PT SHORT TERM GOAL #1   Title Patient will be able to ambulate 115' with cane to improve short distance ambulation within home    Baseline 115' with min/mod A with quad cane due to multi LOB and scissoring 25% (10/13/19); 672' with cane and bilat GRAFOs (11/15/19)    Time 4    Period Weeks    Status Achieved    Target Date 10/18/19      PT SHORT TERM GOAL #2   Title Pt will demo <12 seconds on timed up and go with RW to improve functional mobility (target date 01/10/20)    Baseline 14 seconds RW (eval); 13 seconds rollator (10/13/19); 13.5 seconds  SPC + bilat GRAFOs (11/15/19)    Time 8    Period Weeks    Status Revised    Target Date 01/10/20      PT SHORT TERM GOAL #3   Title Patient will be able to go up and down a ramp and curb step to be able to negotiate community    Baseline CGA with rollator for 3' long ramp, forward and over aerobic stepper acting as curb x10 (10/13/19)    Time 4    Period Weeks    Status Achieved    Target Date 10/18/19             PT Long Term Goals - 11/15/19 1800      PT LONG TERM GOAL #1   Title Patient will demo >50/56 on berg balance scale to improve functional balance and reduce fall risk (Target date 01/10/20)    Baseline 40/56 (eval); 47/56 (11/15/19)    Time 8    Period Weeks    Status Revised    Target Date 01/10/20      PT LONG TERM GOAL #2   Title Patient will be able to ambulate >500 feet with cane to improve short distance community ambulation.    Baseline only walks with RW; 672' on 6 MWT with cane + GRAFO (11/15/19)    Time 8    Period Weeks    Status Achieved      PT LONG TERM GOAL #3   Title Patient will  demo 4+/5 strength in bil LE grossly to improve functional strength with transfers (Target date 01/10/20)    Baseline 3+-4/5 grossly in bil LE    Time 8    Period Weeks    Status Revised    Target Date 01/10/20      PT LONG TERM GOAL #4   Title Patient will be able to go up and down 12 steps with use of 1-2 rails to be able to go up and down steps at home (Target date 01/10/20)    Baseline 2 steps (with one rail) (Eval); 4 steps, but very unsafe with descent (11/15/19)    Time 8    Period Weeks    Status Revised    Target Date 01/10/20      PT LONG TERM GOAL #5   Title Pt with goal to improve 6 MWT using rollator and GRAFOs to >/= 1000' or using SPC and GRAFOs to >/=800' (Target date 01/10/20)    Baseline 672' with [facility] & GRAFOs (11/15/19)    Time 8    Period Weeks    Status Revised    Target Date 01/10/20                 Plan - 12/11/19 1615    Clinical Impression Statement Patient has been seen for total of 22 sessions in physical therapy. Patient has significant quad lag in bil quadruceps muscles (R>L). Patient will benefit from trial of Neuromuscular electrical stimulation to improve musle fiber activation and improve knee stability in bil knees to reduce buckling of knees during gait and transfers.    Personal Factors and Comorbidities Age;Past/Current Experience;Comorbidity 2;Time since onset of injury/illness/exacerbation;Transportation    Examination-Activity Limitations Bathing;Lift;Squat;Stairs;Stand;Transfers    Examination-Participation Restrictions Cleaning;Community Activity;Driving;Laundry;Occupation;Shop;Yard Work    Rehab Potential Fair    PT Frequency 2x / week    PT Duration 8 weeks    PT Treatment/Interventions ADLs/Self Care Home Management;Gait training;Stair training;Functional mobility training;Therapeutic activities;Therapeutic exercise;Balance training;Neuromuscular re-education;Manual techniques;Wheelchair mobility  training;Patient/family education;Passive  range of motion;Energy conservation;Vestibular;Visual/perceptual remediation/compensation;Joint Manipulations;Biofeedback;Electrical Stimulation    PT Next Visit Plan Note sent to MD to add NMES to treatment on 12/11/19. Next few sessions: try Bioness electrical stimulation with SLR, SAQ, functional SLS to improve knee stability    PT Home Exercise Plan Access Code: [email or web address]; [email or web address]    Consulted and Agree with Plan of Care Patient           Patient will benefit from skilled therapeutic intervention in order to improve the following deficits and impairments:  Abnormal gait, Decreased activity tolerance, Decreased balance, Decreased cognition, Decreased mobility, Decreased endurance, Decreased coordination, Decreased range of motion, Decreased strength, Dizziness, Difficulty walking, Impaired sensation, Impaired tone, Impaired UE functional use, Postural dysfunction, Pain  Visit Diagnosis: Muscle weakness (generalized)  Unsteadiness on feet  Other abnormalities of gait and mobility  Difficulty in walking, not elsewhere classified  Spinal cord tumor  Abnormal posture     Problem List Patient Active Problem List   Diagnosis Date Noted  . Incomplete paraplegia ([place]) 11/08/2019  . Foot drop, bilateral 11/08/2019  . Spasticity 08/18/2019  . Chronic pain syndrome 08/18/2019  . Nerve pain 08/18/2019  . Clonus 07/21/2019  . Spinal cord tumor   . Pain   . Transaminitis   . Leukocytosis   . Neurogenic bowel   . Postoperative pain   . 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 Acute incomplete quadriplegia ([facility])   . 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 Numbness 05/10/2019  . Weakness of left lower extremity 05/10/2019  . 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 Visual disturbance 05/10/2019    Kerrie Pleasure, PT 12/11/2019, 4:48 PM  [facility] [address] [place], [place], [address] Phone: [phone]   Fax:   [phone]  Name: Labrian Torregrossa MRN: [id number] Date of Birth: Mar 27, 1985

## 2019-12-15 ENCOUNTER — Other Ambulatory Visit: Payer: Self-pay

## 2019-12-15 ENCOUNTER — Encounter: Payer: Self-pay | Admitting: Physical Therapy

## 2019-12-15 ENCOUNTER — Ambulatory Visit: Payer: BC Managed Care – PPO | Admitting: Occupational Therapy

## 2019-12-15 ENCOUNTER — Ambulatory Visit: Payer: BC Managed Care – PPO | Admitting: Physical Therapy

## 2019-12-15 ENCOUNTER — Encounter: Payer: Self-pay | Admitting: Occupational Therapy

## 2019-12-15 DIAGNOSIS — R2681 Unsteadiness on feet: Secondary | ICD-10-CM | POA: Diagnosis not present

## 2019-12-15 DIAGNOSIS — M6281 Muscle weakness (generalized): Secondary | ICD-10-CM | POA: Diagnosis not present

## 2019-12-15 DIAGNOSIS — R208 Other disturbances of skin sensation: Secondary | ICD-10-CM | POA: Diagnosis not present

## 2019-12-15 DIAGNOSIS — R2689 Other abnormalities of gait and mobility: Secondary | ICD-10-CM

## 2019-12-15 DIAGNOSIS — R262 Difficulty in walking, not elsewhere classified: Secondary | ICD-10-CM | POA: Diagnosis not present

## 2019-12-15 DIAGNOSIS — R278 Other lack of coordination: Secondary | ICD-10-CM

## 2019-12-15 DIAGNOSIS — R293 Abnormal posture: Secondary | ICD-10-CM | POA: Diagnosis not present

## 2019-12-15 DIAGNOSIS — R41842 Visuospatial deficit: Secondary | ICD-10-CM

## 2019-12-15 DIAGNOSIS — D497 Neoplasm of unspecified behavior of endocrine glands and other parts of nervous system: Secondary | ICD-10-CM | POA: Diagnosis not present

## 2019-12-15 NOTE — Therapy (Signed)
[facility] [address] [place], [place], [address] Phone: [phone]   Fax:  [phone]  Physical Therapy Treatment  Patient Details  Name: Jacob Lambert MRN: [id number] Date of Birth: 20-May-1985 Referring Provider (PT): Dr. Courtney Heys   Encounter Date: 12/15/2019   PT End of Session - 12/15/19 1642    Visit Number 23    Number of Visits 33    Date for PT Re-Evaluation 01/10/20    Authorization Type BCBS    PT Start Time 1531    PT Stop Time 1615    PT Time Calculation (min) 44 min    Equipment Utilized During Treatment Gait belt    Activity Tolerance Patient tolerated treatment well;Patient limited by fatigue    Behavior During Therapy [facility] for tasks assessed/performed           Past Medical History:  Diagnosis Date  . Anxiety   . Clonus   . Depression   . Headache   . Pneumonia    "walking"  . Quadriplegia and quadriparesis ([place])   . Spasticity   . Spinal cord tumor   . Wheelchair bound     Past Surgical History:  Procedure Laterality Date  . LAMINECTOMY N/A 06/01/2019   Procedure: Cervical six-seven Laminoplasty, biopsy and resection of intramedullary spinal cord tumor;  Surgeon: [name], MD;  Location: [facility];  Service: Neurosurgery;  Laterality: N/A;  . NO PAST SURGERIES      There were no vitals filed for this visit.   Subjective Assessment - 12/15/19 1531    Subjective No new complaints. Got his new power chair and has been trying it out. Plans to go around town to test it this weekend. No falls.    Limitations Standing;Walking;House hold activities    How long can you sit comfortably? no issues    How long can you stand comfortably? <5 min    How long can you walk comfortably? <0.25 mile    Patient Stated Goals Progress walking    Currently in Pain? Yes    Pain Score 6     Pain Location Generalized   multiple body spots   Pain Descriptors / Indicators Aching    Pain  Type Acute pain;Chronic pain;Neuropathic pain    Pain Onset Other (comment)   varies with onset depending on the site   Pain Frequency Intermittent    Aggravating Factors  slept wrong, increased activity, brace wear    Pain Relieving Factors pain meds/patch, CBD creame/oral droplets                [facility] Adult PT Treatment/Exercise - 12/15/19 1534      Transfers   Transfers Sit to Stand;Stand to Sit    Sit to Stand 5: Supervision;From chair/3-in-1;Without upper extremity assist;From bed    Stand to Sit 5: Supervision;With upper extremity assist;With armrests;To bed;To chair/3-in-1      Ambulation/Gait   Ambulation/Gait Yes    Ambulation/Gait Assistance 6: Modified independent (Device/Increase time)    Ambulation/Gait Assistance Details around gym with session    Assistive device Rollator    Gait Pattern Step-through pattern;Narrow base of support;Lateral trunk lean to right    Ambulation Surface Level;Indoor      Knee/Hip Exercises: Supine   Short Arc Quad Sets AROM;Strengthening;Both;1 set;10 reps;Limitations    Short Arc [employer] Limitations concurrent with Bioness to bil quads and ant tib. performed for 5 sec holds with 8 sec rest between each rep.  cues for slow and controlled movements.    Bridges AROM;Strengthening;Both;2 sets;10 reps;Limitations    Bridges Limitations concurrent with Bioness to bil quads/ant tib. assist needed to stabilize knees with each rep. cues for slow, controlled movements.      Theme park manager bil ant tib and Engineer, building services for increased muscle and nerve activation    Electrical Stimulation Parameters refer to tablet 1 for adjusted parameters; quick fit electrodes    Electrical Stimulation Goals Strength;Neuromuscular facilitation               PT Short Term Goals - 11/15/19 1759      PT SHORT TERM GOAL #1   Title Patient will be able to ambulate 115' with cane to improve  short distance ambulation within home    Baseline 115' with min/mod A with quad cane due to multi LOB and scissoring 25% (10/13/19); 672' with cane and bilat GRAFOs (11/15/19)    Time 4    Period Weeks    Status Achieved    Target Date 10/18/19      PT SHORT TERM GOAL #2   Title Pt will demo <12 seconds on timed up and go with RW to improve functional mobility (target date 01/10/20)    Baseline 14 seconds RW (eval); 13 seconds rollator (10/13/19); 13.5 seconds SPC + bilat GRAFOs (11/15/19)    Time 8    Period Weeks    Status Revised    Target Date 01/10/20      PT SHORT TERM GOAL #3   Title Patient will be able to go up and down a ramp and curb step to be able to negotiate community    Baseline CGA with rollator for 3' long ramp, forward and over aerobic stepper acting as curb x10 (10/13/19)    Time 4    Period Weeks    Status Achieved    Target Date 10/18/19             PT Long Term Goals - 11/15/19 1800      PT LONG TERM GOAL #1   Title Patient will demo >50/56 on berg balance scale to improve functional balance and reduce fall risk (Target date 01/10/20)    Baseline 40/56 (eval); 47/56 (11/15/19)    Time 8    Period Weeks    Status Revised    Target Date 01/10/20      PT LONG TERM GOAL #2   Title Patient will be able to ambulate >500 feet with cane to improve short distance community ambulation.    Baseline only walks with RW; 672' on 6 MWT with cane + GRAFO (11/15/19)    Time 8    Period Weeks    Status Achieved      PT LONG TERM GOAL #3   Title Patient will demo 4+/5 strength in bil LE grossly to improve functional strength with transfers (Target date 01/10/20)    Baseline 3+-4/5 grossly in bil LE    Time 8    Period Weeks    Status Revised    Target Date 01/10/20      PT LONG TERM GOAL #4   Title Patient will be able to go up and down 12 steps with use of 1-2 rails to be able to go up and down steps at home (Target date 01/10/20)    Baseline 2 steps (with one rail)  (Eval); 4 steps, but very unsafe with descent (11/15/19)  Time 8    Period Weeks    Status Revised    Target Date 01/10/20      PT LONG TERM GOAL #5   Title Pt with goal to improve 6 MWT using rollator and GRAFOs to >/= 1000' or using SPC and GRAFOs to >/=800' (Target date 01/10/20)    Baseline 672' with [facility] & GRAFOs (11/15/19)    Time 8    Period Weeks    Status Revised    Target Date 01/10/20                 Plan - 12/15/19 1644    Clinical Impression Statement Today's skilled session focuses on set up of Bioness to bil LE's for increased muscle/nerve activation and used concurrently with ex's for strengthening. No issues noted or reported in session. The pt responded well with use of Bioness and will plan to continue with it's use for strengthening. The pt is progressing toward goals and should benefit from continued PT to progress toward unmet goals.    Personal Factors and Comorbidities Age;Past/Current Experience;Comorbidity 2;Time since onset of injury/illness/exacerbation;Transportation    Examination-Activity Limitations Bathing;Lift;Squat;Stairs;Stand;Transfers    Examination-Participation Restrictions Cleaning;Community Activity;Driving;Laundry;Occupation;Shop;Yard Work    Rehab Potential Fair    PT Frequency 2x / week    PT Duration 8 weeks    PT Treatment/Interventions ADLs/Self Care Home Management;Gait training;Stair training;Functional mobility training;Therapeutic activities;Therapeutic exercise;Balance training;Neuromuscular re-education;Manual techniques;Wheelchair mobility training;Patient/family education;Passive range of motion;Energy conservation;Vestibular;Visual/perceptual remediation/compensation;Joint Manipulations;Biofeedback;Electrical Stimulation    PT Next Visit Plan continue with use of Bioness to bil LE"s for strengthening: supine, sitting, standing. progress to use with balance as able.    PT Home Exercise Plan Access Code: [email or web address]     Consulted and Agree with Plan of Care Patient           Patient will benefit from skilled therapeutic intervention in order to improve the following deficits and impairments:  Abnormal gait,Decreased activity tolerance,Decreased balance,Decreased cognition,Decreased mobility,Decreased endurance,Decreased coordination,Decreased range of motion,Decreased strength,Dizziness,Difficulty walking,Impaired sensation,Impaired tone,Impaired UE functional use,Postural dysfunction,Pain  Visit Diagnosis: Muscle weakness (generalized)  Other disturbances of skin sensation  Unsteadiness on feet     Problem List Patient Active Problem List   Diagnosis Date Noted  . Incomplete paraplegia ([place]) 11/08/2019  . Foot drop, bilateral 11/08/2019  . Spasticity 08/18/2019  . Chronic pain syndrome 08/18/2019  . Nerve pain 08/18/2019  . Clonus 07/21/2019  . Spinal cord tumor   . Pain   . Transaminitis   . Leukocytosis   . Neurogenic bowel   . Postoperative pain   . 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 Acute incomplete quadriplegia ([facility])   . 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 Numbness 05/10/2019  . Weakness of left lower extremity 05/10/2019  . 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 Visual disturbance 05/10/2019    Willow Ora, PTA, [facility] Outpatient Neuro [facility] [address], [place], [place] [address] [phone] 12/15/19, 4:47 PM Name: Jacob Lambert MRN: [id number] Date of Birth: 23-Mar-1985

## 2019-12-15 NOTE — Therapy (Signed)
[facility] [address] [place], [place], [address] Phone: [phone]   Fax:  [phone]  Occupational Therapy Treatment  Patient Details  Name: Jacob Lambert MRN: [id number] Date of Birth: 1985-12-21 Referring Provider (OT): Dr. Courtney Heys   Encounter Date: 12/15/2019   OT End of Session - 12/15/19 1452    Visit Number 19    Number of Visits 25    Date for OT Re-Evaluation 01/01/20    Authorization Time Period 30 visit limit - week 10 of 12 (12/6)    OT Start Time 1448    OT Stop Time 1530    OT Time Calculation (min) 42 min    Activity Tolerance Patient tolerated treatment well    Behavior During Therapy [facility] for tasks assessed/performed           Past Medical History:  Diagnosis Date  . Anxiety   . Clonus   . Depression   . Headache   . Pneumonia    "walking"  . Quadriplegia and quadriparesis ([place])   . Spasticity   . Spinal cord tumor   . Wheelchair bound     Past Surgical History:  Procedure Laterality Date  . LAMINECTOMY N/A 06/01/2019   Procedure: Cervical six-seven Laminoplasty, biopsy and resection of intramedullary spinal cord tumor;  Surgeon: [name], MD;  Location: [facility];  Service: Neurosurgery;  Laterality: N/A;  . NO PAST SURGERIES      There were no vitals filed for this visit.   Subjective Assessment - 12/15/19 1511    Subjective  "I ordered the electric chair so I should be getting it as early as Friday."    Pertinent History PMH depression    Limitations Fall Risk.    Patient Stated Goals "I want to have as much functional independence as possible" more dexterity and typing skills for work, wear tights and wear heels    Currently in Pain? Yes    Pain Score 6     Pain Location Neck    Pain Orientation Right    Pain Descriptors / Indicators Aching    Pain Type Acute pain    Pain Onset Yesterday    Pain Frequency Occasional                         OT Treatments/Exercises (OP) - 12/15/19 1455      Shoulder Exercises: Seated   Flexion Strengthening;10 reps    Flexion Weight (lbs) 4lbs   shoulder flexion, chest press, and unilateral shoulder flexion   Other Seated Exercises red theraband exercises - horizontal abduction, tricep extension, bicep curl, shoulder flexion, row x10      Functional Reaching Activities   Mid Level obtained and placed 4 lb dumbbell 2 x RUE and 2 x LUE      Fine Motor Coordination (Hand/Wrist)   Fine Motor Coordination Manipulation of small objects;In hand manipuation training;Small Pegboard    In Hand Manipulation Training --    Small Pegboard with R/LUE and in hand manipulation for placing into board. Obtaining 4 pegs one a a time into palm and translating one at a time to fingertips to place into pegboard. min drops    Manipulation of small objects --                    OT Short Term Goals - 12/11/19 1541      OT SHORT TERM GOAL #1  Title Pt will be independent with HEP for coordination and strengthening 11/06/19    Time 4    Period Weeks    Status Achieved   yellow theraputty 10/8   Target Date 11/06/19      OT SHORT TERM GOAL #2   Title Pt will increase grip strength in BUE by 5 lbs in order to increase ability to manage clothing (tights, socks, pants) and increased independence with ADLs.    Baseline RUE 33.2 lbs LUE 35.4 lbs    Time 4    Period Weeks    Status Achieved   44.3 LUE; 52 RUE     OT SHORT TERM GOAL #3   Title Pt will improve functional use of LUE as evidenced by performing 50 blocks or more on Box & Blocks test.    Baseline LUE 43 blocks    Time 4    Period Weeks    Status Achieved   70     OT SHORT TERM GOAL #4   Title Pt will perform complex meal prep and/or housekeeping tasks with mod I and showing good safety awareness in order to increase independence with IADLs.    Time 4    Period Weeks    Status Not Met   supervision for safety      OT SHORT TERM GOAL #5   Title Pt will verbalize compensatory strategies for visual deficits.    Time 4    Period Weeks    Status Achieved      OT SHORT TERM GOAL #6   Title Pt will improve 9 hole peg test score to 33 seconds or less with BUE in order to increase fine motor coordination in order to increase independence with fasteners and jewelry, etc.    Time 4    Period Weeks    Status Achieved   LUE 27.09 seconds RUE 30.06 seconds     OT SHORT TERM GOAL #7   Title Pt will obtain a lightweight object from higher level cabinet with LUE, dominant hand, for increasing independence with ADLs and IADLs.    Time 4    Period Weeks    Status Achieved      OT SHORT TERM GOAL #8   Title Pt will increase keyboarding/typing skills to completing 30 wpm in order to prep for back to work.    Time 4    Period Weeks    Status Achieved   43 wpm            OT Long Term Goals - 12/04/19 1543      OT LONG TERM GOAL #1   Title Pt will be independent with updated HEP for coordination and strengthening 01/02/20    Time 12    Period Weeks    Status On-going      OT LONG TERM GOAL #2   Title Pt will increase grip strength in BUE by 10 lbs in order to increase ability to manage clothing (tights, socks, pants) and increased independence with ADLs.    Baseline LUE 35.4 RUE 33.2    Time 12    Period Weeks    Status Achieved   RUE 55.9 LUE 45.8     OT LONG TERM GOAL #3   Title Pt will improve functional use of LUE as evidenced by performing 60 blocks or more on Box & Blocks test.    Baseline LUE 43    Time 12    Period Weeks    Status  Achieved   70     OT LONG TERM GOAL #4   Title Pt will improve 9 hole peg test score to 25 seconds or less with BUE in order to increase fine motor coordination in order to increase independence with fasteners and jewelry, etc.    Baseline RUE 37.84 LUE 38.57    Time 12    Period Weeks    Status Achieved   LUE 20.44 RUE 19.34     OT LONG TERM GOAL #5    Title Pt will obtain an object from cabinet greater than or equal to 5lbs with LUE, dominant hand, for increasing independence with ADLs and IADLs.    Time 12    Period Weeks    Status New      OT LONG TERM GOAL #6   Title Pt will increase keyboarding/typing skills to completing 50 wpm in order to prep for back to work.    Time 12    Period Weeks    Status Achieved                  Patient will benefit from skilled therapeutic intervention in order to improve the following deficits and impairments:           Visit Diagnosis: Other lack of coordination  Muscle weakness (generalized)  Visuospatial deficit  Other disturbances of skin sensation  Unsteadiness on feet  Other abnormalities of gait and mobility    Problem List Patient Active Problem List   Diagnosis Date Noted  . Incomplete paraplegia ([place]) 11/08/2019  . Foot drop, bilateral 11/08/2019  . Spasticity 08/18/2019  . Chronic pain syndrome 08/18/2019  . Nerve pain 08/18/2019  . Clonus 07/21/2019  . Spinal cord tumor   . Pain   . Transaminitis   . Leukocytosis   . Neurogenic bowel   . Postoperative pain   . 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 Acute incomplete quadriplegia ([facility])   . 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 Numbness 05/10/2019  . Weakness of left lower extremity 05/10/2019  . 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 Visual disturbance 05/10/2019    Zachery Conch 12/15/2019, 3:25 PM  [facility] [address] [place], [place], [address] Phone: [phone]   Fax:  [phone]  Name: Allie Gerhold MRN: [id number] Date of Birth: 1985/07/21

## 2019-12-20 ENCOUNTER — Other Ambulatory Visit: Payer: Self-pay

## 2019-12-20 ENCOUNTER — Encounter: Payer: Self-pay | Admitting: Physical Therapy

## 2019-12-20 ENCOUNTER — Ambulatory Visit: Payer: BC Managed Care – PPO | Admitting: Physical Therapy

## 2019-12-20 DIAGNOSIS — D497 Neoplasm of unspecified behavior of endocrine glands and other parts of nervous system: Secondary | ICD-10-CM | POA: Diagnosis not present

## 2019-12-20 DIAGNOSIS — R2689 Other abnormalities of gait and mobility: Secondary | ICD-10-CM | POA: Diagnosis not present

## 2019-12-20 DIAGNOSIS — R262 Difficulty in walking, not elsewhere classified: Secondary | ICD-10-CM | POA: Diagnosis not present

## 2019-12-20 DIAGNOSIS — R2681 Unsteadiness on feet: Secondary | ICD-10-CM

## 2019-12-20 DIAGNOSIS — R208 Other disturbances of skin sensation: Secondary | ICD-10-CM | POA: Diagnosis not present

## 2019-12-20 DIAGNOSIS — M6281 Muscle weakness (generalized): Secondary | ICD-10-CM | POA: Diagnosis not present

## 2019-12-20 DIAGNOSIS — R41842 Visuospatial deficit: Secondary | ICD-10-CM | POA: Diagnosis not present

## 2019-12-20 DIAGNOSIS — R278 Other lack of coordination: Secondary | ICD-10-CM | POA: Diagnosis not present

## 2019-12-20 DIAGNOSIS — R293 Abnormal posture: Secondary | ICD-10-CM | POA: Diagnosis not present

## 2019-12-21 ENCOUNTER — Ambulatory Visit (HOSPITAL_COMMUNITY)
Admission: RE | Admit: 2019-12-21 | Discharge: 2019-12-21 | Disposition: A | Discharge status: Home / Self Care | Payer: BC Managed Care – PPO | Source: Ambulatory Visit | Attending: Radiation Oncology | Admitting: Radiation Oncology

## 2019-12-21 DIAGNOSIS — D497 Neoplasm of unspecified behavior of endocrine glands and other parts of nervous system: Secondary | ICD-10-CM

## 2019-12-21 DIAGNOSIS — C720 Malignant neoplasm of spinal cord: Secondary | ICD-10-CM | POA: Diagnosis not present

## 2019-12-21 DIAGNOSIS — Z9889 Other specified postprocedural states: Secondary | ICD-10-CM | POA: Diagnosis not present

## 2019-12-21 DIAGNOSIS — G9589 Other specified diseases of spinal cord: Secondary | ICD-10-CM | POA: Diagnosis not present

## 2019-12-21 MED ORDER — GADOBUTROL 1 MMOL/ML IV SOLN
6.5000 mL | Freq: Once | INTRAVENOUS | Status: AC | PRN
  Administered 2019-12-21: 6.5 mL via INTRAVENOUS

## 2019-12-21 NOTE — Therapy (Signed)
[facility] [address] [place], [place], [address] Phone: [phone]   Fax:  [phone]  Physical Therapy Treatment  Patient Details  Name: Jacob Lambert MRN: [id number] Date of Birth: 1985/04/27 Referring Provider (PT): Dr. Courtney Heys   Encounter Date: 12/20/2019   PT End of Session - 12/20/19 1630    Visit Number 24    Number of Visits 33    Date for PT Re-Evaluation 01/10/20    Authorization Type BCBS    PT Start Time 1531    PT Stop Time 1616    PT Time Calculation (min) 45 min    Equipment Utilized During Treatment Gait belt    Activity Tolerance Patient tolerated treatment well;Patient limited by fatigue    Behavior During Therapy [facility] for tasks assessed/performed           Past Medical History:  Diagnosis Date  . Anxiety   . Clonus   . Depression   . Headache   . Pneumonia    "walking"  . Quadriplegia and quadriparesis ([place])   . Spasticity   . Spinal cord tumor   . Wheelchair bound     Past Surgical History:  Procedure Laterality Date  . LAMINECTOMY N/A 06/01/2019   Procedure: Cervical six-seven Laminoplasty, biopsy and resection of intramedullary spinal cord tumor;  Surgeon: [name], MD;  Location: [facility];  Service: Neurosurgery;  Laterality: N/A;  . NO PAST SURGERIES      There were no vitals filed for this visit.   Subjective Assessment - 12/20/19 1535    Subjective No new complaints. No falls. Got his booster over the weekend and was down for a couple of days. Also tried out some non slip shoes in the shower, which were a "no go". Still slippery.    Limitations Standing;Walking;House hold activities    How long can you sit comfortably? no issues    How long can you stand comfortably? <5 min    How long can you walk comfortably? <0.25 mile    Patient Stated Goals Progress walking    Currently in Pain? Yes    Pain Score 4     Pain Location Generalized   upper  back/neck, central along spine, and legs/feet   Pain Descriptors / Indicators Aching    Pain Type Acute pain;Chronic pain;Neuropathic pain    Pain Onset Other (comment)   varies in onset   Pain Frequency Intermittent    Aggravating Factors  poor positioning, increased activity, brace wear    Pain Relieving Factors pain meds/patch, CBD creame/oral droplets               [facility] Adult PT Treatment/Exercise - 12/20/19 1544      Transfers   Transfers Sit to Stand;Stand to Sit    Sit to Stand 5: Supervision;From chair/3-in-1;Without upper extremity assist;From bed    Stand to Sit 5: Supervision;With upper extremity assist;With armrests;To bed;To chair/3-in-1      Ambulation/Gait   Ambulation/Gait Yes    Ambulation/Gait Assistance 6: Modified independent (Device/Increase time)    Ambulation/Gait Assistance Details around gym with session    Assistive device Rollator    Gait Pattern Step-through pattern;Narrow base of support;Lateral trunk lean to right    Ambulation Surface Level;Indoor      Exercises   Exercises Other Exercises    Other Exercises  concurrent with Bioness and performed on bil LE's: bridges for 10 reps with 5 sec holds; straight leg raises, then  short arc quads for 2 sets of 10 reps on each LE. assist needed for knee stability and controlled movements with these ex's with right>left LE.      Theme park manager bil ant tib and Engineer, building services for increased muscle activation and strengthening    Electrical Stimulation Parameters refer to tablet 1 for adjusted parameters; quick fit electrodes    Electrical Stimulation Goals Strength;Neuromuscular facilitation               PT Short Term Goals - 11/15/19 1759      PT SHORT TERM GOAL #1   Title Patient will be able to ambulate 115' with cane to improve short distance ambulation within home    Baseline 115' with min/mod A with quad cane due to multi LOB  and scissoring 25% (10/13/19); 672' with cane and bilat GRAFOs (11/15/19)    Time 4    Period Weeks    Status Achieved    Target Date 10/18/19      PT SHORT TERM GOAL #2   Title Pt will demo <12 seconds on timed up and go with RW to improve functional mobility (target date 01/10/20)    Baseline 14 seconds RW (eval); 13 seconds rollator (10/13/19); 13.5 seconds SPC + bilat GRAFOs (11/15/19)    Time 8    Period Weeks    Status Revised    Target Date 01/10/20      PT SHORT TERM GOAL #3   Title Patient will be able to go up and down a ramp and curb step to be able to negotiate community    Baseline CGA with rollator for 3' long ramp, forward and over aerobic stepper acting as curb x10 (10/13/19)    Time 4    Period Weeks    Status Achieved    Target Date 10/18/19             PT Long Term Goals - 11/15/19 1800      PT LONG TERM GOAL #1   Title Patient will demo >50/56 on berg balance scale to improve functional balance and reduce fall risk (Target date 01/10/20)    Baseline 40/56 (eval); 47/56 (11/15/19)    Time 8    Period Weeks    Status Revised    Target Date 01/10/20      PT LONG TERM GOAL #2   Title Patient will be able to ambulate >500 feet with cane to improve short distance community ambulation.    Baseline only walks with RW; 672' on 6 MWT with cane + GRAFO (11/15/19)    Time 8    Period Weeks    Status Achieved      PT LONG TERM GOAL #3   Title Patient will demo 4+/5 strength in bil LE grossly to improve functional strength with transfers (Target date 01/10/20)    Baseline 3+-4/5 grossly in bil LE    Time 8    Period Weeks    Status Revised    Target Date 01/10/20      PT LONG TERM GOAL #4   Title Patient will be able to go up and down 12 steps with use of 1-2 rails to be able to go up and down steps at home (Target date 01/10/20)    Baseline 2 steps (with one rail) (Eval); 4 steps, but very unsafe with descent (11/15/19)    Time 8    Period Weeks  Status Revised     Target Date 01/10/20      PT LONG TERM GOAL #5   Title Pt with goal to improve 6 MWT using rollator and GRAFOs to >/= 1000' or using SPC and GRAFOs to >/=800' (Target date 01/10/20)    Baseline 672' with [facility] & GRAFOs (11/15/19)    Time 8    Period Weeks    Status Revised    Target Date 01/10/20                 Plan - 12/20/19 1630    Clinical Impression Statement Today's skilled session continued with use of Bioness to bil LE's  concurrent with ex's for strengthening/muscle re-ed. No issues reported or noted in session. The pt is progressing toward goals and should benefit from continued PT to progress toward unmet goals.    Personal Factors and Comorbidities Age;Past/Current Experience;Comorbidity 2;Time since onset of injury/illness/exacerbation;Transportation    Examination-Activity Limitations Bathing;Lift;Squat;Stairs;Stand;Transfers    Examination-Participation Restrictions Cleaning;Community Activity;Driving;Laundry;Occupation;Shop;Yard Work    Rehab Potential Fair    PT Frequency 2x / week    PT Duration 8 weeks    PT Treatment/Interventions ADLs/Self Care Home Management;Gait training;Stair training;Functional mobility training;Therapeutic activities;Therapeutic exercise;Balance training;Neuromuscular re-education;Manual techniques;Wheelchair mobility training;Patient/family education;Passive range of motion;Energy conservation;Vestibular;Visual/perceptual remediation/compensation;Joint Manipulations;Biofeedback;Electrical Stimulation    PT Next Visit Plan continue with use of Bioness to bil LE"s for strengthening: supine, sitting, standing. progress to use with balance as able.    PT Home Exercise Plan Access Code: [email or web address]    Consulted and Agree with Plan of Care Patient           Patient will benefit from skilled therapeutic intervention in order to improve the following deficits and impairments:  Abnormal gait,Decreased activity tolerance,Decreased  balance,Decreased cognition,Decreased mobility,Decreased endurance,Decreased coordination,Decreased range of motion,Decreased strength,Dizziness,Difficulty walking,Impaired sensation,Impaired tone,Impaired UE functional use,Postural dysfunction,Pain  Visit Diagnosis: Muscle weakness (generalized)  Unsteadiness on feet  Other abnormalities of gait and mobility  Difficulty in walking, not elsewhere classified  Other disturbances of skin sensation     Problem List Patient Active Problem List   Diagnosis Date Noted  . Incomplete paraplegia ([place]) 11/08/2019  . Foot drop, bilateral 11/08/2019  . Spasticity 08/18/2019  . Chronic pain syndrome 08/18/2019  . Nerve pain 08/18/2019  . Clonus 07/21/2019  . Spinal cord tumor   . Pain   . Transaminitis   . Leukocytosis   . Neurogenic bowel   . Postoperative pain   . 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 Acute incomplete quadriplegia ([facility])   . 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 Numbness 05/10/2019  . Weakness of left lower extremity 05/10/2019  . 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 Visual disturbance 05/10/2019    Willow Ora, PTA, [facility] [address], [place], [place] [address] [phone] 12/21/19, 3:22 PM   Name: Edrick Whitehorn MRN: [id number] Date of Birth: Feb 28, 1985

## 2019-12-22 ENCOUNTER — Other Ambulatory Visit: Payer: Self-pay

## 2019-12-22 ENCOUNTER — Ambulatory Visit: Payer: BC Managed Care – PPO

## 2019-12-22 DIAGNOSIS — R2689 Other abnormalities of gait and mobility: Secondary | ICD-10-CM | POA: Diagnosis not present

## 2019-12-22 DIAGNOSIS — R208 Other disturbances of skin sensation: Secondary | ICD-10-CM | POA: Diagnosis not present

## 2019-12-22 DIAGNOSIS — R2681 Unsteadiness on feet: Secondary | ICD-10-CM

## 2019-12-22 DIAGNOSIS — M6281 Muscle weakness (generalized): Secondary | ICD-10-CM

## 2019-12-22 DIAGNOSIS — R278 Other lack of coordination: Secondary | ICD-10-CM | POA: Diagnosis not present

## 2019-12-22 DIAGNOSIS — R41842 Visuospatial deficit: Secondary | ICD-10-CM | POA: Diagnosis not present

## 2019-12-22 DIAGNOSIS — R262 Difficulty in walking, not elsewhere classified: Secondary | ICD-10-CM | POA: Diagnosis not present

## 2019-12-22 DIAGNOSIS — R293 Abnormal posture: Secondary | ICD-10-CM | POA: Diagnosis not present

## 2019-12-22 DIAGNOSIS — D497 Neoplasm of unspecified behavior of endocrine glands and other parts of nervous system: Secondary | ICD-10-CM | POA: Diagnosis not present

## 2019-12-22 NOTE — Therapy (Signed)
[facility] [address] [place], [place], [address] Phone: [phone]   Fax:  [phone]  Physical Therapy Treatment  Patient Details  Name: Jacob Lambert MRN: [id number] Date of Birth: September 23, 1985 Referring Provider (PT): Dr. Courtney Heys   Encounter Date: 12/22/2019   PT End of Session - 12/22/19 1614    Visit Number 25    Number of Visits 33    Date for PT Re-Evaluation 01/10/20    Authorization Type BCBS    PT Start Time 1530    PT Stop Time 1615    PT Time Calculation (min) 45 min    Equipment Utilized During Treatment Gait belt    Activity Tolerance Patient tolerated treatment well;Patient limited by fatigue           Past Medical History:  Diagnosis Date  . Anxiety   . Clonus   . Depression   . Headache   . Pneumonia    "walking"  . Quadriplegia and quadriparesis ([place])   . Spasticity   . Spinal cord tumor   . Wheelchair bound     Past Surgical History:  Procedure Laterality Date  . LAMINECTOMY N/A 06/01/2019   Procedure: Cervical six-seven Laminoplasty, biopsy and resection of intramedullary spinal cord tumor;  Surgeon: [name], MD;  Location: [facility];  Service: Neurosurgery;  Laterality: N/A;  . NO PAST SURGERIES      There were no vitals filed for this visit.   Subjective Assessment - 12/22/19 1534    Subjective Pt reports that electrical stimulation makes him sore for next day. He hasnot noted any short term carry over next day or long term effects yet.    Limitations Standing;Walking;House hold activities    How long can you sit comfortably? no issues    How long can you stand comfortably? <5 min    How long can you walk comfortably? <0.25 mile    Patient Stated Goals Progress walking    Pain Onset Other (comment)   varies in onset                SLR: 2 x 10 R and L, still about 20 deg lag present in  R LE Quad sets with manual resistance behind knee, supine:  10 x R only SAQ: green pool noodle: 3 x 12 R only Static standing with stepping to colored dots: in front and at 45 deg angle (pt performs ipsilateral lateral step and corsso over step for contralateral dot) to challenge limits of stability: 20x R and L Agilitiy ladder: using single cane - reciprocal stepping fwd: 2 laps - side stepping: 1 lap each way - in-in-out-out: 1 lap each way - lateral in-in-out-out: 1 lap fwd, 1 lap bwd Mini jumps on trampoline: bil HHA on counter: 2 x 10                      PT Short Term Goals - 11/15/19 1759      PT SHORT TERM GOAL #1   Title Patient will be able to ambulate 115' with cane to improve short distance ambulation within home    Baseline 115' with min/mod A with quad cane due to multi LOB and scissoring 25% (10/13/19); 672' with cane and bilat GRAFOs (11/15/19)    Time 4    Period Weeks    Status Achieved    Target Date 10/18/19      PT SHORT TERM GOAL #2  Title Pt will demo <12 seconds on timed up and go with RW to improve functional mobility (target date 01/10/20)    Baseline 14 seconds RW (eval); 13 seconds rollator (10/13/19); 13.5 seconds SPC + bilat GRAFOs (11/15/19)    Time 8    Period Weeks    Status Revised    Target Date 01/10/20      PT SHORT TERM GOAL #3   Title Patient will be able to go up and down a ramp and curb step to be able to negotiate community    Baseline CGA with rollator for 3' long ramp, forward and over aerobic stepper acting as curb x10 (10/13/19)    Time 4    Period Weeks    Status Achieved    Target Date 10/18/19             PT Long Term Goals - 11/15/19 1800      PT LONG TERM GOAL #1   Title Patient will demo >50/56 on berg balance scale to improve functional balance and reduce fall risk (Target date 01/10/20)    Baseline 40/56 (eval); 47/56 (11/15/19)    Time 8    Period Weeks    Status Revised    Target Date 01/10/20      PT LONG TERM GOAL #2   Title Patient will be able to  ambulate >500 feet with cane to improve short distance community ambulation.    Baseline only walks with RW; 672' on 6 MWT with cane + GRAFO (11/15/19)    Time 8    Period Weeks    Status Achieved      PT LONG TERM GOAL #3   Title Patient will demo 4+/5 strength in bil LE grossly to improve functional strength with transfers (Target date 01/10/20)    Baseline 3+-4/5 grossly in bil LE    Time 8    Period Weeks    Status Revised    Target Date 01/10/20      PT LONG TERM GOAL #4   Title Patient will be able to go up and down 12 steps with use of 1-2 rails to be able to go up and down steps at home (Target date 01/10/20)    Baseline 2 steps (with one rail) (Eval); 4 steps, but very unsafe with descent (11/15/19)    Time 8    Period Weeks    Status Revised    Target Date 01/10/20      PT LONG TERM GOAL #5   Title Pt with goal to improve 6 MWT using rollator and GRAFOs to >/= 1000' or using SPC and GRAFOs to >/=800' (Target date 01/10/20)    Baseline 672' with [facility] & GRAFOs (11/15/19)    Time 8    Period Weeks    Status Revised    Target Date 01/10/20                 Plan - 12/22/19 1611    Clinical Impression Statement Today's session we progressed to [employer]oymetrics to help recrut more mm fibers and working on dynamic agility exercises to improve reaction time and dynamic balance. Pt reported increased mm soreness after trampoline jumping in R leg. Pt still has 20 deg of quad lag with SLR due to quad weakness. Pt was educated on actively contracting muscles when he performs electricula stimulation next time to improve neuromuscular control and have better carry over into functional mm recruitment.    Personal Factors and Comorbidities Age;Past/Current Experience;Comorbidity  2;Time since onset of injury/illness/exacerbation;Transportation    Examination-Activity Limitations Bathing;Lift;Squat;Stairs;Stand;Transfers    Examination-Participation Restrictions Cleaning;Community  Activity;Driving;Laundry;Occupation;Shop;Yard Work    Rehab Potential Fair    PT Frequency 2x / week    PT Duration 8 weeks    PT Treatment/Interventions ADLs/Self Care Home Management;Gait training;Stair training;Functional mobility training;Therapeutic activities;Therapeutic exercise;Balance training;Neuromuscular re-education;Manual techniques;Wheelchair mobility training;Patient/family education;Passive range of motion;Energy conservation;Vestibular;Visual/perceptual remediation/compensation;Joint Manipulations;Biofeedback;Electrical Stimulation    PT Next Visit Plan continue with use of Bioness to bil LE"s for strengthening: supine, sitting, standing. progress to use with balance as able.    PT Home Exercise Plan Access Code: [email or web address]    Consulted and Agree with Plan of Care Patient           Patient will benefit from skilled therapeutic intervention in order to improve the following deficits and impairments:  Abnormal gait,Decreased activity tolerance,Decreased balance,Decreased cognition,Decreased mobility,Decreased endurance,Decreased coordination,Decreased range of motion,Decreased strength,Dizziness,Difficulty walking,Impaired sensation,Impaired tone,Impaired UE functional use,Postural dysfunction,Pain  Visit Diagnosis: Muscle weakness (generalized)  Unsteadiness on feet  Other abnormalities of gait and mobility  Difficulty in walking, not elsewhere classified     Problem List Patient Active Problem List   Diagnosis Date Noted  . Incomplete paraplegia ([place]) 11/08/2019  . Foot drop, bilateral 11/08/2019  . Spasticity 08/18/2019  . Chronic pain syndrome 08/18/2019  . Nerve pain 08/18/2019  . Clonus 07/21/2019  . Spinal cord tumor   . Pain   . Transaminitis   . Leukocytosis   . Neurogenic bowel   . Postoperative pain   . 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 Acute incomplete quadriplegia ([facility])   . 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 Numbness 05/10/2019   . Weakness of left lower extremity 05/10/2019  . 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 Visual disturbance 05/10/2019    Kerrie Pleasure, PT 12/22/2019, 4:15 PM  [facility] [address] [place], [place], [address] Phone: [phone]   Fax:  [phone]  Name: Jacob Lambert MRN: [id number] Date of Birth: 1985/06/22

## 2019-12-25 ENCOUNTER — Other Ambulatory Visit: Payer: Self-pay

## 2019-12-25 ENCOUNTER — Inpatient Hospital Stay: Payer: BC Managed Care – PPO | Attending: Internal Medicine | Admitting: Internal Medicine

## 2019-12-25 VITALS — BP 126/75 | HR 85 | Temp 97.8°F | Resp 20 | Ht 70.0 in | Wt 146.9 lb

## 2019-12-25 DIAGNOSIS — C719 Malignant neoplasm of brain, unspecified: Secondary | ICD-10-CM | POA: Insufficient documentation

## 2019-12-25 DIAGNOSIS — R29898 Other symptoms and signs involving the musculoskeletal system: Secondary | ICD-10-CM | POA: Diagnosis not present

## 2019-12-25 DIAGNOSIS — Z87891 Personal history of nicotine dependence: Secondary | ICD-10-CM | POA: Diagnosis not present

## 2019-12-25 DIAGNOSIS — C720 Malignant neoplasm of spinal cord: Secondary | ICD-10-CM | POA: Insufficient documentation

## 2019-12-25 NOTE — Progress Notes (Signed)
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 Zada Finders; path demonstrates ependymoma grade II 09/19/19: Completes radiotherapy, 54 Gy in 30 fractions with Dr. Tammi Klippel  Interval History:  Jacob Lambert presents today for follow up having completed radiation therapy, 3 months post-RT MRI scan.  He describes continued gradual improvement with gait, motor, sensory deficits with consistent and directed rehabilitation efforts, ongoing since May 2021.  Currently has bilateral ankle braces, ambulates with a cane or rolling walker depending on the circumstance.  Continues to experience dense sensory loss on 4th and 5th digits on left hand, overall weakness and clumsiness of the left hand limiting its utility.  Has not lost weight but he feels he has lost muscle mass over recent months.  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   H+P (05/25/19) Patient presented to medical attention initially in February 2021 after noticing subtle gait impairment, most affecting him walking up stairs.  Symptoms evolved to include stiffness and weakness of both legs, involuntary tremulous movements of both legs, sensory changes, and sexual dysfunction.  Jacob is also some numbness and weakness impacting his hands, described more as "clumsiness" expressed as difficulty with typing.  He is walking primarily with a cane at this time.  He underwent MRI of cervical spine earlier this week which demonstrated likely mass centered within the cord.  He is currently living with family  locally because of disability, although has longer term residences both in [place].  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     Medications: Current Outpatient Medications on File Prior to Visit  Medication Sig Dispense Refill   acetaminophen (TYLENOL) 325 MG tablet Take 2 tablets (650 mg total) by mouth every 4 (four) hours as needed for mild pain ((score 1 to 3) or temp > 100.5).     baclofen (LIORESAL) 10 MG tablet Take by mouth.     Baclofen 5 MG TABS Take 15 mg by mouth 4 (four) times daily. 180 tablet 5   bisacodyl (DULCOLAX) 10 MG suppository Place 1 suppository (10 mg total) rectally at bedtime. 12 suppository 0   citalopram (CELEXA) 40 MG tablet Take 1 tablet (40 mg total) by mouth daily. 30 tablet 5   dantrolene (DANTRIUM) 100 MG capsule Take 1 capsule (100 mg total) by mouth 2 (two) times daily. 60 capsule 5   dantrolene (DANTRIUM) 100 MG capsule Take 1 capsule (100 mg total) by mouth 3 (three) times daily. 90 capsule 5   diazepam (VALIUM) 10 MG tablet Take 1 tablet (10 mg total) by mouth every 6 (six) hours as needed. 30 tablet 0   docusate sodium (COLACE) 100 MG capsule Take 1 capsule (100 mg total) by mouth 3 (three) times daily. 10 capsule 0   gabapentin (NEURONTIN) 300 MG capsule Take 2 capsules (600 mg total) by mouth 3 (three) times daily. 180 capsule 1   gabapentin (NEURONTIN) 600 MG tablet Take 1 tablet (600 mg total) by mouth 3 (three) times daily. 180 tablet 5  lidocaine (LIDODERM) 5 % Place 1 patch onto the skin daily. Remove & Discard patch within 12 hours or as directed by MD (Patient taking differently: Place 1 patch onto the skin daily. Remove & Discard patch within 12 hours or as directed by MD states he is using 4% patch) 30 patch 0   Multiple Vitamin (MULTIVITAMIN WITH MINERALS) TABS tablet Take 1 tablet by mouth daily.      polyethylene glycol (MIRALAX / GLYCOLAX) 17 g packet  Take 17 g by mouth 3 (three) times daily. 14 each 0   senna-docusate (SENOKOT-S) 8.6-50 MG tablet Take 2 tablets by mouth at bedtime.     sildenafil (VIAGRA) 100 MG tablet Take 1 tablet (100 mg total) by mouth daily as needed for erectile dysfunction. 10 tablet 5   sildenafil (VIAGRA) 50 MG tablet Take by mouth.     No current facility-administered medications on file prior to visit.    Allergies:  Allergies  Allergen Reactions   Cephalosporins Hives   Other     Patagonion toothfish   Past Medical History:  Past Medical History:  Diagnosis Date   Anxiety    Clonus    Depression    Headache    Pneumonia    "walking"   Quadriplegia and quadriparesis ([place])    Spasticity    Spinal cord tumor    Wheelchair bound    Past Surgical History:  Past Surgical History:  Procedure Laterality Date   LAMINECTOMY N/A 06/01/2019   Procedure: Cervical six-seven Laminoplasty, biopsy and resection of intramedullary spinal cord tumor;  Surgeon: [name], MD;  Location: [facility];  Service: Neurosurgery;  Laterality: N/A;   NO PAST SURGERIES     Social History:  Social History   Socioeconomic History   Marital status: Single    Spouse name: Not on file   Number of children: Not on file   Years of education: Not on file   Highest education level: Not on file  Occupational History   Not on file  Tobacco Use   Smoking status: Former Smoker   Smokeless tobacco: Never Used   Tobacco comment: none in years   Vaping Use   Vaping Use: Never used  Substance and Sexual Activity   Alcohol use: Not Currently    Comment: 2 bottles of wine per week, Maybe 4 - 8 shots aweek   Drug use: Not Currently    Comment: CDB   Sexual activity: Not Currently  Other Topics Concern   Not on file  Social History Narrative   Lives with parents   Caffeine use: minimum- 3 cups per day (on average 6-10 cups per day)   Left handed    Social Determinants of Health    Financial Resource Strain: Not on file  Food Insecurity: Not on file  Transportation Needs: Not on file  Physical Activity: Not on file  Stress: Not on file  Social Connections: Not on file  Intimate Partner Violence: Not on file   Family History: No family history on file.  Review of Systems: Constitutional: Doesn't report fevers, chills or abnormal weight loss Eyes: Doesn't report blurriness of vision Ears, nose, mouth, throat, and face: Doesn't report sore throat Respiratory: Doesn't report cough, dyspnea or wheezes Cardiovascular: Doesn't report palpitation, chest discomfort  Gastrointestinal:  Doesn't report nausea, constipation, diarrhea GU: Doesn't report incontinence Skin: Doesn't report skin rashes Neurological: Per HPI Musculoskeletal: Doesn't report joint pain Behavioral/Psych: Doesn't report anxiety  Physical Exam: Vitals:   12/25/19  1218  BP: 126/75  Pulse: 85  Resp: 20  Temp: 97.8 F (36.6 C)  SpO2: 100%   KPS: 60. General: Alert, cooperative, pleasant, in no acute distress Head: Normal EENT: No conjunctival injection or scleral icterus.  Lungs: Resp effort normal Cardiac: Regular rate Abdomen: Non-distended abdomen Skin: No rashes cyanosis or petechiae. Extremities: No clubbing or edema  Neurologic Exam: Mental Status: Awake, alert, attentive to examiner. Oriented to self and environment. Language is fluent with intact comprehension.  Cranial Nerves: Visual acuity is impaired in left eye. Visual fields are full. Extra-ocular movements intact. No ptosis. Face is symmetric Motor: Tone and bulk are normal. Power is 4/5 in both legs, 4+/5 in arms, left hand has impaired coordination and fine motor function. Reflexes are broadly increased with spontaneous clonus Sensory: Stocking impairment, left arm impaired in C8/T1 distribution Gait: Spastic, assisted with cane  Labs: I have reviewed the data as listed    Component Value Date/Time   NA 139  12/06/2019 1323   K 4.8 12/06/2019 1323   CL 99 12/06/2019 1323   CO2 27 12/06/2019 1323   GLUCOSE 130 (H) 12/06/2019 1323   GLUCOSE 93 06/27/2019 0726   BUN 13 12/06/2019 1323   CREATININE 0.97 12/06/2019 1323   CALCIUM 9.8 12/06/2019 1323   PROT 7.6 12/06/2019 1323   ALBUMIN 4.8 12/06/2019 1323   AST 18 12/06/2019 1323   ALT 22 12/06/2019 1323   ALKPHOS 45 12/06/2019 1323   BILITOT 0.4 12/06/2019 1323   GFRNONAA 101 12/06/2019 1323   GFRAA 117 12/06/2019 1323   Lab Results  Component Value Date   WBC 8.4 06/27/2019   NEUTROABS 4.7 06/27/2019   HGB 13.2 06/27/2019   HCT 41.8 06/27/2019   MCV 87.1 06/27/2019   PLT 284 06/27/2019    Imaging:  [facility] Clinician Interpretation: I have personally reviewed the CNS images as listed.  My interpretation, in the context of the patient's clinical presentation, is stable disease  MR CERVICAL SPINE W WO CONTRAST  Result Date: 12/21/2019 CLINICAL DATA:  Spinal cord neoplasm status post radiation treatment. EXAM: MRI CERVICAL SPINE WITHOUT AND WITH CONTRAST TECHNIQUE: Multiplanar and multiecho pulse sequences of the cervical spine, to include the craniocervical junction and cervicothoracic junction, were obtained without and with intravenous contrast. CONTRAST:  6.36mL GADAVIST GADOBUTROL 1 MMOL/ML IV SOLN COMPARISON:  06/01/2019 FINDINGS: Alignment: Physiologic. Vertebrae: Vertebral body bone marrow signal is normal. Postsurgical changes at the C6 and C7 levels. Cord: Areas of cystic change and abnormal T2-weighted signal intensity within the cervical spinal cord beginning at the C5-6 level have decreased. Jacob is still multifocal cystic change but the diameter of spaces has generally decreased. No abnormal contrast enhancement. Posterior Fossa, vertebral arteries, paraspinal tissues: Postsurgical changes in the dorsal soft tissues. Enlarged retrocerebellar CSF space, unchanged. Disc levels: No disc herniation, spinal canal stenosis or neural  foraminal stenosis. IMPRESSION: 1. Decreased abnormal T2-weighted signal intensity within the cervical spinal cord beginning at the C5-6 level. Decreased diameter of residual cystic spaces of myelomalacia. 2. No abnormal contrast enhancement. Electronically Signed   By: Ulyses Jarred M.D.   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 MRI demonstrates very good response, no enhancing residual disease, and no cord signal abnormality aside from resection cavity.   We recommended continued aggressive rehabilitation efforts, and greatly appreciate the contributions from Dr. Florentina Jenny team here.  Case and imaging were reviewed in brain/spine tumor board meeting on 12/25/19.   We ask that Yonathan Perrow return to clinic in 3 months following next brain MRI, or sooner as needed.  All questions were answered. The patient knows to call the clinic with any problems, questions or concerns. No barriers to learning were detected.  The total time spent in the encounter was 40 minutes and more than 50% was on counseling and review of test results   Ventura Sellers, MD Medical Director of Neuro-Oncology [facility] [place] 12/25/19 12:08 PM

## 2019-12-26 ENCOUNTER — Ambulatory Visit: Payer: BC Managed Care – PPO | Admitting: Occupational Therapy

## 2019-12-26 ENCOUNTER — Encounter: Payer: Self-pay | Admitting: Occupational Therapy

## 2019-12-26 DIAGNOSIS — R41842 Visuospatial deficit: Secondary | ICD-10-CM | POA: Diagnosis not present

## 2019-12-26 DIAGNOSIS — R262 Difficulty in walking, not elsewhere classified: Secondary | ICD-10-CM | POA: Diagnosis not present

## 2019-12-26 DIAGNOSIS — R278 Other lack of coordination: Secondary | ICD-10-CM | POA: Diagnosis not present

## 2019-12-26 DIAGNOSIS — R2689 Other abnormalities of gait and mobility: Secondary | ICD-10-CM

## 2019-12-26 DIAGNOSIS — R208 Other disturbances of skin sensation: Secondary | ICD-10-CM | POA: Diagnosis not present

## 2019-12-26 DIAGNOSIS — M6281 Muscle weakness (generalized): Secondary | ICD-10-CM | POA: Diagnosis not present

## 2019-12-26 DIAGNOSIS — R2681 Unsteadiness on feet: Secondary | ICD-10-CM | POA: Diagnosis not present

## 2019-12-26 DIAGNOSIS — R293 Abnormal posture: Secondary | ICD-10-CM | POA: Diagnosis not present

## 2019-12-26 DIAGNOSIS — D497 Neoplasm of unspecified behavior of endocrine glands and other parts of nervous system: Secondary | ICD-10-CM | POA: Diagnosis not present

## 2019-12-26 NOTE — Patient Instructions (Signed)
Diplopia HEP:  Perform at least 3 times per day. Stop if your eye becomes fatigued or hurts and try again later.  1. Hold a small object/card in front of you.  Hold it in the middle at arm's length away.    2. Cover your LEFT eye and look at the object with your RIGHT eye.  3. Slowly move the object side to side in front of you while continuing to watch it with your RIGHT eye.  4.  Remember to keep your head still and only move your eye.  5.  Repeat 5-10 times.  6.  Then, move object up and down while watching it 5-10 times.  7. Cover your RIGHT eye and look at the object with your LEFT eye while you repeat #1-6 above.  8.  Now, uncover both eyes and try to focus on the object while holding it in the middle.  Try to make it 1 image.   9.  If you can, try to hold it for 10-30 sec increasing as able.    10.  Once you can make the image 1 for at least 30 sec in the middle, repeat #1-6 above with both eyes moving slowly and only in the range that you can keep the image 1.  

## 2019-12-26 NOTE — Therapy (Signed)
[facility] [address] [place], [place], [address] Phone: [phone]   Fax:  [phone]  Occupational Therapy Treatment  Patient Details  Name: Jacob Lambert MRN: [id number] Date of Birth: [birth year] Referring Provider (OT): Dr. Courtney Heys   Encounter Date: 12/26/2019   OT End of Session - 12/26/19 1615    Visit Number 20    Number of Visits 25    Date for OT Re-Evaluation 01/01/20    Authorization Time Period 30 visit limit - week 11 of 12 (12/21)    OT Start Time 1615    OT Stop Time 1657    OT Time Calculation (min) 42 min    Activity Tolerance Patient tolerated treatment well    Behavior During Therapy [facility] for tasks assessed/performed           Past Medical History:  Diagnosis Date  . Anxiety   . Clonus   . Depression   . Headache   . Pneumonia    "walking"  . Quadriplegia and quadriparesis ([place])   . Spasticity   . Spinal cord tumor   . Wheelchair bound     Past Surgical History:  Procedure Laterality Date  . LAMINECTOMY N/A 06/01/2019   Procedure: Cervical six-seven Laminoplasty, biopsy and resection of intramedullary spinal cord tumor;  Surgeon: [name], MD;  Location: [facility];  Service: Neurosurgery;  Laterality: N/A;  . NO PAST SURGERIES      There were no vitals filed for this visit.   Subjective Assessment - 12/26/19 1616    Subjective  "Met with Neuro Oncologist and radiation seems to have worked." My vision is getting worse.    Pertinent History PMH depression    Limitations Fall Risk.    Patient Stated Goals "I want to have as much functional independence as possible" more dexterity and typing skills for work, wear tights and wear heels    Currently in Pain? Yes    Pain Score 4    "regular pain"   Pain Location Generalized   neck and inferiorly   Pain Orientation Other (Comment)    Pain Descriptors / Indicators Aching    Pain Type Acute pain;Chronic  pain;Neuropathic pain    Pain Onset Other (comment)   varies in onset   Pain Frequency Intermittent    Pain Onset --                        OT Treatments/Exercises (OP) - 12/26/19 1638      ADLs   Functional Mobility functional mobility while amublating with cane in RUE and carrying cup of water in LUE with no drops and no LOB but with stand by assistance for safety      Hand Exercises   Other Hand Exercises Level 2 on hand gripper with LUE and RUE to pick up 1 inch blocks    Other Hand Exercises LUE 55.3 lbs RUE 67                  OT Education - 12/26/19 1635    Education Details issued and demonstrated Dipolopia HEP  - see pt instructions    Person(s) Educated Patient    Methods Demonstration;Explanation    Comprehension Verbalized understanding;Returned demonstration            OT Short Term Goals - 12/11/19 1541      OT SHORT TERM GOAL #1   Title Pt will be independent with  HEP for coordination and strengthening 11/06/19    Time 4    Period Weeks    Status Achieved   yellow theraputty 10/8   Target Date 11/06/19      OT SHORT TERM GOAL #2   Title Pt will increase grip strength in BUE by 5 lbs in order to increase ability to manage clothing (tights, socks, pants) and increased independence with ADLs.    Baseline RUE 33.2 lbs LUE 35.4 lbs    Time 4    Period Weeks    Status Achieved   44.3 LUE; 52 RUE     OT SHORT TERM GOAL #3   Title Pt will improve functional use of LUE as evidenced by performing 50 blocks or more on Box & Blocks test.    Baseline LUE 43 blocks    Time 4    Period Weeks    Status Achieved   70     OT SHORT TERM GOAL #4   Title Pt will perform complex meal prep and/or housekeeping tasks with mod I and showing good safety awareness in order to increase independence with IADLs.    Time 4    Period Weeks    Status Not Met   supervision for safety     OT SHORT TERM GOAL #5   Title Pt will verbalize compensatory strategies  for visual deficits.    Time 4    Period Weeks    Status Achieved      OT SHORT TERM GOAL #6   Title Pt will improve 9 hole peg test score to 33 seconds or less with BUE in order to increase fine motor coordination in order to increase independence with fasteners and jewelry, etc.    Time 4    Period Weeks    Status Achieved   LUE 27.09 seconds RUE 30.06 seconds     OT SHORT TERM GOAL #7   Title Pt will obtain a lightweight object from higher level cabinet with LUE, dominant hand, for increasing independence with ADLs and IADLs.    Time 4    Period Weeks    Status Achieved      OT SHORT TERM GOAL #8   Title Pt will increase keyboarding/typing skills to completing 30 wpm in order to prep for back to work.    Time 4    Period Weeks    Status Achieved   43 wpm            OT Long Term Goals - 12/04/19 1543      OT LONG TERM GOAL #1   Title Pt will be independent with updated HEP for coordination and strengthening 01/02/20    Time 12    Period Weeks    Status On-going      OT LONG TERM GOAL #2   Title Pt will increase grip strength in BUE by 10 lbs in order to increase ability to manage clothing (tights, socks, pants) and increased independence with ADLs.    Baseline LUE 35.4 RUE 33.2    Time 12    Period Weeks    Status Achieved   RUE 55.9 LUE 45.8     OT LONG TERM GOAL #3   Title Pt will improve functional use of LUE as evidenced by performing 60 blocks or more on Box & Blocks test.    Baseline LUE 43    Time 12    Period Weeks    Status Achieved   70  OT LONG TERM GOAL #4   Title Pt will improve 9 hole peg test score to 25 seconds or less with BUE in order to increase fine motor coordination in order to increase independence with fasteners and jewelry, etc.    Baseline RUE 37.84 LUE 38.57    Time 12    Period Weeks    Status Achieved   LUE 20.44 RUE 19.34     OT LONG TERM GOAL #5   Title Pt will obtain an object from cabinet greater than or equal to 5lbs  with LUE, dominant hand, for increasing independence with ADLs and IADLs.    Time 12    Period Weeks    Status New      OT LONG TERM GOAL #6   Title Pt will increase keyboarding/typing skills to completing 50 wpm in order to prep for back to work.    Time 12    Period Weeks    Status Achieved                 Plan - 12/26/19 1624    Clinical Impression Statement Pt continues to make progress.    OT Occupational Profile and History Detailed Assessment- Review of Records and additional review of physical, cognitive, psychosocial history related to current functional performance    Occupational performance deficits (Please refer to evaluation for details): IADL's;ADL's;Work;Social Participation;Leisure    Body Structure / Function / Physical Skills Strength;Gait;Decreased knowledge of use of DME;ADL;Balance;Dexterity;GMC;Pain;Tone;Body mechanics;UE functional use;Endurance;IADL;ROM;Vision;Coordination;Flexibility;Mobility;Sensation;FMC    Rehab Potential Good    Clinical Decision Making Limited treatment options, no task modification necessary    Comorbidities Affecting Occupational Performance: May have comorbidities impacting occupational performance    Modification or Assistance to Complete Evaluation  No modification of tasks or assist necessary to complete eval    OT Frequency 2x / week    OT Duration 12 weeks    OT Treatment/Interventions Self-care/ADL training;Moist Heat;Fluidtherapy;DME and/or AE instruction;Balance training;Therapeutic activities;Gait Training;Ultrasound;Therapeutic exercise;Scar mobilization;Visual/perceptual remediation/compensation;Passive range of motion;Functional Mobility Training;Neuromuscular education;Electrical Stimulation;Paraffin;Energy conservation;Manual Therapy;Patient/family education    Plan strengthening, arm bike, endurance - plan to dc at end of planning sessions/december    Consulted and Agree with Plan of Care Patient            Patient will benefit from skilled therapeutic intervention in order to improve the following deficits and impairments:   Body Structure / Function / Physical Skills: Strength,Gait,Decreased knowledge of use of DME,ADL,Balance,Dexterity,GMC,Pain,Tone,Body mechanics,UE functional use,Endurance,IADL,ROM,Vision,Coordination,Flexibility,Mobility,Sensation,FMC       Visit Diagnosis: Muscle weakness (generalized)  Unsteadiness on feet  Other abnormalities of gait and mobility  Other lack of coordination  Visuospatial deficit    Problem List Patient Active Problem List   Diagnosis Date Noted  . Incomplete paraplegia (HCC) 11/08/2019  . Foot drop, bilateral 11/08/2019  . Spasticity 08/18/2019  . Chronic pain syndrome 08/18/2019  . Nerve pain 08/18/2019  . Clonus 07/21/2019  . Spinal cord tumor   . Pain   . Transaminitis   . Leukocytosis   . Neurogenic bowel   . Postoperative pain   . Ependymoma (HCC) 06/08/2019  . Acute incomplete quadriplegia (HCC)   . Spinal cord ependymoma (HCC) 05/23/2019  . Numbness 05/10/2019  . Weakness of left lower extremity 05/10/2019  . 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 Visual disturbance 05/10/2019    Junious Dresser MOT, OTR/L  12/26/2019, 5:01 PM  [facility] [address] [place], [place], [address] Phone: [phone]   Fax:  [phone]  Name: Jacob Lambert MRN: [id number] Date of Birth: 05/08/1985

## 2019-12-28 ENCOUNTER — Ambulatory Visit: Payer: BC Managed Care – PPO | Admitting: Physical Therapy

## 2019-12-28 ENCOUNTER — Encounter: Payer: Self-pay | Admitting: Physical Therapy

## 2019-12-28 ENCOUNTER — Encounter: Payer: Self-pay | Admitting: Occupational Therapy

## 2019-12-28 ENCOUNTER — Ambulatory Visit: Payer: BC Managed Care – PPO | Admitting: Occupational Therapy

## 2019-12-28 ENCOUNTER — Other Ambulatory Visit: Payer: Self-pay

## 2019-12-28 DIAGNOSIS — M6281 Muscle weakness (generalized): Secondary | ICD-10-CM

## 2019-12-28 DIAGNOSIS — R2689 Other abnormalities of gait and mobility: Secondary | ICD-10-CM | POA: Diagnosis not present

## 2019-12-28 DIAGNOSIS — G8250 Quadriplegia, unspecified: Secondary | ICD-10-CM | POA: Diagnosis not present

## 2019-12-28 DIAGNOSIS — R41842 Visuospatial deficit: Secondary | ICD-10-CM

## 2019-12-28 DIAGNOSIS — R208 Other disturbances of skin sensation: Secondary | ICD-10-CM

## 2019-12-28 DIAGNOSIS — R2681 Unsteadiness on feet: Secondary | ICD-10-CM

## 2019-12-28 DIAGNOSIS — R278 Other lack of coordination: Secondary | ICD-10-CM

## 2019-12-28 DIAGNOSIS — R293 Abnormal posture: Secondary | ICD-10-CM | POA: Diagnosis not present

## 2019-12-28 DIAGNOSIS — D497 Neoplasm of unspecified behavior of endocrine glands and other parts of nervous system: Secondary | ICD-10-CM | POA: Diagnosis not present

## 2019-12-28 DIAGNOSIS — R262 Difficulty in walking, not elsewhere classified: Secondary | ICD-10-CM | POA: Diagnosis not present

## 2019-12-28 NOTE — Therapy (Signed)
[facility] [address] [place], [place], [address] Phone: [phone]   Fax:  [phone]  Physical Therapy Treatment  Patient Details  Name: Jacob Lambert MRN: [id number] Date of Birth: 01/11/1985 Referring Provider (PT): Dr. Courtney Heys   Encounter Date: 12/28/2019   PT End of Session - 12/28/19 1535    Visit Number 26    Number of Visits 33    Date for PT Re-Evaluation 01/10/20    Authorization Type [employer]    PT Start Time 1532    PT Stop Time 1616    PT Time Calculation (min) 44 min    Equipment Utilized During Treatment Gait belt    Activity Tolerance Patient tolerated treatment well;No increased pain    Behavior During Therapy WFL for tasks assessed/performed           Past Medical History:  Diagnosis Date  . Anxiety   . Clonus   . Depression   . Headache   . Pneumonia    "walking"  . Quadriplegia and quadriparesis ([place])   . Spasticity   . Spinal cord tumor   . Wheelchair bound     Past Surgical History:  Procedure Laterality Date  . LAMINECTOMY N/A 06/01/2019   Procedure: Cervical six-seven Laminoplasty, biopsy and resection of intramedullary spinal cord tumor;  Surgeon: [name], MD;  Location: [facility];  Service: Neurosurgery;  Laterality: N/A;  . NO PAST SURGERIES      There were no vitals filed for this visit.   Subjective Assessment - 12/28/19 1535    Subjective No new complaitns. No falls or pain to report.    Limitations Standing;Walking;House hold activities    How long can you sit comfortably? no issues    How long can you stand comfortably? <5 min                   [facility] Adult PT Treatment/Exercise - 12/28/19 1536      Transfers   Transfers Sit to Stand;Stand to Sit    Sit to Stand 5: Supervision;From chair/3-in-1;Without upper extremity assist;From bed    Stand to Sit 5: Supervision;With upper extremity assist;With armrests;To bed;To chair/3-in-1       Ambulation/Gait   Ambulation/Gait Yes    Ambulation/Gait Assistance 6: Modified independent (Device/Increase time)    Ambulation/Gait Assistance Details around gym with session    Assistive device Rollator    Gait Pattern Step-through pattern;Narrow base of support;Lateral trunk lean to right    Ambulation Surface Level;Indoor      Exercises   Exercises Other Exercises    Other Exercises  concurrent with Bioness: in supine on mat table- straight leg raises, then short arc quads for 10 reps each with assist for eccentric lowering, hold time of 5 seconds with rest timed of 10 seconds. then seated at edge of mat table- long arc quads x 10 reps each side with 5 sec holds, rest between each rep with cues for controlled essentric lowering. then had pt work on reverse squats by "hovering" above mat for 5 sec holds without UE support for 10 reps with rest breaks taken. min guard assist for safety. intensity of Bioness adjusted as needed with ex's (both up or down) based on pt feedback.      Theme park manager bil ant tib and Engineer, building services for increased strengthening and muscle activation    Electrical Stimulation Parameters refer to Tablet  1 for adjusted parameters' quick fit electrodes    Electrical Stimulation Goals Strength;Neuromuscular facilitation                 PT Short Term Goals - 11/15/19 1759      PT SHORT TERM GOAL #1   Title Patient will be able to ambulate 115' with cane to improve short distance ambulation within home    Baseline 115' with min/mod A with quad cane due to multi LOB and scissoring 25% (10/13/19); 672' with cane and bilat GRAFOs (11/15/19)    Time 4    Period Weeks    Status Achieved    Target Date 10/18/19      PT SHORT TERM GOAL #2   Title Pt will demo <12 seconds on timed up and go with RW to improve functional mobility (target date 01/10/20)    Baseline 14 seconds RW (eval); 13 seconds  rollator (10/13/19); 13.5 seconds SPC + bilat GRAFOs (11/15/19)    Time 8    Period Weeks    Status Revised    Target Date 01/10/20      PT SHORT TERM GOAL #3   Title Patient will be able to go up and down a ramp and curb step to be able to negotiate community    Baseline CGA with rollator for 3' long ramp, forward and over aerobic stepper acting as curb x10 (10/13/19)    Time 4    Period Weeks    Status Achieved    Target Date 10/18/19             PT Long Term Goals - 11/15/19 1800      PT LONG TERM GOAL #1   Title Patient will demo >50/56 on berg balance scale to improve functional balance and reduce fall risk (Target date 01/10/20)    Baseline 40/56 (eval); 47/56 (11/15/19)    Time 8    Period Weeks    Status Revised    Target Date 01/10/20      PT LONG TERM GOAL #2   Title Patient will be able to ambulate >500 feet with cane to improve short distance community ambulation.    Baseline only walks with RW; 672' on 6 MWT with cane + GRAFO (11/15/19)    Time 8    Period Weeks    Status Achieved      PT LONG TERM GOAL #3   Title Patient will demo 4+/5 strength in bil LE grossly to improve functional strength with transfers (Target date 01/10/20)    Baseline 3+-4/5 grossly in bil LE    Time 8    Period Weeks    Status Revised    Target Date 01/10/20      PT LONG TERM GOAL #4   Title Patient will be able to go up and down 12 steps with use of 1-2 rails to be able to go up and down steps at home (Target date 01/10/20)    Baseline 2 steps (with one rail) (Eval); 4 steps, but very unsafe with descent (11/15/19)    Time 8    Period Weeks    Status Revised    Target Date 01/10/20      PT LONG TERM GOAL #5   Title Pt with goal to improve 6 MWT using rollator and GRAFOs to >/= 1000' or using SPC and GRAFOs to >/=800' (Target date 01/10/20)    Baseline 672' with [facility] & GRAFOs (11/15/19)    Time 8  Period Weeks    Status Revised    Target Date 01/10/20                  Plan - 12/28/19 1535    Clinical Impression Statement Today's skilled session continued to focus on use of Bioness with ex's for strengtheing with rest breaks taken as needed. Pt continues to have good muscle activation with Bioness with visable muscle tremors when fatigued. The pt is progressing toward goals and should benefit from continued PT to progress toward unmet goals    Personal Factors and Comorbidities Age;Past/Current Experience;Comorbidity 2;Time since onset of injury/illness/exacerbation;Transportation    Examination-Activity Limitations Bathing;Lift;Squat;Stairs;Stand;Transfers    Examination-Participation Restrictions Cleaning;Community Activity;Driving;Laundry;Occupation;Shop;Yard Work    Rehab Potential Fair    PT Frequency 2x / week    PT Duration 8 weeks    PT Treatment/Interventions ADLs/Self Care Home Management;Gait training;Stair training;Functional mobility training;Therapeutic activities;Therapeutic exercise;Balance training;Neuromuscular re-education;Manual techniques;Wheelchair mobility training;Patient/family education;Passive range of motion;Energy conservation;Vestibular;Visual/perceptual remediation/compensation;Joint Manipulations;Biofeedback;Electrical Stimulation    PT Next Visit Plan PT see's next session- assess goals for recert; continue with use of Bioness to bil LE"s for strengthening: supine, sitting, standing. progress to use with balance as able.    PT Home Exercise Plan Access Code: [email or web address]; [email or web address]    Consulted and Agree with Plan of Care Patient           Patient will benefit from skilled therapeutic intervention in order to improve the following deficits and impairments:  Abnormal gait,Decreased activity tolerance,Decreased balance,Decreased cognition,Decreased mobility,Decreased endurance,Decreased coordination,Decreased range of motion,Decreased strength,Dizziness,Difficulty walking,Impaired sensation,Impaired tone,Impaired UE functional  use,Postural dysfunction,Pain  Visit Diagnosis: Muscle weakness (generalized)  Unsteadiness on feet  Other abnormalities of gait and mobility     Problem List Patient Active Problem List   Diagnosis Date Noted  . Incomplete paraplegia ([place]) 11/08/2019  . Foot drop, bilateral 11/08/2019  . Spasticity 08/18/2019  . Chronic pain syndrome 08/18/2019  . Nerve pain 08/18/2019  . Clonus 07/21/2019  . Spinal cord tumor   . Pain   . Transaminitis   . Leukocytosis   . Neurogenic bowel   . Postoperative pain   . 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 Acute incomplete quadriplegia ([facility])   . 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 Numbness 05/10/2019  . Weakness of left lower extremity 05/10/2019  . 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 Visual disturbance 05/10/2019    Willow Ora, PTA, [facility] Outpatient Neuro [facility] [address] [place], [place] [address] [phone] 12/28/19, 4:34 PM   Name: Jacob Lambert MRN: [id number] Date of Birth: Dec 14, 1985

## 2019-12-28 NOTE — Therapy (Signed)
[facility] [address] [place], [place], [address] Phone: [phone]   Fax:  [phone]  Occupational Therapy Treatment  Patient Details  Name: Jacob Lambert MRN: [id number] Date of Birth: September 26, 1985 Referring Provider (OT): Dr. Courtney Heys   Encounter Date: 12/28/2019   OT End of Session - 12/28/19 1451    Visit Number 21    Number of Visits 25    Date for OT Re-Evaluation 01/01/20    Authorization Time Period 30 visit limit - week 11 of 12 (12/21)    OT Start Time 1450    OT Stop Time 1530    OT Time Calculation (min) 40 min    Activity Tolerance Patient tolerated treatment well    Behavior During Therapy [facility] for tasks assessed/performed           Past Medical History:  Diagnosis Date  . Anxiety   . Clonus   . Depression   . Headache   . Pneumonia    "walking"  . Quadriplegia and quadriparesis ([place])   . Spasticity   . Spinal cord tumor   . Wheelchair bound     Past Surgical History:  Procedure Laterality Date  . LAMINECTOMY N/A 06/01/2019   Procedure: Cervical six-seven Laminoplasty, biopsy and resection of intramedullary spinal cord tumor;  Surgeon: [name], MD;  Location: [facility];  Service: Neurosurgery;  Laterality: N/A;  . NO PAST SURGERIES      There were no vitals filed for this visit.   Subjective Assessment - 12/28/19 1451    Subjective  "The regular pain" Pt denies any changes    Pertinent History PMH depression    Limitations Fall Risk.    Patient Stated Goals "I want to have as much functional independence as possible" more dexterity and typing skills for work, wear tights and wear heels    Currently in Pain? Yes    Pain Score 4     Pain Location Generalized    Pain Descriptors / Indicators Aching    Pain Type Acute pain;Chronic pain;Neuropathic pain    Pain Onset Other (comment)   varies in onset   Pain Frequency Constant                         OT Treatments/Exercises (OP) - 12/28/19 1453      Shoulder Exercises: Seated   Other Seated Exercises 3 lb dumbbell - 10 x bicep curls, hammer curls, chest press, shoulder press, supination/pronation, circumduction, front raise, lateral raise   x2 sets     Functional Reaching Activities   High Level resistance clothespins 1-8# BUE on top of mirror and elevated antenna                    OT Short Term Goals - 12/11/19 1541      OT SHORT TERM GOAL #1   Title Pt will be independent with HEP for coordination and strengthening 11/06/19    Time 4    Period Weeks    Status Achieved   yellow theraputty 10/8   Target Date 11/06/19      OT SHORT TERM GOAL #2   Title Pt will increase grip strength in BUE by 5 lbs in order to increase ability to manage clothing (tights, socks, pants) and increased independence with ADLs.    Baseline RUE 33.2 lbs LUE 35.4 lbs    Time 4    Period Weeks  Status Achieved   44.3 LUE; 52 RUE     OT SHORT TERM GOAL #3   Title Pt will improve functional use of LUE as evidenced by performing 50 blocks or more on Box & Blocks test.    Baseline LUE 43 blocks    Time 4    Period Weeks    Status Achieved   70     OT SHORT TERM GOAL #4   Title Pt will perform complex meal prep and/or housekeeping tasks with mod I and showing good safety awareness in order to increase independence with IADLs.    Time 4    Period Weeks    Status Not Met   supervision for safety     OT SHORT TERM GOAL #5   Title Pt will verbalize compensatory strategies for visual deficits.    Time 4    Period Weeks    Status Achieved      OT SHORT TERM GOAL #6   Title Pt will improve 9 hole peg test score to 33 seconds or less with BUE in order to increase fine motor coordination in order to increase independence with fasteners and jewelry, etc.    Time 4    Period Weeks    Status Achieved   LUE 27.09 seconds RUE 30.06 seconds     OT SHORT TERM GOAL #7   Title Pt will obtain a  lightweight object from higher level cabinet with LUE, dominant hand, for increasing independence with ADLs and IADLs.    Time 4    Period Weeks    Status Achieved      OT SHORT TERM GOAL #8   Title Pt will increase keyboarding/typing skills to completing 30 wpm in order to prep for back to work.    Time 4    Period Weeks    Status Achieved   43 wpm            OT Long Term Goals - 12/04/19 1543      OT LONG TERM GOAL #1   Title Pt will be independent with updated HEP for coordination and strengthening 01/02/20    Time 12    Period Weeks    Status On-going      OT LONG TERM GOAL #2   Title Pt will increase grip strength in BUE by 10 lbs in order to increase ability to manage clothing (tights, socks, pants) and increased independence with ADLs.    Baseline LUE 35.4 RUE 33.2    Time 12    Period Weeks    Status Achieved   RUE 55.9 LUE 45.8     OT LONG TERM GOAL #3   Title Pt will improve functional use of LUE as evidenced by performing 60 blocks or more on Box & Blocks test.    Baseline LUE 43    Time 12    Period Weeks    Status Achieved   70     OT LONG TERM GOAL #4   Title Pt will improve 9 hole peg test score to 25 seconds or less with BUE in order to increase fine motor coordination in order to increase independence with fasteners and jewelry, etc.    Baseline RUE 37.84 LUE 38.57    Time 12    Period Weeks    Status Achieved   LUE 20.44 RUE 19.34     OT LONG TERM GOAL #5   Title Pt will obtain an object from cabinet greater than or  equal to 5lbs with LUE, dominant hand, for increasing independence with ADLs and IADLs.    Time 12    Period Weeks    Status New      OT LONG TERM GOAL #6   Title Pt will increase keyboarding/typing skills to completing 50 wpm in order to prep for back to work.    Time 12    Period Weeks    Status Achieved                 Plan - 12/28/19 1505    Clinical Impression Statement Pt is progressing towards goals and working  towards discharge from [facility] and History Detailed Assessment- Review of Records and additional review of physical, cognitive, psychosocial history related to current functional performance    Occupational performance deficits (Please refer to evaluation for details): IADL's;ADL's;Work;Social Participation;Leisure    Body Structure / Function / Physical Skills Strength;Gait;Decreased knowledge of use of DME;ADL;Balance;Dexterity;GMC;Pain;Tone;Body mechanics;UE functional use;Endurance;IADL;ROM;Vision;Coordination;Flexibility;Mobility;Sensation;FMC    Rehab Potential Good    Clinical Decision Making Limited treatment options, no task modification necessary    Comorbidities Affecting Occupational Performance: May have comorbidities impacting occupational performance    Modification or Assistance to Complete Evaluation  No modification of tasks or assist necessary to complete eval    OT Frequency 2x / week    OT Duration 12 weeks    OT Treatment/Interventions Self-care/ADL training;Moist Heat;Fluidtherapy;DME and/or AE instruction;Balance training;Therapeutic activities;Gait Training;Ultrasound;Therapeutic exercise;Scar mobilization;Visual/perceptual remediation/compensation;Passive range of motion;Functional Mobility Training;Neuromuscular education;Electrical Stimulation;Paraffin;Energy conservation;Manual Therapy;Patient/family education    Plan strengthening, arm bike, endurance - plan to dc at end of planning sessions/december    Consulted and Agree with Plan of Care Patient           Patient will benefit from skilled therapeutic intervention in order to improve the following deficits and impairments:   Body Structure / Function / Physical Skills: Strength,Gait,Decreased knowledge of use of DME,ADL,Balance,Dexterity,GMC,Pain,Tone,Body mechanics,UE functional use,Endurance,IADL,ROM,Vision,Coordination,Flexibility,Mobility,Sensation,FMC       Visit  Diagnosis: Unsteadiness on feet  Muscle weakness (generalized)  Other abnormalities of gait and mobility  Other lack of coordination  Visuospatial deficit  Other disturbances of skin sensation    Problem List Patient Active Problem List   Diagnosis Date Noted  . Incomplete paraplegia ([place]) 11/08/2019  . Foot drop, bilateral 11/08/2019  . Spasticity 08/18/2019  . Chronic pain syndrome 08/18/2019  . Nerve pain 08/18/2019  . Clonus 07/21/2019  . Spinal cord tumor   . Pain   . Transaminitis   . Leukocytosis   . Neurogenic bowel   . Postoperative pain   . Ependymoma ([facility]) 06/08/2019  . Acute incomplete quadriplegia ([facility])   . 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 Numbness 05/10/2019  . Weakness of left lower extremity 05/10/2019  . 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 Visual disturbance 05/10/2019    Zachery Conch MOT, OTR/L  12/28/2019, 3:17 PM  [facility] [address] [place], [place], [address] Phone: [phone]   Fax:  [phone]  Name: Jacob Lambert MRN: [id number] Date of Birth: [birth year]

## 2020-01-01 ENCOUNTER — Encounter: Payer: Self-pay | Admitting: Occupational Therapy

## 2020-01-01 ENCOUNTER — Other Ambulatory Visit: Payer: Self-pay

## 2020-01-01 ENCOUNTER — Ambulatory Visit: Payer: BC Managed Care – PPO | Admitting: Occupational Therapy

## 2020-01-01 ENCOUNTER — Ambulatory Visit: Payer: BC Managed Care – PPO

## 2020-01-01 DIAGNOSIS — M6281 Muscle weakness (generalized): Secondary | ICD-10-CM | POA: Diagnosis not present

## 2020-01-01 DIAGNOSIS — R293 Abnormal posture: Secondary | ICD-10-CM | POA: Diagnosis not present

## 2020-01-01 DIAGNOSIS — R2681 Unsteadiness on feet: Secondary | ICD-10-CM

## 2020-01-01 DIAGNOSIS — R2689 Other abnormalities of gait and mobility: Secondary | ICD-10-CM

## 2020-01-01 DIAGNOSIS — R278 Other lack of coordination: Secondary | ICD-10-CM | POA: Diagnosis not present

## 2020-01-01 DIAGNOSIS — R41842 Visuospatial deficit: Secondary | ICD-10-CM

## 2020-01-01 DIAGNOSIS — D497 Neoplasm of unspecified behavior of endocrine glands and other parts of nervous system: Secondary | ICD-10-CM | POA: Diagnosis not present

## 2020-01-01 DIAGNOSIS — R208 Other disturbances of skin sensation: Secondary | ICD-10-CM | POA: Diagnosis not present

## 2020-01-01 DIAGNOSIS — R262 Difficulty in walking, not elsewhere classified: Secondary | ICD-10-CM | POA: Diagnosis not present

## 2020-01-01 NOTE — Addendum Note (Signed)
Addended by: Ileana Ladd on: 01/01/2020 05:13 PM   Modules accepted: Orders

## 2020-01-01 NOTE — Therapy (Signed)
[facility] [address] [place], [place], [address] Phone: [phone]   Fax:  [phone]  Occupational Therapy Treatment  Patient Details  Name: Jacob Lambert MRN: [id number] Date of Birth: 06-18-1985 Referring Provider (OT): Dr. Courtney Heys   Encounter Date: 01/01/2020   OT End of Session - 01/01/20 1450    Visit Number 22    Number of Visits 25    Date for OT Re-Evaluation 01/01/20    Authorization Time Period 30 visit limit - week 12 of 12 (12/27)    OT Start Time 1450    OT Stop Time 1330    OT Time Calculation (min) 1360 min    Activity Tolerance Patient tolerated treatment well    Behavior During Therapy [facility] for tasks assessed/performed           Past Medical History:  Diagnosis Date  . Anxiety   . Clonus   . Depression   . Headache   . Pneumonia    "walking"  . Quadriplegia and quadriparesis ([place])   . Spasticity   . Spinal cord tumor   . Wheelchair bound     Past Surgical History:  Procedure Laterality Date  . LAMINECTOMY N/A 06/01/2019   Procedure: Cervical six-seven Laminoplasty, biopsy and resection of intramedullary spinal cord tumor;  Surgeon: [name], MD;  Location: [facility];  Service: Neurosurgery;  Laterality: N/A;  . NO PAST SURGERIES      There were no vitals filed for this visit.   Subjective Assessment - 01/01/20 1451    Subjective  "I'm really weary to go anywhere these days"    Pertinent History PMH depression    Limitations Fall Risk.    Patient Stated Goals "I want to have as much functional independence as possible" more dexterity and typing skills for work, wear tights and wear heels    Currently in Pain? Yes    Pain Score 4     Pain Location Generalized    Pain Orientation Other (Comment)    Pain Descriptors / Indicators Aching;Burning    Pain Type Neuropathic pain    Pain Onset More than a month ago   varies in onset   Pain Frequency Constant                         OT Treatments/Exercises (OP) - 01/01/20 1454      Hand Exercises   Other Hand Exercises Hand Gripper level 3 with BUE and picking up 1 inch blocks      Work Geophysical data processor Bike 12 minutes on arm bike level 3 (6 minutes forward and 6 minutes backward)      Visual/Perceptual Exercises   Copy this Image Pegboard    Pegboard followed with no difficulty      Fine Motor Coordination (Hand/Wrist)   Fine Motor Coordination Small Pegboard    Small Pegboard with LUE with in hand manipulation - placing 4 pegs in palm and translating one at a time to fingertips in LUE to place in pegboard . Removed with RUE in same fashion                    OT Short Term Goals - 12/11/19 1541      OT SHORT TERM GOAL #1   Title Pt will be independent with HEP for coordination and strengthening 11/06/19    Time 4    Period Weeks  Status Achieved   yellow theraputty 10/8   Target Date 11/06/19      OT SHORT TERM GOAL #2   Title Pt will increase grip strength in BUE by 5 lbs in order to increase ability to manage clothing (tights, socks, pants) and increased independence with ADLs.    Baseline RUE 33.2 lbs LUE 35.4 lbs    Time 4    Period Weeks    Status Achieved   44.3 LUE; 52 RUE     OT SHORT TERM GOAL #3   Title Pt will improve functional use of LUE as evidenced by performing 50 blocks or more on Box & Blocks test.    Baseline LUE 43 blocks    Time 4    Period Weeks    Status Achieved   70     OT SHORT TERM GOAL #4   Title Pt will perform complex meal prep and/or housekeeping tasks with mod I and showing good safety awareness in order to increase independence with IADLs.    Time 4    Period Weeks    Status Not Met   supervision for safety     OT SHORT TERM GOAL #5   Title Pt will verbalize compensatory strategies for visual deficits.    Time 4    Period Weeks    Status Achieved      OT SHORT TERM GOAL #6   Title Pt will improve  9 hole peg test score to 33 seconds or less with BUE in order to increase fine motor coordination in order to increase independence with fasteners and jewelry, etc.    Time 4    Period Weeks    Status Achieved   LUE 27.09 seconds RUE 30.06 seconds     OT SHORT TERM GOAL #7   Title Pt will obtain a lightweight object from higher level cabinet with LUE, dominant hand, for increasing independence with ADLs and IADLs.    Time 4    Period Weeks    Status Achieved      OT SHORT TERM GOAL #8   Title Pt will increase keyboarding/typing skills to completing 30 wpm in order to prep for back to work.    Time 4    Period Weeks    Status Achieved   43 wpm            OT Long Term Goals - 12/04/19 1543      OT LONG TERM GOAL #1   Title Pt will be independent with updated HEP for coordination and strengthening 01/02/20    Time 12    Period Weeks    Status On-going      OT LONG TERM GOAL #2   Title Pt will increase grip strength in BUE by 10 lbs in order to increase ability to manage clothing (tights, socks, pants) and increased independence with ADLs.    Baseline LUE 35.4 RUE 33.2    Time 12    Period Weeks    Status Achieved   RUE 55.9 LUE 45.8     OT LONG TERM GOAL #3   Title Pt will improve functional use of LUE as evidenced by performing 60 blocks or more on Box & Blocks test.    Baseline LUE 43    Time 12    Period Weeks    Status Achieved   70     OT LONG TERM GOAL #4   Title Pt will improve 9 hole peg test score to  25 seconds or less with BUE in order to increase fine motor coordination in order to increase independence with fasteners and jewelry, etc.    Baseline RUE 37.84 LUE 38.57    Time 12    Period Weeks    Status Achieved   LUE 20.44 RUE 19.34     OT LONG TERM GOAL #5   Title Pt will obtain an object from cabinet greater than or equal to 5lbs with LUE, dominant hand, for increasing independence with ADLs and IADLs.    Time 12    Period Weeks    Status New       OT LONG TERM GOAL #6   Title Pt will increase keyboarding/typing skills to completing 50 wpm in order to prep for back to work.    Time 12    Period Weeks    Status Achieved                 Plan - 01/01/20 1533    Clinical Impression Statement Pt is making good progress with OT.    OT Occupational Profile and History Detailed Assessment- Review of Records and additional review of physical, cognitive, psychosocial history related to current functional performance    Occupational performance deficits (Please refer to evaluation for details): IADL's;ADL's;Work;Social Participation;Leisure    Body Structure / Function / Physical Skills Strength;Gait;Decreased knowledge of use of DME;ADL;Balance;Dexterity;GMC;Pain;Tone;Body mechanics;UE functional use;Endurance;IADL;ROM;Vision;Coordination;Flexibility;Mobility;Sensation;FMC    Rehab Potential Good    Clinical Decision Making Limited treatment options, no task modification necessary    Comorbidities Affecting Occupational Performance: May have comorbidities impacting occupational performance    Modification or Assistance to Complete Evaluation  No modification of tasks or assist necessary to complete eval    OT Frequency 2x / week    OT Duration 12 weeks    OT Treatment/Interventions Self-care/ADL training;Moist Heat;Fluidtherapy;DME and/or AE instruction;Balance training;Therapeutic activities;Gait Training;Ultrasound;Therapeutic exercise;Scar mobilization;Visual/perceptual remediation/compensation;Passive range of motion;Functional Mobility Training;Neuromuscular education;Electrical Stimulation;Paraffin;Energy conservation;Manual Therapy;Patient/family education    Plan discharge next session    Consulted and Agree with Plan of Care Patient           Patient will benefit from skilled therapeutic intervention in order to improve the following deficits and impairments:   Body Structure / Function / Physical Skills:  Strength,Gait,Decreased knowledge of use of DME,ADL,Balance,Dexterity,GMC,Pain,Tone,Body mechanics,UE functional use,Endurance,IADL,ROM,Vision,Coordination,Flexibility,Mobility,Sensation,FMC       Visit Diagnosis: Muscle weakness (generalized)  Other lack of coordination  Visuospatial deficit  Other abnormalities of gait and mobility  Unsteadiness on feet    Problem List Patient Active Problem List   Diagnosis Date Noted  . Incomplete paraplegia ([place]) 11/08/2019  . Foot drop, bilateral 11/08/2019  . Spasticity 08/18/2019  . Chronic pain syndrome 08/18/2019  . Nerve pain 08/18/2019  . Clonus 07/21/2019  . Spinal cord tumor   . Pain   . Transaminitis   . Leukocytosis   . Neurogenic bowel   . Postoperative pain   . 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 Acute incomplete quadriplegia ([facility])   . 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 Numbness 05/10/2019  . Weakness of left lower extremity 05/10/2019  . 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 Visual disturbance 05/10/2019    Zachery Conch MOT, OTR/L  01/01/2020, 3:34 PM  [facility] [address] [place], [place], [address] Phone: [phone]   Fax:  [phone]  Name: Jacob Lambert MRN: [id number] Date of Birth: Aug 10, 1985

## 2020-01-01 NOTE — Progress Notes (Signed)
°   01/01/20 1623  PT LONG TERM GOAL #1  Title Patient will demo >54/56 on berg balance scale to improve functional balance and reduce fall risk (Target date 01/10/20)  Baseline 40/56 (eval); 47/56 (11/15/19); 50/56 without GRAFO (01/01/20)  Time 6  Period Weeks  Status Revised  Target Date 02/12/20  PT LONG TERM GOAL #2  Title Patient will be able to ambulate >1100 feet with cane and with or without GRAFO to improve short distance community ambulation.  Baseline only walks with RW; 672' on 6 MWT with cane + GRAFO (11/15/19)  Time 6  Period Weeks  Status Revised  Target Date 02/12/20  PT LONG TERM GOAL #3  Title Patient will demo 4+/5 strength in bil LE grossly to improve functional strength with transfers (Target date 01/10/20)  Baseline 3+-4/5 grossly in bil LE  Time 6  Period Weeks  Status Revised  Target Date 02/12/20

## 2020-01-01 NOTE — Therapy (Signed)
[facility] [address] [place], [place], [address] Phone: [phone]   Fax:  [phone]  Physical Therapy Re- certification Note  Patient Details  Name: Jacob Lambert MRN: [id number] Date of Birth: December 17, 1985 Referring Provider (PT): Dr. Courtney Heys   Encounter Date: 01/01/2020   PT End of Session - 01/01/20 1611    Visit Number 27    Number of Visits 33    Date for PT Re-Evaluation 01/10/20    Authorization Type [employer]    PT Start Time 1530    PT Stop Time 1615    PT Time Calculation (min) 45 min    Equipment Utilized During Treatment Gait belt    Activity Tolerance Patient tolerated treatment well;No increased pain    Behavior During Therapy [facility] for tasks assessed/performed           Past Medical History:  Diagnosis Date  . Anxiety   . Clonus   . Depression   . Headache   . Pneumonia    "walking"  . Quadriplegia and quadriparesis ([place])   . Spasticity   . Spinal cord tumor   . Wheelchair bound     Past Surgical History:  Procedure Laterality Date  . LAMINECTOMY N/A 06/01/2019   Procedure: Cervical six-seven Laminoplasty, biopsy and resection of intramedullary spinal cord tumor;  Surgeon: [name], MD;  Location: [facility];  Service: Neurosurgery;  Laterality: N/A;  . NO PAST SURGERIES      There were no vitals filed for this visit.   Subjective Assessment - 01/01/20 1533    Subjective After trampoline, muscles were sore for 1 day. E-stim is going well.    Limitations Standing;Walking;House hold activities    How long can you sit comfortably? no issues    How long can you stand comfortably? <5 min              [facility] PT Assessment - 01/01/20 1537      Strength   Right Hip Flexion 4+/5    Right Hip Extension 3-/5    Right Hip ABduction 4-/5    Left Hip Flexion 5/5    Left Hip Extension 4+/5    Left Hip ABduction 5/5    Right Knee Flexion 3+/5    Right Knee Extension 4/5     Left Knee Flexion 4+/5    Left Knee Extension 5/5    Right Ankle Dorsiflexion 4/5    Right Ankle Plantar Flexion 2+/5    Left Ankle Dorsiflexion 5/5    Left Ankle Plantar Flexion 3+/5      Berg Balance Test   Sit to Stand Able to stand without using hands and stabilize independently    Standing Unsupported Able to stand safely 2 minutes    Sitting with Back Unsupported but Feet Supported on Floor or Stool Able to sit safely and securely 2 minutes    Stand to Sit Sits safely with minimal use of hands    Transfers Able to transfer safely, minor use of hands    Standing Unsupported with Eyes Closed Able to stand 10 seconds safely    Standing Unsupported with Feet Together Able to place feet together independently and stand 1 minute safely    From Standing, Reach Forward with Outstretched Arm Can reach forward >12 cm safely (5")    From Standing Position, Pick up Object from Floor Able to pick up shoe, needs supervision    From Standing Position, Turn to Look Behind  Over each Shoulder Looks behind from both sides and weight shifts well    Turn 360 Degrees Able to turn 360 degrees safely in 4 seconds or less    Standing Unsupported, Alternately Place Feet on Step/Stool Able to stand independently and safely and complete 8 steps in 20 seconds    Standing Unsupported, One Foot in Front Able to plae foot ahead of the other independently and hold 30 seconds    Standing on One Leg Tries to lift leg/unable to hold 3 seconds but remains standing independently    Total Score 50    Berg comment: 50/56 without GRAFO, low fall risk      Timed Up and Go Test   Normal TUG (seconds) 9.63    TUG Comments with rollator                            Current Goal progress:       PT Short Term Goals - 01/01/20 1533      PT SHORT TERM GOAL #1   Title Patient will be able to ambulate 115' with cane to improve short distance ambulation within home    Baseline 115' with min/mod A with  quad cane due to multi LOB and scissoring 25% (10/13/19); 672' with cane and bilat GRAFOs (11/15/19)    Time 4    Period Weeks    Status Achieved    Target Date 10/18/19      PT SHORT TERM GOAL #2   Title Pt will demo <12 seconds on timed up and go with RW to improve functional mobility (target date 01/10/20)    Baseline 14 seconds RW (eval); 13 seconds rollator (10/13/19); 13.5 seconds SPC + bilat GRAFOs (11/15/19); 9.63 seconds with rollator and bil GRAFO    Time 8    Period Weeks    Status Achieved    Target Date 01/10/20      PT SHORT TERM GOAL #3   Title Patient will be able to go up and down a ramp and curb step to be able to negotiate community    Baseline CGA with rollator for 3' long ramp, forward and over aerobic stepper acting as curb x10 (10/13/19)    Time 4    Period Weeks    Status Achieved    Target Date 10/18/19             PT Long Term Goals - 01/01/20 1537      PT LONG TERM GOAL #1   Title Patient will demo >50/56 on berg balance scale to improve functional balance and reduce fall risk (Target date 01/10/20)    Baseline 40/56 (eval); 47/56 (11/15/19); 50/56 without GRAFO (01/01/20)    Time 8    Period Weeks    Status Achieved      PT LONG TERM GOAL #2   Title Patient will be able to ambulate >500 feet with cane to improve short distance community ambulation.    Baseline only walks with RW; 672' on 6 MWT with cane + GRAFO (11/15/19)    Time 8    Period Weeks    Status Achieved      PT LONG TERM GOAL #3   Title Patient will demo 4+/5 strength in bil LE grossly to improve functional strength with transfers (Target date 01/10/20)    Baseline 3+-4/5 grossly in bil LE    Time 8    Period Weeks    Status  Revised      PT LONG TERM GOAL #4   Title Patient will be able to go up and down 12 steps with use of 1-2 rails to be able to go up and down steps at home (Target date 01/10/20)    Baseline 2 steps (with one rail) (Eval); 4 steps, but very unsafe with descent  (11/15/19); rail on R going up and cane in other hand- SBA, pt uses step to pattern    Time 8    Period Weeks    Status Achieved      PT LONG TERM GOAL #5   Title Pt with goal to improve 6 MWT using rollator and GRAFOs to >/= 1000' or using SPC and GRAFOs to >/=800' (Target date 01/10/20)    Baseline 672' with [facility] & GRAFOs (11/15/19);936' with [facility] and without GRAFO (01/01/20)    Time 8    Period Weeks    Status Achieved            New Long term goals:   01/01/20 1623  PT LONG TERM GOAL #1  Title Patient will demo >54/56 on berg balance scale to improve functional balance and reduce fall risk (Target date 01/10/20)  Baseline 40/56 (eval); 47/56 (11/15/19); 50/56 without GRAFO (01/01/20)  Time 6  Period Weeks  Status Revised  Target Date 02/12/20  PT LONG TERM GOAL #2  Title Patient will be able to ambulate >1100 feet with cane and with or without GRAFO to improve short distance community ambulation.  Baseline only walks with RW; 672' on 6 MWT with cane + GRAFO (11/15/19)  Time 6  Period Weeks  Status Revised  Target Date 02/12/20  PT LONG TERM GOAL #3  Title Patient will demo 4+/5 strength in bil LE grossly to improve functional strength with transfers (Target date 01/10/20)  Baseline 3+-4/5 grossly in bil LE  Time 6  Period Weeks  Status Revised  Target Date 02/12/20       Plan - 01/01/20 1620    Clinical Impression Statement Patient has been seen for total of 27 sessions in physical therapy for gait, balance and weakness. Patient has made significant progress towards their short term and long term goals. Patient still has significant potential to improve to improve strength, gait and balance to maximize functional independence. New goals were established today to continue progressing patient's maximum function. Patient will benefit from continued skilled PT    Personal Factors and Comorbidities Age;Past/Current Experience;Comorbidity 2;Time since onset of  injury/illness/exacerbation;Transportation    Examination-Activity Limitations Bathing;Lift;Squat;Stairs;Stand;Transfers    Examination-Participation Restrictions Cleaning;Community Activity;Driving;Laundry;Occupation;Shop;Yard Work    Rehab Potential Fair    PT Frequency 2x / week    PT Duration 6 weeks    PT Treatment/Interventions ADLs/Self Care Home Management;Gait training;Stair training;Functional mobility training;Therapeutic activities;Therapeutic exercise;Balance training;Neuromuscular re-education;Manual techniques;Wheelchair mobility training;Patient/family education;Passive range of motion;Energy conservation;Vestibular;Visual/perceptual remediation/compensation;Joint Manipulations;Biofeedback;Electrical Stimulation    PT Next Visit Plan Work on [employer] (Fwd reach, picking object from floor, SLS, tandem stance); work on gait speed, issue HEP for hamstring curl and heel raises    PT Home Exercise Plan Access Code: [email or web address]    Consulted and Agree with Plan of Care Patient           Patient will benefit from skilled therapeutic intervention in order to improve the following deficits and impairments:  Abnormal gait,Decreased activity tolerance,Decreased balance,Decreased cognition,Decreased mobility,Decreased endurance,Decreased coordination,Decreased range of motion,Decreased strength,Dizziness,Difficulty walking,Impaired sensation,Impaired tone,Impaired UE functional use,Postural dysfunction,Pain  Visit Diagnosis: Unsteadiness on feet  Muscle weakness (generalized)  Other abnormalities of gait and mobility  Difficulty in walking, not elsewhere classified  Spinal cord tumor     Problem List Patient Active Problem List   Diagnosis Date Noted  . Incomplete paraplegia ([place]) 11/08/2019  . Foot drop, bilateral 11/08/2019  . Spasticity 08/18/2019  . Chronic pain syndrome 08/18/2019  . Nerve pain 08/18/2019  . Clonus 07/21/2019  . Spinal cord tumor   . Pain   .  Transaminitis   . Leukocytosis   . Neurogenic bowel   . Postoperative pain   . 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 Acute incomplete quadriplegia ([facility])   . 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 Numbness 05/10/2019  . Weakness of left lower extremity 05/10/2019  . 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 Visual disturbance 05/10/2019    Kerrie Pleasure, PT 01/01/2020, 4:23 PM  [facility] [address] [place], [place], [address] Phone: [phone]   Fax:  [phone]  Name: Geddy Paluzzi MRN: [id number] Date of Birth: January 27, 1985

## 2020-01-01 NOTE — Addendum Note (Signed)
Addended by: Kerrie Pleasure on: 01/01/2020 05:11 PM   Modules accepted: Orders

## 2020-01-03 ENCOUNTER — Other Ambulatory Visit: Payer: Self-pay

## 2020-01-03 ENCOUNTER — Ambulatory Visit: Payer: BC Managed Care – PPO | Admitting: Occupational Therapy

## 2020-01-03 ENCOUNTER — Encounter: Payer: Self-pay | Admitting: Occupational Therapy

## 2020-01-03 ENCOUNTER — Encounter: Payer: Self-pay | Admitting: Physical Therapy

## 2020-01-03 ENCOUNTER — Ambulatory Visit: Payer: BC Managed Care – PPO | Admitting: Physical Therapy

## 2020-01-03 DIAGNOSIS — R278 Other lack of coordination: Secondary | ICD-10-CM

## 2020-01-03 DIAGNOSIS — R2689 Other abnormalities of gait and mobility: Secondary | ICD-10-CM

## 2020-01-03 DIAGNOSIS — M6281 Muscle weakness (generalized): Secondary | ICD-10-CM | POA: Diagnosis not present

## 2020-01-03 DIAGNOSIS — R262 Difficulty in walking, not elsewhere classified: Secondary | ICD-10-CM | POA: Diagnosis not present

## 2020-01-03 DIAGNOSIS — R41842 Visuospatial deficit: Secondary | ICD-10-CM

## 2020-01-03 DIAGNOSIS — D497 Neoplasm of unspecified behavior of endocrine glands and other parts of nervous system: Secondary | ICD-10-CM | POA: Diagnosis not present

## 2020-01-03 DIAGNOSIS — R208 Other disturbances of skin sensation: Secondary | ICD-10-CM | POA: Diagnosis not present

## 2020-01-03 DIAGNOSIS — R2681 Unsteadiness on feet: Secondary | ICD-10-CM

## 2020-01-03 DIAGNOSIS — R293 Abnormal posture: Secondary | ICD-10-CM | POA: Diagnosis not present

## 2020-01-03 NOTE — Patient Instructions (Signed)
  Strengthening: Resisted Flexion   Hold tubing with __right_ arm(s) at side. Pull forward and up. Move shoulder through pain-free range of motion. Repeat __10__ times per set.  Do _1-2_ sessions per day , every other day. Repeat other side.   Strengthening: Resisted Extension   Hold tubing in _right_ hand(s), arm forward. Pull arm back, elbow straight. Repeat _10___ times per set. Do _1-2___ sessions per day, every other day. Repeat other side.   Resisted Horizontal Abduction: Bilateral   Sit or stand, tubing in both hands, arms out in front. Keeping arms straight, pinch shoulder blades together and stretch arms out. Repeat _10___ times per set. Do _1-2___ sessions per day, every other day.   Elbow Flexion: Resisted   With tubing held in _right__ hand(s) and other end secured under foot, curl arm up as far as possible. Repeat _10___ times per set. Do _1-2___ sessions per day, every other day. Repeat with other side.    Elbow Extension: Resisted   Sit in chair with resistive band secured at armrest (or hold with other hand) and _right_ elbow bent. Straighten elbow. Repeat _10___ times per set.  Do _1-2___ sessions per day, every other day. Repeat with other side.   Copyright  VHI. All rights reserved.   1. Grip Strengthening (Resistive Putty)   Squeeze putty using thumb and all fingers. Repeat _20___ times. Do __2__ sessions per day.   2. Roll putty into tube on table and pinch between each finger and thumb x 10 reps each. (can do ring and small finger together)     Copyright  VHI. All rights reserved.  1. Grip Strengthening (Resistive Putty)   Squeeze putty using thumb and all fingers. Repeat _20___ times. Do __2__ sessions per day.   2. Roll putty into tube on table and pinch between each finger and thumb x 10 reps each. (can do ring and small finger together)     Copyright  VHI. All rights reserved.

## 2020-01-03 NOTE — Therapy (Signed)
[facility] [address] [place], [place], [address] Phone: [phone]   Fax:  [phone]  Occupational Therapy Treatment & Discharge  Patient Details  Name: Jacob Lambert MRN: [id number] Date of Birth: 1985-01-20 Referring Provider (OT): Dr. Courtney Heys   Encounter Date: 01/03/2020   OT End of Session - 01/03/20 1533    Visit Number 23    Number of Visits 25    Date for OT Re-Evaluation 01/01/20    Authorization Time Period 30 visit limit - week 12 of 12 (12/27)    OT Start Time 1532    OT Stop Time 1615    OT Time Calculation (min) 43 min    Activity Tolerance Patient tolerated treatment well    Behavior During Therapy [facility] for tasks assessed/performed           Past Medical History:  Diagnosis Date  . Anxiety   . Clonus   . Depression   . Headache   . Pneumonia    "walking"  . Quadriplegia and quadriparesis ([place])   . Spasticity   . Spinal cord tumor   . Wheelchair bound     Past Surgical History:  Procedure Laterality Date  . LAMINECTOMY N/A 06/01/2019   Procedure: Cervical six-seven Laminoplasty, biopsy and resection of intramedullary spinal cord tumor;  Surgeon: [name], MD;  Location: [facility];  Service: Neurosurgery;  Laterality: N/A;  . NO PAST SURGERIES      There were no vitals filed for this visit.   Subjective Assessment - 01/03/20 1534    Subjective  "I am not going anywhere for the next week" "PT just gave me a lot of pain"    Pertinent History PMH depression    Limitations Fall Risk.    Patient Stated Goals "I want to have as much functional independence as possible" more dexterity and typing skills for work, wear tights and wear heels    Currently in Pain? Yes    Pain Score 6     Pain Location Leg    Pain Orientation Left;Right    Pain Descriptors / Indicators Aching;Shooting;Tingling;Stabbing    Pain Type Neuropathic pain    Pain Onset More than a month ago    varies in onset   Pain Frequency Intermittent           OCCUPATIONAL THERAPY DISCHARGE SUMMARY  Visits from Start of Care: 23  Current functional level related to goals / functional outcomes: Pt has met all STGs and LTGs and has made good progress with fine motor coordination and grip strength. Pt has increased grip strength by 30-40 lbs in BUE.    Remaining deficits: Some unsteadiness on feet impeding overall ability to complete IADLs.    Education / Equipment: HEPs for grip strengthening and overall UE strengthening  Plan: Patient agrees to discharge.  Patient goals were met. Patient is being discharged due to meeting the stated rehab goals.  ?????        Assessed goals and determined patient has met all STGs and LTGs and ready for discharge.     Previous reading: LUE 55.3 lbs RUE 67    Reading at discharge: 01/03/2020     LUE 61.7 lb ; RUE 76.7 lbs       OT Treatments/Exercises (OP) - 01/03/20 1556      Fine Motor Coordination (Hand/Wrist)   Fine Motor Coordination Handwriting    Handwriting typing test - 91% accuracy and 67 wpm  OT Short Term Goals - 12/11/19 1541      OT SHORT TERM GOAL #1   Title Pt will be independent with HEP for coordination and strengthening 11/06/19    Time 4    Period Weeks    Status Achieved   yellow theraputty 10/8   Target Date 11/06/19      OT SHORT TERM GOAL #2   Title Pt will increase grip strength in BUE by 5 lbs in order to increase ability to manage clothing (tights, socks, pants) and increased independence with ADLs.    Baseline RUE 33.2 lbs LUE 35.4 lbs    Time 4    Period Weeks    Status Achieved   44.3 LUE; 52 RUE     OT SHORT TERM GOAL #3   Title Pt will improve functional use of LUE as evidenced by performing 50 blocks or more on Box & Blocks test.    Baseline LUE 43 blocks    Time 4    Period Weeks    Status Achieved   70     OT SHORT TERM GOAL #4   Title Pt will perform  complex meal prep and/or housekeeping tasks with mod I and showing good safety awareness in order to increase independence with IADLs.    Time 4    Period Weeks    Status Not Met   supervision for safety     OT SHORT TERM GOAL #5   Title Pt will verbalize compensatory strategies for visual deficits.    Time 4    Period Weeks    Status Achieved      OT SHORT TERM GOAL #6   Title Pt will improve 9 hole peg test score to 33 seconds or less with BUE in order to increase fine motor coordination in order to increase independence with fasteners and jewelry, etc.    Time 4    Period Weeks    Status Achieved   LUE 27.09 seconds RUE 30.06 seconds     OT SHORT TERM GOAL #7   Title Pt will obtain a lightweight object from higher level cabinet with LUE, dominant hand, for increasing independence with ADLs and IADLs.    Time 4    Period Weeks    Status Achieved      OT SHORT TERM GOAL #8   Title Pt will increase keyboarding/typing skills to completing 30 wpm in order to prep for back to work.    Time 4    Period Weeks    Status Achieved   43 wpm            OT Long Term Goals - 01/03/20 1535      OT LONG TERM GOAL #1   Title Pt will be independent with updated HEP for coordination and strengthening 01/02/20    Time 12    Period Weeks    Status Achieved      OT LONG TERM GOAL #2   Title Pt will increase grip strength in BUE by 10 lbs in order to increase ability to manage clothing (tights, socks, pants) and increased independence with ADLs.    Baseline LUE 35.4 RUE 33.2    Time 12    Period Weeks    Status Achieved   LUE 55.3 lbs RUE 67     ; d/c LUE 61.7 lbs RUE 76.3 lbs     OT LONG TERM GOAL #3   Title Pt will improve functional use of LUE as  evidenced by performing 60 blocks or more on Box & Blocks test.    Baseline LUE 43    Time 12    Period Weeks    Status Achieved   70     OT LONG TERM GOAL #4   Title Pt will improve 9 hole peg test score to 25 seconds or less with BUE  in order to increase fine motor coordination in order to increase independence with fasteners and jewelry, etc.    Baseline RUE 37.84 LUE 38.57    Time 12    Period Weeks    Status Achieved   LUE 20.44 RUE 19.34     OT LONG TERM GOAL #5   Title Pt will obtain an object from cabinet greater than or equal to 5lbs with LUE, dominant hand, for increasing independence with ADLs and IADLs.    Time 12    Period Weeks    Status Achieved      OT LONG TERM GOAL #6   Title Pt will increase keyboarding/typing skills to completing 50 wpm in order to prep for back to work.    Time 12    Period Weeks    Status Achieved                 Plan - 01/03/20 1548    Clinical Impression Statement Pt has met all STGs and LTGs and has made good progress with fine motor coordination and grip strength. Pt has increased grip strength by 30-40 lbs in BUE. Pt has met all stated rehab goals and is agreeable for discharge at this time.    OT Occupational Profile and History Detailed Assessment- Review of Records and additional review of physical, cognitive, psychosocial history related to current functional performance    Occupational performance deficits (Please refer to evaluation for details): IADL's;ADL's;Work;Social Participation;Leisure    Body Structure / Function / Physical Skills Strength;Gait;Decreased knowledge of use of DME;ADL;Balance;Dexterity;GMC;Pain;Tone;Body mechanics;UE functional use;Endurance;IADL;ROM;Vision;Coordination;Flexibility;Mobility;Sensation;FMC    Rehab Potential Good    Clinical Decision Making Limited treatment options, no task modification necessary    Comorbidities Affecting Occupational Performance: May have comorbidities impacting occupational performance    Modification or Assistance to Complete Evaluation  No modification of tasks or assist necessary to complete eval    OT Frequency 2x / week    OT Duration 12 weeks    OT Treatment/Interventions Self-care/ADL  training;Moist Heat;Fluidtherapy;DME and/or AE instruction;Balance training;Therapeutic activities;Gait Training;Ultrasound;Therapeutic exercise;Scar mobilization;Visual/perceptual remediation/compensation;Passive range of motion;Functional Mobility Training;Neuromuscular education;Electrical Stimulation;Paraffin;Energy conservation;Manual Therapy;Patient/family education    Plan discharge    Consulted and Agree with Plan of Care Patient           Patient will benefit from skilled therapeutic intervention in order to improve the following deficits and impairments:   Body Structure / Function / Physical Skills: Strength,Gait,Decreased knowledge of use of DME,ADL,Balance,Dexterity,GMC,Pain,Tone,Body mechanics,UE functional use,Endurance,IADL,ROM,Vision,Coordination,Flexibility,Mobility,Sensation,FMC       Visit Diagnosis: Muscle weakness (generalized)  Other lack of coordination  Visuospatial deficit  Other abnormalities of gait and mobility  Unsteadiness on feet    Problem List Patient Active Problem List   Diagnosis Date Noted  . Incomplete paraplegia ([place]) 11/08/2019  . Foot drop, bilateral 11/08/2019  . Spasticity 08/18/2019  . Chronic pain syndrome 08/18/2019  . Nerve pain 08/18/2019  . Clonus 07/21/2019  . Spinal cord tumor   . Pain   . Transaminitis   . Leukocytosis   . Neurogenic bowel   . Postoperative pain   . 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  Acute incomplete quadriplegia ([facility])   . 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 Numbness 05/10/2019  . Weakness of left lower extremity 05/10/2019  . 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 Visual disturbance 05/10/2019    Zachery Conch MOT, OTR/L  01/03/2020, 4:03 PM  [facility] [address] [place], [place], [address] Phone: [phone]   Fax:  [phone]  Name: Jacob Lambert MRN: [id number] Date of Birth: 1985/02/26

## 2020-01-04 NOTE — Therapy (Signed)
[facility] [address] [place], [place], [address] Phone: [phone]   Fax:  [phone]  Physical Therapy Treatment  Patient Details  Name: Jacob Lambert MRN: [id number] Date of Birth: 1985-06-27 Referring Provider (PT): Dr. Genice Rouge   Encounter Date: 01/03/2020   PT End of Session - 01/03/20 1452    Visit Number 28    Number of Visits 39    Date for PT Re-Evaluation 02/12/20    Authorization Type [employer]    PT Start Time 1448    PT Stop Time 1530    PT Time Calculation (min) 42 min    Equipment Utilized During Treatment Gait belt    Activity Tolerance Patient tolerated treatment well;No increased pain    Behavior During Therapy WFL for tasks assessed/performed           Past Medical History:  Diagnosis Date  . Anxiety   . Clonus   . Depression   . Headache   . Pneumonia    "walking"  . Quadriplegia and quadriparesis ([facility])   . Spasticity   . Spinal cord tumor   . Wheelchair bound     Past Surgical History:  Procedure Laterality Date  . LAMINECTOMY N/A 06/01/2019   Procedure: Cervical six-seven Laminoplasty, biopsy and resection of intramedullary spinal cord tumor;  Surgeon: Jadene Pierini, MD;  Location: [facility];  Service: Neurosurgery;  Laterality: N/A;  . NO PAST SURGERIES      There were no vitals filed for this visit.   Subjective Assessment - 01/03/20 1450    Subjective No new complaints No falls.    Limitations Standing;Walking;House hold activities    How long can you sit comfortably? no issues    How long can you stand comfortably? <5 min    How long can you walk comfortably? <0.25 mile    Patient Stated Goals Progress walking    Currently in Pain? Yes    Pain Score 5     Pain Location Generalized    Pain Descriptors / Indicators Aching;Burning    Pain Type Neuropathic pain    Pain Onset More than a month ago    Pain Frequency Constant    Aggravating Factors  poor  positioning, increased  activity, brace wear    Pain Relieving Factors pain meds/patch, CBD creame/oral droplets                 [facility] Adult PT Treatment/Exercise - 01/03/20 1453      Transfers   Transfers Sit to Stand;Stand to Sit    Sit to Stand 5: Supervision;From chair/3-in-1;Without upper extremity assist;From bed    Stand to Sit 5: Supervision;With upper extremity assist;With armrests;To bed;To chair/3-in-1      Ambulation/Gait   Ambulation/Gait Yes    Ambulation/Gait Assistance 6: Modified independent (Device/Increase time);5: Supervision    Ambulation/Gait Assistance Details Mod I with rollator/braces into/out of gym. supervision/Min guard assist with use of Bioness to bil LE's within session for safety.    Assistive device Rollator    Gait Pattern Step-through pattern;Narrow base of support;Lateral trunk lean to right    Ambulation Surface Level;Indoor      Exercises   Exercises Other Exercises    Other Exercises  concurrent with Bioness to bil LE's: seated at edge of mat: long arc quads for 10 reps each side with on time, rest with off time, then with bil hands on chair seat in front of pt- had pt "hover" buttocks over  mat with on time, rest in sitting with off time for 10 reps;  standing with UE support on sturdy surface: mini squats for 10 reps with squat with on times, resting in up postion with off times. then at bottom of steps: forward step ups with on times, resting on ground with off times for 10 reps each side. min guard assist for safety with cues on form/technique and to decrease compensatory movements.      Theme park manager bil ant tib and Engineer, building services for increase muscle activation and strengthening    Electrical Stimulation Parameters refer to tablet 1 for adjusted parameters; quick fit electrodes.    Electrical Stimulation Goals Strength;Neuromuscular facilitation                     PT Short Term Goals - 01/01/20 1533      PT SHORT TERM GOAL #1   Title Patient will be able to ambulate 115' with cane to improve short distance ambulation within home    Baseline 115' with min/mod A with quad cane due to multi LOB and scissoring 25% (10/13/19); 672' with cane and bilat GRAFOs (11/15/19)    Time 4    Period Weeks    Status Achieved    Target Date 10/18/19      PT SHORT TERM GOAL #2   Title Pt will demo <12 seconds on timed up and go with RW to improve functional mobility (target date 01/10/20)    Baseline 14 seconds RW (eval); 13 seconds rollator (10/13/19); 13.5 seconds SPC + bilat GRAFOs (11/15/19); 9.63 seconds with rollator and bil GRAFO    Time 8    Period Weeks    Status Achieved    Target Date 01/10/20      PT SHORT TERM GOAL #3   Title Patient will be able to go up and down a ramp and curb step to be able to negotiate community    Baseline CGA with rollator for 3' long ramp, forward and over aerobic stepper acting as curb x10 (10/13/19)    Time 4    Period Weeks    Status Achieved    Target Date 10/18/19             PT Long Term Goals - 01/01/20 1623      PT LONG TERM GOAL #1   Title Patient will demo >54/56 on berg balance scale to improve functional balance and reduce fall risk (Target date 01/10/20)    Baseline 40/56 (eval); 47/56 (11/15/19); 50/56 without GRAFO (01/01/20)    Time 6    Period Weeks    Status Revised    Target Date 02/12/20      PT LONG TERM GOAL #2   Title Patient will be able to ambulate >1100 feet with cane and with or without GRAFO to improve short distance community ambulation.    Baseline only walks with RW; 672' on 6 MWT with cane + GRAFO (11/15/19)    Time 6    Period Weeks    Status Revised    Target Date 02/12/20      PT LONG TERM GOAL #3   Title Patient will demo 4+/5 strength in bil LE grossly to improve functional strength with transfers (Target date 01/10/20)    Baseline 3+-4/5 grossly in bil LE     Time 6    Period Weeks    Status Revised    Target  Date 02/12/20      PT LONG TERM GOAL #4   Title --    Baseline --    Time --    Period --    Status --      PT LONG TERM GOAL #5   Title --    Baseline --    Time --    Period --    Status --                 Plan - 01/03/20 1452    Clinical Impression Statement Today's skilled session continued with use of Bioness to bil LE's concurrent with ex's for strengthening and gait with rollator between activities. Rest breaks taken as needed. The pt continues to have good response to Bioness. The pt is progressing toward goals and should benefit from continued PT to progress toward unmet goals.    Personal Factors and Comorbidities Age;Past/Current Experience;Comorbidity 2;Time since onset of injury/illness/exacerbation;Transportation    Examination-Activity Limitations Bathing;Lift;Squat;Stairs;Stand;Transfers    Examination-Participation Restrictions Cleaning;Community Activity;Driving;Laundry;Occupation;Shop;Yard Work    Rehab Potential Fair    PT Frequency 2x / week    PT Duration 6 weeks    PT Treatment/Interventions ADLs/Self Care Home Management;Gait training;Stair training;Functional mobility training;Therapeutic activities;Therapeutic exercise;Balance training;Neuromuscular re-education;Manual techniques;Wheelchair mobility training;Patient/family education;Passive range of motion;Energy conservation;Vestibular;Visual/perceptual remediation/compensation;Joint Manipulations;Biofeedback;Electrical Stimulation    PT Next Visit Plan added HS curls and standing heel raises to HEP next session. continue with Bioness to bil LE'S concurrent with strengthening, begin to incorporated balance activities such as picking up objects, low squats to reach the floor, single leg stance activities and tandem stance both with and without Bioness.    PT Home Exercise Plan Access Code: [email or web address]    Consulted and Agree with Plan of Care  Patient           Patient will benefit from skilled therapeutic intervention in order to improve the following deficits and impairments:  Abnormal gait,Decreased activity tolerance,Decreased balance,Decreased cognition,Decreased mobility,Decreased endurance,Decreased coordination,Decreased range of motion,Decreased strength,Dizziness,Difficulty walking,Impaired sensation,Impaired tone,Impaired UE functional use,Postural dysfunction,Pain  Visit Diagnosis: Muscle weakness (generalized)  Other abnormalities of gait and mobility  Unsteadiness on feet  Difficulty in walking, not elsewhere classified     Problem List Patient Active Problem List   Diagnosis Date Noted  . Incomplete paraplegia ([place]) 11/08/2019  . Foot drop, bilateral 11/08/2019  . Spasticity 08/18/2019  . Chronic pain syndrome 08/18/2019  . Nerve pain 08/18/2019  . Clonus 07/21/2019  . Spinal cord tumor   . Pain   . Transaminitis   . Leukocytosis   . Neurogenic bowel   . Postoperative pain   . 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 Acute incomplete quadriplegia ([facility])   . 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 Numbness 05/10/2019  . Weakness of left lower extremity 05/10/2019  . 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 Visual disturbance 05/10/2019    Willow Ora, PTA, [facility] [address], [place], [place] [address] [phone] 01/04/20, 7:31 PM   Name: Jacob Lambert MRN: [id number] Date of Birth: 08-21-1985

## 2020-01-09 ENCOUNTER — Other Ambulatory Visit: Payer: Self-pay

## 2020-01-09 ENCOUNTER — Encounter: Payer: Self-pay | Admitting: Physical Therapy

## 2020-01-09 ENCOUNTER — Ambulatory Visit: Payer: BC Managed Care – PPO | Attending: Physical Medicine and Rehabilitation | Admitting: Physical Therapy

## 2020-01-09 DIAGNOSIS — M6281 Muscle weakness (generalized): Secondary | ICD-10-CM | POA: Diagnosis not present

## 2020-01-09 DIAGNOSIS — R262 Difficulty in walking, not elsewhere classified: Secondary | ICD-10-CM | POA: Insufficient documentation

## 2020-01-09 DIAGNOSIS — R2681 Unsteadiness on feet: Secondary | ICD-10-CM | POA: Insufficient documentation

## 2020-01-09 DIAGNOSIS — R2689 Other abnormalities of gait and mobility: Secondary | ICD-10-CM | POA: Insufficient documentation

## 2020-01-09 NOTE — Therapy (Signed)
[facility] [address] [place], [place], [address] Phone: [phone]   Fax:  [phone]  Physical Therapy Treatment  Patient Details  Name: Jacob Lambert MRN: [id number] Date of Birth: October 07, 1985 Referring Provider (PT): Dr. Courtney Heys   Encounter Date: 01/09/2020   PT End of Session - 01/09/20 1537    Visit Number 29    Number of Visits 39    Date for PT Re-Evaluation 02/12/20    Authorization Type BCBS    PT Start Time 1533    PT Stop Time 1616    PT Time Calculation (min) 43 min    Equipment Utilized During Treatment Other (comment)   Bioness to bil LE's   Activity Tolerance Patient tolerated treatment well;No increased pain    Behavior During Therapy WFL for tasks assessed/performed           Past Medical History:  Diagnosis Date  . Anxiety   . Clonus   . Depression   . Headache   . Pneumonia    "walking"  . Quadriplegia and quadriparesis ([place])   . Spasticity   . Spinal cord tumor   . Wheelchair bound     Past Surgical History:  Procedure Laterality Date  . LAMINECTOMY N/A 06/01/2019   Procedure: Cervical six-seven Laminoplasty, biopsy and resection of intramedullary spinal cord tumor;  Surgeon: [name], MD;  Location: [facility];  Service: Neurosurgery;  Laterality: N/A;  . NO PAST SURGERIES      There were no vitals filed for this visit.   Subjective Assessment - 01/09/20 1535    Subjective No new complaitns. No falls.    Limitations Standing;Walking;House hold activities    How long can you sit comfortably? no issues    How long can you stand comfortably? <5 min    How long can you walk comfortably? <0.25 mile    Patient Stated Goals Progress walking    Currently in Pain? Yes    Pain Score 4     Pain Location Leg    Pain Orientation Right;Left    Pain Descriptors / Indicators Aching;Shooting;Stabbing;Tingling    Pain Type Neuropathic pain;Chronic pain    Pain Onset More  than a month ago    Pain Frequency Intermittent    Aggravating Factors  poor positioning, increased activity, brace wear    Pain Relieving Factors pain meds/patch, CBD creame/oral droplets                 [facility] Adult PT Treatment/Exercise - 01/09/20 1541      Transfers   Transfers Sit to Stand    Sit to Stand 5: Supervision;From chair/3-in-1;Without upper extremity assist;From bed    Stand to Sit 5: Supervision;With upper extremity assist;With armrests;To bed;To chair/3-in-1      Ambulation/Gait   Ambulation/Gait Yes    Ambulation/Gait Assistance 6: Modified independent (Device/Increase time);5: Supervision    Assistive device Rollator    Gait Pattern Step-through pattern;Narrow base of support;Lateral trunk lean to right    Ambulation Surface Level;Indoor      Exercises   Exercises Other Exercises    Other Exercises  With out Bioness: standing heel raises, then HS curls for 10 reps bil LE's. Added to HEP verbally, pt stated he did not need a new handout for these. concurrent with Bioness to bil LE's: seated at edge of mat- with hands on seat of chair in front of pt had pt "hover" hips over mat for on time, rest  with off times, then long arc quads for 15 reps each side individually. in standing: deep squats with UE support with on times, rest with off times for 15 reps, then alternating fwd mini lunges with on times, rest with off times for 5 reps each side. Needed to have intensity turned down on right thigh cuff with lunges due to too strong with "burning" sensation. skin intact at end of session with mild heat rash present.      Emergency planning/management officer bil ant tib and Architectural technologist for increased muscle activation and strengthening    Electrical Stimulation Parameters refer to tablet 1 for adjusted parameters; quick fit electrodes.    Electrical Stimulation Goals Strength;Neuromuscular facilitation                     PT Short Term Goals - 01/01/20 1533      PT SHORT TERM GOAL #1   Title Patient will be able to ambulate 115' with cane to improve short distance ambulation within home    Baseline 115' with min/mod A with quad cane due to multi LOB and scissoring 25% (10/13/19); 672' with cane and bilat GRAFOs (11/15/19)    Time 4    Period Weeks    Status Achieved    Target Date 10/18/19      PT SHORT TERM GOAL #2   Title Pt will demo <12 seconds on timed up and go with RW to improve functional mobility (target date 01/10/20)    Baseline 14 seconds RW (eval); 13 seconds rollator (10/13/19); 13.5 seconds SPC + bilat GRAFOs (11/15/19); 9.63 seconds with rollator and bil GRAFO    Time 8    Period Weeks    Status Achieved    Target Date 01/10/20      PT SHORT TERM GOAL #3   Title Patient will be able to go up and down a ramp and curb step to be able to negotiate community    Baseline CGA with rollator for 3' long ramp, forward and over aerobic stepper acting as curb x10 (10/13/19)    Time 4    Period Weeks    Status Achieved    Target Date 10/18/19             PT Long Term Goals - 01/01/20 1623      PT LONG TERM GOAL #1   Title Patient will demo >54/56 on berg balance scale to improve functional balance and reduce fall risk (Target date 01/10/20)    Baseline 40/56 (eval); 47/56 (11/15/19); 50/56 without GRAFO (01/01/20)    Time 6    Period Weeks    Status Revised    Target Date 02/12/20      PT LONG TERM GOAL #2   Title Patient will be able to ambulate >1100 feet with cane and with or without GRAFO to improve short distance community ambulation.    Baseline only walks with RW; 672' on 6 MWT with cane + GRAFO (11/15/19)    Time 6    Period Weeks    Status Revised    Target Date 02/12/20      PT LONG TERM GOAL #3   Title Patient will demo 4+/5 strength in bil LE grossly to improve functional strength with transfers (Target date 01/10/20)    Baseline 3+-4/5 grossly in bil LE     Time 6    Period Weeks    Status Revised  Target Date 02/12/20      PT LONG TERM GOAL #4   Title --    Baseline --    Time --    Period --    Status --      PT LONG TERM GOAL #5   Title --    Baseline --    Time --    Period --    Status --                 Plan - 01/09/20 1537    Clinical Impression Statement Today's skilled session continued to focus on LE strengthening concurrent with Bioness. Added in more functional ex's (squats, lunges) to work on balance/strengthening for other daily tasks as well (picking up objects, reaching for objects). Rest breaks needed due to muscle fatigue. The pt is progressing toward goals and should benefit from continued PT to progress toward unmet goals.    Personal Factors and Comorbidities Age;Past/Current Experience;Comorbidity 2;Time since onset of injury/illness/exacerbation;Transportation    Examination-Activity Limitations Bathing;Lift;Squat;Stairs;Stand;Transfers    Examination-Participation Restrictions Cleaning;Community Activity;Driving;Laundry;Occupation;Shop;Yard Work    Rehab Potential Fair    PT Frequency 2x / week    PT Duration 6 weeks    PT Treatment/Interventions ADLs/Self Care Home Management;Gait training;Stair training;Functional mobility training;Therapeutic activities;Therapeutic exercise;Balance training;Neuromuscular re-education;Manual techniques;Wheelchair mobility training;Patient/family education;Passive range of motion;Energy conservation;Vestibular;Visual/perceptual remediation/compensation;Joint Manipulations;Biofeedback;Electrical Stimulation    PT Next Visit Plan continue with Bioness to bil LE'S concurrent with strengthening, begin to incorporated balance activities such as picking up objects, low squats to reach the floor, single leg stance activities and tandem stance both with and without Bioness.    PT Home Exercise Plan Access Code: [email or web address]. plus heel raises, and HS curls verbally  added. Pt stated able to recall these without new handouts.    Consulted and Agree with Plan of Care Patient           Patient will benefit from skilled therapeutic intervention in order to improve the following deficits and impairments:  Abnormal gait,Decreased activity tolerance,Decreased balance,Decreased cognition,Decreased mobility,Decreased endurance,Decreased coordination,Decreased range of motion,Decreased strength,Dizziness,Difficulty walking,Impaired sensation,Impaired tone,Impaired UE functional use,Postural dysfunction,Pain  Visit Diagnosis: Muscle weakness (generalized)  Other abnormalities of gait and mobility  Unsteadiness on feet  Difficulty in walking, not elsewhere classified     Problem List Patient Active Problem List   Diagnosis Date Noted  . Incomplete paraplegia ([place]) 11/08/2019  . Foot drop, bilateral 11/08/2019  . Spasticity 08/18/2019  . Chronic pain syndrome 08/18/2019  . Nerve pain 08/18/2019  . Clonus 07/21/2019  . Spinal cord tumor   . Pain   . Transaminitis   . Leukocytosis   . Neurogenic bowel   . Postoperative pain   . 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 Acute incomplete quadriplegia ([facility])   . 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 Numbness 05/10/2019  . Weakness of left lower extremity 05/10/2019  . 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 Visual disturbance 05/10/2019    Willow Ora, PTA, [facility] Outpatient Neuro [facility] [address] [place], [place] [address] [phone] 01/09/20, 5:15 PM   Name: Jacob Lambert MRN: [id number] Date of Birth: March 31, 1985

## 2020-01-11 ENCOUNTER — Other Ambulatory Visit: Payer: Self-pay

## 2020-01-11 ENCOUNTER — Encounter: Payer: Self-pay | Admitting: Physical Therapy

## 2020-01-11 ENCOUNTER — Ambulatory Visit: Payer: BC Managed Care – PPO | Admitting: Physical Therapy

## 2020-01-11 DIAGNOSIS — R2681 Unsteadiness on feet: Secondary | ICD-10-CM | POA: Diagnosis not present

## 2020-01-11 DIAGNOSIS — M6281 Muscle weakness (generalized): Secondary | ICD-10-CM

## 2020-01-11 DIAGNOSIS — R262 Difficulty in walking, not elsewhere classified: Secondary | ICD-10-CM

## 2020-01-11 DIAGNOSIS — R2689 Other abnormalities of gait and mobility: Secondary | ICD-10-CM | POA: Diagnosis not present

## 2020-01-11 NOTE — Therapy (Signed)
[facility] [address] [place], [place], [address] Phone: [phone]   Fax:  [phone]  Physical Therapy Treatment  Patient Details  Name: Jacob Lambert MRN: [id number] Date of Birth: [birth year] Referring Provider (PT): Dr. Genice Rouge   Encounter Date: 01/11/2020   PT End of Session - 01/11/20 1541    Visit Number 30    Number of Visits 39    Date for PT Re-Evaluation 02/12/20    Authorization Type BCBS    PT Start Time 1533    PT Stop Time 1616    PT Time Calculation (min) 43 min    Equipment Utilized During Treatment Other (comment)   Bioness to bil LE's   Activity Tolerance Patient tolerated treatment well;No increased pain    Behavior During Therapy WFL for tasks assessed/performed           Past Medical History:  Diagnosis Date  . Anxiety   . Clonus   . Depression   . Headache   . Pneumonia    "walking"  . Quadriplegia and quadriparesis ([facility])   . Spasticity   . Spinal cord tumor   . Wheelchair bound     Past Surgical History:  Procedure Laterality Date  . LAMINECTOMY N/A 06/01/2019   Procedure: Cervical six-seven Laminoplasty, biopsy and resection of intramedullary spinal cord tumor;  Surgeon: Jadene Pierini, MD;  Location: [facility] OR;  Service: Neurosurgery;  Laterality: N/A;  . NO PAST SURGERIES      There were no vitals filed for this visit.   Subjective Assessment - 01/11/20 1537    Subjective Was very sore after last session "you hurt me". Legs were sore for a few days, better today.    Limitations Standing;Walking;House hold activities    How long can you sit comfortably? no issues    How long can you stand comfortably? <5 min    How long can you walk comfortably? <0.25 mile    Patient Stated Goals Progress walking    Currently in Pain? Yes    Pain Score 5     Pain Location Leg    Pain Orientation Right;Left    Pain Descriptors / Indicators Aching;Shooting;Stabbing;Tingling     Pain Type Chronic pain;Neuropathic pain    Pain Onset More than a month ago    Pain Frequency Intermittent    Aggravating Factors  increased activity, brace wear    Pain Relieving Factors pain meds/patch, CBC creame/oral droplets                 [facility] Adult PT Treatment/Exercise - 01/11/20 1542      Transfers   Transfers Sit to Stand;Stand to Sit    Sit to Stand 6: Modified independent (Device/Increase time)    Stand to Sit 6: Modified independent (Device/Increase time)      Ambulation/Gait   Ambulation/Gait Yes    Ambulation/Gait Assistance 6: Modified independent (Device/Increase time)    Ambulation/Gait Assistance Details around gym with session    Assistive device Rollator    Gait Pattern Step-through pattern;Narrow base of support;Lateral trunk lean to right    Ambulation Surface Level;Indoor      Exercises   Exercises Other Exercises    Other Exercises  concurrent with Bioness to bil LE's: seated at edge of mat with just thigh cuffs active- pt with hands on seat of chair in front on him while 'hovering"over the mat for on time of 7 sec's, rest between for ~15 reps. with  all cuffs active: then seated single leg at a time long arc quads for 12 reps each side with assisted DF as well; in standing- with UE support on sturdy surface for deep squats for 10 reps, progressing to deeper squats with pt reaching down to 2 inch box on floor in front for 10 reps. then with light UE support on sturdy surface- alternating lateral lunges for 5 reps on each side. cues needed for correct form/technique with min guard to min assist for balance at times.      Theme park manager bil ant tib and Engineer, building services for increased muscle activation and strengthening    Electrical Stimulation Parameters refer to tablet 1 for adjusted parameters; quick fit electrodes    Electrical Stimulation Goals Strength;Neuromuscular facilitation                     PT Short Term Goals - 01/01/20 1533      PT SHORT TERM GOAL #1   Title Patient will be able to ambulate 115' with cane to improve short distance ambulation within home    Baseline 115' with min/mod A with quad cane due to multi LOB and scissoring 25% (10/13/19); 672' with cane and bilat GRAFOs (11/15/19)    Time 4    Period Weeks    Status Achieved    Target Date 10/18/19      PT SHORT TERM GOAL #2   Title Pt will demo <12 seconds on timed up and go with RW to improve functional mobility (target date 01/10/20)    Baseline 14 seconds RW (eval); 13 seconds rollator (10/13/19); 13.5 seconds SPC + bilat GRAFOs (11/15/19); 9.63 seconds with rollator and bil GRAFO    Time 8    Period Weeks    Status Achieved    Target Date 01/10/20      PT SHORT TERM GOAL #3   Title Patient will be able to go up and down a ramp and curb step to be able to negotiate community    Baseline CGA with rollator for 3' long ramp, forward and over aerobic stepper acting as curb x10 (10/13/19)    Time 4    Period Weeks    Status Achieved    Target Date 10/18/19             PT Long Term Goals - 01/01/20 1623      PT LONG TERM GOAL #1   Title Patient will demo >54/56 on berg balance scale to improve functional balance and reduce fall risk (Target date 01/10/20)    Baseline 40/56 (eval); 47/56 (11/15/19); 50/56 without GRAFO (01/01/20)    Time 6    Period Weeks    Status Revised    Target Date 02/12/20      PT LONG TERM GOAL #2   Title Patient will be able to ambulate >1100 feet with cane and with or without GRAFO to improve short distance community ambulation.    Baseline only walks with RW; 672' on 6 MWT with cane + GRAFO (11/15/19)    Time 6    Period Weeks    Status Revised    Target Date 02/12/20      PT LONG TERM GOAL #3   Title Patient will demo 4+/5 strength in bil LE grossly to improve functional strength with transfers (Target date 01/10/20)    Baseline 3+-4/5 grossly in bil  LE    Time 6  Period Weeks    Status Revised    Target Date 02/12/20      PT LONG TERM GOAL #4   Title --    Baseline --    Time --    Period --    Status --      PT LONG TERM GOAL #5   Title --    Baseline --    Time --    Period --    Status --                 Plan - 01/11/20 1542    Clinical Impression Statement Today's skilled session continued to focus on use of Bioness to bil LE's concurrent with ex's with no burning or other issues reported/noted with session. Pt was able to tolerate advanced ex's today and standing ex with no UE support. The pt is progressing toward goals and should benefit from continued PT to progress toward unmet goals.    Personal Factors and Comorbidities Age;Past/Current Experience;Comorbidity 2;Time since onset of injury/illness/exacerbation;Transportation    Examination-Activity Limitations Bathing;Lift;Squat;Stairs;Stand;Transfers    Examination-Participation Restrictions Cleaning;Community Activity;Driving;Laundry;Occupation;Shop;Yard Work    Rehab Potential Fair    PT Frequency 2x / week    PT Duration 6 weeks    PT Treatment/Interventions ADLs/Self Care Home Management;Gait training;Stair training;Functional mobility training;Therapeutic activities;Therapeutic exercise;Balance training;Neuromuscular re-education;Manual techniques;Wheelchair mobility training;Patient/family education;Passive range of motion;Energy conservation;Vestibular;Visual/perceptual remediation/compensation;Joint Manipulations;Biofeedback;Electrical Stimulation    PT Next Visit Plan Check STG. continue with Bioness to bil LE'S concurrent with strengthening, begin to incorporated balance activities such as picking up objects, low squats to reach the floor, single leg stance activities and tandem stance both with and without Bioness.    PT Home Exercise Plan Access Code: [email or web address]. plus heel raises, and HS curls verbally added. Pt stated able to recall these  without new handouts.    Consulted and Agree with Plan of Care Patient           Patient will benefit from skilled therapeutic intervention in order to improve the following deficits and impairments:  Abnormal gait,Decreased activity tolerance,Decreased balance,Decreased cognition,Decreased mobility,Decreased endurance,Decreased coordination,Decreased range of motion,Decreased strength,Dizziness,Difficulty walking,Impaired sensation,Impaired tone,Impaired UE functional use,Postural dysfunction,Pain  Visit Diagnosis: Muscle weakness (generalized)  Other abnormalities of gait and mobility  Unsteadiness on feet  Difficulty in walking, not elsewhere classified     Problem List Patient Active Problem List   Diagnosis Date Noted  . Incomplete paraplegia ([place]) 11/08/2019  . Foot drop, bilateral 11/08/2019  . Spasticity 08/18/2019  . Chronic pain syndrome 08/18/2019  . Nerve pain 08/18/2019  . Clonus 07/21/2019  . Spinal cord tumor   . Pain   . Transaminitis   . Leukocytosis   . Neurogenic bowel   . Postoperative pain   . 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 Acute incomplete quadriplegia ([facility])   . 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 Numbness 05/10/2019  . Weakness of left lower extremity 05/10/2019  . 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 Visual disturbance 05/10/2019    Willow Ora, PTA, [facility] Outpatient Neuro [facility] [address] [place], [place] [address] [phone] 01/11/20, 5:00 PM   Name: Jacob Lambert MRN: [id number] Date of Birth: 22-May-1985

## 2020-01-16 ENCOUNTER — Ambulatory Visit: Payer: BC Managed Care – PPO | Admitting: Physical Therapy

## 2020-01-18 ENCOUNTER — Ambulatory Visit: Payer: BC Managed Care – PPO | Admitting: Physical Therapy

## 2020-01-19 ENCOUNTER — Ambulatory Visit: Payer: BC Managed Care – PPO | Admitting: Family Medicine

## 2020-01-19 ENCOUNTER — Ambulatory Visit: Payer: BC Managed Care – PPO | Admitting: Physical Therapy

## 2020-01-19 ENCOUNTER — Other Ambulatory Visit: Payer: Self-pay

## 2020-01-19 ENCOUNTER — Encounter: Payer: Self-pay | Admitting: Physical Therapy

## 2020-01-19 DIAGNOSIS — M6281 Muscle weakness (generalized): Secondary | ICD-10-CM

## 2020-01-19 DIAGNOSIS — R2689 Other abnormalities of gait and mobility: Secondary | ICD-10-CM

## 2020-01-19 DIAGNOSIS — R2681 Unsteadiness on feet: Secondary | ICD-10-CM | POA: Diagnosis not present

## 2020-01-19 DIAGNOSIS — R262 Difficulty in walking, not elsewhere classified: Secondary | ICD-10-CM

## 2020-01-19 NOTE — Therapy (Signed)
[facility] [address] [place], [place], [address] Phone: [phone]   Fax:  [phone]  Physical Therapy Treatment  Patient Details  Name: Jacob Lambert MRN: [id number] Date of Birth: 09-20-1985 Referring Provider (PT): Dr. Courtney Heys   Encounter Date: 01/19/2020   PT End of Session - 01/19/20 1614    Visit Number 31    Number of Visits 39    Date for PT Re-Evaluation 02/12/20    Authorization Type BCBS    PT Start Time [id number]    PT Stop Time 1230    PT Time Calculation (min) 45 min    Equipment Utilized During Treatment Other (comment)   Bioness to bil LE's   Activity Tolerance Patient tolerated treatment well;No increased pain    Behavior During Therapy WFL for tasks assessed/performed           Past Medical History:  Diagnosis Date  . Anxiety   . Clonus   . Depression   . Headache   . Pneumonia    "walking"  . Quadriplegia and quadriparesis ([place])   . Spasticity   . Spinal cord tumor   . Wheelchair bound     Past Surgical History:  Procedure Laterality Date  . LAMINECTOMY N/A 06/01/2019   Procedure: Cervical six-seven Laminoplasty, biopsy and resection of intramedullary spinal cord tumor;  Surgeon: [name], MD;  Location: [facility];  Service: Neurosurgery;  Laterality: N/A;  . NO PAST SURGERIES      There were no vitals filed for this visit.   Subjective Assessment - 01/19/20 1151    Subjective Using cane today; Has been attending a virtual conference.  Did go for a walk from coffee shop to tavern with cane outside; also did some shopping carrying bottles of wine while walking wtih cane.    Limitations Standing;Walking;House hold activities    How long can you sit comfortably? no issues    How long can you stand comfortably? <5 min    How long can you walk comfortably? <0.25 mile    Patient Stated Goals Progress walking    Currently in Pain? Yes    Pain Onset More than a month  ago                             [facility] Adult PT Treatment/Exercise - 01/19/20 1220      Ambulation/Gait   Ambulation/Gait Yes    Ambulation/Gait Assistance 5: Supervision    Ambulation/Gait Assistance Details with cane and Bioness; ambulated multiple times around gym on level surface with supervision while therapist adjusted gait settings on Bioness; as pt fatigued increased foot slap noted on R.  Pt is able to switch cane between L and RUE as UE fatigues    Ambulation Distance (Feet) 575 Feet    Assistive device Straight cane    Gait Pattern Step-through pattern;Narrow base of support   R foot slap   Ambulation Surface Level;Indoor    Stairs Yes    Stairs Assistance 5: Supervision    Stairs Assistance Details (indicate cue type and reason) performed with Bioness in gait mode; first performed with one rail and cane with step to sequence to ascend and descend.  Then performed with bilat rails ascending and descending wtih alternating sequence; no evidence of LE instability.    Stair Management Technique One rail Right;One rail Left;Two rails;Alternating pattern;Step to pattern;Forwards;Other (comment)   Bioness   Number  of Stairs 16    Height of Stairs 6    Ramp 5: Supervision    Ramp Details (indicate cue type and reason) with Bioness and cane; close supervision but pt demonstrated good step length and control of momentum when descending    Curb 5: Supervision    Curb Details (indicate cue type and reason) negotiated curb x 3 reps with cane and Bioness with pt using cane in R or LUE and safely leading with R or LLE; no instability noted in LE      Modalities   Modalities Electrical Stimulation      Electrical Stimulation   Electrical Stimulation Location bilat anterior tib and quads    Electrical Stimulation Action open and closed chain ankle DF and knee extension    Electrical Stimulation Parameters refer to tablet 1    Electrical Stimulation Goals  Strength;Neuromuscular facilitation                  PT Education - 01/19/20 1613    Education Details stair, ramp and curb training with cane and Bioness    Person(s) Educated Patient    Methods Explanation;Demonstration    Comprehension Verbalized understanding;Returned demonstration            PT Short Term Goals - 01/01/20 1533      PT SHORT TERM GOAL #1   Title Patient will be able to ambulate 115' with cane to improve short distance ambulation within home    Baseline 115' with min/mod A with quad cane due to multi LOB and scissoring 25% (10/13/19); 672' with cane and bilat GRAFOs (11/15/19)    Time 4    Period Weeks    Status Achieved    Target Date 10/18/19      PT SHORT TERM GOAL #2   Title Pt will demo <12 seconds on timed up and go with RW to improve functional mobility (target date 01/10/20)    Baseline 14 seconds RW (eval); 13 seconds rollator (10/13/19); 13.5 seconds SPC + bilat GRAFOs (11/15/19); 9.63 seconds with rollator and bil GRAFO    Time 8    Period Weeks    Status Achieved    Target Date 01/10/20      PT SHORT TERM GOAL #3   Title Patient will be able to go up and down a ramp and curb step to be able to negotiate community    Baseline CGA with rollator for 3' long ramp, forward and over aerobic stepper acting as curb x10 (10/13/19)    Time 4    Period Weeks    Status Achieved    Target Date 10/18/19             PT Long Term Goals - 01/01/20 1623      PT LONG TERM GOAL #1   Title Patient will demo >54/56 on berg balance scale to improve functional balance and reduce fall risk (Target date 01/10/20)    Baseline 40/56 (eval); 47/56 (11/15/19); 50/56 without GRAFO (01/01/20)    Time 6    Period Weeks    Status Revised    Target Date 02/12/20      PT LONG TERM GOAL #2   Title Patient will be able to ambulate >1100 feet with cane and with or without GRAFO to improve short distance community ambulation.    Baseline only walks with RW; 672' on 6  MWT with cane + GRAFO (11/15/19)    Time 6    Period Weeks  Status Revised    Target Date 02/12/20      PT LONG TERM GOAL #3   Title Patient will demo 4+/5 strength in bil LE grossly to improve functional strength with transfers (Target date 01/10/20)    Baseline 3+-4/5 grossly in bil LE    Time 6    Period Weeks    Status Revised    Target Date 02/12/20      PT LONG TERM GOAL #4   Title --    Baseline --    Time --    Period --    Status --      PT LONG TERM GOAL #5   Title --    Baseline --    Time --    Period --    Status --                 Plan - 01/19/20 1620    Clinical Impression Statement Continued to perform training with bilat functional electrical stimulation with Bioness; progressed gait training with Bioness to straight cane over level surfaces, stairs, ramp and curb.  Pt able to perform all gait tasks with close supervision with no evidence of LE instability.  Pt is demonstrating improved strength and balance and has begun to utilize the cane out in the community.  Will continue to progress towards LTG.    Personal Factors and Comorbidities Age;Past/Current Experience;Comorbidity 2;Time since onset of injury/illness/exacerbation;Transportation    Examination-Activity Limitations Bathing;Lift;Squat;Stairs;Stand;Transfers    Examination-Participation Restrictions Cleaning;Community Activity;Driving;Laundry;Occupation;Shop;Yard Work    Rehab Potential Fair    PT Frequency 2x / week    PT Duration 6 weeks    PT Treatment/Interventions ADLs/Self Care Home Management;Gait training;Stair training;Functional mobility training;Therapeutic activities;Therapeutic exercise;Balance training;Neuromuscular re-education;Manual techniques;Wheelchair mobility training;Patient/family education;Passive range of motion;Energy conservation;Vestibular;Visual/perceptual remediation/compensation;Joint Manipulations;Biofeedback;Electrical Stimulation    PT Next Visit Plan continue  with Bioness to bil LE'S concurrent with strengthening, begin to incorporated balance activities such as picking up objects, low squats to reach the floor, single leg stance activities and tandem stance both with and without Bioness.  Gait outside as weather allows, stairs alternating sequence, walking holding objects.    PT Home Exercise Plan Access Code: [email or web address]; [email or web address]. plus heel raises, and HS curls verbally added. Pt stated able to recall these without new handouts.    Consulted and Agree with Plan of Care Patient           Patient will benefit from skilled therapeutic intervention in order to improve the following deficits and impairments:  Abnormal gait,Decreased activity tolerance,Decreased balance,Decreased cognition,Decreased mobility,Decreased endurance,Decreased coordination,Decreased range of motion,Decreased strength,Dizziness,Difficulty walking,Impaired sensation,Impaired tone,Impaired UE functional use,Postural dysfunction,Pain  Visit Diagnosis: Muscle weakness (generalized)  Other abnormalities of gait and mobility  Unsteadiness on feet  Difficulty in walking, not elsewhere classified     Problem List Patient Active Problem List   Diagnosis Date Noted  . Incomplete paraplegia ([place]) 11/08/2019  . Foot drop, bilateral 11/08/2019  . Spasticity 08/18/2019  . Chronic pain syndrome 08/18/2019  . Nerve pain 08/18/2019  . Clonus 07/21/2019  . Spinal cord tumor   . Pain   . Transaminitis   . Leukocytosis   . Neurogenic bowel   . Postoperative pain   . 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 Acute incomplete quadriplegia ([facility])   . 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 Numbness 05/10/2019  . Weakness of left lower extremity 05/10/2019  . 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 Visual disturbance 05/10/2019    Rico Junker, PT, DPT 01/19/20  4:25 PM    [facility] [address] [place], [place],  [address] Phone: [phone]   Fax:  [phone]  Name: Jacob Lambert MRN: [id number] Date of Birth: 20-Jan-1985

## 2020-01-22 ENCOUNTER — Ambulatory Visit: Payer: BC Managed Care – PPO | Admitting: Physical Therapy

## 2020-01-24 ENCOUNTER — Other Ambulatory Visit: Payer: Self-pay

## 2020-01-24 ENCOUNTER — Encounter: Payer: Self-pay | Admitting: Physical Therapy

## 2020-01-24 ENCOUNTER — Ambulatory Visit: Payer: BC Managed Care – PPO | Admitting: Physical Therapy

## 2020-01-24 DIAGNOSIS — R2689 Other abnormalities of gait and mobility: Secondary | ICD-10-CM | POA: Diagnosis not present

## 2020-01-24 DIAGNOSIS — M6281 Muscle weakness (generalized): Secondary | ICD-10-CM | POA: Diagnosis not present

## 2020-01-24 DIAGNOSIS — R262 Difficulty in walking, not elsewhere classified: Secondary | ICD-10-CM | POA: Diagnosis not present

## 2020-01-24 DIAGNOSIS — R2681 Unsteadiness on feet: Secondary | ICD-10-CM | POA: Diagnosis not present

## 2020-01-25 ENCOUNTER — Ambulatory Visit: Payer: BC Managed Care – PPO | Admitting: Physical Therapy

## 2020-01-25 ENCOUNTER — Encounter: Payer: Self-pay | Admitting: Physical Therapy

## 2020-01-25 DIAGNOSIS — R262 Difficulty in walking, not elsewhere classified: Secondary | ICD-10-CM | POA: Diagnosis not present

## 2020-01-25 DIAGNOSIS — R2689 Other abnormalities of gait and mobility: Secondary | ICD-10-CM

## 2020-01-25 DIAGNOSIS — R2681 Unsteadiness on feet: Secondary | ICD-10-CM | POA: Diagnosis not present

## 2020-01-25 DIAGNOSIS — M6281 Muscle weakness (generalized): Secondary | ICD-10-CM | POA: Diagnosis not present

## 2020-01-25 NOTE — Therapy (Signed)
[facility] [address] [place], [place], [address] Phone: [phone]   Fax:  [phone]  Physical Therapy Treatment  Patient Details  Name: Jacob Lambert MRN: [id number] Date of Birth: 1985-02-14 Referring Provider (PT): Dr. Courtney Heys   Encounter Date: 01/24/2020   PT End of Session - 01/24/20 1537    Visit Number 32    Number of Visits 39    Date for PT Re-Evaluation 02/12/20    Authorization Type BCBS    PT Start Time 1532    PT Stop Time 1617    PT Time Calculation (min) 45 min    Equipment Utilized During Treatment Other (comment)   Bioness to bil LE's with ace wraps over thight components   Activity Tolerance Patient tolerated treatment well;No increased pain    Behavior During Therapy WFL for tasks assessed/performed           Past Medical History:  Diagnosis Date  . Anxiety   . Clonus   . Depression   . Headache   . Pneumonia    "walking"  . Quadriplegia and quadriparesis ([place])   . Spasticity   . Spinal cord tumor   . Wheelchair bound     Past Surgical History:  Procedure Laterality Date  . LAMINECTOMY N/A 06/01/2019   Procedure: Cervical six-seven Laminoplasty, biopsy and resection of intramedullary spinal cord tumor;  Surgeon: [name], MD;  Location: [facility];  Service: Neurosurgery;  Laterality: N/A;  . NO PAST SURGERIES      There were no vitals filed for this visit.   Subjective Assessment - 01/24/20 1534    Subjective No new complaints. No falls. Reports his clonus was very active this morning, a little bit better now.    Limitations Standing;Walking;House hold activities    How long can you sit comfortably? no issues    How long can you stand comfortably? <5 min    How long can you walk comfortably? <0.25 mile    Patient Stated Goals Progress walking    Currently in Pain? Yes    Pain Score 6     Pain Location Leg    Pain Orientation Right;Left    Pain  Descriptors / Indicators Aching;Shooting;Stabbing;Tingling    Pain Type Chronic pain;Neuropathic pain    Pain Onset More than a month ago    Pain Frequency Intermittent    Aggravating Factors  increased activity, brace wear    Pain Relieving Factors pain meds/patch, CBD cream/oral droplets                  [facility] Adult PT Treatment/Exercise - 01/24/20 1539      Transfers   Transfers Sit to Stand;Stand to Sit    Sit to Stand 6: Modified independent (Device/Increase time)    Stand to Sit 6: Modified independent (Device/Increase time)      Ambulation/Gait   Ambulation/Gait Yes    Ambulation/Gait Assistance 5: Supervision;4: Min guard    Ambulation/Gait Assistance Details with cane/bil Bioness. cues for posture and cane placement. minor veering at times with no significant balance loss noted.    Ambulation Distance (Feet) 115 Feet   x2,   Assistive device Straight cane   with rubber quad tip   Gait Pattern Step-through pattern;Narrow base of support    Ambulation Surface Level;Indoor      High Level Balance   High Level Balance Activities Negotiating over obstacles;Marching forwards;Backward walking;Side stepping    High Level Balance Comments  with Bioness to bil LE's/cane: forward stepping over bolsters x 6 laps, min guard assist; with side stepping, backward walking/forward marching with cane assist next to counter for 3 laps each/each way with cues on posture, weight shifitng and cane placement with min guard assist for safety.      Acupuncturist Location bilat anterior tib and quads    Electrical Stimulation Action open and closed chain strengthening, increased muscle activation    Electrical Stimulation Parameters refer to tablet 1 for adjusted parameters; quick fit electrodes    Electrical Stimulation Goals Strength;Neuromuscular facilitation               PT Short Term Goals - 01/01/20 1533      PT SHORT TERM GOAL #1   Title  Patient will be able to ambulate 115' with cane to improve short distance ambulation within home    Baseline 115' with min/mod A with quad cane due to multi LOB and scissoring 25% (10/13/19); 672' with cane and bilat GRAFOs (11/15/19)    Time 4    Period Weeks    Status Achieved    Target Date 10/18/19      PT SHORT TERM GOAL #2   Title Pt will demo <12 seconds on timed up and go with RW to improve functional mobility (target date 01/10/20)    Baseline 14 seconds RW (eval); 13 seconds rollator (10/13/19); 13.5 seconds SPC + bilat GRAFOs (11/15/19); 9.63 seconds with rollator and bil GRAFO    Time 8    Period Weeks    Status Achieved    Target Date 01/10/20      PT SHORT TERM GOAL #3   Title Patient will be able to go up and down a ramp and curb step to be able to negotiate community    Baseline CGA with rollator for 3' long ramp, forward and over aerobic stepper acting as curb x10 (10/13/19)    Time 4    Period Weeks    Status Achieved    Target Date 10/18/19             PT Long Term Goals - 01/01/20 1623      PT LONG TERM GOAL #1   Title Patient will demo >54/56 on berg balance scale to improve functional balance and reduce fall risk (Target date 01/10/20)    Baseline 40/56 (eval); 47/56 (11/15/19); 50/56 without GRAFO (01/01/20)    Time 6    Period Weeks    Status Revised    Target Date 02/12/20      PT LONG TERM GOAL #2   Title Patient will be able to ambulate >1100 feet with cane and with or without GRAFO to improve short distance community ambulation.    Baseline only walks with RW; 672' on 6 MWT with cane + GRAFO (11/15/19)    Time 6    Period Weeks    Status Revised    Target Date 02/12/20      PT LONG TERM GOAL #3   Title Patient will demo 4+/5 strength in bil LE grossly to improve functional strength with transfers (Target date 01/10/20)    Baseline 3+-4/5 grossly in bil LE    Time 6    Period Weeks    Status Revised    Target Date 02/12/20      PT LONG TERM GOAL  #4   Title --    Baseline --    Time --    Period --  Status --      PT LONG TERM GOAL #5   Title --    Baseline --    Time --    Period --    Status --                 Plan - 01/24/20 1537    Clinical Impression Statement Today's skilled session continued with use of bil Bioness with gait/balance while using the cane with no significant issues noted or reported. With use of ace wraps both thigh units stayed in place this session. The pt is progressing and should benefit from continued PT to progress toward unmet goals.    Personal Factors and Comorbidities Age;Past/Current Experience;Comorbidity 2;Time since onset of injury/illness/exacerbation;Transportation    Examination-Activity Limitations Bathing;Lift;Squat;Stairs;Stand;Transfers    Examination-Participation Restrictions Cleaning;Community Activity;Driving;Laundry;Occupation;Shop;Yard Work    Rehab Potential Fair    PT Frequency 2x / week    PT Duration 6 weeks    PT Treatment/Interventions ADLs/Self Care Home Management;Gait training;Stair training;Functional mobility training;Therapeutic activities;Therapeutic exercise;Balance training;Neuromuscular re-education;Manual techniques;Wheelchair mobility training;Patient/family education;Passive range of motion;Energy conservation;Vestibular;Visual/perceptual remediation/compensation;Joint Manipulations;Biofeedback;Electrical Stimulation    PT Next Visit Plan continue with Bioness to bil LE'S concurrent with strengthening, begin to incorporated balance activities such as picking up objects, low squats to reach the floor, single leg stance activities and tandem stance both with and without Bioness.  Gait outside as weather allows, stairs alternating sequence, walking holding objects.    PT Home Exercise Plan Access Code: [email or web address]. plus heel raises, and HS curls verbally added. Pt stated able to recall these without new handouts.    Consulted and Agree with Plan of  Care Patient           Patient will benefit from skilled therapeutic intervention in order to improve the following deficits and impairments:  Abnormal gait,Decreased activity tolerance,Decreased balance,Decreased cognition,Decreased mobility,Decreased endurance,Decreased coordination,Decreased range of motion,Decreased strength,Dizziness,Difficulty walking,Impaired sensation,Impaired tone,Impaired UE functional use,Postural dysfunction,Pain  Visit Diagnosis: Muscle weakness (generalized)  Other abnormalities of gait and mobility  Unsteadiness on feet  Difficulty in walking, not elsewhere classified     Problem List Patient Active Problem List   Diagnosis Date Noted  . Incomplete paraplegia ([place]) 11/08/2019  . Foot drop, bilateral 11/08/2019  . Spasticity 08/18/2019  . Chronic pain syndrome 08/18/2019  . Nerve pain 08/18/2019  . Clonus 07/21/2019  . Spinal cord tumor   . Pain   . Transaminitis   . Leukocytosis   . Neurogenic bowel   . Postoperative pain   . 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 Acute incomplete quadriplegia ([facility])   . 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 Numbness 05/10/2019  . Weakness of left lower extremity 05/10/2019  . 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 Visual disturbance 05/10/2019    Willow Ora, PTA, [facility] [address] [place], [place] [address] [phone] 01/25/20, 9:41 AM   Name: Jacob Lambert MRN: [id number] Date of Birth: November 16, 1985

## 2020-01-26 ENCOUNTER — Telehealth (INDEPENDENT_AMBULATORY_CARE_PROVIDER_SITE_OTHER): Payer: BC Managed Care – PPO | Admitting: Family Medicine

## 2020-01-26 ENCOUNTER — Encounter: Payer: Self-pay | Admitting: Family Medicine

## 2020-01-26 VITALS — Ht 70.0 in | Wt 144.0 lb

## 2020-01-26 DIAGNOSIS — G8250 Quadriplegia, unspecified: Secondary | ICD-10-CM | POA: Diagnosis not present

## 2020-01-26 DIAGNOSIS — C720 Malignant neoplasm of spinal cord: Secondary | ICD-10-CM | POA: Diagnosis not present

## 2020-01-26 DIAGNOSIS — K592 Neurogenic bowel, not elsewhere classified: Secondary | ICD-10-CM

## 2020-01-26 DIAGNOSIS — D497 Neoplasm of unspecified behavior of endocrine glands and other parts of nervous system: Secondary | ICD-10-CM | POA: Diagnosis not present

## 2020-01-26 DIAGNOSIS — Z09 Encounter for follow-up examination after completed treatment for conditions other than malignant neoplasm: Secondary | ICD-10-CM

## 2020-01-26 DIAGNOSIS — R258 Other abnormal involuntary movements: Secondary | ICD-10-CM

## 2020-01-26 DIAGNOSIS — R52 Pain, unspecified: Secondary | ICD-10-CM

## 2020-01-26 MED ORDER — DIAZEPAM 10 MG PO TABS: 10.0000 mg | ORAL_TABLET | Freq: Four times a day (QID) | ORAL | 0 refills | Status: AC | PRN

## 2020-01-26 MED ORDER — CITALOPRAM HYDROBROMIDE 40 MG PO TABS: 40.0000 mg | ORAL_TABLET | Freq: Every day | ORAL | 3 refills | Status: AC

## 2020-01-26 MED ORDER — SILDENAFIL CITRATE 50 MG PO TABS: 50.0000 mg | ORAL_TABLET | ORAL | 3 refills | Status: AC | PRN

## 2020-01-26 NOTE — Progress Notes (Signed)
Virtual Visit via Telephone Note  I connected with Jacob Lambert on 84/13/24 at 40:10 AM EST by telephone and verified that I am speaking with the correct person using two identifiers.  Location: Patient: Home Provider: Office   I discussed the limitations, risks, security and privacy concerns of performing an evaluation and management service by telephone and the availability of in person appointments. I also discussed with the patient that there may be a patient responsible charge related to this service. The patient expressed understanding and agreed to proceed.   History of Present Illness:   Patient Active Problem List   Diagnosis Date Noted  . Incomplete paraplegia ([place]) 11/08/2019  . Foot drop, bilateral 11/08/2019  . Spasticity 08/18/2019  . Chronic pain syndrome 08/18/2019  . Nerve pain 08/18/2019  . Clonus 07/21/2019  . Spinal cord tumor   . Pain   . Transaminitis   . Leukocytosis   . Neurogenic bowel   . Postoperative pain   . 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 Acute incomplete quadriplegia (HCC)   . 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 Numbness 05/10/2019  . Weakness of left lower extremity 05/10/2019  . 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 Visual disturbance 05/10/2019   Current Status: Since his last office visit, he continues to be followed by Oncology, Dr. Tammi Klippel for Cervical Spinal Tumor. He has completed 6 weeks of Radiation Treatment.  He states that his is now able to walk short distances with assistance of AFO braces, which is taking Baclofen and Dantrium. He has c/o chronic back pain and leg pain, which he currently used oral CBD and pain creams. Plan for next MRI in 03/2020. He uses power wheelchair for ambulation. He denies fevers, chills, fatigue, recent infections, weight loss, and night sweats. He has not had any headaches, visual changes, dizziness, and falls. No chest pain, heart palpitations, cough and shortness of breath reported. He reports that his  bowel movements are fairly normal consistancy. Denies GI problems such as vomiting, diarrhea, and constipation. He has no reports of blood in stools, dysuria and hematuria. No depression or anxiety reported today. He is taking all medications as prescribed. He denies pain today.    Observations/Objective:  Telephone Virtual Visit   Assessment and Plan:  1. Cervical spinal cord ependymoma (HCC)  2. Quadriplegia and quadriparesis ([place])  3. Spinal cord tumor  4. Neurogenic bowel  5. Pain Stable.   6. Clonus  7. Follow up He will follow up as needed.   No orders of the defined types were placed in this encounter.   No orders of the defined types were placed in this encounter.   Referral Orders  No referral(s) requested today    Kathe Becton, MSN, ANE, [name]-BC [facility] [address] [place], [place] [address] [phone]- fax     I discussed the assessment and treatment plan with the patient. The patient was provided an opportunity to ask questions and all were answered. The patient agreed with the plan and demonstrated an understanding of the instructions.   The patient was advised to call back or seek an in-person evaluation if the symptoms worsen or if the condition fails to improve as anticipated.  I provided 20 minutes of non-face-to-face time during this encounter.   [name], [name]

## 2020-01-26 NOTE — Therapy (Signed)
[facility] [address] [place], [place], [address] Phone: [phone]   Fax:  [phone]  Physical Therapy Treatment  Patient Details  Name: Jacob Lambert MRN: [id number] Date of Birth: 02/25/1985 Referring Provider (PT): Dr. Courtney Heys   Encounter Date: 01/25/2020   PT End of Session - 01/25/20 1540    Visit Number 33    Number of Visits 39    Date for PT Re-Evaluation 02/12/20    Authorization Type BCBS    PT Start Time 1532    PT Stop Time 1615    PT Time Calculation (min) 43 min    Equipment Utilized During Treatment Other (comment)   Bioness to bil LE's with ace wraps over thight components   Activity Tolerance Patient tolerated treatment well;No increased pain    Behavior During Therapy WFL for tasks assessed/performed           Past Medical History:  Diagnosis Date   Anxiety    Clonus    Depression    Headache    Pneumonia    "walking"   Quadriplegia and quadriparesis ([place])    Spasticity    Spinal cord tumor    Wheelchair bound     Past Surgical History:  Procedure Laterality Date   LAMINECTOMY N/A 06/01/2019   Procedure: Cervical six-seven Laminoplasty, biopsy and resection of intramedullary spinal cord tumor;  Surgeon: [name], MD;  Location: [facility];  Service: Neurosurgery;  Laterality: N/A;   NO PAST SURGERIES      There were no vitals filed for this visit.   Subjective Assessment - 01/25/20 1535    Subjective Was a little sore after yesterday. Also noted some "pulsating" movements at times, "like the muscles are coming back". Does report going "very low key" knowing he was coming to therapy a second day in a row.    Limitations Standing;Walking;House hold activities    How long can you sit comfortably? no issues    How long can you stand comfortably? <5 min    How long can you walk comfortably? <0.25 mile    Patient Stated Goals Progress walking    Currently  in Pain? Yes    Pain Score 6     Pain Location Leg    Pain Orientation Right;Left    Pain Descriptors / Indicators Aching;Shooting;Stabbing;Tingling    Pain Type Chronic pain;Neuropathic pain    Pain Onset More than a month ago    Pain Frequency Intermittent    Aggravating Factors  increased activity, brace wear    Pain Relieving Factors pain meds/patch, CBD cream/oral droplets                   [facility] Adult PT Treatment/Exercise - 01/25/20 1543      Transfers   Transfers Sit to Stand;Stand to Sit    Sit to Stand 6: Modified independent (Device/Increase time)    Stand to Sit 6: Modified independent (Device/Increase time)      Ambulation/Gait   Ambulation/Gait Yes    Ambulation/Gait Assistance 5: Supervision;4: Min guard    Ambulation/Gait Assistance Details with cane/bioness to bil LE's: cues for increased base of support at times cane placment. progressed to having pt scan enviroment in random directions with gait.    Ambulation Distance (Feet) 230 Feet   x1,   Assistive device Straight cane   with rubber quad tip   Gait Pattern Step-through pattern;Narrow base of support    Ambulation Surface Level;Indoor  Stairs Yes    Stairs Assistance 5: Supervision;4: Min guard    Stairs Assistance Details (indicate cue type and reason) 1st rep with right rail/cane with step to pattern, then 2cd rep with left rail/cane step to pattern, both with supervision. next 3 reps with bil rails using reciprocal pattern to work on strengthening, technique and activity tolerance.    Stair Management Technique One rail Right;One rail Left;Two rails;Alternating pattern;Step to pattern;Forwards;With cane    Number of Stairs 4   x 5 reps   Height of Stairs 6    Ramp 5: Supervision;Other (comment)   min guard assist for safety at times   Ramp Details (indicate cue type and reason) with Bioness/cane x 3 reps working on technique and activity tolerance.      Self-Care   Self-Care Other Self-Care  Comments    Other Self-Care Comments  dicussed curretn plan/cert/goals and pt goals to continue to work on. Pt want to continue to work on gait with cane for independance with short distances in the community, and longer distances with rollator as he has to sometimes walk a mile or so between venues when traveling for work.                    PT Short Term Goals - 01/01/20 1533      PT SHORT TERM GOAL #1   Title Patient will be able to ambulate 115' with cane to improve short distance ambulation within home    Baseline 115' with min/mod A with quad cane due to multi LOB and scissoring 25% (10/13/19); 672' with cane and bilat GRAFOs (11/15/19)    Time 4    Period Weeks    Status Achieved    Target Date 10/18/19      PT SHORT TERM GOAL #2   Title Pt will demo <12 seconds on timed up and go with RW to improve functional mobility (target date 01/10/20)    Baseline 14 seconds RW (eval); 13 seconds rollator (10/13/19); 13.5 seconds SPC + bilat GRAFOs (11/15/19); 9.63 seconds with rollator and bil GRAFO    Time 8    Period Weeks    Status Achieved    Target Date 01/10/20      PT SHORT TERM GOAL #3   Title Patient will be able to go up and down a ramp and curb step to be able to negotiate community    Baseline CGA with rollator for 3' long ramp, forward and over aerobic stepper acting as curb x10 (10/13/19)    Time 4    Period Weeks    Status Achieved    Target Date 10/18/19             PT Long Term Goals - 01/01/20 1623      PT LONG TERM GOAL #1   Title Patient will demo >54/56 on berg balance scale to improve functional balance and reduce fall risk (Target date 01/10/20)    Baseline 40/56 (eval); 47/56 (11/15/19); 50/56 without GRAFO (01/01/20)    Time 6    Period Weeks    Status Revised    Target Date 02/12/20      PT LONG TERM GOAL #2   Title Patient will be able to ambulate >1100 feet with cane and with or without GRAFO to improve short distance community ambulation.     Baseline only walks with RW; 672' on 6 MWT with cane + GRAFO (11/15/19)    Time 6  Period Weeks    Status Revised    Target Date 02/12/20      PT LONG TERM GOAL #3   Title Patient will demo 4+/5 strength in bil LE grossly to improve functional strength with transfers (Target date 01/10/20)    Baseline 3+-4/5 grossly in bil LE    Time 6    Period Weeks    Status Revised    Target Date 02/12/20      PT LONG TERM GOAL #4   Title --    Baseline --    Time --    Period --    Status --      PT LONG TERM GOAL #5   Title --    Baseline --    Time --    Period --    Status --                 Plan - 01/25/20 1540    Clinical Impression Statement Today's skilled session continued with use of Bioness to bil LE"s with gait/barriers to work on [place] and [place]. No issues noted or reported. Also began a discussion on current progress and pt goals for continued PT as his cert is coming up. Primary PT see's pt next week to work on [place] and updated goals.    Personal Factors and Comorbidities Age;Past/Current Experience;Comorbidity 2;Time since onset of injury/illness/exacerbation;Transportation    Examination-Activity Limitations Bathing;Lift;Squat;Stairs;Stand;Transfers    Examination-Participation Restrictions Cleaning;Community Activity;Driving;Laundry;Occupation;Shop;Yard Work    Rehab Potential Fair    PT Frequency 2x / week    PT Duration 6 weeks    PT Treatment/Interventions ADLs/Self Care Home Management;Gait training;Stair training;Functional mobility training;Therapeutic activities;Therapeutic exercise;Balance training;Neuromuscular re-education;Manual techniques;Wheelchair mobility training;Patient/family education;Passive range of motion;Energy conservation;Vestibular;Visual/perceptual remediation/compensation;Joint Manipulations;Biofeedback;Electrical Stimulation    PT Next Visit Plan continue with Bioness to bil LE'S concurrent with strengthening, begin to  incorporated balance activities such as picking up objects, low squats to reach the floor, single leg stance activities and tandem stance both with and without Bioness.  Gait outside as weather allows, stairs alternating sequence, walking holding objects.    PT Home Exercise Plan Access Code: [email or web address]; [email or web address]. plus heel raises, and HS curls verbally added. Pt stated able to recall these without new handouts.    Consulted and Agree with Plan of Care Patient           Patient will benefit from skilled therapeutic intervention in order to improve the following deficits and impairments:  Abnormal gait,Decreased activity tolerance,Decreased balance,Decreased cognition,Decreased mobility,Decreased endurance,Decreased coordination,Decreased range of motion,Decreased strength,Dizziness,Difficulty walking,Impaired sensation,Impaired tone,Impaired UE functional use,Postural dysfunction,Pain  Visit Diagnosis: Muscle weakness (generalized)  Other abnormalities of gait and mobility  Unsteadiness on feet  Difficulty in walking, not elsewhere classified     Problem List Patient Active Problem List   Diagnosis Date Noted   Incomplete paraplegia ([place]) 11/08/2019   Foot drop, bilateral 11/08/2019   Spasticity 08/18/2019   Chronic pain syndrome 08/18/2019   Nerve pain 08/18/2019   Clonus 07/21/2019   Spinal cord tumor    Pain    Transaminitis    Leukocytosis    Neurogenic bowel    Postoperative pain    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

## 2020-01-28 DIAGNOSIS — G8250 Quadriplegia, unspecified: Secondary | ICD-10-CM | POA: Diagnosis not present

## 2020-01-29 ENCOUNTER — Encounter: Payer: Self-pay | Admitting: Physical Therapy

## 2020-01-29 ENCOUNTER — Other Ambulatory Visit: Payer: Self-pay

## 2020-01-29 ENCOUNTER — Ambulatory Visit: Payer: BC Managed Care – PPO | Admitting: Physical Therapy

## 2020-01-29 DIAGNOSIS — R2689 Other abnormalities of gait and mobility: Secondary | ICD-10-CM

## 2020-01-29 DIAGNOSIS — R262 Difficulty in walking, not elsewhere classified: Secondary | ICD-10-CM

## 2020-01-29 DIAGNOSIS — R2681 Unsteadiness on feet: Secondary | ICD-10-CM | POA: Diagnosis not present

## 2020-01-29 DIAGNOSIS — M6281 Muscle weakness (generalized): Secondary | ICD-10-CM | POA: Diagnosis not present

## 2020-01-29 NOTE — Therapy (Signed)
[facility] [address] [place], [place], [address] Phone: [phone]   Fax:  [phone]  Physical Therapy Treatment  Patient Details  Name: Jacob Lambert MRN: [id number] Date of Birth: [birth year] Referring Provider (PT): Dr. Courtney Heys   Encounter Date: 01/29/2020   PT End of Session - 01/29/20 2121    Visit Number 34    Number of Visits 39    Date for PT Re-Evaluation 02/12/20    Authorization Type BCBS - VL: 30 PT/OT combined    Authorization - Visit Number 6    Authorization - Number of Visits 30    PT Start Time 1443    PT Stop Time 1630    PT Time Calculation (min) 45 min    Equipment Utilized During Treatment Other (comment)   Bioness to bil LE's with ace wraps over thight components   Activity Tolerance Patient tolerated treatment well    Behavior During Therapy [facility] for tasks assessed/performed           Past Medical History:  Diagnosis Date  . Anxiety   . Clonus   . Depression   . Headache   . Pneumonia    "walking"  . Quadriplegia and quadriparesis ([place])   . Spasticity   . Spinal cord tumor   . Wheelchair bound     Past Surgical History:  Procedure Laterality Date  . LAMINECTOMY N/A 06/01/2019   Procedure: Cervical six-seven Laminoplasty, biopsy and resection of intramedullary spinal cord tumor;  Surgeon: [name], MD;  Location: [facility];  Service: Neurosurgery;  Laterality: N/A;  . NO PAST SURGERIES      There were no vitals filed for this visit.   Subjective Assessment - 01/29/20 1546    Subjective Avoided the weather this weekend.  Just the regular soreness in the legs today.    Limitations Standing;Walking;House hold activities    How long can you sit comfortably? no issues    How long can you stand comfortably? <5 min    How long can you walk comfortably? <0.25 mile    Patient Stated Goals Progress walking    Currently in Pain? Yes    Pain Onset More than a month  ago                             [facility] Adult PT Treatment/Exercise - 01/29/20 1619      Ambulation/Gait   Ambulation/Gait Yes    Ambulation/Gait Assistance 4: Min assist    Ambulation/Gait Assistance Details with cane and Bioness in gait mode: dual tasking of walking with cane and performing toss/catch of hacky sack in R or LUE (pt changes hands with cane halfway around lap).    Ambulation Distance (Feet) 230 Feet    Assistive device Straight cane    Gait Pattern Step-through pattern;Narrow base of support;Right foot flat    Ambulation Surface Level;Indoor      High Level Balance   High Level Balance Activities Negotiating over obstacles;Marching forwards;Backward walking;Side stepping    High Level Balance Comments With Bioness in gait mode with straight cane; performed first set of obstacles focusing on single task.  Then added dual tasking of stepping over obstacles with cane and hacky sack toss/catch.  Min A required to negotiate obstacles safely when performing dual tasking due to decreased attention to step length, timing of stepping over and placement of cane.      Knee/Hip  Exercises: Aerobic   Tread Mill 1.2 mph x 8 minutes with bilat Bioness in gait mode and bilat UE support with focus on endurance, upright posture and full step length and stance time each LE.      Modalities   Modalities Teacher, English as a foreign language Location bilat anterior tib and quads    Electrical Stimulation Action open and closed chain ankle DF and knee extension    Electrical Stimulation Parameters refer to tablet 1 for adjusted parameters; quick fit electrodes; ACE wraps around thigh cuffs to maintain placement    Electrical Stimulation Goals Strength;Neuromuscular facilitation                    PT Short Term Goals - 01/01/20 1533      PT SHORT TERM GOAL #1   Title Patient will be able to ambulate 115' with cane to improve  short distance ambulation within home    Baseline 115' with min/mod A with quad cane due to multi LOB and scissoring 25% (10/13/19); 672' with cane and bilat GRAFOs (11/15/19)    Time 4    Period Weeks    Status Achieved    Target Date 10/18/19      PT SHORT TERM GOAL #2   Title Pt will demo <12 seconds on timed up and go with RW to improve functional mobility (target date 01/10/20)    Baseline 14 seconds RW (eval); 13 seconds rollator (10/13/19); 13.5 seconds SPC + bilat GRAFOs (11/15/19); 9.63 seconds with rollator and bil GRAFO    Time 8    Period Weeks    Status Achieved    Target Date 01/10/20      PT SHORT TERM GOAL #3   Title Patient will be able to go up and down a ramp and curb step to be able to negotiate community    Baseline CGA with rollator for 3' long ramp, forward and over aerobic stepper acting as curb x10 (10/13/19)    Time 4    Period Weeks    Status Achieved    Target Date 10/18/19             PT Long Term Goals - 01/01/20 1623      PT LONG TERM GOAL #1   Title Patient will demo >54/56 on berg balance scale to improve functional balance and reduce fall risk (Target date 01/10/20)    Baseline 40/56 (eval); 47/56 (11/15/19); 50/56 without GRAFO (01/01/20)    Time 6    Period Weeks    Status Revised    Target Date 02/12/20      PT LONG TERM GOAL #2   Title Patient will be able to ambulate >1100 feet with cane and with or without GRAFO to improve short distance community ambulation.    Baseline only walks with RW; 672' on 6 MWT with cane + GRAFO (11/15/19)    Time 6    Period Weeks    Status Revised    Target Date 02/12/20      PT LONG TERM GOAL #3   Title Patient will demo 4+/5 strength in bil LE grossly to improve functional strength with transfers (Target date 01/10/20)    Baseline 3+-4/5 grossly in bil LE    Time 6    Period Weeks    Status Revised    Target Date 02/12/20      PT LONG TERM GOAL #4   Title --  Baseline --    Time --    Period --     Status --      PT LONG TERM GOAL #5   Title --    Baseline --    Time --    Period --    Status --                 Plan - 01/29/20 2123    Clinical Impression Statement Continued use of bilat BIONESS in gait mode focusing on muscular and aerobic endurance with combination of Bioness on treadmill.  Pt fatigued and demonstrated increased foot slap and trunk flexion at 7 minutes on treadmill.  Also continued gait training with cane over level surface and when negotiating obstacles but adding dual task in contralateral UE.  Min A required when negotiating obstacles with dual task.  Will continue to address and progress towards LTG.    Personal Factors and Comorbidities Age;Past/Current Experience;Comorbidity 2;Time since onset of injury/illness/exacerbation;Transportation    Examination-Activity Limitations Bathing;Lift;Squat;Stairs;Stand;Transfers    Examination-Participation Restrictions Cleaning;Community Activity;Driving;Laundry;Occupation;Shop;Yard Work    Rehab Potential Fair    PT Frequency 2x / week    PT Duration 6 weeks    PT Treatment/Interventions ADLs/Self Care Home Management;Gait training;Stair training;Functional mobility training;Therapeutic activities;Therapeutic exercise;Balance training;Neuromuscular re-education;Manual techniques;Wheelchair mobility training;Patient/family education;Passive range of motion;Energy conservation;Vestibular;Visual/perceptual remediation/compensation;Joint Manipulations;Biofeedback;Electrical Stimulation    PT Next Visit Plan Feb - one session strengthening, one session Bioness bilat LE.  Bioness and Treadmill for endurance.  Gait with cane and Bioness with higher level tasks: picking up objects, low squats to reach the floor, single leg stance activities and tandem stance both with and without Bioness.  Gait outside as weather allows, stairs alternating sequence, walking holding objects.    PT Home Exercise Plan Access Code: [email or web address]. plus heel raises, and HS curls verbally added. Pt stated able to recall these without new handouts.    Consulted and Agree with Plan of Care Patient           Patient will benefit from skilled therapeutic intervention in order to improve the following deficits and impairments:  Abnormal gait,Decreased activity tolerance,Decreased balance,Decreased cognition,Decreased mobility,Decreased endurance,Decreased coordination,Decreased range of motion,Decreased strength,Dizziness,Difficulty walking,Impaired sensation,Impaired tone,Impaired UE functional use,Postural dysfunction,Pain  Visit Diagnosis: Muscle weakness (generalized)  Other abnormalities of gait and mobility  Unsteadiness on feet  Difficulty in walking, not elsewhere classified     Problem List Patient Active Problem List   Diagnosis Date Noted  . Incomplete paraplegia ([place]) 11/08/2019  . Foot drop, bilateral 11/08/2019  . Spasticity 08/18/2019  . Chronic pain syndrome 08/18/2019  . Nerve pain 08/18/2019  . Clonus 07/21/2019  . Spinal cord tumor   . Pain   . Transaminitis   . Leukocytosis   . Neurogenic bowel   . Postoperative pain   . Ependymoma ([place]) 06/08/2019  . Acute incomplete quadriplegia ([facility])   . 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 Numbness 05/10/2019  . Weakness of left lower extremity 05/10/2019  . 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 Visual disturbance 05/10/2019    Rico Junker, PT, DPT 01/29/20    9:29 PM    [facility] [address] [place], [place], [address] Phone: [phone]   Fax:  [phone]  Name: Jacob Lambert MRN: [id number] Date of Birth: 1985/12/05

## 2020-01-30 ENCOUNTER — Encounter: Payer: Self-pay | Admitting: Family Medicine

## 2020-02-01 ENCOUNTER — Other Ambulatory Visit: Payer: Self-pay

## 2020-02-01 ENCOUNTER — Ambulatory Visit: Payer: BC Managed Care – PPO | Admitting: Physical Therapy

## 2020-02-01 ENCOUNTER — Encounter: Payer: Self-pay | Admitting: Physical Therapy

## 2020-02-01 DIAGNOSIS — R2689 Other abnormalities of gait and mobility: Secondary | ICD-10-CM | POA: Diagnosis not present

## 2020-02-01 DIAGNOSIS — M6281 Muscle weakness (generalized): Secondary | ICD-10-CM

## 2020-02-01 DIAGNOSIS — R262 Difficulty in walking, not elsewhere classified: Secondary | ICD-10-CM | POA: Diagnosis not present

## 2020-02-01 DIAGNOSIS — R2681 Unsteadiness on feet: Secondary | ICD-10-CM

## 2020-02-02 NOTE — Therapy (Signed)
[facility] [address] [place], [place], [address] Phone: [phone]   Fax:  [phone]  Physical Therapy Treatment  Patient Details  Name: Jacob Lambert MRN: [id number] Date of Birth: December 24, 1985 Referring Provider (PT): Dr. Courtney Heys   Encounter Date: 02/01/2020   PT End of Session - 02/01/20 1536    Visit Number 35    Number of Visits 39    Date for PT Re-Evaluation 02/12/20    Authorization Type BCBS - VL: 30 PT/OT combined    Authorization - Visit Number 7    Authorization - Number of Visits 30    PT Start Time 1532    PT Stop Time 1615    PT Time Calculation (min) 43 min    Equipment Utilized During Treatment Other (comment)   Bioness to bil LE's with ace wraps over thight components   Activity Tolerance Patient tolerated treatment well    Behavior During Therapy [facility] for tasks assessed/performed           Past Medical History:  Diagnosis Date  . Anxiety   . Clonus   . Depression   . Headache   . Pneumonia    "walking"  . Quadriplegia and quadriparesis ([place])   . Spasticity   . Spinal cord tumor   . Wheelchair bound     Past Surgical History:  Procedure Laterality Date  . 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 NO PAST SURGERIES      There were no vitals filed for this visit.   Subjective Assessment - 02/01/20 1534    Subjective No new complaints. Been busy with movie festivals for work.    Limitations Standing;Walking;House hold activities    How long can you sit comfortably? no issues    How long can you stand comfortably? <5 min    How long can you walk comfortably? <0.25 mile    Patient Stated Goals Progress walking    Currently in Pain? Yes    Pain Score 5     Pain Location Leg    Pain  Orientation Right;Left    Pain Descriptors / Indicators Aching;Shooting;Stabbing;Tingling    Pain Type Chronic pain;Neuropathic pain    Pain Onset More than a month ago    Pain Frequency Intermittent    Aggravating Factors  increased activity, brace wear    Pain Relieving Factors pain meds/patch, CBD cream/oral droplets                 [facility] Adult PT Treatment/Exercise - 02/01/20 1536      Transfers   Transfers Sit to Stand;Stand to Sit    Sit to Stand 6: Modified independent (Device/Increase time)    Stand to Sit 6: Modified independent (Device/Increase time)      Ambulation/Gait   Ambulation/Gait Yes    Ambulation/Gait Assistance 4: Min guard    Ambulation/Gait Assistance Details had pt scanning all directions randomly with no significant balance issues noted.    Ambulation Distance (Feet) 230 Feet   x1, plus around the gym with session   Assistive device Straight cane    Gait Pattern Step-through pattern;Narrow base of support;Right foot flat    Ambulation Surface Level;Indoor      High Level Balance   High Level Balance Activities Side stepping;Backward walking    High  Level Balance Comments concurrent with Bioness to bil LE's next to counter top with straight cane for 3-4 laps each. cues on posture, step lenght and weight shifitng.      Knee/Hip Exercises: Aerobic   Tread Mill 1.2 mph x 8 minutes with bilat Bioness in gait mode and bilat UE support with focus on endurance, upright posture and full step length and stance time each LE. Min guard assist for safety.      Acupuncturist Location bilat anterior tib and quads    Electrical Stimulation Action increased muscle activation/strengthening in both open and closed chain    Electrical Stimulation Parameters refer to tablet 1 for adjusted parameters; quickfit electrodes    Electrical Stimulation Goals Strength;Neuromuscular facilitation                PT Short Term Goals -  01/01/20 1533      PT SHORT TERM GOAL #1   Title Patient will be able to ambulate 115' with cane to improve short distance ambulation within home    Baseline 115' with min/mod A with quad cane due to multi LOB and scissoring 25% (10/13/19); 672' with cane and bilat GRAFOs (11/15/19)    Time 4    Period Weeks    Status Achieved    Target Date 10/18/19      PT SHORT TERM GOAL #2   Title Pt will demo <12 seconds on timed up and go with RW to improve functional mobility (target date 01/10/20)    Baseline 14 seconds RW (eval); 13 seconds rollator (10/13/19); 13.5 seconds SPC + bilat GRAFOs (11/15/19); 9.63 seconds with rollator and bil GRAFO    Time 8    Period Weeks    Status Achieved    Target Date 01/10/20      PT SHORT TERM GOAL #3   Title Patient will be able to go up and down a ramp and curb step to be able to negotiate community    Baseline CGA with rollator for 3' long ramp, forward and over aerobic stepper acting as curb x10 (10/13/19)    Time 4    Period Weeks    Status Achieved    Target Date 10/18/19             PT Long Term Goals - 01/01/20 1623      PT LONG TERM GOAL #1   Title Patient will demo >54/56 on berg balance scale to improve functional balance and reduce fall risk (Target date 01/10/20)    Baseline 40/56 (eval); 47/56 (11/15/19); 50/56 without GRAFO (01/01/20)    Time 6    Period Weeks    Status Revised    Target Date 02/12/20      PT LONG TERM GOAL #2   Title Patient will be able to ambulate >1100 feet with cane and with or without GRAFO to improve short distance community ambulation.    Baseline only walks with RW; 672' on 6 MWT with cane + GRAFO (11/15/19)    Time 6    Period Weeks    Status Revised    Target Date 02/12/20      PT LONG TERM GOAL #3   Title Patient will demo 4+/5 strength in bil LE grossly to improve functional strength with transfers (Target date 01/10/20)    Baseline 3+-4/5 grossly in bil LE    Time 6    Period Weeks    Status  Revised    Target Date  02/12/20      PT LONG TERM GOAL #4   Title --    Baseline --    Time --    Period --    Status --      PT LONG TERM GOAL #5   Title --    Baseline --    Time --    Period --    Status --                 Plan - 02/01/20 1536    Clinical Impression Statement Today's skilled session continued with use of Bioness to bil LE's concurrent with gait/balance activities with no issues noted or reported in session. Pt continues to require up to min assist with dual tasking/dynamic gait at times Couple episodes of knee buckling with Bioness toward end of session, pt reported LE"s felt fatigued when this occured, with pt self correcting. The pt is progressing and should benefit from continued PT to progress toward unmet goals.    Personal Factors and Comorbidities Age;Past/Current Experience;Comorbidity 2;Time since onset of injury/illness/exacerbation;Transportation    Examination-Activity Limitations Bathing;Lift;Squat;Stairs;Stand;Transfers    Examination-Participation Restrictions Cleaning;Community Activity;Driving;Laundry;Occupation;Shop;Yard Work    Rehab Potential Fair    PT Frequency 2x / week    PT Duration 6 weeks    PT Treatment/Interventions ADLs/Self Care Home Management;Gait training;Stair training;Functional mobility training;Therapeutic activities;Therapeutic exercise;Balance training;Neuromuscular re-education;Manual techniques;Wheelchair mobility training;Patient/family education;Passive range of motion;Energy conservation;Vestibular;Visual/perceptual remediation/compensation;Joint Manipulations;Biofeedback;Electrical Stimulation    PT Next Visit Plan Feb - one session strengthening, one session Bioness bilat LE.  Bioness and Treadmill for endurance.  Gait with cane and Bioness with higher level tasks: picking up objects, low squats to reach the floor, single leg stance activities and tandem stance both with and without Bioness.  Gait outside as weather  allows, stairs alternating sequence, walking holding objects.    PT Home Exercise Plan Access Code: [email or web address]; [email or web address]. plus heel raises, and HS curls verbally added. Pt stated able to recall these without new handouts.    Consulted and Agree with Plan of Care Patient           Patient will benefit from skilled therapeutic intervention in order to improve the following deficits and impairments:  Abnormal gait,Decreased activity tolerance,Decreased balance,Decreased cognition,Decreased mobility,Decreased endurance,Decreased coordination,Decreased range of motion,Decreased strength,Dizziness,Difficulty walking,Impaired sensation,Impaired tone,Impaired UE functional use,Postural dysfunction,Pain  Visit Diagnosis: Muscle weakness (generalized)  Other abnormalities of gait and mobility  Unsteadiness on feet  Difficulty in walking, not elsewhere classified     Problem List Patient Active Problem List   Diagnosis Date Noted  . Incomplete paraplegia ([place]) 11/08/2019  . Foot drop, bilateral 11/08/2019  . Spasticity 08/18/2019  . Chronic pain syndrome 08/18/2019  . Nerve pain 08/18/2019  . Clonus 07/21/2019  . Spinal cord tumor   . Pain   . Transaminitis   . Leukocytosis   . Neurogenic bowel   . Postoperative pain   . 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 Acute incomplete quadriplegia ([facility])   . 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 Numbness 05/10/2019  . Weakness of left lower extremity 05/10/2019  . 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 Visual disturbance 05/10/2019   Willow Ora, PTA, [facility]patient Neuro [facility] [address], [place], [place] [address] [phone] 02/02/20, 4:58 PM   Name: Jacob Lambert MRN: [id number] Date of Birth: May 16, 1985

## 2020-02-06 ENCOUNTER — Other Ambulatory Visit: Payer: Self-pay

## 2020-02-06 ENCOUNTER — Ambulatory Visit: Payer: BC Managed Care – PPO | Attending: Physical Medicine and Rehabilitation | Admitting: Physical Therapy

## 2020-02-06 ENCOUNTER — Encounter: Payer: Self-pay | Admitting: Physical Therapy

## 2020-02-06 DIAGNOSIS — R278 Other lack of coordination: Secondary | ICD-10-CM | POA: Diagnosis present

## 2020-02-06 DIAGNOSIS — R2689 Other abnormalities of gait and mobility: Secondary | ICD-10-CM | POA: Diagnosis present

## 2020-02-06 DIAGNOSIS — R208 Other disturbances of skin sensation: Secondary | ICD-10-CM | POA: Diagnosis present

## 2020-02-06 DIAGNOSIS — M6281 Muscle weakness (generalized): Secondary | ICD-10-CM | POA: Diagnosis not present

## 2020-02-06 DIAGNOSIS — R262 Difficulty in walking, not elsewhere classified: Secondary | ICD-10-CM | POA: Diagnosis present

## 2020-02-06 DIAGNOSIS — R2681 Unsteadiness on feet: Secondary | ICD-10-CM

## 2020-02-06 NOTE — Therapy (Signed)
[facility] [address] [place], [place], [address] Phone: [phone]   Fax:  [phone]  Physical Therapy Treatment  Patient Details  Name: Jacob Lambert MRN: [id number] Date of Birth: 05-09-1985 Referring Provider (PT): Dr. Courtney Heys   Encounter Date: 02/06/2020   PT End of Session - 02/06/20 1629    Visit Number 36    Number of Visits 39    Date for PT Re-Evaluation 02/12/20    Authorization Type BCBS - VL: 30 PT/OT combined    Authorization - Visit Number 8    Authorization - Number of Visits 30    PT Start Time 6195    PT Stop Time 1615    PT Time Calculation (min) 38 min    Equipment Utilized During Treatment Other (comment)   bilat AFO   Activity Tolerance Patient tolerated treatment well    Behavior During Therapy [facility] for tasks assessed/performed           Past Medical History:  Diagnosis Date  . Anxiety   . Clonus   . Depression   . Headache   . Pneumonia    "walking"  . Quadriplegia and quadriparesis ([place])   . Spasticity   . Spinal cord tumor   . Wheelchair bound     Past Surgical History:  Procedure Laterality Date  . 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 NO PAST SURGERIES      There were no vitals filed for this visit.   Subjective Assessment - 02/06/20 1539    Subjective [place] 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  Would not have been able to do it with the rollator.  Wore AFO today.    Limitations Standing;Walking;House hold activities    How long can you sit comfortably? no issues    How long can you stand comfortably? <5 min    How long can you walk comfortably? <0.25 mile    Patient Stated Goals Progress walking    Currently in  Pain? Yes    Pain Onset More than a month ago                             [facility] Adult PT Treatment/Exercise - 02/06/20 1600      Ambulation/Gait   Ambulation/Gait Yes    Ambulation/Gait Assistance 5: Supervision    Ambulation/Gait Assistance Details with Bilat AFO and cane; continues to have some instability with turns/pivoting    Ambulation Distance (Feet) 50 Feet    Assistive device Straight cane    Gait Pattern Step-through pattern;Lateral hip instability;Narrow base of support    Ambulation Surface Level;Indoor      Knee/Hip Exercises: Standing   Hip Abduction Stengthening;Right;Left;2 sets;10 reps;Knee bent;Knee straight    Abduction Limitations first performed statically with red theraband around thighs with bilat UE support x 10 reps each side.  Then performed resisted squat side stepping to L and R along counter top x 2 sets each direction.    Other Standing Knee Exercises With red theraband around thighs performed 10 reps each side hip flexion > ABD > extension without touching foot to the floor.  UE support  Balance Exercises - 02/06/20 1608      Balance Exercises: Standing   Standing, One Foot on a Step Eyes open;6 inch;3 reps;Limitations    Standing, One Foot on a Step Limitations one foot on yoga block and one UE support x 12 seconds each side x 3 reps.  Increased assistance required when standing on RLE, LLE on block due to lateral LOB and knee tracking forwards due to ankle weakness.    Tandem Gait Forward;Upper extremity support;Limitations    Tandem Gait Limitations UE support on counter top               PT Short Term Goals - 01/01/20 1533      PT SHORT TERM GOAL #1   Title Patient will be able to ambulate 115' with cane to improve short distance ambulation within home    Baseline 115' with min/mod A with quad cane due to multi LOB and scissoring 25% (10/13/19); 672' with cane and bilat GRAFOs (11/15/19)    Time 4     Period Weeks    Status Achieved    Target Date 10/18/19      PT SHORT TERM GOAL #2   Title Pt will demo <12 seconds on timed up and go with RW to improve functional mobility (target date 01/10/20)    Baseline 14 seconds RW (eval); 13 seconds rollator (10/13/19); 13.5 seconds SPC + bilat GRAFOs (11/15/19); 9.63 seconds with rollator and bil GRAFO    Time 8    Period Weeks    Status Achieved    Target Date 01/10/20      PT SHORT TERM GOAL #3   Title Patient will be able to go up and down a ramp and curb step to be able to negotiate community    Baseline CGA with rollator for 3' long ramp, forward and over aerobic stepper acting as curb x10 (10/13/19)    Time 4    Period Weeks    Status Achieved    Target Date 10/18/19             PT Long Term Goals - 01/01/20 1623      PT LONG TERM GOAL #1   Title Patient will demo >54/56 on berg balance scale to improve functional balance and reduce fall risk (Target date 01/10/20)    Baseline 40/56 (eval); 47/56 (11/15/19); 50/56 without GRAFO (01/01/20)    Time 6    Period Weeks    Status Revised    Target Date 02/12/20      PT LONG TERM GOAL #2   Title Patient will be able to ambulate >1100 feet with cane and with or without GRAFO to improve short distance community ambulation.    Baseline only walks with RW; 672' on 6 MWT with cane + GRAFO (11/15/19)    Time 6    Period Weeks    Status Revised    Target Date 02/12/20      PT LONG TERM GOAL #3   Title Patient will demo 4+/5 strength in bil LE grossly to improve functional strength with transfers (Target date 01/10/20)    Baseline 3+-4/5 grossly in bil LE    Time 6    Period Weeks    Status Revised    Target Date 02/12/20      PT LONG TERM GOAL #4   Title --    Baseline --    Time --    Period --    Status --  PT LONG TERM GOAL #5   Title --    Baseline --    Time --    Period --    Status --                 Plan - 02/06/20 1629    Clinical Impression Statement  Pt wearing AFO today so focused on functional LE strengthening and balance.  Pt able to progress resistance used for LE strengthening to red band (pt was previously using yellow) and pt had very little difficulty activating against resistance of red band.  Pt may be able to upgrade to green band soon.  Pt continues to demonstrate significant proximal hip/glute med weakness resulting in instability with narrow BOS and SLS.  Will continue to address and progress towards LTG.    Personal Factors and Comorbidities Age;Past/Current Experience;Comorbidity 2;Time since onset of injury/illness/exacerbation;Transportation    Examination-Activity Limitations Bathing;Lift;Squat;Stairs;Stand;Transfers    Examination-Participation Restrictions Cleaning;Community Activity;Driving;Laundry;Occupation;Shop;Yard Work    Rehab Potential Fair    PT Frequency 2x / week    PT Duration 6 weeks    PT Treatment/Interventions ADLs/Self Care Home Management;Gait training;Stair training;Functional mobility training;Therapeutic activities;Therapeutic exercise;Balance training;Neuromuscular re-education;Manual techniques;Wheelchair mobility training;Patient/family education;Passive range of motion;Energy conservation;Vestibular;Visual/perceptual remediation/compensation;Joint Manipulations;Biofeedback;Electrical Stimulation    PT Next Visit Plan Thursday-Bioness day; will check goals on 2/8 and send to me for recert.  When wearing AFO - focus on strengthening especially glute med, balance for narrow BOS and SLS, gait negotiating down stairs, compliant surfaces.  With Bioness - gait on treadmill; gait outside as able, stairs, LE strengthening    PT Home Exercise Plan Access Code: [email or web address]. plus heel raises, and HS curls verbally added. Pt stated able to recall these without new handouts.    Consulted and Agree with Plan of Care Patient           Patient will benefit from skilled therapeutic intervention in order to  improve the following deficits and impairments:  Abnormal gait,Decreased activity tolerance,Decreased balance,Decreased cognition,Decreased mobility,Decreased endurance,Decreased coordination,Decreased range of motion,Decreased strength,Dizziness,Difficulty walking,Impaired sensation,Impaired tone,Impaired UE functional use,Postural dysfunction,Pain  Visit Diagnosis: Muscle weakness (generalized)  Other abnormalities of gait and mobility  Unsteadiness on feet  Difficulty in walking, not elsewhere classified     Problem List Patient Active Problem List   Diagnosis Date Noted  . Incomplete paraplegia ([place]) 11/08/2019  . Foot drop, bilateral 11/08/2019  . Spasticity 08/18/2019  . Chronic pain syndrome 08/18/2019  . Nerve pain 08/18/2019  . Clonus 07/21/2019  . Spinal cord tumor   . Pain   . Transaminitis   . Leukocytosis   . Neurogenic bowel   . Postoperative pain   . 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 Acute incomplete quadriplegia ([facility])   . 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 Numbness 05/10/2019  . Weakness of left lower extremity 05/10/2019  . 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 Visual disturbance 05/10/2019    Rico Junker, PT, DPT 02/06/20    4:38 PM    [facility] [address] [place], [place], [address] Phone: [phone]   Fax:  [phone]  Name: Jacob Lambert MRN: [id number] Date of Birth: [birth year]

## 2020-02-08 ENCOUNTER — Other Ambulatory Visit: Payer: Self-pay

## 2020-02-08 ENCOUNTER — Encounter: Payer: Self-pay | Admitting: Physical Therapy

## 2020-02-08 ENCOUNTER — Ambulatory Visit: Payer: BC Managed Care – PPO | Admitting: Physical Therapy

## 2020-02-08 DIAGNOSIS — R262 Difficulty in walking, not elsewhere classified: Secondary | ICD-10-CM | POA: Diagnosis not present

## 2020-02-08 DIAGNOSIS — R2681 Unsteadiness on feet: Secondary | ICD-10-CM

## 2020-02-08 DIAGNOSIS — M6281 Muscle weakness (generalized): Secondary | ICD-10-CM | POA: Diagnosis not present

## 2020-02-08 DIAGNOSIS — R2689 Other abnormalities of gait and mobility: Secondary | ICD-10-CM | POA: Diagnosis not present

## 2020-02-08 DIAGNOSIS — R208 Other disturbances of skin sensation: Secondary | ICD-10-CM | POA: Diagnosis not present

## 2020-02-08 DIAGNOSIS — R278 Other lack of coordination: Secondary | ICD-10-CM | POA: Diagnosis not present

## 2020-02-09 ENCOUNTER — Other Ambulatory Visit: Payer: Self-pay | Admitting: Radiation Therapy

## 2020-02-09 NOTE — Therapy (Signed)
[facility] [address] [place], [place], [address] Phone: [phone]   Fax:  [phone]  Physical Therapy Treatment  Patient Details  Name: Jacob Lambert MRN: [id number] Date of Birth: 1985/06/14 Referring Provider (PT): Dr. Courtney Heys   Encounter Date: 02/08/2020   PT End of Session - 02/08/20 1541    Visit Number 37    Number of Visits 39    Date for PT Re-Evaluation 02/12/20    Authorization Type BCBS - VL: 30 PT/OT combined    Authorization - Visit Number 9    Authorization - Number of Visits 30    PT Start Time [id number]    PT Stop Time 1615    PT Time Calculation (min) 40 min    Equipment Utilized During Treatment Other (comment)   Bioness   Activity Tolerance Patient tolerated treatment well    Behavior During Therapy [facility] for tasks assessed/performed           Past Medical History:  Diagnosis Date  . Anxiety   . Clonus   . Depression   . Headache   . Pneumonia    "walking"  . Quadriplegia and quadriparesis ([place])   . Spasticity   . Spinal cord tumor   . Wheelchair bound     Past Surgical History:  Procedure Laterality Date  . LAMINECTOMY N/A 06/01/2019   Procedure: Cervical six-seven Laminoplasty, biopsy and resection of intramedullary spinal cord tumor;  Surgeon: [name], MD;  Location: [place];  Service: Neurosurgery;  Laterality: N/A;  . NO PAST SURGERIES      There were no vitals filed for this visit.   Subjective Assessment - 02/08/20 1536    Subjective No new complaints. No falls.    Limitations Standing;Walking;House hold activities    How long can you sit comfortably? no issues    How long can you stand comfortably? <5 min    How long can you walk comfortably? <0.25 mile    Patient Stated Goals Progress walking    Currently in Pain? Yes    Pain Score 5     Pain Location Leg    Pain Orientation Right;Left    Pain Descriptors / Indicators  Aching;Stabbing;Tingling;Shooting    Pain Type Chronic pain;Neuropathic pain    Pain Onset More than a month ago    Pain Frequency Intermittent    Aggravating Factors  increased activity, brace wear    Pain Relieving Factors pain meds/patch, CBD cream/oral droplets                [facility] Adult PT Treatment/Exercise - 02/08/20 1541      Transfers   Transfers Sit to Stand;Stand to Sit    Sit to Stand 6: Modified independent (Device/Increase time)    Stand to Sit 6: Modified independent (Device/Increase time)      Ambulation/Gait   Ambulation/Gait Yes    Ambulation/Gait Assistance 5: Supervision;4: Min guard    Ambulation/Gait Assistance Details concurrent with bil Bioness this session. Min guard with challenges to gait at times, otherwise supervision    Ambulation Distance (Feet) 230 Feet   x2, plus around gym with session   Assistive device Straight cane;Other (Comment)    Gait Pattern Step-through pattern;Lateral hip instability;Narrow base of support    Ambulation Surface Level;Indoor    Gait Comments 1st lap had pt scanning all directions, negotiatiing between tight spaces and around furniture. with 2cd lap had pt self tossing hackey sack while engaged in  conversation.      High Level Balance   High Level Balance Activities Side stepping;Backward walking    High Level Balance Comments concurrent with Bioness to bil LE's next to counter top with straight cane for 3-4 laps each. cues on posture, step length and weight shifitng.      Neuro Re-ed    Neuro Re-ed Details  for balance/muscle re-ed: forward gait with cane/Bioness across red and blue mats with bean bags scattered underneath to create unstable complaint surfaces for 6 laps with min guard to min assist for balance needed.      Acupuncturist Location bilat anterior tib and Designer, television/film set Action for increased muscle activation and strengthening    Electrical Stimulation  Parameters refer to tablet 1 for adjusted parameters; quick fit electrodes    Electrical Stimulation Goals Strength;Neuromuscular facilitation                 PT Short Term Goals - 01/01/20 1533      PT SHORT TERM GOAL #1   Title Patient will be able to ambulate 115' with cane to improve short distance ambulation within home    Baseline 115' with min/mod A with quad cane due to multi LOB and scissoring 25% (10/13/19); 672' with cane and bilat GRAFOs (11/15/19)    Time 4    Period Weeks    Status Achieved    Target Date 10/18/19      PT SHORT TERM GOAL #2   Title Pt will demo <12 seconds on timed up and go with RW to improve functional mobility (target date 01/10/20)    Baseline 14 seconds RW (eval); 13 seconds rollator (10/13/19); 13.5 seconds SPC + bilat GRAFOs (11/15/19); 9.63 seconds with rollator and bil GRAFO    Time 8    Period Weeks    Status Achieved    Target Date 01/10/20      PT SHORT TERM GOAL #3   Title Patient will be able to go up and down a ramp and curb step to be able to negotiate community    Baseline CGA with rollator for 3' long ramp, forward and over aerobic stepper acting as curb x10 (10/13/19)    Time 4    Period Weeks    Status Achieved    Target Date 10/18/19             PT Long Term Goals - 01/01/20 1623      PT LONG TERM GOAL #1   Title Patient will demo >54/56 on berg balance scale to improve functional balance and reduce fall risk (Target date 01/10/20)    Baseline 40/56 (eval); 47/56 (11/15/19); 50/56 without GRAFO (01/01/20)    Time 6    Period Weeks    Status Revised    Target Date 02/12/20      PT LONG TERM GOAL #2   Title Patient will be able to ambulate >1100 feet with cane and with or without GRAFO to improve short distance community ambulation.    Baseline only walks with RW; 672' on 6 MWT with cane + GRAFO (11/15/19)    Time 6    Period Weeks    Status Revised    Target Date 02/12/20      PT LONG TERM GOAL #3   Title  Patient will demo 4+/5 strength in bil LE grossly to improve functional strength with transfers (Target date 01/10/20)    Baseline 3+-4/5 grossly in  bil LE    Time 6    Period Weeks    Status Revised    Target Date 02/12/20      PT LONG TERM GOAL #4   Title --    Baseline --    Time --    Period --    Status --      PT LONG TERM GOAL #5   Title --    Baseline --    Time --    Period --    Status --                 Plan - 02/08/20 1541    Clinical Impression Statement Today's skilled session continued to focus on dynamic gait/balance with Bioness and cane with no issues noted or reported in session. Began gait training on simulated uneven compliant surfaces this session. The pt is progressing toward goals and should benefit from continued PT to progress toward unmet goals.    Personal Factors and Comorbidities Age;Past/Current Experience;Comorbidity 2;Time since onset of injury/illness/exacerbation;Transportation    Examination-Activity Limitations Bathing;Lift;Squat;Stairs;Stand;Transfers    Examination-Participation Restrictions Cleaning;Community Activity;Driving;Laundry;Occupation;Shop;Yard Work    Rehab Potential Fair    PT Frequency 2x / week    PT Duration 6 weeks    PT Treatment/Interventions ADLs/Self Care Home Management;Gait training;Stair training;Functional mobility training;Therapeutic activities;Therapeutic exercise;Balance training;Neuromuscular re-education;Manual techniques;Wheelchair mobility training;Patient/family education;Passive range of motion;Energy conservation;Vestibular;Visual/perceptual remediation/compensation;Joint Manipulations;Biofeedback;Electrical Stimulation    PT Next Visit Plan will check goals on 2/8 and send to me for recert.  When wearing AFO - focus on strengthening especially glute med, balance for narrow BOS and SLS, gait negotiating down stairs, compliant surfaces.  With Bioness - gait on treadmill; gait outside as able, stairs, LE  strengthening    PT Home Exercise Plan Access Code: [email or web address]; [email or web address]. plus heel raises, and HS curls verbally added. Pt stated able to recall these without new handouts.    Consulted and Agree with Plan of Care Patient           Patient will benefit from skilled therapeutic intervention in order to improve the following deficits and impairments:  Abnormal gait,Decreased activity tolerance,Decreased balance,Decreased cognition,Decreased mobility,Decreased endurance,Decreased coordination,Decreased range of motion,Decreased strength,Dizziness,Difficulty walking,Impaired sensation,Impaired tone,Impaired UE functional use,Postural dysfunction,Pain  Visit Diagnosis: Muscle weakness (generalized)  Other abnormalities of gait and mobility  Unsteadiness on feet  Difficulty in walking, not elsewhere classified     Problem List Patient Active Problem List   Diagnosis Date Noted  . Incomplete paraplegia ([place]) 11/08/2019  . Foot drop, bilateral 11/08/2019  . Spasticity 08/18/2019  . Chronic pain syndrome 08/18/2019  . Nerve pain 08/18/2019  . Clonus 07/21/2019  . Spinal cord tumor   . Pain   . Transaminitis   . Leukocytosis   . Neurogenic bowel   . Postoperative pain   . 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 Acute incomplete quadriplegia (HCC)   . 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 Numbness 05/10/2019  . Weakness of left lower extremity 05/10/2019  . 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 Visual disturbance 05/10/2019    Willow Ora, PTA, [facility] Out[facility] Neuro [facility] [address], [place], [place] [address] [phone] 02/09/20, 4:54 PM   Name: Jacob Lambert MRN: [id number] Date of Birth: July 10, 1985

## 2020-02-13 ENCOUNTER — Ambulatory Visit: Payer: BC Managed Care – PPO | Admitting: Physical Therapy

## 2020-02-13 ENCOUNTER — Other Ambulatory Visit: Payer: Self-pay

## 2020-02-13 ENCOUNTER — Encounter: Payer: Self-pay | Admitting: Physical Therapy

## 2020-02-13 DIAGNOSIS — R2689 Other abnormalities of gait and mobility: Secondary | ICD-10-CM | POA: Diagnosis not present

## 2020-02-13 DIAGNOSIS — R262 Difficulty in walking, not elsewhere classified: Secondary | ICD-10-CM | POA: Diagnosis not present

## 2020-02-13 DIAGNOSIS — M6281 Muscle weakness (generalized): Secondary | ICD-10-CM | POA: Diagnosis not present

## 2020-02-13 DIAGNOSIS — R278 Other lack of coordination: Secondary | ICD-10-CM

## 2020-02-13 DIAGNOSIS — R2681 Unsteadiness on feet: Secondary | ICD-10-CM

## 2020-02-13 DIAGNOSIS — R208 Other disturbances of skin sensation: Secondary | ICD-10-CM | POA: Diagnosis not present

## 2020-02-15 ENCOUNTER — Encounter: Payer: Self-pay | Admitting: Physical Therapy

## 2020-02-15 ENCOUNTER — Ambulatory Visit: Payer: BC Managed Care – PPO | Admitting: Physical Therapy

## 2020-02-15 ENCOUNTER — Other Ambulatory Visit: Payer: Self-pay

## 2020-02-15 DIAGNOSIS — R262 Difficulty in walking, not elsewhere classified: Secondary | ICD-10-CM

## 2020-02-15 DIAGNOSIS — R208 Other disturbances of skin sensation: Secondary | ICD-10-CM | POA: Diagnosis not present

## 2020-02-15 DIAGNOSIS — R2689 Other abnormalities of gait and mobility: Secondary | ICD-10-CM | POA: Diagnosis not present

## 2020-02-15 DIAGNOSIS — R2681 Unsteadiness on feet: Secondary | ICD-10-CM | POA: Diagnosis not present

## 2020-02-15 DIAGNOSIS — R278 Other lack of coordination: Secondary | ICD-10-CM

## 2020-02-15 DIAGNOSIS — M6281 Muscle weakness (generalized): Secondary | ICD-10-CM

## 2020-02-15 NOTE — Therapy (Addendum)
[facility] [address] [place], [place], [address] Phone: [phone]   Fax:  [phone]  Physical Therapy Treatment  Patient Details  Name: Jacob Lambert MRN: [id number] Date of Birth: April 28, 1985 Referring Provider (PT): Dr. Courtney Heys   Encounter Date: 02/13/2020    02/13/20 1539  PT Visits / Re-Eval  Visit Number 34  Number of Visits 55  Date for PT Re-Evaluation 04/15/20  Authorization  Authorization Type BCBS - VL: 30 PT/OT combined  Authorization - Visit Number 10  Authorization - Number of Visits 30  PT Time Calculation  PT Start Time 1532  PT Stop Time 1615  PT Time Calculation (min) 43 min  PT - End of Session  Equipment Utilized During Treatment Other (comment) (Bioness)  Activity Tolerance Patient tolerated treatment well  Behavior During Therapy WFL for tasks assessed/performed    Past Medical History:  Diagnosis Date  . Anxiety   . Clonus   . Depression   . Headache   . Pneumonia    "walking"  . Quadriplegia and quadriparesis ([place])   . Spasticity   . Spinal cord tumor   . Wheelchair bound     Past Surgical History:  Procedure Laterality Date  . LAMINECTOMY N/A 06/01/2019   Procedure: Cervical six-seven Laminoplasty, biopsy and resection of intramedullary spinal cord tumor;  Surgeon: [name], MD;  Location: [facility];  Service: Neurosurgery;  Laterality: N/A;  . NO PAST SURGERIES      There were no vitals filed for this visit.     02/13/20 1537  Symptoms/Limitations  Subjective No new complaints. No falls.  Limitations Standing;Walking;House hold activities  How long can you sit comfortably? no issues  How long can you stand comfortably? <5 min  How long can you walk comfortably? <0.25 mile  Patient Stated Goals Progress walking  Pain Assessment  Currently in Pain? Yes  Pain Score 5  Pain Location Leg  Pain Orientation Right;Left  Pain Descriptors / Indicators  Aching;Stabbing;Tingling;Shooting  Pain Type Chronic pain;Neuropathic pain  Pain Onset More than a month ago  Pain Frequency Intermittent  Aggravating Factors  increased activity, brace wear  Pain Relieving Factors pain meds/patch, CBD cream/oral droplets        02/13/20 1541  Assessment  Medical Diagnosis Weakness (cervical spinal cord tumor resection/post radiation)  Referring Provider (PT) Dr. Courtney Heys  Onset Date/Surgical Date 06/01/19  Hand Dominance Left  Prior Therapy [facility] inpatient rehab, home health  Precautions  Precautions Fall  Required Braces or Orthoses Other Brace/Splint (bilat AFO)  Prior Function  Level of Independence Independent  Vocation Full time employment  Vocation Requirements [place]  Leisure travel the world and travel a lot, arts  Strength  Left Knee Flexion 5/5  Left Knee Extension 5/5  Right Hip Flexion 4+/5  Right Hip ABduction 3+/5  Left Hip Flexion 5/5  Left Hip ABduction 4+/5  Right Knee Flexion 4-/5  Right Knee Extension 4+/5  Overall Strength Deficits  Transfers  Transfers Sit to Stand;Stand to Sit  Sit to Stand 6: Modified independent (Device/Increase time)  Stand to Sit 6: Modified independent (Device/Increase time)  Ambulation/Gait  Ambulation/Gait Yes  Ambulation/Gait Assistance 5: Supervision;4: Min guard  Ambulation/Gait Assistance Details min guard for safety with outdoor unven paved surfaces with minor veering noted with narrow stance. otherwise supervision with gait.  Ambulation Distance (Feet) 730 Feet (x1, plus with session around gym)  Assistive device Straight cane;Other (Comment) (bil braces)  Gait Pattern Step-through pattern;Lateral hip instability;Narrow base of support  Ambulation Surface Level;Indoor;Unlevel;Outdoor;Paved  Standardized Balance Assessment  Standardized Balance Assessment Berg Balance Test;10 meter walk test  10 Meter Walk 9.75 sec's= 3.36  ft/sec with min guard assist  with cane with rubber quad tip  Berg Balance Test  Sit to Stand 4  Standing Unsupported 4  Sitting with Back Unsupported but Feet Supported on Floor or Stool 4  Stand to Sit 4  Transfers 4  Standing Unsupported with Eyes Closed 4  Standing Unsupported with Feet Together 4  From Standing, Reach Forward with Outstretched Arm 3 (7 inches)  From Standing Position, Pick up Object from Floor 4  From Standing Position, Turn to Look Behind Over each Shoulder 4  Turn 360 Degrees 4  Standing Unsupported, Alternately Place Feet on Step/Stool 4  Standing Unsupported, One Foot in Front 3  Standing on One Leg 1  Total Score 51        PT Short Term Goals - 01/01/20 1533      PT SHORT TERM GOAL #1   Title Patient will be able to ambulate 115' with cane to improve short distance ambulation within home    Baseline 115' with min/mod A with quad cane due to multi LOB and scissoring 25% (10/13/19); 672' with cane and bilat GRAFOs (11/15/19)    Time 4    Period Weeks    Status Achieved    Target Date 10/18/19      PT SHORT TERM GOAL #2   Title Pt will demo <12 seconds on timed up and go with RW to improve functional mobility (target date 01/10/20)    Baseline 14 seconds RW (eval); 13 seconds rollator (10/13/19); 13.5 seconds SPC + bilat GRAFOs (11/15/19); 9.63 seconds with rollator and bil GRAFO    Time 8    Period Weeks    Status Achieved    Target Date 01/10/20      PT SHORT TERM GOAL #3   Title Patient will be able to go up and down a ramp and curb step to be able to negotiate community    Baseline CGA with rollator for 3' long ramp, forward and over aerobic stepper acting as curb x10 (10/13/19)    Time 4    Period Weeks    Status Achieved    Target Date 10/18/19             PT Long Term Goals - 02/13/20 1540      PT LONG TERM GOAL #1   Title Patient will demo >54/56 on berg balance scale to improve functional balance and reduce fall risk (Target date 01/10/20)    Baseline 40/56  (eval); 47/56 (11/15/19); 50/56 without GRAFO (01/01/20); 02/13/20: 51/56 scored with bil GRAFO's on    Time --    Period --    Status Not Met      PT LONG TERM GOAL #2   Title Patient will be able to ambulate >1100 feet with cane and with or without GRAFO to improve short distance community ambulation.    Baseline only walks with RW; 672' on 6 MWT with cane + GRAFO (11/15/19)    Time 6    Period Weeks    Status Revised      PT LONG TERM GOAL #3   Title Patient will demo 4+/5 strength in bil LE grossly to improve functional strength with transfers (Target date 01/10/20)    Baseline 3+-4/5 grossly in bil LE  Time 6    Period Weeks    Status Revised          New Goals for recertification:   PT Short Term Goals - 02/15/20 1556      PT SHORT TERM GOAL #1   Title = LTG           PT Long Term Goals - 02/15/20 1556      PT LONG TERM GOAL #1   Title Patient will demo >54/56 on berg balance scale to improve functional balance and reduce fall risk (Cert for 8 weeks; LTG set at 4 weeks.  Reset if needed after 4 weeks)    Baseline 02/13/20: 51/56    Time 4    Period Weeks    Status Revised    Target Date 03/16/20      PT LONG TERM GOAL #2   Title Patient will be able to ambulate >1000 feet outside with LRAD and bilat GRAFO to improve short distance community ambulation.    Baseline 02/13/20: 730 feet with cane on indoor/outdoor paved surfaces with min guard assist for safety, improved just not to goal level    Time 4    Period Weeks    Status Revised    Target Date 03/16/20      PT LONG TERM GOAL #3   Title Pt will improve gait velocity with LRAD and bilat GRAFO's to >/= 3.6 ft/sec    Baseline 3.36 with cane    Time 4    Period Weeks    Status New    Target Date 03/16/20      PT LONG TERM GOAL #4   Title Pt will demonstrate ability to negotiate curb, ramp and 12 stairs with cane, bilat GRAFO's safely Mod I    Time 4    Period Weeks    Status Revised    Target Date  03/16/20             02/13/20 1539  Plan  Clinical Impression Statement Today's skilled session focused on progress toward LTGs with progress made, just not to goal level. Pt improved the Berg Balance Test by 1 point with score of 51/56. Pt's gait speed with cane 3.36 ft/sec. Pt also improved manual musle testing scores in with bil LE's to 4+/5 or great with all except for right hip abduction still at 3+/5.  Pt is making steady progress and continues to present with functional LE weakness, impaired balance and impaired gait that place patient at increased risk for falls when ambulating in their home and community.  Pt will benefit from continued skilled PT services to address these ongoing impairments to maximize functional mobility independence and decrease falls risk as pt increases travel and works toward moving and living on their own.  Personal Factors and Comorbidities Age;Past/Current Experience;Comorbidity 2;Time since onset of injury/illness/exacerbation;Transportation  Examination-Activity Limitations Bathing;Lift;Squat;Stairs;Stand;Transfers  Examination-Participation Restrictions Cleaning;Community Activity;Driving;Laundry;Occupation;Shop;Yard Work  Pt will benefit from skilled therapeutic intervention in order to improve on the following deficits Abnormal gait;Decreased activity tolerance;Decreased balance;Decreased mobility;Decreased endurance;Decreased coordination;Decreased strength;Difficulty walking;Impaired sensation;Impaired tone;Postural dysfunction;Pain  Rehab Potential Good  PT Frequency 2x / week  PT Duration 8 weeks  PT Treatment/Interventions ADLs/Self Care Home Management;Gait training;Stair training;Functional mobility training;Therapeutic activities;Therapeutic exercise;Balance training;Neuromuscular re-education;Patient/family education;Energy conservation;Electrical Stimulation;DME Instruction;Orthotic Fit/Training;Aquatic Therapy  PT Next Visit Plan When wearing AFO  - focus on strengthening especially glute med, balance for narrow BOS and SLS, gait negotiating down stairs, compliant surfaces.  With Bioness - gait on treadmill; gait outside  as able, stairs, LE strengthening  PT Home Exercise Plan Access Code: [email or web address]. plus heel raises, and HS curls verbally added. Pt stated able to recall these without new handouts.  Recommended Other Services Aquatic therapy  Consulted and Agree with Plan of Care Patient     Patient will benefit from skilled therapeutic intervention in order to improve the following deficits and impairments:  Abnormal gait,Decreased activity tolerance,Decreased balance,Decreased mobility,Decreased endurance,Decreased coordination,Decreased strength,Difficulty walking,Impaired sensation,Impaired tone,Postural dysfunction,Pain  Visit Diagnosis: Muscle weakness (generalized)  Other abnormalities of gait and mobility  Unsteadiness on feet  Difficulty in walking, not elsewhere classified  Other disturbances of skin sensation  Other lack of coordination     Problem List Patient Active Problem List   Diagnosis Date Noted  . Incomplete paraplegia ([place]) 11/08/2019  . Foot drop, bilateral 11/08/2019  . Spasticity 08/18/2019  . Chronic pain syndrome 08/18/2019  . Nerve pain 08/18/2019  . Clonus 07/21/2019  . Spinal cord tumor   . Pain   . Transaminitis   . Leukocytosis   . Neurogenic bowel   . Postoperative pain   . 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 Acute incomplete quadriplegia (HCC)   . 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 Numbness 05/10/2019  . Weakness of left lower extremity 05/10/2019  . 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 Visual disturbance 05/10/2019    Willow Ora, PTA, [facility] Outpatient Neuro [facility] [address] [place], [place] [address] [phone] 02/15/20, 11:15 AM   Addendum by primary PT: Rico Junker, PT, DPT 02/15/20    4:10 PM     Name: Othello Dickenson MRN:  [id number] Date of Birth: [birth year]

## 2020-02-15 NOTE — Addendum Note (Signed)
Addended by: Rico Junker on: 02/15/2020 04:12 PM   Modules accepted: Orders

## 2020-02-16 NOTE — Therapy (Signed)
[facility] [address] [place], [place], [address] Phone: [phone]   Fax:  [phone]  Physical Therapy Treatment  Patient Details  Name: Jacob Lambert MRN: [id number] Date of Birth: [birth year] Referring Provider (PT): Dr. Courtney Heys   Encounter Date: 02/15/2020   PT End of Session - 02/15/20 1537    Visit Number 77    Number of Visits 55    Date for PT Re-Evaluation 04/15/20    Authorization Type BCBS - VL: 30 PT/OT combined    Authorization - Visit Number 11    Authorization - Number of Visits 30    PT Start Time 6333    PT Stop Time 1615    PT Time Calculation (min) 41 min    Equipment Utilized During Treatment Other (comment)   Bioness   Activity Tolerance Patient tolerated treatment well    Behavior During Therapy WFL for tasks assessed/performed           Past Medical History:  Diagnosis Date  . Anxiety   . Clonus   . Depression   . Headache   . Pneumonia    "walking"  . Quadriplegia and quadriparesis ([facility])   . Spasticity   . Spinal cord tumor   . Wheelchair bound     Past Surgical History:  Procedure Laterality Date  . LAMINECTOMY N/A 06/01/2019   Procedure: Cervical six-seven Laminoplasty, biopsy and resection of intramedullary spinal cord tumor;  Surgeon: [name], MD;  Location: [facility];  Service: Neurosurgery;  Laterality: N/A;  . NO PAST SURGERIES      There were no vitals filed for this visit.   Subjective Assessment - 02/15/20 1536    Subjective No new complaints. No falls.    Limitations Standing;Walking;House hold activities    How long can you sit comfortably? no issues    How long can you stand comfortably? <5 min    How long can you walk comfortably? <0.25 mile    Patient Stated Goals Progress walking    Currently in Pain? Yes    Pain Score 6     Pain Location Leg    Pain Orientation Right;Left    Pain Descriptors / Indicators  Aching;Stabbing;Tingling;Shooting    Pain Type Chronic pain;Neuropathic pain    Pain Onset More than a month ago    Pain Frequency Intermittent    Aggravating Factors  increased activity, brace wear    Pain Relieving Factors pain meds/patch, CBD cream/oral droplets              [facility] Adult PT Treatment/Exercise - 02/15/20 1538      Transfers   Transfers Sit to Stand;Stand to Sit    Sit to Stand 6: Modified independent (Device/Increase time)    Stand to Sit 6: Modified independent (Device/Increase time)      Ambulation/Gait   Ambulation/Gait Yes    Ambulation/Gait Assistance 5: Supervision;4: Min guard    Ambulation/Gait Assistance Details around gym with session    Assistive device Straight cane;Other (Comment)   Bioness   Gait Pattern Step-through pattern;Lateral hip instability;Narrow base of support    Ambulation Surface Level;Indoor      Neuro Re-ed    Neuro Re-ed Details  concurrent with Bioness to bil LE"s; gait with cane working on scanning all directions randomly, sudden stops/starts and speed changes with min guard assist for safety; next to counter for UE support as needed: in gait mode had pt perform side stepping left<>right while  in a squat position for 4 laps with min guard assist, then fwd/bwd diagonal stepping while in squat position for 4 laps each way, min guard assist for safety.      Acupuncturist Location bilat anterior tib and Designer, television/film set Action for increased muscle activation and strengthening    Electrical Stimulation Parameters refer to tablet 1 for adjusted parameters; quick fit electrodes    Electrical Stimulation Goals Strength;Neuromuscular facilitation                 PT Short Term Goals - 02/15/20 1556      PT SHORT TERM GOAL #1   Title = LTG             PT Long Term Goals - 02/15/20 1556      PT LONG TERM GOAL #1   Title Patient will demo >54/56 on berg balance scale to  improve functional balance and reduce fall risk (Cert for 8 weeks; LTG set at 4 weeks.  Reset if needed after 4 weeks)    Baseline 02/13/20: 51/56    Time 4    Period Weeks    Status Revised    Target Date 03/16/20      PT LONG TERM GOAL #2   Title Patient will be able to ambulate >1000 feet outside with LRAD and bilat GRAFO to improve short distance community ambulation.    Baseline 02/13/20: 730 feet with cane on indoor/outdoor paved surfaces with min guard assist for safety, improved just not to goal level    Time 4    Period Weeks    Status Revised    Target Date 03/16/20      PT LONG TERM GOAL #3   Title Pt will improve gait velocity with LRAD and bilat GRAFO's to >/= 3.6 ft/sec    Baseline 3.36 with cane    Time 4    Period Weeks    Status New    Target Date 03/16/20      PT LONG TERM GOAL #4   Title Pt will demonstrate ability to negotiate curb, ramp and 12 stairs with cane, bilat GRAFO's safely Mod I    Time 4    Period Weeks    Status Revised    Target Date 03/16/20                 Plan - 02/15/20 1538    Clinical Impression Statement Today's skilled session continued to focus on gait and strengthening concurrent with Bioness to bil LE's. No issues noted or reported with session. Discussed with pt/primary PT about potential of starting Aquatic sessions with new cert. Pt was in agreement with starting aquatics in additon to land appt's. The pt is progressing toward goals and should benefit from continued PT to progress toward unmet goals.    Personal Factors and Comorbidities Age;Past/Current Experience;Comorbidity 2;Time since onset of injury/illness/exacerbation;Transportation    Examination-Activity Limitations Bathing;Lift;Squat;Stairs;Stand;Transfers    Examination-Participation Restrictions Cleaning;Community Activity;Driving;Laundry;Occupation;Shop;Yard Work    Rehab Potential Fair    PT Frequency 2x / week    PT Duration 6 weeks    PT Treatment/Interventions  ADLs/Self Care Home Management;Gait training;Stair training;Functional mobility training;Therapeutic activities;Therapeutic exercise;Balance training;Neuromuscular re-education;Manual techniques;Wheelchair mobility training;Patient/family education;Passive range of motion;Energy conservation;Vestibular;Visual/perceptual remediation/compensation;Joint Manipulations;Biofeedback;Electrical Stimulation    PT Next Visit Plan When wearing AFO - focus on strengthening especially glute med, balance for narrow BOS and SLS, gait negotiating down stairs, compliant surfaces.  With Bioness -  gait on treadmill; gait outside as able, stairs, LE strengthening. check on aquatic schedule (has been discussed with Vinnie [facility]).    PT Home Exercise Plan Access Code: [email or web address]; [email or web address]. plus heel raises, and HS curls verbally added. Pt stated able to recall these without new handouts.    Consulted and Agree with Plan of Care Patient           Patient will benefit from skilled therapeutic intervention in order to improve the following deficits and impairments:  Abnormal gait,Decreased activity tolerance,Decreased balance,Decreased cognition,Decreased mobility,Decreased endurance,Decreased coordination,Decreased range of motion,Decreased strength,Dizziness,Difficulty walking,Impaired sensation,Impaired tone,Impaired UE functional use,Postural dysfunction,Pain  Visit Diagnosis: Muscle weakness (generalized)  Other abnormalities of gait and mobility  Unsteadiness on feet  Difficulty in walking, not elsewhere classified  Other disturbances of skin sensation  Other lack of coordination     Problem List Patient Active Problem List   Diagnosis Date Noted  . Incomplete paraplegia ([place]) 11/08/2019  . Foot drop, bilateral 11/08/2019  . Spasticity 08/18/2019  . Chronic pain syndrome 08/18/2019  . Nerve pain 08/18/2019  . Clonus 07/21/2019  . Spinal cord tumor   . Pain   . Transaminitis   . Leukocytosis   .  Neurogenic bowel   . Postoperative pain   . 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 Acute incomplete quadriplegia ([facility])   . 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 Numbness 05/10/2019  . Weakness of left lower extremity 05/10/2019  . 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 Visual disturbance 05/10/2019    Willow Ora, PTA, [facility] Neuro [facility] [address] [place], [place] [address] [phone] 02/16/20, 5:10 PM   Name: Jacob Lambert MRN: [id number] Date of Birth: August 08, 1985

## 2020-02-19 ENCOUNTER — Encounter: Payer: Self-pay | Admitting: *Deleted

## 2020-02-20 ENCOUNTER — Encounter: Payer: Self-pay | Admitting: Physical Therapy

## 2020-02-20 ENCOUNTER — Other Ambulatory Visit: Payer: Self-pay

## 2020-02-20 ENCOUNTER — Ambulatory Visit: Payer: BC Managed Care – PPO | Admitting: Physical Therapy

## 2020-02-20 DIAGNOSIS — M6281 Muscle weakness (generalized): Secondary | ICD-10-CM | POA: Diagnosis not present

## 2020-02-20 DIAGNOSIS — R278 Other lack of coordination: Secondary | ICD-10-CM | POA: Diagnosis not present

## 2020-02-20 DIAGNOSIS — R2689 Other abnormalities of gait and mobility: Secondary | ICD-10-CM | POA: Diagnosis not present

## 2020-02-20 DIAGNOSIS — R2681 Unsteadiness on feet: Secondary | ICD-10-CM

## 2020-02-20 DIAGNOSIS — R262 Difficulty in walking, not elsewhere classified: Secondary | ICD-10-CM

## 2020-02-20 DIAGNOSIS — R208 Other disturbances of skin sensation: Secondary | ICD-10-CM | POA: Diagnosis not present

## 2020-02-20 NOTE — Therapy (Signed)
[facility] [address] [place], [place], [address] Phone: [phone]   Fax:  [phone]  Physical Therapy Treatment  Patient Details  Name: Jacob Lambert MRN: [id number] Date of Birth: 1985-09-15 Referring Provider (PT): Dr. Courtney Heys   Encounter Date: 02/20/2020   PT End of Session - 02/20/20 1537    Visit Number 40    Number of Visits 55    Date for PT Re-Evaluation 04/15/20    Authorization Type BCBS - VL: 30 PT/OT combined    Authorization - Visit Number 12    Authorization - Number of Visits 30    PT Start Time 4097    PT Stop Time 1615    PT Time Calculation (min) 42 min    Equipment Utilized During Treatment Other (comment);Gait belt   bil AFO's   Activity Tolerance Patient tolerated treatment well    Behavior During Therapy WFL for tasks assessed/performed           Past Medical History:  Diagnosis Date  . Anxiety   . Clonus   . Depression   . Headache   . Pneumonia    "walking"  . Quadriplegia and quadriparesis ([place])   . Spasticity   . Spinal cord tumor   . Wheelchair bound     Past Surgical History:  Procedure Laterality Date  . LAMINECTOMY N/A 06/01/2019   Procedure: Cervical six-seven Laminoplasty, biopsy and resection of intramedullary spinal cord tumor;  Surgeon: [name], MD;  Location: [facility];  Service: Neurosurgery;  Laterality: N/A;  . NO PAST SURGERIES      There were no vitals filed for this visit.   Subjective Assessment - 02/20/20 1536    Subjective No new complaints. No falls. See multiple doctors over next several weeks about eyes and urologist about bladder issues.    Limitations Standing;Walking;House hold activities    How long can you sit comfortably? no issues    How long can you stand comfortably? <5 min    How long can you walk comfortably? <0.25 mile    Patient Stated Goals Progress walking    Currently in Pain? Yes    Pain Location Leg     Pain Orientation Right;Left    Pain Descriptors / Indicators Aching;Stabbing;Tingling;Shooting    Pain Type Chronic pain;Neuropathic pain    Pain Onset More than a month ago    Pain Frequency Intermittent    Aggravating Factors  increased activity, brace wear    Pain Relieving Factors pain meds/patch, CBD cream/oral droplets               [facility] Adult PT Treatment/Exercise - 02/20/20 1539      Transfers   Transfers Sit to Stand;Stand to Sit    Sit to Stand 6: Modified independent (Device/Increase time)    Stand to Sit 6: Modified independent (Device/Increase time)      Ambulation/Gait   Ambulation/Gait Yes    Ambulation/Gait Assistance 5: Supervision    Assistive device Straight cane;Other (Comment)   bil AFO's   Gait Pattern Step-through pattern;Lateral hip instability;Narrow base of support    Ambulation Surface Level;Indoor      Neuro Re-ed    Neuro Re-ed Details  for balance/muscle re-ed: gait with cane/braces working on speed changes and scanning all directions with min guard assist/supervision. then had pt self tossing small ball during gait while scanning all directions randomly with min guard assist.  Balance Exercises - 02/20/20 1609      Balance Exercises: Standing   Rockerboard Anterior/posterior;Lateral;Head turns;EC;30 seconds;Other reps (comment);Limitations    Rockerboard Limitations on balance board in both ways with no UE support, touch to bars as needed: holding the board steady for EC 30 sec's x 3 reps, progressing to EC head movements left<>right, up<>down for ~10 reps. min assist for balance with cues on posture and weigh shifting to assist with balance.    Tandem Gait Forward;Retro;Intermittent upper extremity support;Foam/compliant surface;3 reps;Limitations    Tandem Gait Limitations on blue foam beam for 3 laps wiht cues on posture, step placement and to use bars as needed for balance. min assist needed for balance.    Sidestepping  Foam/compliant support;3 reps;Limitations    Sidestepping Limitations on blue foam beam for 3 laps toward each direction with cues on posture and step height/length. min guard to min assist with occasional touch to bars for balance.               PT Short Term Goals - 02/15/20 1556      PT SHORT TERM GOAL #1   Title = LTG             PT Long Term Goals - 02/15/20 1556      PT LONG TERM GOAL #1   Title Patient will demo >54/56 on berg balance scale to improve functional balance and reduce fall risk (Cert for 8 weeks; LTG set at 4 weeks.  Reset if needed after 4 weeks)    Baseline 02/13/20: 51/56    Time 4    Period Weeks    Status Revised    Target Date 03/16/20      PT LONG TERM GOAL #2   Title Patient will be able to ambulate >1000 feet outside with LRAD and bilat GRAFO to improve short distance community ambulation.    Baseline 02/13/20: 730 feet with cane on indoor/outdoor paved surfaces with min guard assist for safety, improved just not to goal level    Time 4    Period Weeks    Status Revised    Target Date 03/16/20      PT LONG TERM GOAL #3   Title Pt will improve gait velocity with LRAD and bilat GRAFO's to >/= 3.6 ft/sec    Baseline 3.36 with cane    Time 4    Period Weeks    Status New    Target Date 03/16/20      PT LONG TERM GOAL #4   Title Pt will demonstrate ability to negotiate curb, ramp and 12 stairs with cane, bilat GRAFO's safely Mod I    Time 4    Period Weeks    Status Revised    Target Date 03/16/20                 Plan - 02/20/20 1538    Clinical Impression Statement Today''s skilled session continued to focus on gait/balance with bil braces/cane with no issues noted or reported in session. The pt is making steady progress toward goals and should benefit from continued PT to progress toward unmet goals.    Personal Factors and Comorbidities Age;Past/Current Experience;Comorbidity 2;Time since onset of  injury/illness/exacerbation;Transportation    Examination-Activity Limitations Bathing;Lift;Squat;Stairs;Stand;Transfers    Examination-Participation Restrictions Cleaning;Community Activity;Driving;Laundry;Occupation;Shop;Yard Work    Rehab Potential Good    PT Frequency 2x / week    PT Duration 8 weeks    PT Treatment/Interventions ADLs/Self Care Home Management;Gait training;Stair training;Functional  mobility training;Therapeutic activities;Therapeutic exercise;Balance training;Neuromuscular re-education;Patient/family education;Passive range of motion;Energy conservation;Electrical Stimulation;DME Instruction;Orthotic Fit/Training    PT Next Visit Plan When wearing AFO - focus on strengthening especially glute med, balance for narrow BOS and SLS, gait negotiating down stairs, compliant surfaces.  With Bioness - gait on treadmill; gait outside as able, stairs, LE strengthening    PT Home Exercise Plan Access Code: [email or web address]; [email or web address]. plus heel raises, and HS curls verbally added. Pt stated able to recall these without new handouts.    Consulted and Agree with Plan of Care Patient           Patient will benefit from skilled therapeutic intervention in order to improve the following deficits and impairments:  Abnormal gait,Decreased activity tolerance,Decreased balance,Decreased mobility,Decreased endurance,Decreased coordination,Decreased strength,Difficulty walking,Impaired sensation,Impaired tone,Postural dysfunction,Pain  Visit Diagnosis: Muscle weakness (generalized)  Other abnormalities of gait and mobility  Unsteadiness on feet  Difficulty in walking, not elsewhere classified     Problem List Patient Active Problem List   Diagnosis Date Noted  . Incomplete paraplegia ([place]) 11/08/2019  . Foot drop, bilateral 11/08/2019  . Spasticity 08/18/2019  . Chronic pain syndrome 08/18/2019  . Nerve pain 08/18/2019  . Clonus 07/21/2019  . Spinal cord tumor   . Pain   .  Transaminitis   . Leukocytosis   . Neurogenic bowel   . Postoperative pain   . 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 Acute incomplete quadriplegia ([facility])   . 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 Numbness 05/10/2019  . Weakness of left lower extremity 05/10/2019  . 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 Visual disturbance 05/10/2019    Willow Ora, PTA, [facility] Outpatient Neuro [facility] [address] [place], [place] [address] [phone] 02/20/20, 4:39 PM   Name: Jacob Lambert MRN: [id number] Date of Birth: [birth year]

## 2020-02-22 ENCOUNTER — Other Ambulatory Visit: Payer: Self-pay

## 2020-02-22 ENCOUNTER — Encounter: Payer: Self-pay | Admitting: Physical Therapy

## 2020-02-22 ENCOUNTER — Ambulatory Visit: Payer: BC Managed Care – PPO | Admitting: Physical Therapy

## 2020-02-22 DIAGNOSIS — R262 Difficulty in walking, not elsewhere classified: Secondary | ICD-10-CM

## 2020-02-22 DIAGNOSIS — R278 Other lack of coordination: Secondary | ICD-10-CM

## 2020-02-22 DIAGNOSIS — R2681 Unsteadiness on feet: Secondary | ICD-10-CM | POA: Diagnosis not present

## 2020-02-22 DIAGNOSIS — M6281 Muscle weakness (generalized): Secondary | ICD-10-CM | POA: Diagnosis not present

## 2020-02-22 DIAGNOSIS — R208 Other disturbances of skin sensation: Secondary | ICD-10-CM | POA: Diagnosis not present

## 2020-02-22 DIAGNOSIS — R2689 Other abnormalities of gait and mobility: Secondary | ICD-10-CM

## 2020-02-23 NOTE — Therapy (Signed)
[facility] [address] [place], [place], [address] Phone: [phone]   Fax:  [phone]  Physical Therapy Treatment  Patient Details  Name: Jacob Lambert MRN: [id number] Date of Birth: January 09, 1985 Referring Provider (PT): Dr. Courtney Heys   Encounter Date: 02/22/2020   PT End of Session - 02/22/20 1151    Visit Number 41    Number of Visits 76    Date for PT Re-Evaluation 04/15/20    Authorization Type BCBS - VL: 30 PT/OT combined    Authorization - Visit Number 13    Authorization - Number of Visits 30    PT Start Time 0712    PT Stop Time 1230    PT Time Calculation (min) 42 min    Equipment Utilized During Treatment Other (comment);Gait belt   bil Bioness   Activity Tolerance Patient tolerated treatment well    Behavior During Therapy WFL for tasks assessed/performed           Past Medical History:  Diagnosis Date  . Anxiety   . Clonus   . Depression   . Headache   . Pneumonia    "walking"  . Quadriplegia and quadriparesis ([place])   . Spasticity   . Spinal cord tumor   . Wheelchair bound     Past Surgical History:  Procedure Laterality Date  . LAMINECTOMY N/A 06/01/2019   Procedure: Cervical six-seven Laminoplasty, biopsy and resection of intramedullary spinal cord tumor;  Surgeon: [name], MD;  Location: [facility];  Service: Neurosurgery;  Laterality: N/A;  . NO PAST SURGERIES      There were no vitals filed for this visit.   Subjective Assessment - 02/22/20 1630    Subjective No new complaints. No falls. See multiple doctors over next several weeks about eyes and urologist about bladder issues.    Limitations Standing;Walking;House hold activities    How long can you sit comfortably? no issues    How long can you stand comfortably? <5 min    How long can you walk comfortably? <0.25 mile    Patient Stated Goals Progress walking    Currently in Pain? Yes    Pain Score 5     Pain  Location Leg    Pain Orientation Right;Left    Pain Descriptors / Indicators Aching;Stabbing;Tingling;Shooting    Pain Type Chronic pain;Neuropathic pain    Pain Onset More than a month ago    Aggravating Factors  increased activity, brace wear    Pain Relieving Factors pain meds/patch, CBD cream/oral droplets               [facility] Adult PT Treatment/Exercise - 02/22/20 1152      Transfers   Transfers Sit to Stand;Stand to Sit    Sit to Stand 6: Modified independent (Device/Increase time)    Stand to Sit 6: Modified independent (Device/Increase time)      Ambulation/Gait   Ambulation/Gait Yes    Ambulation/Gait Assistance 5: Supervision;4: Min guard    Ambulation/Gait Assistance Details around the gym with session concurrent with Bioness to bil LEs.    Assistive device Straight cane;Other (Comment)   bil Bioness   Gait Pattern Step-through pattern;Lateral hip instability;Narrow base of support    Ambulation Surface Level;Indoor      High Level Balance   High Level Balance Activities Side stepping;Backward walking    High Level Balance Comments concurrent with Bioness to bil LE's next to counter top with straight cane for 3-4  laps each. cues on posture, step length and weight shifitng.      Knee/Hip Exercises: Aerobic   Tread [place] 1.4 mph x 8 minutes with bilat Bioness in gait mode and bilat UE support with focus on endurance, upright posture and full step length and stance time each LE. Min guard assist for safety.      Theme park manager bilat anterior tib and Engineer, building services for muscle activation and strengthening    Electrical Stimulation Parameters refert to tablet 1 for adjusted parameters; quick fit electrode    Electrical Stimulation Goals Strength;Neuromuscular facilitation                PT Short Term Goals - 02/15/20 1556      PT SHORT TERM GOAL #1   Title = LTG             PT Long Term  Goals - 02/15/20 1556      PT LONG TERM GOAL #1   Title Patient will demo >54/56 on berg balance scale to improve functional balance and reduce fall risk (Cert for 8 weeks; LTG set at 4 weeks.  Reset if needed after 4 weeks)    Baseline 02/13/20: 51/56    Time 4    Period Weeks    Status Revised    Target Date 03/16/20      PT LONG TERM GOAL #2   Title Patient will be able to ambulate >1000 feet outside with LRAD and bilat GRAFO to improve short distance community ambulation.    Baseline 02/13/20: 730 feet with cane on indoor/outdoor paved surfaces with min guard assist for safety, improved just not to goal level    Time 4    Period Weeks    Status Revised    Target Date 03/16/20      PT LONG TERM GOAL #3   Title Pt will improve gait velocity with LRAD and bilat GRAFO's to >/= 3.6 ft/sec    Baseline 3.36 with cane    Time 4    Period Weeks    Status New    Target Date 03/16/20      PT LONG TERM GOAL #4   Title Pt will demonstrate ability to negotiate curb, ramp and 12 stairs with cane, bilat GRAFO's safely Mod I    Time 4    Period Weeks    Status Revised    Target Date 03/16/20                 Plan - 02/22/20 1152    Clinical Impression Statement Today's skilled session continued with use of Bioness to bil LE's with gait and balance activities. Pt with 2 episodes of right knee buckling with gait on treadmill with pt self correcting with UE support. No other issues noted or reported in session. The pt is progressing toward goals and should benefit from continued PT to progress toward unmet LTGs.    Personal Factors and Comorbidities Age;Past/Current Experience;Comorbidity 2;Time since onset of injury/illness/exacerbation;Transportation    Examination-Activity Limitations Bathing;Lift;Squat;Stairs;Stand;Transfers    Examination-Participation Restrictions Cleaning;Community Activity;Driving;Laundry;Occupation;Shop;Yard Work    Rehab Potential Good    PT Frequency 2x / week     PT Duration 8 weeks    PT Treatment/Interventions ADLs/Self Care Home Management;Gait training;Stair training;Functional mobility training;Therapeutic activities;Therapeutic exercise;Balance training;Neuromuscular re-education;Patient/family education;Passive range of motion;Energy conservation;Electrical Stimulation;DME Instruction;Orthotic Fit/Training    PT Next Visit Plan When wearing AFO - focus on strengthening especially  glute med, balance for narrow BOS and SLS, gait negotiating down stairs, compliant surfaces.  With Bioness - gait on treadmill; gait outside as able, stairs, LE strengthening    PT Home Exercise Plan Access Code: [email or web address]; [email or web address]. plus heel raises, and HS curls verbally added. Pt stated able to recall these without new handouts.    Consulted and Agree with Plan of Care Patient           Patient will benefit from skilled therapeutic intervention in order to improve the following deficits and impairments:  Abnormal gait,Decreased activity tolerance,Decreased balance,Decreased mobility,Decreased endurance,Decreased coordination,Decreased strength,Difficulty walking,Impaired sensation,Impaired tone,Postural dysfunction,Pain  Visit Diagnosis: Muscle weakness (generalized)  Other abnormalities of gait and mobility  Unsteadiness on feet  Difficulty in walking, not elsewhere classified  Other disturbances of skin sensation  Other lack of coordination     Problem List Patient Active Problem List   Diagnosis Date Noted  . Incomplete paraplegia ([place]) 11/08/2019  . Foot drop, bilateral 11/08/2019  . Spasticity 08/18/2019  . Chronic pain syndrome 08/18/2019  . Nerve pain 08/18/2019  . Clonus 07/21/2019  . Spinal cord tumor   . Pain   . Transaminitis   . Leukocytosis   . Neurogenic bowel   . Postoperative pain   . 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 Acute incomplete quadriplegia ([facility])   . 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 Numbness 05/10/2019  .  Weakness of left lower extremity 05/10/2019  . 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 Visual disturbance 05/10/2019    Willow Ora, PTA, [facility] Outpatient Neuro [facility] [address], [place], [place] [address] [phone] 02/23/20, 1:09 PM   Name: Jacob Lambert MRN: [id number] Date of Birth: 1985-03-15

## 2020-02-27 ENCOUNTER — Ambulatory Visit: Payer: BC Managed Care – PPO | Admitting: Physical Therapy

## 2020-02-27 ENCOUNTER — Encounter: Payer: Self-pay | Admitting: Physical Therapy

## 2020-02-27 ENCOUNTER — Other Ambulatory Visit: Payer: Self-pay

## 2020-02-27 DIAGNOSIS — R2681 Unsteadiness on feet: Secondary | ICD-10-CM

## 2020-02-27 DIAGNOSIS — R278 Other lack of coordination: Secondary | ICD-10-CM

## 2020-02-27 DIAGNOSIS — R2689 Other abnormalities of gait and mobility: Secondary | ICD-10-CM

## 2020-02-27 DIAGNOSIS — M6281 Muscle weakness (generalized): Secondary | ICD-10-CM | POA: Diagnosis not present

## 2020-02-27 DIAGNOSIS — R208 Other disturbances of skin sensation: Secondary | ICD-10-CM

## 2020-02-27 DIAGNOSIS — R262 Difficulty in walking, not elsewhere classified: Secondary | ICD-10-CM | POA: Diagnosis not present

## 2020-02-27 NOTE — Therapy (Signed)
[facility] [address] [place], [place], [address] Phone: [phone]   Fax:  [phone]  Physical Therapy Treatment  Patient Details  Name: Jacob Lambert MRN: [id number] Date of Birth: 1985-10-09 Referring Provider (PT): Dr. Courtney Heys   Encounter Date: 02/27/2020   PT End of Session - 02/27/20 1737    Visit Number 42    Number of Visits 55    Date for PT Re-Evaluation 04/15/20    Authorization Type BCBS - VL: 30 PT/OT combined    Authorization - Visit Number 14    Authorization - Number of Visits 30    PT Start Time 1100    PT Stop Time 1145    PT Time Calculation (min) 45 min    Equipment Utilized During Treatment Other (comment)   ankle buoyancy cuffs, pool noodle, water step   Activity Tolerance Patient tolerated treatment well    Behavior During Therapy [facility] for tasks assessed/performed           Past Medical History:  Diagnosis Date  . Anxiety   . Clonus   . Depression   . Headache   . Pneumonia    "walking"  . Quadriplegia and quadriparesis ([place])   . Spasticity   . Spinal cord tumor   . Wheelchair bound     Past Surgical History:  Procedure Laterality Date  . LAMINECTOMY N/A 06/01/2019   Procedure: Cervical six-seven Laminoplasty, biopsy and resection of intramedullary spinal cord tumor;  Surgeon: [name], MD;  Location: [facility];  Service: Neurosurgery;  Laterality: N/A;  . NO PAST SURGERIES      There were no vitals filed for this visit.   Subjective Assessment - 02/27/20 1735    Subjective Denies any changes since last visit in clinic.  Is going out of town in a few weeks and will have [place] maneuver in airport.    Limitations Standing;Walking;House hold activities    How long can you sit comfortably? no issues    How long can you stand comfortably? <5 min    How long can you walk comfortably? <0.25 mile    Patient Stated Goals Progress walking    Currently in Pain? Yes     Pain Score 5     Pain Location Leg    Pain Orientation Right;Left    Pain Descriptors / Indicators Aching;Stabbing;Tingling;Shooting    Pain Type Chronic pain;Neuropathic pain    Pain Onset More than a month ago    Pain Frequency Intermittent    Aggravating Factors  increased activity    Pain Relieving Factors pain meds    Multiple Pain Sites No           Aquatic therapy at [facility]   Patient seen for aquatic therapy today. Treatment took place in water 3.6-4.34feet deep depending upon activity. Pt entered and exited the pool viastepnegotiation with use of bil. Hand rails for support.; 6 steps - approx. 4" height.  Pt performed with SBA entering pool but CGA-min assist when exiting due to fatigue.  Pt performed bil. Hamstrings/heel cord stretch (runner's stretch) - 1 rep each leg for 30 sec hold  Gait training focused on initial gait with pt holding onto pool edge in 3.6-4.8 ft of water.  Progressed to using pool noodle for bil UE support with PTA assisting to support noodle then without UE support with close supervision of PTA.  Performed side stepping and backward ambulation as well.  Performed water  walking above both horizontal and vertical in pool.    Pt sat on bench in pool - LAQ's 15each leg ; seated hip flexion 15 reps each leg; sit to stand from bench without UE support with CGAx 10 reps.  Performed alternating LAQ with opposite arm raise x 10 reps each.  Pt performed standing hip flexion, abduction, and hamstring curl 10 reps each legwith viscosity of water for resistance. Pt using 1-2 UE support at pool edge.  Added ankle buoyancy cuffs for increased resistance and performed an additional 10 reps of above.  Squats bil LE at pool edge x 10 reps.  Using water step in approx 4.5 ft of water, pt performed stepping onto/back off of step, stepping off/back up onto step and side stepping lateral on/off step-10 reps each direction with  1-intermittent UE support.  Pt requires buoyancy of water for support and for joint off loading for reduced pain with weight bearing exercise; pt requires viscosity of water for resistance for strengthening; Gait training with less support is able to be performed in the water with increased safety with reduced fall risk than is able to be performed on land.      PT Short Term Goals - 02/15/20 1556      PT SHORT TERM GOAL #1   Title = LTG             PT Long Term Goals - 02/15/20 1556      PT LONG TERM GOAL #1   Title Patient will demo >54/56 on berg balance scale to improve functional balance and reduce fall risk (Cert for 8 weeks; LTG set at 4 weeks.  Reset if needed after 4 weeks)    Baseline 02/13/20: 51/56    Time 4    Period Weeks    Status Revised    Target Date 03/16/20      PT LONG TERM GOAL #2   Title Patient will be able to ambulate >1000 feet outside with LRAD and bilat GRAFO to improve short distance community ambulation.    Baseline 02/13/20: 730 feet with cane on indoor/outdoor paved surfaces with min guard assist for safety, improved just not to goal level    Time 4    Period Weeks    Status Revised    Target Date 03/16/20      PT LONG TERM GOAL #3   Title Pt will improve gait velocity with LRAD and bilat GRAFO's to >/= 3.6 ft/sec    Baseline 3.36 with cane    Time 4    Period Weeks    Status New    Target Date 03/16/20      PT LONG TERM GOAL #4   Title Pt will demonstrate ability to negotiate curb, ramp and 12 stairs with cane, bilat GRAFO's safely Mod I    Time 4    Period Weeks    Status Revised    Target Date 03/16/20                 Plan - 02/27/20 1738    Clinical Impression Statement Skilled aquatic session focused on gait, balance, ROM and strengthening.  Pt has varying discomfort depending on activity and worked through these during session.  Cont per poc.    Personal Factors and Comorbidities Age;Past/Current Experience;Comorbidity  2;Time since onset of injury/illness/exacerbation;Transportation    Examination-Activity Limitations Bathing;Lift;Squat;Stairs;Stand;Transfers    Examination-Participation Restrictions Cleaning;Community Activity;Driving;Laundry;Occupation;Shop;Yard Work    Rehab Potential Good    PT Frequency 2x / week  PT Duration 8 weeks    PT Treatment/Interventions ADLs/Self Care Home Management;Gait training;Stair training;Functional mobility training;Therapeutic activities;Therapeutic exercise;Balance training;Neuromuscular re-education;Patient/family education;Passive range of motion;Energy conservation;Electrical Stimulation;DME Instruction;Orthotic Fit/Training    PT Next Visit Plan When wearing AFO - focus on strengthening especially glute med, balance for narrow BOS and SLS, gait negotiating down stairs, compliant surfaces.  With Bioness - gait on treadmill; gait outside as able, stairs, LE strengthening    PT Home Exercise Plan Access Code: [email or web address]; [email or web address]. plus heel raises, and HS curls verbally added. Pt stated able to recall these without new handouts.    Consulted and Agree with Plan of Care Patient           Patient will benefit from skilled therapeutic intervention in order to improve the following deficits and impairments:  Abnormal gait,Decreased activity tolerance,Decreased balance,Decreased mobility,Decreased endurance,Decreased coordination,Decreased strength,Difficulty walking,Impaired sensation,Impaired tone,Postural dysfunction,Pain  Visit Diagnosis: Muscle weakness (generalized)  Other abnormalities of gait and mobility  Unsteadiness on feet  Difficulty in walking, not elsewhere classified  Other disturbances of skin sensation  Other lack of coordination     Problem List Patient Active Problem List   Diagnosis Date Noted  . Incomplete paraplegia ([place]) 11/08/2019  . Foot drop, bilateral 11/08/2019  . Spasticity 08/18/2019  . Chronic pain syndrome 08/18/2019  .  Nerve pain 08/18/2019  . Clonus 07/21/2019  . Spinal cord tumor   . Pain   . Transaminitis   . Leukocytosis   . Neurogenic bowel   . Postoperative pain   . 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 Acute incomplete quadriplegia ([facility])   . 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 Numbness 05/10/2019  . Weakness of left lower extremity 05/10/2019  . 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 Visual disturbance 05/10/2019    Narda Bonds, PTA [facility] 02/27/20 5:46 PM Phone: [phone] Fax: [facility] [address] [place], [place], [address] Phone: [phone]   Fax:  [phone]  Name: Alexandar Weisenberger MRN: [id number] Date of Birth: 1985/03/20

## 2020-02-28 ENCOUNTER — Ambulatory Visit: Payer: BC Managed Care – PPO | Admitting: Physical Therapy

## 2020-02-28 DIAGNOSIS — G8250 Quadriplegia, unspecified: Secondary | ICD-10-CM | POA: Diagnosis not present

## 2020-03-01 ENCOUNTER — Other Ambulatory Visit: Payer: Self-pay

## 2020-03-01 ENCOUNTER — Ambulatory Visit: Payer: BC Managed Care – PPO | Admitting: Physical Therapy

## 2020-03-01 ENCOUNTER — Encounter: Payer: Self-pay | Admitting: Physical Therapy

## 2020-03-01 DIAGNOSIS — R262 Difficulty in walking, not elsewhere classified: Secondary | ICD-10-CM

## 2020-03-01 DIAGNOSIS — R2689 Other abnormalities of gait and mobility: Secondary | ICD-10-CM

## 2020-03-01 DIAGNOSIS — R208 Other disturbances of skin sensation: Secondary | ICD-10-CM

## 2020-03-01 DIAGNOSIS — R278 Other lack of coordination: Secondary | ICD-10-CM | POA: Diagnosis not present

## 2020-03-01 DIAGNOSIS — R2681 Unsteadiness on feet: Secondary | ICD-10-CM

## 2020-03-01 DIAGNOSIS — M6281 Muscle weakness (generalized): Secondary | ICD-10-CM | POA: Diagnosis not present

## 2020-03-01 NOTE — Therapy (Signed)
[facility] [address] [place], [place], [address] Phone: [phone]   Fax:  [phone]  Physical Therapy Treatment  Patient Details  Name: Jacob Lambert MRN: [id number] Date of Birth: November 10, 1985 Referring Provider (PT): Dr. Courtney Heys   Encounter Date: 03/01/2020   PT End of Session - 03/01/20 1537    Visit Number 43    Number of Visits 55    Date for PT Re-Evaluation 04/15/20    Authorization Type BCBS - VL: 30 PT/OT combined    Authorization - Visit Number 15    Authorization - Number of Visits 30    PT Start Time 2426    PT Stop Time 1615    PT Time Calculation (min) 42 min    Equipment Utilized During Treatment Other (comment)   ankle buoyancy cuffs, pool noodle, water step   Activity Tolerance Patient tolerated treatment well    Behavior During Therapy WFL for tasks assessed/performed           Past Medical History:  Diagnosis Date  . Anxiety   . Clonus   . Depression   . Headache   . Pneumonia    "walking"  . Quadriplegia and quadriparesis ([place])   . Spasticity   . Spinal cord tumor   . Wheelchair bound     Past Surgical History:  Procedure Laterality Date  . LAMINECTOMY N/A 06/01/2019   Procedure: Cervical six-seven Laminoplasty, biopsy and resection of intramedullary spinal cord tumor;  Surgeon: [name], MD;  Location: [name];  Service: Neurosurgery;  Laterality: N/A;  . NO PAST SURGERIES      There were no vitals filed for this visit.   Subjective Assessment - 03/01/20 1534    Subjective Reports being very sore after first aquatic therapy session. Does have concerns there was not automatic door from locker room to pool and no seating in the locker room.    Limitations Standing;Walking;House hold activities    How long can you sit comfortably? no issues    How long can you stand comfortably? <5 min    How long can you walk comfortably? <0.25 mile    Patient Stated Goals  Progress walking    Currently in Pain? Yes    Pain Location Leg    Pain Orientation Right;Left    Pain Descriptors / Indicators Aching;Stabbing;Tingling;Shooting    Pain Type Chronic pain;Neuropathic pain    Pain Onset More than a month ago    Pain Frequency Intermittent    Aggravating Factors  increased activity    Pain Relieving Factors pain meds                  [facility] Adult PT Treatment/Exercise - 03/01/20 1604      Transfers   Transfers Sit to Stand;Stand to Sit    Sit to Stand 6: Modified independent (Device/Increase time)    Stand to Sit 6: Modified independent (Device/Increase time)      Ambulation/Gait   Ambulation/Gait Yes    Ambulation/Gait Assistance 5: Supervision;4: Min guard    Ambulation Distance (Feet) 80 Feet   x2, plus around gym   Assistive device Straight cane;Other (Comment)   bil Bioness   Gait Pattern Step-through pattern;Lateral hip instability;Narrow base of support    Ambulation Surface Level;Indoor      High Level Balance   High Level Balance Comments concurrent wtih Bioness to bil LE's:      Knee/Hip Exercises: Aerobic   [place]  1.5 mph x 8 minutes with bilat Bioness in gait mode and bilat UE support with focus on endurance, upright posture and full step length and stance time each LE. Min guard assist for safety as bil knees buckling at random times with pt self correcting.      Acupuncturist Location bilat anterior tib and Engineer, building services for increased muscle activation/stengthening    Electrical Stimulation Parameters refer to tablet 1 for adjusted parameters; quick fit electrodes    Electrical Stimulation Goals Strength;Neuromuscular facilitation                PT Short Term Goals - 02/15/20 1556      PT SHORT TERM GOAL #1   Title = LTG             PT Long Term Goals - 02/15/20 1556      PT LONG TERM GOAL #1   Title Patient will demo >54/56 on berg balance  scale to improve functional balance and reduce fall risk (Cert for 8 weeks; LTG set at 4 weeks.  Reset if needed after 4 weeks)    Baseline 02/13/20: 51/56    Time 4    Period Weeks    Status Revised    Target Date 03/16/20      PT LONG TERM GOAL #2   Title Patient will be able to ambulate >1000 feet outside with LRAD and bilat GRAFO to improve short distance community ambulation.    Baseline 02/13/20: 730 feet with cane on indoor/outdoor paved surfaces with min guard assist for safety, improved just not to goal level    Time 4    Period Weeks    Status Revised    Target Date 03/16/20      PT LONG TERM GOAL #3   Title Pt will improve gait velocity with LRAD and bilat GRAFO's to >/= 3.6 ft/sec    Baseline 3.36 with cane    Time 4    Period Weeks    Status New    Target Date 03/16/20      PT LONG TERM GOAL #4   Title Pt will demonstrate ability to negotiate curb, ramp and 12 stairs with cane, bilat GRAFO's safely Mod I    Time 4    Period Weeks    Status Revised    Target Date 03/16/20                 Plan - 03/01/20 1537    Clinical Impression Statement Today's skilled sesison continued with use of Bioness to bil LE's with gait and balance activities. Bil knee buckling occuring at times radomly with session with pt self correcting, Min guard assist for safety.    Personal Factors and Comorbidities Age;Past/Current Experience;Comorbidity 2;Time since onset of injury/illness/exacerbation;Transportation    Examination-Activity Limitations Bathing;Lift;Squat;Stairs;Stand;Transfers    Examination-Participation Restrictions Cleaning;Community Activity;Driving;Laundry;Occupation;Shop;Yard Work    Rehab Potential Good    PT Frequency 2x / week    PT Duration 8 weeks    PT Treatment/Interventions ADLs/Self Care Home Management;Gait training;Stair training;Functional mobility training;Therapeutic activities;Therapeutic exercise;Balance training;Neuromuscular  re-education;Patient/family education;Passive range of motion;Energy conservation;Electrical Stimulation;DME Instruction;Orthotic Fit/Training    PT Next Visit Plan When wearing AFO - focus on strengthening especially glute med, balance for narrow BOS and SLS, gait negotiating down stairs, compliant surfaces.  With Bioness - gait on treadmill; gait outside as able, stairs, LE strengthening    PT Home Exercise Plan Access Code: [email or web address]. plus heel raises, and HS curls verbally added. Pt stated able to recall these without new handouts.    Consulted and Agree with Plan of Care Patient           Patient will benefit from skilled therapeutic intervention in order to improve the following deficits and impairments:  Abnormal gait,Decreased activity tolerance,Decreased balance,Decreased mobility,Decreased endurance,Decreased coordination,Decreased strength,Difficulty walking,Impaired sensation,Impaired tone,Postural dysfunction,Pain  Visit Diagnosis: Muscle weakness (generalized)  Other abnormalities of gait and mobility  Unsteadiness on feet  Difficulty in walking, not elsewhere classified  Other disturbances of skin sensation     Problem List Patient Active Problem List   Diagnosis Date Noted  . Incomplete paraplegia ([place]) 11/08/2019  . Foot drop, bilateral 11/08/2019  . Spasticity 08/18/2019  . Chronic pain syndrome 08/18/2019  . Nerve pain 08/18/2019  . Clonus 07/21/2019  . Spinal cord tumor   . Pain   . Transaminitis   . Leukocytosis   . Neurogenic bowel   . Postoperative pain   . 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 Acute incomplete quadriplegia ([facility])   . 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 Numbness 05/10/2019  . Weakness of left lower extremity 05/10/2019  . 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 Visual disturbance 05/10/2019    Willow Ora, PTA, [facility] Outpatient Neuro [facility] [address], [place], [place] [address] [phone] 03/01/20, 5:27 PM    Name: Jacob Lambert MRN: [id number] Date of Birth: 12-Apr-1985

## 2020-03-04 ENCOUNTER — Encounter: Payer: Self-pay | Admitting: Physical Therapy

## 2020-03-04 ENCOUNTER — Ambulatory Visit: Payer: BC Managed Care – PPO | Admitting: Physical Therapy

## 2020-03-04 ENCOUNTER — Other Ambulatory Visit: Payer: Self-pay

## 2020-03-04 DIAGNOSIS — R2689 Other abnormalities of gait and mobility: Secondary | ICD-10-CM | POA: Diagnosis not present

## 2020-03-04 DIAGNOSIS — R208 Other disturbances of skin sensation: Secondary | ICD-10-CM | POA: Diagnosis not present

## 2020-03-04 DIAGNOSIS — R262 Difficulty in walking, not elsewhere classified: Secondary | ICD-10-CM

## 2020-03-04 DIAGNOSIS — R2681 Unsteadiness on feet: Secondary | ICD-10-CM

## 2020-03-04 DIAGNOSIS — M6281 Muscle weakness (generalized): Secondary | ICD-10-CM | POA: Diagnosis not present

## 2020-03-04 DIAGNOSIS — R278 Other lack of coordination: Secondary | ICD-10-CM | POA: Diagnosis not present

## 2020-03-04 NOTE — Therapy (Signed)
[facility] [address] [place], [place], [address] Phone: [phone]   Fax:  [phone]  Physical Therapy Treatment  Patient Details  Name: Jacob Lambert MRN: [id number] Date of Birth: 1985/11/22 Referring Provider (PT): Dr. Courtney Heys   Encounter Date: 03/04/2020   PT End of Session - 03/04/20 1344    Visit Number 19    Number of Visits 55    Date for PT Re-Evaluation 04/15/20    Authorization Type BCBS - VL: 30 PT/OT combined    Authorization - Visit Number 16    Authorization - Number of Visits 30    PT Start Time 1230    PT Stop Time 1315    PT Time Calculation (min) 45 min    Equipment Utilized During Treatment Other (comment)   ankle buoyancy cuffs, pool noodle, water step   Activity Tolerance Patient tolerated treatment well    Behavior During Therapy [facility] for tasks assessed/performed           Past Medical History:  Diagnosis Date  . Anxiety   . Clonus   . Depression   . Headache   . Pneumonia    "walking"  . Quadriplegia and quadriparesis ([place])   . Spasticity   . Spinal cord tumor   . Wheelchair bound     Past Surgical History:  Procedure Laterality Date  . LAMINECTOMY N/A 06/01/2019   Procedure: Cervical six-seven Laminoplasty, biopsy and resection of intramedullary spinal cord tumor;  Surgeon: [name], MD;  Location: [facility];  Service: Neurosurgery;  Laterality: N/A;  . NO PAST SURGERIES      There were no vitals filed for this visit.   Subjective Assessment - 03/04/20 1342    Subjective Pt reports being "appropriately" sore after last aquatic session.    Limitations Standing;Walking;House hold activities    How long can you sit comfortably? no issues    How long can you stand comfortably? <5 min    How long can you walk comfortably? <0.25 mile    Patient Stated Goals Progress walking    Currently in Pain? Other (Comment)   bil LE's depending on activity   Pain Onset  More than a month ago           Aquatic therapy at [facility]   Patient seen for aquatic therapy today. Treatment took place in water 3.6-4.45feet deep depending upon activity. Pt entered and exited the pool viastepnegotiation with use of bil. Hand rails for support.; 6 steps - approx. 4" height.  Pt performed with SBA entering pool but CGA-min assist when exiting due to fatigue.  Pt performed bil. Hamstrings/heel cord stretch (runner's stretch) - 1 rep each leg for 30 sec hold  Gait training performed in 3.6-4.8 ft of water. Performed forward, backward and side stepping. Performed water walking above both horizontal and vertical in pool. Side step squats with arm bar bells and close supervision.   Pt sat on bench in pool - LAQ's 15each leg ; seated hip flexion 15 repseach leg; sit to stand from bench without UE support with CGAx 10 reps. Performed alternating LAQ with opposite arm raise x 10 reps each.  Pt performed standing hip flexion, hip extension, abduction, and hamstring curl 10 reps each legankle buoyancy cuffs and withviscosity of water for resistance. Pt using 1-2 UE support at pool edge.  Assisted pt to place pool noodle under 1 LE and perform assisted hip flexion/extension x 10 reps bil  with PTA providing help with noodle.  Squats bil LE at pool edge x 15 reps without UE support  Seated on bench and performed sit<>stand x 10.  Performed LAQ, hip flexion, bicycling, hip abd, LE braiding with semi seated on bench.  Performed with bil LE's and 15 reps each.  Using water step in approx 4.5 ft of water, pt performed stepping onto/back off of step, stepping off/back up onto step and side stepping lateral on/off step-10 reps each direction with 1-intermittent UE support.  Pt requires buoyancy of water for support and for joint off loading for reduced pain with weight bearing exercise; pt requires viscosity of water for resistance for  strengthening; Gait training with less support is able to be performed in the water with increased safety with reduced fall risk than is able to beperformed on land.      PT Short Term Goals - 02/15/20 1556      PT SHORT TERM GOAL #1   Title = LTG             PT Long Term Goals - 02/15/20 1556      PT LONG TERM GOAL #1   Title Patient will demo >54/56 on berg balance scale to improve functional balance and reduce fall risk (Cert for 8 weeks; LTG set at 4 weeks.  Reset if needed after 4 weeks)    Baseline 02/13/20: 51/56    Time 4    Period Weeks    Status Revised    Target Date 03/16/20      PT LONG TERM GOAL #2   Title Patient will be able to ambulate >1000 feet outside with LRAD and bilat GRAFO to improve short distance community ambulation.    Baseline 02/13/20: 730 feet with cane on indoor/outdoor paved surfaces with min guard assist for safety, improved just not to goal level    Time 4    Period Weeks    Status Revised    Target Date 03/16/20      PT LONG TERM GOAL #3   Title Pt will improve gait velocity with LRAD and bilat GRAFO's to >/= 3.6 ft/sec    Baseline 3.36 with cane    Time 4    Period Weeks    Status New    Target Date 03/16/20      PT LONG TERM GOAL #4   Title Pt will demonstrate ability to negotiate curb, ramp and 12 stairs with cane, bilat GRAFO's safely Mod I    Time 4    Period Weeks    Status Revised    Target Date 03/16/20                 Plan - 03/04/20 1345    Clinical Impression Statement Skilled session focused on strength, ROM, balance and gait. Pt appropriately challenged with activities in pool. Uses UE's to assist with balance in water.    Personal Factors and Comorbidities Age;Past/Current Experience;Comorbidity 2;Time since onset of injury/illness/exacerbation;Transportation    Examination-Activity Limitations Bathing;Lift;Squat;Stairs;Stand;Transfers    Examination-Participation Restrictions Cleaning;Community  Activity;Driving;Laundry;Occupation;Shop;Yard Work    Rehab Potential Good    PT Frequency 2x / week    PT Duration 8 weeks    PT Treatment/Interventions ADLs/Self Care Home Management;Gait training;Stair training;Functional mobility training;Therapeutic activities;Therapeutic exercise;Balance training;Neuromuscular re-education;Patient/family education;Passive range of motion;Energy conservation;Electrical Stimulation;DME Instruction;Orthotic Fit/Training    PT Next Visit Plan When wearing AFO - focus on strengthening especially glute med, balance for narrow BOS and SLS, gait negotiating down stairs,  compliant surfaces.  With Bioness - gait on treadmill; gait outside as able, stairs, LE strengthening    PT Home Exercise Plan Access Code: [email or web address]; [email or web address]. plus heel raises, and HS curls verbally added. Pt stated able to recall these without new handouts.    Consulted and Agree with Plan of Care Patient           Patient will benefit from skilled therapeutic intervention in order to improve the following deficits and impairments:  Abnormal gait,Decreased activity tolerance,Decreased balance,Decreased mobility,Decreased endurance,Decreased coordination,Decreased strength,Difficulty walking,Impaired sensation,Impaired tone,Postural dysfunction,Pain  Visit Diagnosis: Muscle weakness (generalized)  Other abnormalities of gait and mobility  Unsteadiness on feet  Difficulty in walking, not elsewhere classified  Other disturbances of skin sensation     Problem List Patient Active Problem List   Diagnosis Date Noted  . Incomplete paraplegia ([place]) 11/08/2019  . Foot drop, bilateral 11/08/2019  . Spasticity 08/18/2019  . Chronic pain syndrome 08/18/2019  . Nerve pain 08/18/2019  . Clonus 07/21/2019  . Spinal cord tumor   . Pain   . Transaminitis   . Leukocytosis   . Neurogenic bowel   . Postoperative pain   . 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 Acute incomplete quadriplegia ([facility])   .  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 Numbness 05/10/2019  . Weakness of left lower extremity 05/10/2019  . 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 Visual disturbance 05/10/2019    Narda Bonds, PTA [facility] 03/04/20 1:53 PM Phone: [phone] Fax: [facility] [address] [place], [place], [address] Phone: [phone]   Fax:  [phone]  Name: Grant Swager MRN: [id number] Date of Birth: 1985-12-05

## 2020-03-05 DIAGNOSIS — H43312 Vitreous membranes and strands, left eye: Secondary | ICD-10-CM | POA: Diagnosis not present

## 2020-03-05 DIAGNOSIS — H5213 Myopia, bilateral: Secondary | ICD-10-CM | POA: Diagnosis not present

## 2020-03-05 DIAGNOSIS — H18623 Keratoconus, unstable, bilateral: Secondary | ICD-10-CM | POA: Diagnosis not present

## 2020-03-06 ENCOUNTER — Telehealth: Payer: Self-pay

## 2020-03-06 ENCOUNTER — Ambulatory Visit: Payer: BC Managed Care – PPO | Admitting: Physical Therapy

## 2020-03-06 ENCOUNTER — Encounter: Payer: Self-pay | Admitting: Physical Medicine and Rehabilitation

## 2020-03-06 ENCOUNTER — Other Ambulatory Visit: Payer: Self-pay

## 2020-03-06 ENCOUNTER — Encounter
Payer: BC Managed Care – PPO | Attending: Physical Medicine and Rehabilitation | Admitting: Physical Medicine and Rehabilitation

## 2020-03-06 VITALS — BP 105/71 | HR 81 | Temp 99.1°F | Ht 70.0 in | Wt 155.4 lb

## 2020-03-06 DIAGNOSIS — G8250 Quadriplegia, unspecified: Secondary | ICD-10-CM | POA: Diagnosis not present

## 2020-03-06 DIAGNOSIS — G894 Chronic pain syndrome: Secondary | ICD-10-CM | POA: Insufficient documentation

## 2020-03-06 DIAGNOSIS — R252 Cramp and spasm: Secondary | ICD-10-CM | POA: Insufficient documentation

## 2020-03-06 DIAGNOSIS — M21372 Foot drop, left foot: Secondary | ICD-10-CM | POA: Insufficient documentation

## 2020-03-06 DIAGNOSIS — M21371 Foot drop, right foot: Secondary | ICD-10-CM | POA: Insufficient documentation

## 2020-03-06 MED ORDER — BACLOFEN 10 MG PO TABS: 10.0000 mg | ORAL_TABLET | Freq: Four times a day (QID) | ORAL | 1 refills | Status: AC

## 2020-03-06 MED ORDER — GABAPENTIN 600 MG PO TABS
600.0000 mg | ORAL_TABLET | Freq: Three times a day (TID) | ORAL | 1 refills | Status: DC
Start: 2020-03-06 — End: 2020-12-06

## 2020-03-06 MED ORDER — TIZANIDINE HCL 2 MG PO TABS: 2.0000 mg | ORAL_TABLET | Freq: Two times a day (BID) | ORAL | 1 refills | Status: AC | PRN

## 2020-03-06 MED ORDER — DANTROLENE SODIUM 100 MG PO CAPS
100.0000 mg | ORAL_CAPSULE | Freq: Three times a day (TID) | ORAL | 1 refills | Status: DC
Start: 2020-03-06 — End: 2020-12-06

## 2020-03-06 NOTE — Telephone Encounter (Signed)
Pharmacist with [facility] Called to confirm if Peggye Ley should be taking Tizanidine and Baclofen together?   Office note reviewed:  Per office visit note on 03/06/2020 by Dr. Dagoberto Ligas:   1.   Will try Zanaflex/Tizanidine- 2 mg 2x/day AS NEEDED - so only take when /if needs it.   2. Con't Baclofen 10 mg QID and Dantrolene 100 mg TID- for spasticity  Above information shared with Levada Dy Warehouse manager ). Call back phone [phone].

## 2020-03-06 NOTE — Patient Instructions (Signed)
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 Here for f/u.      1. Will try Zanaflex/Tizanidine- 2 mg 2x/day AS NEEDED - so only take when /if needs it.   2. Con't Baclofen 10 mg QID and Dantrolene 100 mg TID- for spasticity  3. Con't Gabapentin 600 mg TID for nerve pain.   4.  Pt meets criteria for disability.  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   5. Spasticity is the main limiter at this point in addition to obvious SCI.   6. Doesn't want to add more nerve pain meds at this time- I agree- he's on a lot of meds  7. Going to Urology for UTI Sx's and pain with ejaculation.    8. F/U in 3 months- double appointment- if still in [place].

## 2020-03-06 NOTE — Progress Notes (Signed)
Subjective:    Patient ID: Jacob Lambert, adult    DOB: [birth year], 35 y.o.   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 Here for f/u.       Psoriasis acting up- annoying more than anything    Seeing [facility] urology- tomorrow-  Thinks has minor UTI- pain with urination- every time. Also burning sensation down L leg with ejaculation. Seeing them for it. Nothing improves it.   The worst burning shoots down LLE and L arm sometimes. Right after ejaculation.   Winter has been hell-  Spasticity has been worse over the entire winter.   Coping with it is better, but spasms are really bad.  Night time into morning is brutal.  Once up and about is OK.  Kicking and horrible stiffness.  Is able to get OOB faster than 5 months ago.  Mostly OK until  PT- and clonus really kicks in.   Is OK with current spasticity regimen.  Spasticity worse last month, so indicative of having UTI.   Working with [employer] from PT.   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   Will need scleral lens- semi hard contact-  Meets for fitting tomorrow.   Coldness in B/L feet- ice cold. To feel and to touch, per pt.  Lack of sensation of B/L toes.  Toes curling-    Feels is also getting atrophy in knees Walking with AFOs. Short distances  Social Hx: Going to [place] for 10 days next week Wants to see how it does.  Taking power w/c with him to [place], [place].  Going to event- that has accommodations.    Pain Inventory Average Pain 5 Pain Right Now 4 My pain is constant, sharp, burning, dull, stabbing, tingling and aching  LOCATION OF PAIN  Knees, head, knees, right thigh, elbows, left chest wall, feet  BOWEL Number of stools per week: 6 Oral laxative use Miralax Type of laxative  As  above Enema or suppository use No  History of colostomy No  Incontinent No   BLADDER Normal In and out cath, frequency yes (rare) Able to self cath Yes  Bladder incontinence No  Frequent urination Yes  Leakage with coughing No  Difficulty starting stream No  Incomplete bladder emptying No    Mobility use a cane use a walker how many minutes can you walk? 8 mind ability to climb steps?  yes do you drive?  no use a wheelchair transfers alone Do you have any goals in this area?  yes  Function I need assistance with the following:  meal prep, household duties and shopping Do you have any goals in this area?  yes  Neuro/Psych weakness numbness tremor tingling trouble walking spasms dizziness depression  Prior Studies Any changes since last visit?  yes CT/MRI MRI IN [facility] 2021  Physicians involved in your care Any changes since last visit?  no   History reviewed. No pertinent family history. Social History   Socioeconomic History  . Marital status: Single    Spouse name: Not on file  . Number of children: Not on file  . Years of education: Not on file  . Highest education level: Not on file  Occupational History  . Not on file  Tobacco Use  . Smoking status: Former Research scientist (life sciences)  .  Smokeless tobacco: Never Used  . Tobacco comment: none in years   Vaping Use  . Vaping Use: Never used  Substance and Sexual Activity  . Alcohol use: Yes    Comment: 2 bottles of wine per week, Maybe 4 - 8 shots aweek  . Drug use: Not Currently    Comment: CDB  . Sexual activity: Not Currently  Other Topics Concern  . Not on file  Social History Narrative   Lives with parents   Caffeine use: minimum- 3 cups per day (on average 6-10 cups per day)   Left handed    Social Determinants of Health   Financial Resource Strain: Not on file  Food Insecurity: Not on file  Transportation Needs: Not on file  Physical Activity: Not on file  Stress: Not on file  Social Connections:  Not on file   Past Surgical History:  Procedure Laterality Date  . LAMINECTOMY N/A 06/01/2019   Procedure: Cervical six-seven Laminoplasty, biopsy and resection of intramedullary spinal cord tumor;  Surgeon: [name], MD;  Location: [facility];  Service: Neurosurgery;  Laterality: N/A;  . NO PAST SURGERIES     Past Medical History:  Diagnosis Date  . Anxiety   . Clonus   . Depression   . Headache   . Pneumonia    "walking"  . Quadriplegia and quadriparesis ([place])   . Spasticity   . Spinal cord tumor   . Wheelchair bound    BP 105/71   Pulse 81   Temp 99.1 F (37.3 C)   Ht 5\' 10"  (1.778 m)   Wt 155 lb 6.4 oz (70.5 kg)   SpO2 97%   BMI 22.30 kg/m   Opioid Risk Score:   Fall Risk Score:  `1  Depression screen PHQ 2/9  Depression screen [facility] 2/9 01/26/2020 12/06/2019 10/02/2019 08/18/2019  Decreased Interest 0 1 1 2   Down, Depressed, Hopeless 2 1 1 2   PHQ - 2 Score 2 2 2 4   Altered sleeping 2 - - -  Tired, decreased energy 0 - - -  Change in appetite 0 - - -  Feeling bad or failure about yourself  0 - - -  Trouble concentrating 0 - - -  Moving slowly or fidgety/restless 0 - - -  Suicidal thoughts 0 - - -  PHQ-9 Score 4 - - -   Review of Systems  Musculoskeletal: Positive for gait problem.  Neurological: Positive for weakness.  All other systems reviewed and are negative.      Objective:   Physical Exam Awake, alert, appropriate, using single point cane with balancing "quad tip". NAD Has B/L AFOs.  Hands- cannot get into claw- PIPS/DIPs don't really bend well.  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 Here for f/u.  1. Will try Zanaflex/Tizanidine- 2 mg 2x/day AS NEEDED - so only take when /if needs it.   2. Con't Baclofen 10 mg QID and Dantrolene 100 mg TID- for spasticity  3. Con't Gabapentin 600 mg TID for nerve pain.   4.  Pt meets criteria for disability.  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   5. Spasticity is the main limiter at this point in addition to obvious SCI.   6. Doesn't want to add more nerve pain meds at this time- I agree0 he's on a lot of meds  7. Going to Urology for UTI Sx's and pain with ejaculation.    8. F/U in 3 months  I spent a total of 35 minutes on visit- as detailed above.

## 2020-03-07 DIAGNOSIS — N5312 Painful ejaculation: Secondary | ICD-10-CM | POA: Diagnosis not present

## 2020-03-07 DIAGNOSIS — N411 Chronic prostatitis: Secondary | ICD-10-CM | POA: Diagnosis not present

## 2020-03-08 ENCOUNTER — Other Ambulatory Visit: Payer: Self-pay

## 2020-03-08 ENCOUNTER — Encounter: Payer: Self-pay | Admitting: Physical Therapy

## 2020-03-08 ENCOUNTER — Ambulatory Visit: Payer: BC Managed Care – PPO | Attending: Family Medicine | Admitting: Physical Therapy

## 2020-03-08 DIAGNOSIS — R293 Abnormal posture: Secondary | ICD-10-CM | POA: Insufficient documentation

## 2020-03-08 DIAGNOSIS — M6281 Muscle weakness (generalized): Secondary | ICD-10-CM | POA: Diagnosis not present

## 2020-03-08 DIAGNOSIS — R278 Other lack of coordination: Secondary | ICD-10-CM

## 2020-03-08 DIAGNOSIS — R262 Difficulty in walking, not elsewhere classified: Secondary | ICD-10-CM

## 2020-03-08 DIAGNOSIS — R208 Other disturbances of skin sensation: Secondary | ICD-10-CM

## 2020-03-08 DIAGNOSIS — R2681 Unsteadiness on feet: Secondary | ICD-10-CM | POA: Diagnosis present

## 2020-03-08 DIAGNOSIS — R2689 Other abnormalities of gait and mobility: Secondary | ICD-10-CM

## 2020-03-08 NOTE — Therapy (Addendum)
[facility] [address] [place], [place], [address] Phone: [phone]   Fax:  [phone]  Physical Therapy Treatment  Patient Details  Name: Jacob Lambert MRN: [id number] Date of Birth: 1985-09-23 Referring Provider (PT): Dr. Courtney Heys   Encounter Date: 03/08/2020   PT End of Session - 03/08/20 1238    Visit Number 45    Number of Visits 55    Date for PT Re-Evaluation 04/15/20    Authorization Type BCBS - VL: 30 PT/OT combined    Authorization - Visit Number 17    Authorization - Number of Visits 30    PT Start Time 1230    PT Stop Time 1315    PT Time Calculation (min) 45 min    Equipment Utilized During Treatment Other (comment)   bil Bioness   Activity Tolerance Patient tolerated treatment well    Behavior During Therapy [facility] for tasks assessed/performed           Past Medical History:  Diagnosis Date  . Anxiety   . Clonus   . Depression   . Headache   . Pneumonia    "walking"  . Quadriplegia and quadriparesis ([place])   . Spasticity   . Spinal cord tumor   . Wheelchair bound     Past Surgical History:  Procedure Laterality Date  . LAMINECTOMY N/A 06/01/2019   Procedure: Cervical six-seven Laminoplasty, biopsy and resection of intramedullary spinal cord tumor;  Surgeon: [name], MD;  Location: [place];  Service: Neurosurgery;  Laterality: N/A;  . NO PAST SURGERIES      There were no vitals filed for this visit.   Subjective Assessment - 03/08/20 1232    Subjective Reports the aquatic sessions are going well. Still getting "really" sore, appropriately so. Did have a fall when he tripped over his suitcase while packing in a dark room. Denies any injuries. Was able to get self up. Leaves for [place], [place] on Monday for [place] [employer]. Plans to use two different SCI gyms while there as a way of testing out PT clinics there in prep for possible relocation to there.  Also looking into Medical doctors there. Renting a manual wheelchair while there. Taking his power chair, rollator, two cane and his braces.    Limitations Standing;Walking;House hold activities    How long can you sit comfortably? no issues    How long can you stand comfortably? <5 min    How long can you walk comfortably? <0.25 mile    Patient Stated Goals Progress walking    Currently in Pain? Yes    Pain Location Leg    Pain Orientation Right;Left    Pain Descriptors / Indicators Aching;Stabbing;Tingling;Shooting    Pain Type Chronic pain;Neuropathic pain    Pain Onset More than a month ago    Pain Frequency Intermittent    Aggravating Factors  increased activity    Pain Relieving Factors pain meds                  [facility] Adult PT Treatment/Exercise - 03/08/20 1239      Transfers   Transfers Sit to Stand;Stand to Sit    Sit to Stand 6: Modified independent (Device/Increase time)    Stand to Sit 6: Modified independent (Device/Increase time)      Ambulation/Gait   Ambulation/Gait Yes    Ambulation/Gait Assistance 5: Supervision;4: Min guard    Assistive device Straight cane;Other (Comment)  Gait Pattern Step-through pattern;Lateral hip instability;Narrow base of support    Ambulation Surface Level;Indoor      High Level Balance   High Level Balance Activities Negotitating around obstacles;Negotiating over obstacles    High Level Balance Comments concurrent with Bioness to bil LE's: obstacle course of hoola hoop<>3 bolsters<>hoola hoop with walking around hoola hoops and stepping over bolsters for 4 laps while tossing hackey sack with cane, min guard to min assist for balance.      Neuro Re-ed    Neuro Re-ed Details  for balance/muscle re-ed concurrent with Bioness to bil LE's: gait while self tossing hackey sack, pt switching cane sides half way, going through narrow spaces and with turns, min guard assist; then gait on track working on scanning all directions, sudden  stops/starts with cane, min guard assist.      Knee/Hip Exercises: Aerobic   Tread Mill 1.5 mph x 8 minutes with bilat Bioness in gait mode and bilat UE support with focus on endurance, upright posture and full step length and stance time each LE. Min guard assist for safety with one episode of right knee buckling needing min assist to correct. other episodes of bil knee buckling pt was able to self correct.      Acupuncturist Location bilat anterior tib and Engineer, building services for increased muscle activation/strengthening    Electrical Stimulation Parameters refer to tablet 1 for adjusted parameters; quick fit electrodes.    Electrical Stimulation Goals Strength;Neuromuscular facilitation                    PT Short Term Goals - 02/15/20 1556      PT SHORT TERM GOAL #1   Title = LTG             PT Long Term Goals - 02/15/20 1556      PT LONG TERM GOAL #1   Title Patient will demo >54/56 on berg balance scale to improve functional balance and reduce fall risk (Cert for 8 weeks; LTG set at 4 weeks.  Reset if needed after 4 weeks)    Baseline 02/13/20: 51/56    Time 4    Period Weeks    Status Revised    Target Date 03/16/20      PT LONG TERM GOAL #2   Title Patient will be able to ambulate >1000 feet outside with LRAD and bilat GRAFO to improve short distance community ambulation.    Baseline 02/13/20: 730 feet with cane on indoor/outdoor paved surfaces with min guard assist for safety, improved just not to goal level    Time 4    Period Weeks    Status Revised    Target Date 03/16/20      PT LONG TERM GOAL #3   Title Pt will improve gait velocity with LRAD and bilat GRAFO's to >/= 3.6 ft/sec    Baseline 3.36 with cane    Time 4    Period Weeks    Status New    Target Date 03/16/20      PT LONG TERM GOAL #4   Title Pt will demonstrate ability to negotiate curb, ramp and 12 stairs with cane, bilat GRAFO's  safely Mod I    Time 4    Period Weeks    Status Revised    Target Date 03/16/20  03/08/20 1238  Plan  Clinical Impression Statement Today's skilled session continued with use of Bioness to bil LE's concurrent with gait and balance activities. Pt with one episode of right knee buckling on treadmill needing assist to correct, otherwise pt able to self correct any buckling episodes. No other issues reported or noted in session. The pt is progressing toward goals and should benefit from continued PT to progress toward unmet goals.  Personal Factors and Comorbidities Age;Past/Current Experience;Comorbidity 2;Time since onset of injury/illness/exacerbation;Transportation  Examination-Activity Limitations Bathing;Lift;Squat;Stairs;Stand;Transfers  Examination-Participation Restrictions Cleaning;Community Activity;Driving;Laundry;Occupation;Shop;Yard Work  Pt will benefit from skilled therapeutic intervention in order to improve on the following deficits Abnormal gait;Decreased activity tolerance;Decreased balance;Decreased mobility;Decreased endurance;Decreased coordination;Decreased strength;Difficulty walking;Impaired sensation;Impaired tone;Postural dysfunction;Pain  Rehab Potential Good  PT Frequency 2x / week  PT Duration 8 weeks  PT Treatment/Interventions ADLs/Self Care Home Management;Gait training;Stair training;Functional mobility training;Therapeutic activities;Therapeutic exercise;Balance training;Neuromuscular re-education;Patient/family education;Passive range of motion;Energy conservation;Electrical Stimulation;DME Instruction;Orthotic Fit/Training  PT Next Visit Plan Gwenyth Bouillon - when B returns from being out of town please check LTG and update baselines.  No recert needed at this time, just need to update goals.  On land- alternate between use of Bioness and braces. With Bioness continue to work on treadmill, dyanmic activiites with cane. with braces- gait on  various surfaces, balance on complaint surfaces, dynamic gait.  PT Home Exercise Plan Access Code: [email or web address]; [email or web address]. plus heel raises, and HS curls verbally added. Pt stated able to recall these without new handouts.  Consulted and Agree with Plan of Care Patient    Patient will benefit from skilled therapeutic intervention in order to improve the following deficits and impairments:  Abnormal gait,Decreased activity tolerance,Decreased balance,Decreased mobility,Decreased endurance,Decreased coordination,Decreased strength,Difficulty walking,Impaired sensation,Impaired tone,Postural dysfunction,Pain  Visit Diagnosis: Muscle weakness (generalized)  Other abnormalities of gait and mobility  Unsteadiness on feet  Difficulty in walking, not elsewhere classified  Other disturbances of skin sensation  Other lack of coordination     Problem List Patient Active Problem List   Diagnosis Date Noted  . Incomplete paraplegia ([place]) 11/08/2019  . Foot drop, bilateral 11/08/2019  . Spasticity 08/18/2019  . Chronic pain syndrome 08/18/2019  . Nerve pain 08/18/2019  . Clonus 07/21/2019  . Spinal cord tumor   . Pain   . Transaminitis   . Leukocytosis   . Neurogenic bowel   . Postoperative pain   . Ependymoma ([place]) 06/08/2019  . Acute incomplete quadriplegia ([facility])   . Spinal cord ependymoma ([place]) 05/23/2019  . Numbness 05/10/2019  . Weakness of left lower extremity 05/10/2019  . 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 Visual disturbance 05/10/2019    Willow Ora, PTA, [facility] Outpatient Neuro [facility] [address], [place], [place] [address] [phone] 03/08/20, 4:31 PM   Name: Jacob Lambert MRN: [id number] Date of Birth: 1985/03/26

## 2020-03-14 ENCOUNTER — Telehealth (HOSPITAL_COMMUNITY): Payer: Self-pay

## 2020-03-19 ENCOUNTER — Telehealth: Payer: Self-pay

## 2020-03-19 NOTE — Telephone Encounter (Signed)
Attempted to reach by phone x 2 today per MD Vaslow to assist pt with getting upcoming MRI scheduled.

## 2020-03-27 ENCOUNTER — Ambulatory Visit: Payer: BC Managed Care – PPO

## 2020-03-27 ENCOUNTER — Other Ambulatory Visit: Payer: Self-pay

## 2020-03-27 DIAGNOSIS — R208 Other disturbances of skin sensation: Secondary | ICD-10-CM | POA: Diagnosis not present

## 2020-03-27 DIAGNOSIS — R2689 Other abnormalities of gait and mobility: Secondary | ICD-10-CM | POA: Diagnosis not present

## 2020-03-27 DIAGNOSIS — R262 Difficulty in walking, not elsewhere classified: Secondary | ICD-10-CM | POA: Diagnosis not present

## 2020-03-27 DIAGNOSIS — M6281 Muscle weakness (generalized): Secondary | ICD-10-CM | POA: Diagnosis not present

## 2020-03-27 DIAGNOSIS — R2681 Unsteadiness on feet: Secondary | ICD-10-CM | POA: Diagnosis not present

## 2020-03-27 DIAGNOSIS — G8250 Quadriplegia, unspecified: Secondary | ICD-10-CM | POA: Diagnosis not present

## 2020-03-27 DIAGNOSIS — R293 Abnormal posture: Secondary | ICD-10-CM

## 2020-03-27 DIAGNOSIS — R278 Other lack of coordination: Secondary | ICD-10-CM | POA: Diagnosis not present

## 2020-03-27 NOTE — Therapy (Signed)
[facility] [address] [place], [place], [address] Phone: [phone]   Fax:  [phone]  Physical Therapy Treatment  Patient Details  Name: Jacob Lambert MRN: [id number] Date of Birth: [birth year] Referring Provider (PT): Dr. Courtney Heys   Encounter Date: 03/27/2020   PT End of Session - 03/27/20 1325    Visit Number 54    Number of Visits 55    Date for PT Re-Evaluation 04/15/20    Authorization Type BCBS - VL: 30 PT/OT combined    Authorization - Visit Number 18    Authorization - Number of Visits 30    PT Start Time 0962    PT Stop Time 1400    PT Time Calculation (min) 45 min    Equipment Utilized During Treatment Other (comment)    Activity Tolerance Patient tolerated treatment well    Behavior During Therapy [facility] for tasks assessed/performed           Past Medical History:  Diagnosis Date  . Anxiety   . Clonus   . Depression   . Headache   . Pneumonia    "walking"  . Quadriplegia and quadriparesis ([place])   . Spasticity   . Spinal cord tumor   . Wheelchair bound     Past Surgical History:  Procedure Laterality Date  . LAMINECTOMY N/A 06/01/2019   Procedure: Cervical six-seven Laminoplasty, biopsy and resection of intramedullary spinal cord tumor;  Surgeon: [name], MD;  Location: [facility];  Service: Neurosurgery;  Laterality: N/A;  . NO PAST SURGERIES      There were no vitals filed for this visit.   Subjective Assessment - 03/27/20 1326    Subjective I travelled to [place] last 2 weeks. I used my power chair to get around. I was mostly in chair due to being in crowd, I didn't walk around much. I walked around in the hotel room with cane.    Limitations Standing;Walking;House hold activities    How long can you sit comfortably? no issues    How long can you stand comfortably? <5 min    How long can you walk comfortably? <0.25 mile    Patient Stated Goals Progress walking     Pain Onset More than a month ago              [facility] PT Assessment - 03/27/20 0001      Berg Balance Test   Sit to Stand Able to stand without using hands and stabilize independently    Standing Unsupported Able to stand safely 2 minutes    Sitting with Back Unsupported but Feet Supported on Floor or Stool Able to sit safely and securely 2 minutes    Stand to Sit Sits safely with minimal use of hands    Transfers Able to transfer safely, minor use of hands    Standing Unsupported with Eyes Closed Able to stand 10 seconds safely    Standing Unsupported with Feet Together Able to place feet together independently and stand 1 minute safely    From Standing, Reach Forward with Outstretched Arm Can reach forward >12 cm safely (5")    From Standing Position, Pick up Object from Floor Able to pick up shoe safely and easily    From Standing Position, Turn to Look Behind Over each Shoulder Looks behind from both sides and weight shifts well    Turn 360 Degrees Able to turn 360 degrees safely in 4 seconds or less  Standing Unsupported, Alternately Place Feet on Step/Stool Able to stand independently and safely and complete 8 steps in 20 seconds    Standing Unsupported, One Foot in Front Able to plae foot ahead of the other independently and hold 30 seconds    Standing on One Leg Able to lift leg independently and hold equal to or more than 3 seconds   able to stand on L LE for 3 sec, R LE for 1-2 sec   Total Score 52                  Assessed LTGs.                 PT Short Term Goals - 02/15/20 1556      PT SHORT TERM GOAL #1   Title = LTG             PT Long Term Goals - 03/27/20 1329      PT LONG TERM GOAL #1   Title Patient will demo >54/56 on berg balance scale to improve functional balance and reduce fall risk    Baseline 02/13/20: 51/56; 03/27/20: 52/56    Time 4    Period Weeks    Status Revised    Target Date 04/30/20      PT LONG TERM GOAL #2    Title Patient will be able to ambulate >1000 feet outside with LRAD and bilat GRAFO to improve short distance community ambulation.    Baseline 02/13/20: 730 feet with cane on indoor/outdoor paved surfaces with min guard assist for safety, improved just not to goal level; 03/27/20: with bil GRAFO and st. cane in L hand, CGA for safety, 985' pt rpeorted R knee was burning.    Time 4    Period Weeks    Status Achieved      PT LONG TERM GOAL #3   Title Pt will improve gait velocity with LRAD and bilat GRAFO's to >/= 3.6 ft/sec    Baseline 3.36 with cane; 03/27/20 3.78 ft/sec    Time 4    Period Weeks    Status Achieved      PT LONG TERM GOAL #4   Title Pt will demonstrate ability to negotiate curb, ramp and 12 stairs with cane, bilat GRAFO's safely Mod I    Time 4    Period Weeks    Status Revised    Target Date 04/30/20                 Plan - 03/27/20 1458    Clinical Impression Statement LTG # 2 and 4 met today. Still progressing towards LTG #1 and 4. Patient demonstrates poor balance with tandem stance or SLS. Patient demonstrates very poor stepping strategy during balance where he makes no attempt to take a step. Patient will beenfit from continued PT to improve balance and strength.    Personal Factors and Comorbidities Age;Past/Current Experience;Comorbidity 2;Time since onset of injury/illness/exacerbation;Transportation    Examination-Activity Limitations Bathing;Lift;Squat;Stairs;Stand;Transfers    Examination-Participation Restrictions Cleaning;Community Activity;Driving;Laundry;Occupation;Shop;Yard Work    Rehab Potential Good    PT Frequency 2x / week    PT Duration 8 weeks    PT Treatment/Interventions ADLs/Self Care Home Management;Gait training;Stair training;Functional mobility training;Therapeutic activities;Therapeutic exercise;Balance training;Neuromuscular re-education;Patient/family education;Passive range of motion;Energy conservation;Electrical Stimulation;DME  Instruction;Orthotic Fit/Training    PT Next Visit Plan [place] based PT: continue to work on functional E-stim as appropriate. Work on stepping strategies when outside LOS with balance execises, work on tandem and  SLS balances. Continue to progress strengthening as tolerated. Pt has eye surgery scheduled in April where he anticipates will miss another week of PT. LTG scheduled to expire 04/30/20 to accomodate 2 wks of loss of therapy due to travel and 1 wk of anticipated loss in April.    PT Home Exercise Plan Access Code: [email or web address]; [email or web address]. plus heel raises, and HS curls verbally added. Pt stated able to recall these without new handouts.    Consulted and Agree with Plan of Care Patient           Patient will benefit from skilled therapeutic intervention in order to improve the following deficits and impairments:  Abnormal gait,Decreased activity tolerance,Decreased balance,Decreased mobility,Decreased endurance,Decreased coordination,Decreased strength,Difficulty walking,Impaired sensation,Impaired tone,Postural dysfunction,Pain  Visit Diagnosis: Muscle weakness (generalized)  Other abnormalities of gait and mobility  Unsteadiness on feet  Difficulty in walking, not elsewhere classified  Abnormal posture     Problem List Patient Active Problem List   Diagnosis Date Noted  . Incomplete paraplegia ([place]) 11/08/2019  . Foot drop, bilateral 11/08/2019  . Spasticity 08/18/2019  . Chronic pain syndrome 08/18/2019  . Nerve pain 08/18/2019  . Clonus 07/21/2019  . Spinal cord tumor   . Pain   . Transaminitis   . Leukocytosis   . Neurogenic bowel   . Postoperative pain   . 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 Acute incomplete quadriplegia ([facility])   . 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 Numbness 05/10/2019  . Weakness of left lower extremity 05/10/2019  . 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 Visual disturbance 05/10/2019    Kerrie Pleasure 03/27/2020, 3:02 PM  [facility] [address] [place], [place], [address] Phone: [phone]   Fax:  [phone]  Name: Drayton Tieu MRN: [id number] Date of Birth: 1985/06/05

## 2020-03-29 ENCOUNTER — Other Ambulatory Visit: Payer: Self-pay

## 2020-03-29 ENCOUNTER — Ambulatory Visit: Payer: BC Managed Care – PPO | Admitting: Physical Therapy

## 2020-03-29 ENCOUNTER — Encounter: Payer: Self-pay | Admitting: Physical Therapy

## 2020-03-29 DIAGNOSIS — M6281 Muscle weakness (generalized): Secondary | ICD-10-CM | POA: Diagnosis not present

## 2020-03-29 DIAGNOSIS — R262 Difficulty in walking, not elsewhere classified: Secondary | ICD-10-CM | POA: Diagnosis not present

## 2020-03-29 DIAGNOSIS — R2689 Other abnormalities of gait and mobility: Secondary | ICD-10-CM

## 2020-03-29 DIAGNOSIS — R293 Abnormal posture: Secondary | ICD-10-CM | POA: Diagnosis not present

## 2020-03-29 DIAGNOSIS — R2681 Unsteadiness on feet: Secondary | ICD-10-CM | POA: Diagnosis not present

## 2020-03-29 DIAGNOSIS — R278 Other lack of coordination: Secondary | ICD-10-CM | POA: Diagnosis not present

## 2020-03-29 DIAGNOSIS — R208 Other disturbances of skin sensation: Secondary | ICD-10-CM | POA: Diagnosis not present

## 2020-03-29 NOTE — Therapy (Signed)
[facility] [address] [place], [place], [address] Phone: [phone]   Fax:  [phone]  Physical Therapy Treatment  Patient Details  Name: Jacob Lambert MRN: [id number] Date of Birth: [birth year] Referring Provider (PT): Dr. Courtney Heys   Encounter Date: 03/29/2020   PT End of Session - 03/29/20 1532    Visit Number 23    Number of Visits 55    Date for PT Re-Evaluation 04/15/20    Authorization Type BCBS - VL: 30 PT/OT combined    Authorization - Visit Number 19    Authorization - Number of Visits 30    PT Start Time 7510    PT Stop Time 1455    PT Time Calculation (min) 44 min    Equipment Utilized During Treatment Other (comment)   Bioness   Activity Tolerance Patient tolerated treatment well    Behavior During Therapy WFL for tasks assessed/performed           Past Medical History:  Diagnosis Date  . Anxiety   . Clonus   . Depression   . Headache   . Pneumonia    "walking"  . Quadriplegia and quadriparesis ([place])   . Spasticity   . Spinal cord tumor   . Wheelchair bound     Past Surgical History:  Procedure Laterality Date  . LAMINECTOMY N/A 06/01/2019   Procedure: Cervical six-seven Laminoplasty, biopsy and resection of intramedullary spinal cord tumor;  Surgeon: [name], MD;  Location: [facility];  Service: Neurosurgery;  Laterality: N/A;  . NO PAST SURGERIES      There were no vitals filed for this visit.   Subjective Assessment - 03/29/20 1414    Subjective Aquatic therapy is wonderful - feels better on knees but is a really hard workout.  Having more pain in the knees, especially in R knee.    Limitations Standing;Walking;House hold activities    How long can you sit comfortably? no issues    How long can you stand comfortably? <5 min    How long can you walk comfortably? <0.25 mile    Patient Stated Goals Progress walking    Currently in Pain? Yes    Pain Score 6      Pain Location Knee    Pain Orientation Right;Left    Pain Descriptors / Indicators Burning    Pain Type Neuropathic pain    Pain Onset More than a month ago                             [facility] Adult PT Treatment/Exercise - 03/29/20 1521      Transfers   Transfers Sit to Stand;Stand to [employer] Transfers    Sit to Stand 6: Modified independent (Device/Increase time)    Stand to Sit 6: Modified independent (Device/Increase time)    Stand Pivot Transfers 6: Modified independent (Device/Increase time)      Ambulation/Gait   Ambulation/Gait Yes    Ambulation/Gait Assistance 5: Supervision    Ambulation/Gait Assistance Details ambulating with bilat Bioness functional e-stim with cane with intermittent LOB when fatigued; pt able to recover    Ambulation Distance (Feet) 230 Feet    Assistive device Straight cane    Gait Pattern Step-through pattern;Lateral hip instability;Narrow base of support    Ambulation Surface Level;Indoor    Stairs Yes    Stairs Assistance 4: Min guard    Stairs Assistance Details (indicate cue  type and reason) performed alternating sequence x 2 reps with one UE support on rail and use of Bioness in gait mode to assist with foot clearance and knee stability in stance phase.  Cues to control foot slap when ascending.  No evidence of knee instability when ascending or descending    Stair Management Technique One rail Right;Alternating pattern;Forwards    Number of Stairs 8    Height of Stairs 6      Knee/Hip Exercises: Aerobic   Elliptical level 2.5 resistance x 2.5 minutes with Bioness on cycle mode to facilitate increased ankle DF and knee extension throughout cycle.  Pt fatigued very quickly      Modalities   Modalities Teacher, English as a foreign language Location bilat anterior tib and quads    Electrical Stimulation Action open and closed chain ankle DF and knee extension    Electrical  Stimulation Parameters refer to Tablet 1; quick fit electrodes    Electrical Stimulation Goals Strength;Neuromuscular facilitation;Pain               Balance Exercises - 03/29/20 1527      Balance Exercises: Standing   SLS Eyes open;Foam/compliant surface;Intermittent upper extremity support;5 reps;10 secs;Limitations    SLS Limitations 5 reps per side standing on rockerboard anterior/posterior x 8 second hold with Bioness on in training mode, alternating LE.  Intermittent UE support    Rockerboard Anterior/posterior;EO;10 reps;Intermittent UE support;Limitations    Rockerboard Limitations on black half foam roll with Bioness in training mode, 5 seconds on, 5 off.  When on pt cued to shift weight posteriorly and perform ankle DF, when off pt cued to shift weight anteriorly and perform ankle PF.  Performed 10 reps with UE support > 10 reps without UE support               PT Short Term Goals - 02/15/20 1556      PT SHORT TERM GOAL #1   Title = LTG             PT Long Term Goals - 03/27/20 1329      PT LONG TERM GOAL #1   Title Patient will demo >54/56 on berg balance scale to improve functional balance and reduce fall risk    Baseline 02/13/20: 51/56; 03/27/20: 52/56    Time 4    Period Weeks    Status Revised    Target Date 04/30/20      PT LONG TERM GOAL #2   Title Patient will be able to ambulate >1000 feet outside with LRAD and bilat GRAFO to improve short distance community ambulation.    Baseline 02/13/20: 730 feet with cane on indoor/outdoor paved surfaces with min guard assist for safety, improved just not to goal level; 03/27/20: with bil GRAFO and st. cane in L hand, CGA for safety, 985' pt rpeorted R knee was burning.    Time 4    Period Weeks    Status Achieved      PT LONG TERM GOAL #3   Title Pt will improve gait velocity with LRAD and bilat GRAFO's to >/= 3.6 ft/sec    Baseline 3.36 with cane; 03/27/20 3.78 ft/sec    Time 4    Period Weeks    Status  Achieved      PT LONG TERM GOAL #4   Title Pt will demonstrate ability to negotiate curb, ramp and 12 stairs with cane, bilat GRAFO's safely Mod I  Time 4    Period Weeks    Status Revised    Target Date 04/30/20                 Plan - 03/29/20 1533    Clinical Impression Statement Continued to utilize bilateral Bioness for NMR and strengthening while performing aerobic conditioning on elliptical, stair training and dynamic standing balance training focusing on weight shifting and SLS training.  Pt reported improvement in knee pain when using Bioness.  Pt not able to tolerate as much time on elliptical as compared to treadmill for endurance training.  Will continue with PT for two more weeks and then take one week break due to eye surgery.    Personal Factors and Comorbidities Age;Past/Current Experience;Comorbidity 2;Time since onset of injury/illness/exacerbation;Transportation    Examination-Activity Limitations Bathing;Lift;Squat;Stairs;Stand;Transfers    Examination-Participation Restrictions Cleaning;Community Activity;Driving;Laundry;Occupation;Shop;Yard Work    Rehab Potential Good    PT Frequency 2x / week    PT Duration 8 weeks    PT Treatment/Interventions ADLs/Self Care Home Management;Gait training;Stair training;Functional mobility training;Therapeutic activities;Therapeutic exercise;Balance training;Neuromuscular re-education;Patient/family education;Passive range of motion;Energy conservation;Electrical Stimulation;DME Instruction;Orthotic Fit/Training    PT Next Visit Plan Land based PT: continue to work on functional E-stim as appropriate. Work on stepping strategies when outside LOS with balance execises, work on tandem and SLS balances. Continue to progress strengthening as tolerated. Pt has eye surgery scheduled in April where he anticipates will miss another week of PT. LTG scheduled to expire 04/30/20 to accomodate 2 wks of loss of therapy due to travel and 1 wk of  anticipated loss in April.    PT Home Exercise Plan Access Code: [email or web address]; [email or web address]. plus heel raises, and HS curls verbally added. Pt stated able to recall these without new handouts.    Consulted and Agree with Plan of Care Patient           Patient will benefit from skilled therapeutic intervention in order to improve the following deficits and impairments:  Abnormal gait,Decreased activity tolerance,Decreased balance,Decreased mobility,Decreased endurance,Decreased coordination,Decreased strength,Difficulty walking,Impaired sensation,Impaired tone,Postural dysfunction,Pain  Visit Diagnosis: Muscle weakness (generalized)  Other abnormalities of gait and mobility  Unsteadiness on feet  Difficulty in walking, not elsewhere classified     Problem List Patient Active Problem List   Diagnosis Date Noted  . Incomplete paraplegia ([place]) 11/08/2019  . Foot drop, bilateral 11/08/2019  . Spasticity 08/18/2019  . Chronic pain syndrome 08/18/2019  . Nerve pain 08/18/2019  . Clonus 07/21/2019  . Spinal cord tumor   . Pain   . Transaminitis   . Leukocytosis   . Neurogenic bowel   . Postoperative pain   . 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 Acute incomplete quadriplegia ([facility])   . 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 Numbness 05/10/2019  . Weakness of left lower extremity 05/10/2019  . 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 Visual disturbance 05/10/2019    Rico Junker, PT, DPT 03/29/20    3:36 PM    [facility] [address] [place], [place], [address] Phone: [phone]   Fax:  [phone]  Name: Jacob Lambert MRN: [id number] Date of Birth: 04-19-1985

## 2020-03-31 ENCOUNTER — Other Ambulatory Visit: Payer: Self-pay

## 2020-03-31 ENCOUNTER — Ambulatory Visit (HOSPITAL_COMMUNITY)
Admission: RE | Admit: 2020-03-31 | Discharge: 2020-03-31 | Disposition: A | Discharge status: Home / Self Care | Payer: BC Managed Care – PPO | Source: Ambulatory Visit | Attending: Internal Medicine | Admitting: Internal Medicine

## 2020-03-31 DIAGNOSIS — C720 Malignant neoplasm of spinal cord: Secondary | ICD-10-CM | POA: Insufficient documentation

## 2020-03-31 DIAGNOSIS — Z9889 Other specified postprocedural states: Secondary | ICD-10-CM | POA: Diagnosis not present

## 2020-03-31 DIAGNOSIS — D492 Neoplasm of unspecified behavior of bone, soft tissue, and skin: Secondary | ICD-10-CM | POA: Diagnosis not present

## 2020-03-31 DIAGNOSIS — C719 Malignant neoplasm of brain, unspecified: Secondary | ICD-10-CM | POA: Diagnosis not present

## 2020-03-31 MED ORDER — GADOBUTROL 1 MMOL/ML IV SOLN
7.0000 mL | Freq: Once | INTRAVENOUS | Status: AC | PRN
  Administered 2020-03-31: 7 mL via INTRAVENOUS

## 2020-04-01 ENCOUNTER — Inpatient Hospital Stay: Payer: BC Managed Care – PPO | Attending: Internal Medicine | Admitting: Internal Medicine

## 2020-04-01 ENCOUNTER — Inpatient Hospital Stay: Payer: BC Managed Care – PPO

## 2020-04-01 VITALS — BP 125/80 | HR 80 | Temp 96.9°F | Resp 17 | Ht 70.0 in | Wt 158.3 lb

## 2020-04-01 DIAGNOSIS — Z79899 Other long term (current) drug therapy: Secondary | ICD-10-CM | POA: Diagnosis not present

## 2020-04-01 DIAGNOSIS — Z993 Dependence on wheelchair: Secondary | ICD-10-CM | POA: Insufficient documentation

## 2020-04-01 DIAGNOSIS — R252 Cramp and spasm: Secondary | ICD-10-CM

## 2020-04-01 DIAGNOSIS — C720 Malignant neoplasm of spinal cord: Secondary | ICD-10-CM | POA: Insufficient documentation

## 2020-04-01 DIAGNOSIS — Z87891 Personal history of nicotine dependence: Secondary | ICD-10-CM | POA: Insufficient documentation

## 2020-04-01 DIAGNOSIS — R29898 Other symptoms and signs involving the musculoskeletal system: Secondary | ICD-10-CM

## 2020-04-01 DIAGNOSIS — Z7952 Long term (current) use of systemic steroids: Secondary | ICD-10-CM | POA: Diagnosis not present

## 2020-04-01 NOTE — Progress Notes (Signed)
[facility] [place], [place] [address] [phone]   Interval Evaluation  Date of Service: 04/01/20 Patient Name: Jacob Lambert Patient MRN: [id number] Patient DOB: 01-22-1985 Provider: Ventura Sellers, MD  Identifying Statement:  Jacob Lambert is a 35 y.o. adult with cervical ependymoma    Oncologic History: 06/01/19: Laminectomy, resection of cervical mass by Dr. Zada Finders; path demonstrates ependymoma grade II 09/19/19: Completes radiotherapy, 54 Gy in 30 fractions with Dr. Tammi Klippel  Interval History:  Jacob Lambert presents today for follow up after recent MRI cervical spine.  He doesn't describe significant changes regarding his gait, motor function, sensory deficits despite continued aggressive work with physical therapy.  Continues to utilize lower leg brace and cane for ambulation.  Has not lost weight but he feels he has lost muscle mass over recent months.  Still planning to move out [place] once he is through corneal surgeries, first scheduled for next month.   H+P (05/25/19) Patient presented to medical attention initially in February 2021 after noticing subtle gait impairment, most affecting him walking up stairs.  Symptoms evolved to include stiffness and weakness of both legs, involuntary tremulous movements of both legs, sensory changes, and sexual dysfunction.  There is also some numbness and weakness impacting his hands, described more as "clumsiness" expressed as difficulty with typing.  He is walking primarily with a cane at this time.  He underwent MRI of cervical spine earlier this week which demonstrated likely mass centered within the cord.  He is currently living with family locally because of disability, although has longer term residences both in [place].  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     Medications: Current Outpatient  Medications on File Prior to Visit  Medication Sig Dispense Refill  . acetaminophen (TYLENOL) 325 MG tablet Take 2 tablets (650 mg total) by mouth every 4 (four) hours as needed for mild pain ((score 1 to 3) or temp > 100.5).    . baclofen (LIORESAL) 10 MG tablet Take 1 tablet (10 mg total) by mouth 4 (four) times daily. 360 each 1  . bisacodyl (DULCOLAX) 10 MG suppository Place 1 suppository (10 mg total) rectally at bedtime. 12 suppository 0  . citalopram (CELEXA) 40 MG tablet Take 1 tablet (40 mg total) by mouth daily. 90 tablet 3  . dantrolene (DANTRIUM) 100 MG capsule Take 1 capsule (100 mg total) by mouth 3 (three) times daily. 270 capsule 1  . dexamethasone (DECADRON) 4 MG tablet     . diazepam (VALIUM) 10 MG tablet Take 1 tablet (10 mg total) by mouth every 6 (six) hours as needed. 30 tablet 0  . docusate sodium (COLACE) 100 MG capsule Take 1 capsule (100 mg total) by mouth 3 (three) times daily. 10 capsule 0  . gabapentin (NEURONTIN) 600 MG tablet Take 1 tablet (600 mg total) by mouth 3 (three) times daily. 270 tablet 1  . lidocaine (LIDODERM) 5 % Place 1 patch onto the skin daily. Remove & Discard patch within 12 hours or as directed by MD (Patient taking differently: Place 1 patch onto the skin daily. Remove & Discard patch within 12 hours or as directed by MD states he is using 4% patch) 30 patch 0  . methocarbamol (ROBAXIN) 500 MG tablet     . Multiple Vitamin (MULTIVITAMIN WITH MINERALS) TABS tablet Take 1 tablet by mouth daily.     Marland Kitchen  polyethylene glycol (MIRALAX / GLYCOLAX) 17 g packet Take 17 g by mouth 3 (three) times daily. (Patient taking differently: Take 17 g by mouth 2 (two) times daily.) 14 each 0  . senna-docusate (SENOKOT-S) 8.6-50 MG tablet Take 2 tablets by mouth at bedtime.    . sildenafil (VIAGRA) 50 MG tablet Take 1 tablet (50 mg total) by mouth as needed for erectile dysfunction. 10 tablet 3  . tiZANidine (ZANAFLEX) 2 MG tablet Take 1 tablet (2 mg total) by mouth 2  (two) times daily as needed for muscle spasms. 60 tablet 1   No current facility-administered medications on file prior to visit.    Allergies:  Allergies  Allergen Reactions  . Cephalosporins Hives  . Other     Patagonion toothfish   Past Medical History:  Past Medical History:  Diagnosis Date  . Anxiety   . Clonus   . Depression   . Headache   . Pneumonia    "walking"  . Quadriplegia and quadriparesis ([place])   . Spasticity   . Spinal cord tumor   . Wheelchair bound    Past Surgical History:  Past Surgical History:  Procedure Laterality Date  . LAMINECTOMY N/A 06/01/2019   Procedure: Cervical six-seven Laminoplasty, biopsy and resection of intramedullary spinal cord tumor;  Surgeon: [name], MD;  Location: [facility];  Service: Neurosurgery;  Laterality: N/A;  . NO PAST SURGERIES     Social History:  Social History   Socioeconomic History  . Marital status: Single    Spouse name: Not on file  . Number of children: Not on file  . Years of education: Not on file  . Highest education level: Not on file  Occupational History  . Not on file  Tobacco Use  . Smoking status: Former Research scientist (life sciences)  . Smokeless tobacco: Never Used  . Tobacco comment: none in years   Vaping Use  . Vaping Use: Never used  Substance and Sexual Activity  . Alcohol use: Yes    Comment: 2 bottles of wine per week, Maybe 4 - 8 shots aweek  . Drug use: Not Currently    Comment: CDB  . Sexual activity: Not Currently  Other Topics Concern  . Not on file  Social History Narrative   Lives with parents   Caffeine use: minimum- 3 cups per day (on average 6-10 cups per day)   Left handed    Social Determinants of Health   Financial Resource Strain: Not on file  Food Insecurity: Not on file  Transportation Needs: Not on file  Physical Activity: Not on file  Stress: Not on file  Social Connections: Not on file  Intimate Partner Violence: Not on file   Family History: No family history on  file.  Review of Systems: Constitutional: Doesn't report fevers, chills or abnormal weight loss Eyes: Doesn't report blurriness of vision Ears, nose, mouth, throat, and face: Doesn't report sore throat Respiratory: Doesn't report cough, dyspnea or wheezes Cardiovascular: Doesn't report palpitation, chest discomfort  Gastrointestinal:  Doesn't report nausea, constipation, diarrhea GU: Doesn't report incontinence Skin: Doesn't report skin rashes Neurological: Per HPI Musculoskeletal: Doesn't report joint pain Behavioral/Psych: Doesn't report anxiety  Physical Exam: Vitals:   04/01/20 1103  BP: 125/80  Pulse: 80  Resp: 17  Temp: (!) 96.9 F (36.1 C)  SpO2: 100%   KPS: 60. General: Alert, cooperative, pleasant, in no acute distress Head: Normal EENT: No conjunctival injection or scleral icterus.  Lungs: Resp effort normal Cardiac: Regular rate  Abdomen: Non-distended abdomen Skin: No rashes cyanosis or petechiae. Extremities: No clubbing or edema  Neurologic Exam: Mental Status: Awake, alert, attentive to examiner. Oriented to self and environment. Language is fluent with intact comprehension.  Cranial Nerves: Visual acuity is impaired in left eye. Visual fields are full. Extra-ocular movements intact. No ptosis. Face is symmetric Motor: Tone and bulk are normal. Power is 4/5 in both legs, 4+/5 in arms, left hand has impaired coordination and fine motor function. Reflexes are broadly increased with spontaneous clonus Sensory: Stocking impairment, left arm impaired in C8/T1 distribution Gait: Spastic, assisted with cane  Labs: I have reviewed the data as listed    Component Value Date/Time   NA 139 12/06/2019 1323   K 4.8 12/06/2019 1323   CL 99 12/06/2019 1323   CO2 27 12/06/2019 1323   GLUCOSE 130 (H) 12/06/2019 1323   GLUCOSE 93 06/27/2019 0726   BUN 13 12/06/2019 1323   CREATININE 0.97 12/06/2019 1323   CALCIUM 9.8 12/06/2019 1323   PROT 7.6 12/06/2019 1323    ALBUMIN 4.8 12/06/2019 1323   AST 18 12/06/2019 1323   ALT 22 12/06/2019 1323   ALKPHOS 45 12/06/2019 1323   BILITOT 0.4 12/06/2019 1323   GFRNONAA 101 12/06/2019 1323   GFRAA 117 12/06/2019 1323   Lab Results  Component Value Date   WBC 8.4 06/27/2019   NEUTROABS 4.7 06/27/2019   HGB 13.2 06/27/2019   HCT 41.8 06/27/2019   MCV 87.1 06/27/2019   PLT 284 06/27/2019    Imaging:  [facility] Clinician Interpretation: I have personally reviewed the CNS images as listed.  My interpretation, in the context of the patient's clinical presentation, is stable disease  MR CERVICAL SPINE W WO CONTRAST  Result Date: 04/01/2020 CLINICAL DATA:  Spinal cord ependymoma.  Assess treatment response. EXAM: MRI CERVICAL SPINE WITHOUT AND WITH CONTRAST TECHNIQUE: Multiplanar and multiecho pulse sequences of the cervical spine, to include the craniocervical junction and cervicothoracic junction, were obtained without and with intravenous contrast. CONTRAST:  48mL GADAVIST GADOBUTROL 1 MMOL/ML IV SOLN COMPARISON:  MRI of the cervical spine December 21, 2019. FINDINGS: Alignment: Reversal of the cervical curvature, new from prior MRI. Vertebrae: Postsurgical changes from C6 and C7 laminoplasty. No significant interval change of the postsurgical fluid collection along the C6 through T1 posterior elements, involving the C7 spinous process which shows marrow edema. No evidence of fracture, discitis or primary bone lesion. Cord: Postsurgical changes from prior spinal cord tumor resection with surgical cavity extending from C5-C6 through the T1-2 level with hemosiderin deposits at the inferior and superior edges of the cavity, cystic changes and volume loss. There is an elongated focus with questionable contrast enhancement within the surgical cavity at the T1 level (series 8, image 7; series 9, image 37) measuring approximately 6 mm in length and 1 mm in thickness. Findings are stable since prior MRI. Posterior Fossa, vertebral  arteries, paraspinal tissues: Postsurgical changes, as described above. Disc levels: No significant disc herniation, spinal canal or neural foraminal stenosis at any level. IMPRESSION: 1. Stable postsurgical changes from prior spinal cord tumor resection with surgical cavity extending from C5-C6 through the T1 level. Questionable of focus of contrast enhancement within the inferior aspect of the surgical cavity at the T1 level, unchanged. 2. No significant disc herniation, spinal canal or neural foraminal stenosis at any level. Electronically Signed   By: Pedro Earls M.D.   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  He will continue on dantrolene, baclofen, gabapentin, and PRN tizanadine for spasticity, weakness, neuropathic symptoms.    Case and imaging were reviewed in brain/spine tumor board meeting on 04/01/20.   We ask that Danni Leabo return to clinic in 6 months following next brain MRI, or sooner as needed.  All questions were answered. The patient knows to call the clinic with any problems, questions or concerns. No barriers to learning were detected.  The total time spent in the encounter was 40 minutes and more than 50% was on counseling and review of test results   Ventura Sellers, MD Medical Director of Neuro-Oncology [facility] at [place] 04/01/20 11:05 AM

## 2020-04-03 ENCOUNTER — Encounter: Payer: Self-pay | Admitting: Physical Therapy

## 2020-04-03 ENCOUNTER — Other Ambulatory Visit: Payer: Self-pay

## 2020-04-03 ENCOUNTER — Ambulatory Visit: Payer: BC Managed Care – PPO | Admitting: Physical Therapy

## 2020-04-03 DIAGNOSIS — R278 Other lack of coordination: Secondary | ICD-10-CM | POA: Diagnosis not present

## 2020-04-03 DIAGNOSIS — R2681 Unsteadiness on feet: Secondary | ICD-10-CM

## 2020-04-03 DIAGNOSIS — M6281 Muscle weakness (generalized): Secondary | ICD-10-CM

## 2020-04-03 DIAGNOSIS — R2689 Other abnormalities of gait and mobility: Secondary | ICD-10-CM | POA: Diagnosis not present

## 2020-04-03 DIAGNOSIS — R262 Difficulty in walking, not elsewhere classified: Secondary | ICD-10-CM

## 2020-04-03 DIAGNOSIS — R208 Other disturbances of skin sensation: Secondary | ICD-10-CM | POA: Diagnosis not present

## 2020-04-03 DIAGNOSIS — R293 Abnormal posture: Secondary | ICD-10-CM

## 2020-04-04 NOTE — Therapy (Signed)
[facility] [address] [place], [place], [address] Phone: [phone]   Fax:  [phone]  Physical Therapy Treatment  Patient Details  Name: Jacob Lambert MRN: [id number] Date of Birth: 1985/10/05 Referring Provider (PT): Dr. Courtney Heys   Encounter Date: 04/03/2020   PT End of Session - 04/03/20 1412    Visit Number 48    Number of Visits 55    Date for PT Re-Evaluation 04/15/20    Authorization Type BCBS - VL: 30 PT/OT combined    Authorization - Visit Number 20    Authorization - Number of Visits 30    PT Start Time 6720    PT Stop Time 1445    PT Time Calculation (min) 40 min    Equipment Utilized During Treatment Other (comment);Gait belt   bil AFO's   Activity Tolerance Patient tolerated treatment well    Behavior During Therapy WFL for tasks assessed/performed           Past Medical History:  Diagnosis Date  . Anxiety   . Clonus   . Depression   . Headache   . Pneumonia    "walking"  . Quadriplegia and quadriparesis ([place])   . Spasticity   . Spinal cord tumor   . Wheelchair bound     Past Surgical History:  Procedure Laterality Date  . LAMINECTOMY N/A 06/01/2019   Procedure: Cervical six-seven Laminoplasty, biopsy and resection of intramedullary spinal cord tumor;  Surgeon: [name], MD;  Location: [facility];  Service: Neurosurgery;  Laterality: N/A;  . NO PAST SURGERIES      There were no vitals filed for this visit.   Subjective Assessment - 04/03/20 1407    Subjective Goes tomorrow for initial consult for lense fitting- prescription, how to apply/remove from eye, etc, then will get the right eye lense he will use in about 2-3 weeks. Left eye lense will be case by case basis. Does not need to use it, however it did help with vision. Will really depend on what happens after surgery on the left eye (right eye will be done first). No falls.    Limitations Standing;Walking;House hold  activities    How long can you sit comfortably? no issues    How long can you stand comfortably? <5 min    How long can you walk comfortably? <0.25 mile    Patient Stated Goals Progress walking    Currently in Pain? Yes    Pain Location Knee    Pain Orientation Right;Left    Pain Descriptors / Indicators Burning    Pain Type Neuropathic pain    Pain Onset More than a month ago    Pain Frequency Intermittent    Aggravating Factors  increased activity    Pain Relieving Factors pain meds                [facility] Adult PT Treatment/Exercise - 04/03/20 1414      Transfers   Transfers Sit to Stand;Stand to Sit    Sit to Stand 6: Modified independent (Device/Increase time)    Stand to Sit 6: Modified independent (Device/Increase time)      Ambulation/Gait   Ambulation/Gait No    Ambulation/Gait Assistance 5: Supervision;4: Min guard    Ambulation/Gait Assistance Details veering noted with no significant balance loss noted, min guard to min assist on outdoor paved surfaces;    Ambulation Distance (Feet) 500 Feet   x1 in/outdoors, plus around gym with session  Assistive device Straight cane    Gait Pattern Step-through pattern;Lateral hip instability;Narrow base of support    Ambulation Surface Level;Indoor;Unlevel;Outdoor;Paved               Balance Exercises - 04/03/20 1442      Balance Exercises: Standing   Standing Eyes Closed Narrow base of support (BOS);Wide (BOA);Head turns;Foam/compliant surface;Other reps (comment);30 secs;Limitations    Standing Eyes Closed Limitations on airex no UE support: feet together - EC 30 sec's x 3 reps, progressing to feet apart- EC head movements left<>right, up<>down for ~10 reps each.    Balance Beam standing across blue foam beam- alternating stepping forward to fthe floor/back onto the beam, then alternating stepping backward to the floor/back onto the beam for ~10 reps each.    Tandem Gait Forward;Retro;Foam/compliant  surface;Intermittent upper extremity support;3 reps;Limitations    Tandem Gait Limitations on blue foam beam for 3 laps with cues on posture, step placement and to use bars as needed for balance. min assist needed for balance.    Sidestepping Foam/compliant support;3 reps    Sidestepping Limitations on blue foam beam for 3 laps toward each direction with cues on posture and step height/length. min guard to min assist with occasional touch to bars for balance.               PT Short Term Goals - 02/15/20 1556      PT SHORT TERM GOAL #1   Title = LTG             PT Long Term Goals - 03/27/20 1329      PT LONG TERM GOAL #1   Title Patient will demo >54/56 on berg balance scale to improve functional balance and reduce fall risk    Baseline 02/13/20: 51/56; 03/27/20: 52/56    Time 4    Period Weeks    Status Revised    Target Date 04/30/20      PT LONG TERM GOAL #2   Title Patient will be able to ambulate >1000 feet outside with LRAD and bilat GRAFO to improve short distance community ambulation.    Baseline 02/13/20: 730 feet with cane on indoor/outdoor paved surfaces with min guard assist for safety, improved just not to goal level; 03/27/20: with bil GRAFO and st. cane in L hand, CGA for safety, 985' pt rpeorted R knee was burning.    Time 4    Period Weeks    Status Achieved      PT LONG TERM GOAL #3   Title Pt will improve gait velocity with LRAD and bilat GRAFO's to >/= 3.6 ft/sec    Baseline 3.36 with cane; 03/27/20 3.78 ft/sec    Time 4    Period Weeks    Status Achieved      PT LONG TERM GOAL #4   Title Pt will demonstrate ability to negotiate curb, ramp and 12 stairs with cane, bilat GRAFO's safely Mod I    Time 4    Period Weeks    Status Revised    Target Date 04/30/20                 Plan - 04/03/20 1412    Clinical Impression Statement Today's skilled session focused on gait on various surfaces and balance with decreased UE support with bil AFO's  donned. No issues reported or noted in session. The pt is progressing toward goals and should benefit from continued PT to progress toward unmet goals.  Personal Factors and Comorbidities Age;Past/Current Experience;Comorbidity 2;Time since onset of injury/illness/exacerbation;Transportation    Examination-Activity Limitations Bathing;Lift;Squat;Stairs;Stand;Transfers    Examination-Participation Restrictions Cleaning;Community Activity;Driving;Laundry;Occupation;Shop;Yard Work    Rehab Potential Good    PT Frequency 2x / week    PT Duration 8 weeks    PT Treatment/Interventions ADLs/Self Care Home Management;Gait training;Stair training;Functional mobility training;Therapeutic activities;Therapeutic exercise;Balance training;Neuromuscular re-education;Patient/family education;Passive range of motion;Energy conservation;Electrical Stimulation;DME Instruction;Orthotic Fit/Training    PT Next Visit Plan Land based PT: continue to work on functional E-stim as appropriate. Work on stepping strategies when outside LOS with balance execises, work on tandem and SLS balances. Continue to progress strengthening as tolerated. Pt has eye surgery scheduled in April where he anticipates will miss another week of PT. LTG scheduled to expire 04/30/20 to accomodate 2 wks of loss of therapy due to travel and 1 wk of anticipated loss in April.    PT Home Exercise Plan Access Code: [email or web address]; [email or web address]. plus heel raises, and HS curls verbally added. Pt stated able to recall these without new handouts.    Consulted and Agree with Plan of Care Patient           Patient will benefit from skilled therapeutic intervention in order to improve the following deficits and impairments:  Abnormal gait,Decreased activity tolerance,Decreased balance,Decreased mobility,Decreased endurance,Decreased coordination,Decreased strength,Difficulty walking,Impaired sensation,Impaired tone,Postural dysfunction,Pain  Visit Diagnosis: Muscle  weakness (generalized)  Other abnormalities of gait and mobility  Unsteadiness on feet  Difficulty in walking, not elsewhere classified  Abnormal posture     Problem List Patient Active Problem List   Diagnosis Date Noted  . Incomplete paraplegia ([place]) 11/08/2019  . Foot drop, bilateral 11/08/2019  . Spasticity 08/18/2019  . Chronic pain syndrome 08/18/2019  . Nerve pain 08/18/2019  . Clonus 07/21/2019  . Spinal cord tumor   . Pain   . Transaminitis   . Leukocytosis   . Neurogenic bowel   . Postoperative pain   . Ependymoma ([place]) 06/08/2019  . Acute incomplete quadriplegia ([facility])   . Spinal cord ependymoma ([place]) 05/23/2019  . Numbness 05/10/2019  . Weakness of left lower extremity 05/10/2019  . 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 Visual disturbance 05/10/2019    Willow Ora, PTA, [facility] Outpatient Neuro [facility] [address], [place], [place] [address] [phone] 04/04/20, 3:08 PM   Name: Jacob Lambert MRN: [id number] Date of Birth: 10-30-1985

## 2020-04-05 ENCOUNTER — Ambulatory Visit: Payer: BC Managed Care – PPO | Attending: Physical Medicine and Rehabilitation | Admitting: Physical Therapy

## 2020-04-05 ENCOUNTER — Encounter: Payer: Self-pay | Admitting: Physical Therapy

## 2020-04-05 ENCOUNTER — Other Ambulatory Visit: Payer: Self-pay

## 2020-04-05 DIAGNOSIS — R208 Other disturbances of skin sensation: Secondary | ICD-10-CM | POA: Insufficient documentation

## 2020-04-05 DIAGNOSIS — R2689 Other abnormalities of gait and mobility: Secondary | ICD-10-CM | POA: Diagnosis not present

## 2020-04-05 DIAGNOSIS — R2681 Unsteadiness on feet: Secondary | ICD-10-CM

## 2020-04-05 DIAGNOSIS — R293 Abnormal posture: Secondary | ICD-10-CM | POA: Diagnosis not present

## 2020-04-05 DIAGNOSIS — R262 Difficulty in walking, not elsewhere classified: Secondary | ICD-10-CM | POA: Diagnosis not present

## 2020-04-05 DIAGNOSIS — M6281 Muscle weakness (generalized): Secondary | ICD-10-CM | POA: Diagnosis not present

## 2020-04-05 DIAGNOSIS — R278 Other lack of coordination: Secondary | ICD-10-CM | POA: Diagnosis not present

## 2020-04-05 NOTE — Therapy (Addendum)
[facility] [address] [place], [place], [address] Phone: [phone]   Fax:  [phone]  Physical Therapy Treatment  Patient Details  Name: Jacob Lambert MRN: [id number] Date of Birth: Sep 01, 1985 Referring Provider (PT): Dr. Courtney Heys   Encounter Date: 04/05/2020   PT End of Session - 04/05/20 1452    Visit Number 1    Number of Visits 55    Date for PT Re-Evaluation 04/15/20    Authorization Type BCBS - VL: 30 PT/OT combined    Authorization - Visit Number 21    Authorization - Number of Visits 30    PT Start Time 8338    PT Stop Time 1530    PT Time Calculation (min) 42 min    Equipment Utilized During Treatment Other (comment);Gait belt   bil Bioness   Activity Tolerance Patient tolerated treatment well    Behavior During Therapy WFL for tasks assessed/performed           Past Medical History:  Diagnosis Date  . Anxiety   . Clonus   . Depression   . Headache   . Pneumonia    "walking"  . Quadriplegia and quadriparesis ([place])   . Spasticity   . Spinal cord tumor   . Wheelchair bound     Past Surgical History:  Procedure Laterality Date  . 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 NO PAST SURGERIES      There were no vitals filed for this visit.   Subjective Assessment - 04/05/20 1450    Subjective No new complains. No falls.    Limitations Standing;Walking;House hold activities    How long can you sit comfortably? no issues    How long can you stand comfortably? <5 min    How long can you walk comfortably? <0.25 mile    Patient Stated Goals Progress walking    Currently in Pain? Yes    Pain Score 6     Pain Location Leg    Pain Orientation Right;Left    Pain Descriptors / Indicators Burning     Pain Type Chronic pain;Neuropathic pain    Pain Onset More than a month ago    Pain Frequency Intermittent    Aggravating Factors  increased activity    Pain Relieving Factors pain meds               04/05/20 1706  Transfers  Transfers Sit to Stand;Stand to Sit  Sit to Stand 6: Modified independent (Device/Increase time)  Stand to Sit 6: Modified independent (Device/Increase time)  Ambulation/Gait  Ambulation/Gait Yes  Ambulation/Gait Assistance 5: Supervision;4: Min guard  Ambulation/Gait Assistance Details aroudn gym with session concurrent with Bioness to bil LE"s.  Assistive device Straight cane  Gait Pattern Step-through pattern;Lateral hip instability;Narrow base of support  Ambulation Surface Level;Indoor  Exercises  Exercises Other Exercises  Other Exercises  concurrent with bioness to bil LE"s: in parallel bars with light UE support- side stepping in squat position for 3 laps each way, then fwd/bwd diagonal stepping in squat postion for 3 laps each way. Min guard assist for safety, performed in walking mode; then with bil UE support- alternating forward lunging/and back with on time, rest with off time for 6 reps each side; at mat table- hovering over mat with on time of 5  sec's, rest with off time of 8 sec's x 10 reps, no UE support.  Knee/Hip Exercises: Aerobic  Elliptical concurrent with Bioness x 3 mintues at level 2.0 with UE support  Dance movement psychotherapist Stimulation Location bilat anterior tib and quads  Electrical Stimulation Action for increased muscle activation/strengthening  Electrical Stimulation Parameters refer to Tablet 1 for adjusted parameters; quick fit electrodes  Electrical Stimulation Goals Strength;Neuromuscular facilitation;Pain         PT Short Term Goals - 02/15/20 1556      PT SHORT TERM GOAL #1   Title = LTG             PT Long Term Goals - 03/27/20 1329      PT LONG TERM GOAL #1   Title Patient will demo >54/56 on  berg balance scale to improve functional balance and reduce fall risk    Baseline 02/13/20: 51/56; 03/27/20: 52/56    Time 4    Period Weeks    Status Revised    Target Date 04/30/20      PT LONG TERM GOAL #2   Title Patient will be able to ambulate >1000 feet outside with LRAD and bilat GRAFO to improve short distance community ambulation.    Baseline 02/13/20: 730 feet with cane on indoor/outdoor paved surfaces with min guard assist for safety, improved just not to goal level; 03/27/20: with bil GRAFO and st. cane in L hand, CGA for safety, 985' pt rpeorted R knee was burning.    Time 4    Period Weeks    Status Achieved      PT LONG TERM GOAL #3   Title Pt will improve gait velocity with LRAD and bilat GRAFO's to >/= 3.6 ft/sec    Baseline 3.36 with cane; 03/27/20 3.78 ft/sec    Time 4    Period Weeks    Status Achieved      PT LONG TERM GOAL #4   Title Pt will demonstrate ability to negotiate curb, ramp and 12 stairs with cane, bilat GRAFO's safely Mod I    Time 4    Period Weeks    Status Revised    Target Date 04/30/20              04/05/20 1452  Plan  Clinical Impression Statement Today's skilled session continued with use of Bioness to bil LE's with gait and ex's/balance activities. Pt's LE"s very fatigued after 3 minutes on elliptical this session, able to continue after a brief rest break. No other issues noted or reported in session. The pt is progressing toward goals and should benefit from continued PT to progress toward unmet goals.    Plan - 04/05/20 1452    Personal Factors and Comorbidities Age;Past/Current Experience;Comorbidity 2;Time since onset of injury/illness/exacerbation;Transportation    Examination-Activity Limitations Bathing;Lift;Squat;Stairs;Stand;Transfers    Examination-Participation Restrictions Cleaning;Community Activity;Driving;Laundry;Occupation;Shop;Yard Work    Rehab Potential Good    PT Frequency 2x / week    PT Duration 8 weeks    PT  Treatment/Interventions ADLs/Self Care Home Management;Gait training;Stair training;Functional mobility training;Therapeutic activities;Therapeutic exercise;Balance training;Neuromuscular re-education;Patient/family education;Passive range of motion;Energy conservation;Electrical Stimulation;DME Instruction;Orthotic Fit/Training    PT Next Visit Plan Land based PT: continue to work on functional E-stim as appropriate. Work on stepping strategies when outside LOS with balance execises, work on tandem and SLS balances. Continue to progress strengthening as tolerated. Pt has eye surgery scheduled in April where he anticipates will miss another week of PT. LTG scheduled to expire  04/30/20 to accomodate 2 wks of loss of therapy due to travel and 1 wk of anticipated loss in April.    PT Home Exercise Plan Access Code: [email or web address]; [email or web address]. plus heel raises, and HS curls verbally added. Pt stated able to recall these without new handouts.    Consulted and Agree with Plan of Care Patient           Patient will benefit from skilled therapeutic intervention in order to improve the following deficits and impairments:  Abnormal gait,Decreased activity tolerance,Decreased balance,Decreased mobility,Decreased endurance,Decreased coordination,Decreased strength,Difficulty walking,Impaired sensation,Impaired tone,Postural dysfunction,Pain  Visit Diagnosis: Muscle weakness (generalized)  Other abnormalities of gait and mobility  Unsteadiness on feet  Difficulty in walking, not elsewhere classified     Problem List Patient Active Problem List   Diagnosis Date Noted  . Incomplete paraplegia ([place]) 11/08/2019  . Foot drop, bilateral 11/08/2019  . Spasticity 08/18/2019  . Chronic pain syndrome 08/18/2019  . Nerve pain 08/18/2019  . Clonus 07/21/2019  . Spinal cord tumor   . Pain   . Transaminitis   . Leukocytosis   . Neurogenic bowel   . Postoperative pain   . 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 Acute  incomplete quadriplegia ([facility])   . 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 Numbness 05/10/2019  . Weakness of left lower extremity 05/10/2019  . 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 Visual disturbance 05/10/2019    Willow Ora, PTA, [facility] Outpatient Neuro [facility] [address], [place], [place] [address] [phone] 04/10/20, 10:31 AM   Name: Jacob Lambert MRN: [id number] Date of Birth: 09/21/1985

## 2020-04-09 ENCOUNTER — Ambulatory Visit: Payer: BC Managed Care – PPO | Admitting: Physical Therapy

## 2020-04-09 ENCOUNTER — Encounter: Payer: Self-pay | Admitting: Physical Therapy

## 2020-04-09 ENCOUNTER — Other Ambulatory Visit: Payer: Self-pay

## 2020-04-09 DIAGNOSIS — M6281 Muscle weakness (generalized): Secondary | ICD-10-CM

## 2020-04-09 DIAGNOSIS — R262 Difficulty in walking, not elsewhere classified: Secondary | ICD-10-CM

## 2020-04-09 DIAGNOSIS — R208 Other disturbances of skin sensation: Secondary | ICD-10-CM | POA: Diagnosis not present

## 2020-04-09 DIAGNOSIS — R293 Abnormal posture: Secondary | ICD-10-CM | POA: Diagnosis not present

## 2020-04-09 DIAGNOSIS — R2681 Unsteadiness on feet: Secondary | ICD-10-CM

## 2020-04-09 DIAGNOSIS — R2689 Other abnormalities of gait and mobility: Secondary | ICD-10-CM | POA: Diagnosis not present

## 2020-04-09 DIAGNOSIS — R278 Other lack of coordination: Secondary | ICD-10-CM | POA: Diagnosis not present

## 2020-04-10 NOTE — Therapy (Signed)
[facility] [address] [place], [place], [address] Phone: [phone]   Fax:  [phone]  Physical Therapy Treatment  Patient Details  Name: Jacob Lambert MRN: [id number] Date of Birth: 1985-10-02 Referring Provider (PT): Dr. Courtney Heys   Encounter Date: 04/09/2020   PT End of Session - 04/10/20 1228    Visit Number 50    Number of Visits 55    Date for PT Re-Evaluation 04/15/20    Authorization Type BCBS - VL: 30 PT/OT combined    Authorization - Visit Number 22    Authorization - Number of Visits 30    PT Start Time 1406    PT Stop Time 1450    PT Time Calculation (min) 44 min    Equipment Utilized During Treatment Other (comment)   bil Bioness   Activity Tolerance Patient tolerated treatment well    Behavior During Therapy WFL for tasks assessed/performed           Past Medical History:  Diagnosis Date  . Anxiety   . Clonus   . Depression   . Headache   . Pneumonia    "walking"  . Quadriplegia and quadriparesis ([place])   . Spasticity   . Spinal cord tumor   . Wheelchair bound     Past Surgical History:  Procedure Laterality Date  . LAMINECTOMY N/A 06/01/2019   Procedure: Cervical six-seven Laminoplasty, biopsy and resection of intramedullary spinal cord tumor;  Surgeon: [name], MD;  Location: [facility];  Service: Neurosurgery;  Laterality: N/A;  . NO PAST SURGERIES      There were no vitals filed for this visit.   Subjective Assessment - 04/09/20 1409    Subjective Surgery for R eye is on Friday; L eye won't be until summer/fall.  No therapy next week.  Will set up more visits for the next week.    Limitations Standing;Walking;House hold activities    How long can you sit comfortably? no issues    How long can you stand comfortably? <5 min    How long can you walk comfortably? <0.25 mile    Patient Stated Goals Progress walking    Currently in Pain? No/denies    Pain Onset More  than a month ago                             [facility] Adult PT Treatment/Exercise - 04/10/20 1212      Ambulation/Gait   Ambulation/Gait Yes    Ambulation/Gait Assistance 5: Supervision    Ambulation/Gait Assistance Details with cane with Bioness in gait mode on RLE only today    Assistive device Straight cane    Gait Pattern Step-through pattern;Lateral hip instability;Narrow base of support    Ambulation Surface Level;Indoor      Knee/Hip Exercises: Aerobic   Elliptical concurrent with Bioness on RLE only today on cycle training mode x 3 mintues at level 1.5 resistance with UE support; significant LE fatigue by end and leaning heavily on UE      Modalities   Modalities Electrical Stimulation      Electrical Stimulation   Electrical Stimulation Location R LE only today; anterior tib and quad due to equipment malfunction on L EPG    Electrical Stimulation Action open and closed chain ankle DF and knee extension    Electrical Stimulation Parameters refer to Tablet 1 for adjusted parameters; quick fit electrodes    Electrical  Stimulation Goals Strength;Neuromuscular facilitation;Pain               Balance Exercises - 04/10/20 1219      Balance Exercises: Standing   Rockerboard Anterior/posterior;Lateral;10 reps;Intermittent UE support;Limitations    Rockerboard Limitations with Bioness in training mode, 5 seconds on, 5 seconds off.  When on cued pt to either shift posteriorly or laterally to R.  When off cued to shift anteriorly or to L.  With Bioness off performed active perturbations rockerboard randomly with ant/post and lateral and cued pt to utilize ankle and hip strategies to return to midline.  Intermittent UE support and intermittent assistance required from PT to prevent fall.               PT Short Term Goals - 02/15/20 1556      PT SHORT TERM GOAL #1   Title = LTG             PT Long Term Goals - 03/27/20 1329      PT LONG TERM GOAL #1    Title Patient will demo >54/56 on berg balance scale to improve functional balance and reduce fall risk    Baseline 02/13/20: 51/56; 03/27/20: 52/56    Time 4    Period Weeks    Status Revised    Target Date 04/30/20      PT LONG TERM GOAL #2   Title Patient will be able to ambulate >1000 feet outside with LRAD and bilat GRAFO to improve short distance community ambulation.    Baseline 02/13/20: 730 feet with cane on indoor/outdoor paved surfaces with min guard assist for safety, improved just not to goal level; 03/27/20: with bil GRAFO and st. cane in L hand, CGA for safety, 985' pt rpeorted R knee was burning.    Time 4    Period Weeks    Status Achieved      PT LONG TERM GOAL #3   Title Pt will improve gait velocity with LRAD and bilat GRAFO's to >/= 3.6 ft/sec    Baseline 3.36 with cane; 03/27/20 3.78 ft/sec    Time 4    Period Weeks    Status Achieved      PT LONG TERM GOAL #4   Title Pt will demonstrate ability to negotiate curb, ramp and 12 stairs with cane, bilat GRAFO's safely Mod I    Time 4    Period Weeks    Status Revised    Target Date 04/30/20                 Plan - 04/10/20 1228    Clinical Impression Statement Did not progress aerobic training on elliptical as pt continues to experience significant LE fatigue after 3 minutes.  Only able to utilize Bioness on RLE today due to malfunction of L EPG.  Continued to utilize Bioness during aerobic conditioning and dynamic balance/balance reaction training.  Added in perturbations with pt reporting awareness of LE ankle and quad muscles activating even when Bioness was not stimulating.  Will assess LTG next session and then will place pt on hold until after eye surgery; will then resume land and aquatic therapy.    Personal Factors and Comorbidities Age;Past/Current Experience;Comorbidity 2;Time since onset of injury/illness/exacerbation;Transportation    Examination-Activity Limitations  Bathing;Lift;Squat;Stairs;Stand;Transfers    Examination-Participation Restrictions Cleaning;Community Activity;Driving;Laundry;Occupation;Shop;Yard Work    Rehab Potential Good    PT Frequency 2x / week    PT Duration 8 weeks    PT Treatment/Interventions ADLs/Self  Care Home Management;Gait training;Stair training;Functional mobility training;Therapeutic activities;Therapeutic exercise;Balance training;Neuromuscular re-education;Patient/family education;Passive range of motion;Energy conservation;Electrical Stimulation;DME Instruction;Orthotic Fit/Training    PT Next Visit Plan Check LTG and send to me for recert; next visit isnt until MAY.  Land based PT: continue to work on functional E-stim as appropriate. Work on stepping strategies when outside LOS with balance execises/strategies, work on tandem and SLS balances. Elliptical with Bioness: Continue to progress as tolerated.    PT Home Exercise Plan Access Code: [email or web address]; [email or web address]. plus heel raises, and HS curls verbally added. Pt stated able to recall these without new handouts.    Consulted and Agree with Plan of Care Patient           Patient will benefit from skilled therapeutic intervention in order to improve the following deficits and impairments:  Abnormal gait,Decreased activity tolerance,Decreased balance,Decreased mobility,Decreased endurance,Decreased coordination,Decreased strength,Difficulty walking,Impaired sensation,Impaired tone,Postural dysfunction,Pain  Visit Diagnosis: Muscle weakness (generalized)  Other abnormalities of gait and mobility  Unsteadiness on feet  Difficulty in walking, not elsewhere classified  Other disturbances of skin sensation     Problem List Patient Active Problem List   Diagnosis Date Noted  . Incomplete paraplegia ([place]) 11/08/2019  . Foot drop, bilateral 11/08/2019  . Spasticity 08/18/2019  . Chronic pain syndrome 08/18/2019  . Nerve pain 08/18/2019  . Clonus 07/21/2019  . Spinal  cord tumor   . Pain   . Transaminitis   . Leukocytosis   . Neurogenic bowel   . Postoperative pain   . 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 Acute incomplete quadriplegia ([facility])   . 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 Numbness 05/10/2019  . Weakness of left lower extremity 05/10/2019  . 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 Visual disturbance 05/10/2019    Rico Junker, PT, DPT 04/10/20    12:47 PM    [facility] [address] [place], [place], [address] Phone: [phone]   Fax:  [phone]  Name: Jacob Lambert MRN: [id number] Date of Birth: Sep 11, 1985

## 2020-04-11 ENCOUNTER — Other Ambulatory Visit: Payer: Self-pay

## 2020-04-11 ENCOUNTER — Ambulatory Visit: Payer: BC Managed Care – PPO | Admitting: Physical Therapy

## 2020-04-11 ENCOUNTER — Encounter: Payer: Self-pay | Admitting: Physical Therapy

## 2020-04-11 DIAGNOSIS — R293 Abnormal posture: Secondary | ICD-10-CM

## 2020-04-11 DIAGNOSIS — R262 Difficulty in walking, not elsewhere classified: Secondary | ICD-10-CM | POA: Diagnosis not present

## 2020-04-11 DIAGNOSIS — M6281 Muscle weakness (generalized): Secondary | ICD-10-CM

## 2020-04-11 DIAGNOSIS — R2681 Unsteadiness on feet: Secondary | ICD-10-CM | POA: Diagnosis not present

## 2020-04-11 DIAGNOSIS — R278 Other lack of coordination: Secondary | ICD-10-CM

## 2020-04-11 DIAGNOSIS — R208 Other disturbances of skin sensation: Secondary | ICD-10-CM

## 2020-04-11 DIAGNOSIS — R2689 Other abnormalities of gait and mobility: Secondary | ICD-10-CM | POA: Diagnosis not present

## 2020-04-11 NOTE — Therapy (Addendum)
[facility] [address] [place], [place], [address] Phone: [phone]   Fax:  [phone]  Physical Therapy Treatment  Patient Details  Name: Jacob Lambert MRN: [id number] Date of Birth: 08/31/1985 Referring Provider (PT): Dr. Courtney Heys   Encounter Date: 04/11/2020   PT End of Session - 04/11/20 1410    Visit Number 73    Number of Visits 55    Date for PT Re-Evaluation 04/15/20    Authorization Type BCBS - VL: 30 PT/OT combined    Authorization - Visit Number 23    Authorization - Number of Visits 30    PT Start Time 3244    PT Stop Time 1445    PT Time Calculation (min) 40 min    Equipment Utilized During Treatment Other (comment)   bil AFO's   Activity Tolerance Patient tolerated treatment well    Behavior During Therapy [facility] for tasks assessed/performed           Past Medical History:  Diagnosis Date  . Anxiety   . Clonus   . Depression   . Headache   . Pneumonia    "walking"  . Quadriplegia and quadriparesis ([place])   . Spasticity   . Spinal cord tumor   . Wheelchair bound     Past Surgical History:  Procedure Laterality Date  . LAMINECTOMY N/A 06/01/2019   Procedure: Cervical six-seven Laminoplasty, biopsy and resection of intramedullary spinal cord tumor;  Surgeon: [name], MD;  Location: [facility];  Service: Neurosurgery;  Laterality: N/A;  . NO PAST SURGERIES      There were no vitals filed for this visit.   Subjective Assessment - 04/11/20 1408    Subjective No new complaints. No falls to report. Will be having eye surgery 1st thing in the morning.    Limitations Standing;Walking;House hold activities    How long can you sit comfortably? no issues    How long can you stand comfortably? <5 min    Patient Stated Goals Progress walking    Currently in Pain? No/denies    Pain Score 0-No pain              [facility] PT Assessment - 04/11/20 1414      Standardized Balance  Assessment   Standardized Balance Assessment Berg Balance Test;10 meter walk test    10 Meter Walk 9.70 sec's= 3.28f/sec with cane/bil AFO's      Berg Balance Test   Sit to Stand Able to stand without using hands and stabilize independently    Standing Unsupported Able to stand safely 2 minutes    Sitting with Back Unsupported but Feet Supported on Floor or Stool Able to sit safely and securely 2 minutes    Stand to Sit Sits safely with minimal use of hands    Transfers Able to transfer safely, minor use of hands    Standing Unsupported with Eyes Closed Able to stand 10 seconds safely    Standing Unsupported with Feet Together Able to place feet together independently and stand 1 minute safely    From Standing, Reach Forward with Outstretched Arm Can reach forward >12 cm safely (5")   8 inches   From Standing Position, Pick up Object from Floor Able to pick up shoe, needs supervision    From Standing Position, Turn to Look Behind Over each Shoulder Looks behind one side only/other side shows less weight shift   right>left   Turn 360 Degrees Able to  turn 360 degrees safely in 4 seconds or less    Standing Unsupported, Alternately Place Feet on Step/Stool Able to stand independently and safely and complete 8 steps in 20 seconds    Standing Unsupported, One Foot in Front Able to plae foot ahead of the other independently and hold 30 seconds    Standing on One Leg Able to lift leg independently and hold 5-10 seconds   left stance   Total Score 51    Berg comment: 51/56 with bil GRAFO on                [facility] Adult PT Treatment/Exercise - 04/11/20 1414      Transfers   Transfers Sit to Stand;Stand to Sit    Sit to Stand 6: Modified independent (Device/Increase time)    Stand to Sit 6: Modified independent (Device/Increase time)      Ambulation/Gait   Ambulation/Gait Yes    Ambulation/Gait Assistance 5: Supervision    Ambulation/Gait Assistance Details with cane around session     Assistive device Straight cane   bil AFO's; rubber quad tip   Gait Pattern Step-through pattern;Lateral hip instability;Narrow base of support    Ambulation Surface Level;Indoor    Stairs Yes    Stairs Assistance 4: Min guard    Stairs Assistance Details (indicate cue type and reason) pt alternated between reciprocal and step to pattern to ascend the stairs, step to to descend each time. increased guarding needed with descending    Stair Management Technique One rail Right;With cane;Alternating pattern;Step to pattern;Forwards    Number of Stairs 4   x3   Ramp Other (comment)   min guard assist   Curb 4: Min assist;Other (comment)   min guard assist   Curb Details (indicate cue type and reason) min assist to descend, min guard assist to ascend                    PT Short Term Goals - 02/15/20 1556      PT SHORT TERM GOAL #1   Title = LTG             PT Long Term Goals - 04/11/20 1442      PT LONG TERM GOAL #1   Title Patient will demo >54/56 on berg balance scale to improve functional balance and reduce fall risk    Baseline 04/11/20: 51/56 scored today    Time --    Period --    Status Not Met      PT LONG TERM GOAL #2   Title Patient will be able to ambulate >1000 feet outside with LRAD and bilat GRAFO to improve short distance community ambulation.    Baseline 04/11/20: unable to check goal due to weather outside today    Time --    Period --    Status Unable to assess      PT LONG TERM GOAL #3   Title Pt will improve gait velocity with LRAD and bilat GRAFO's to >/= 3.6 ft/sec    Baseline 04/11/20: 3.81 ft/sec    Time --    Period --    Status Achieved      PT LONG TERM GOAL #4   Title Pt will demonstrate ability to negotiate curb, ramp and 12 stairs with cane, bilat GRAFO's safely Mod I    Baseline 04/11/20: 12 steps with rail/cane, with min guard assist needed for saftey    Time --    Period --  Status Partially Met           New goals for  recertification set by primary PT:  PT Short Term Goals - 04/22/20 1210      PT SHORT TERM GOAL #1   Title Pt will improve functional aerobic endurance to perform elliptical x 5 minutes with Bioness in cycle mode without leaning heavily on UE for support    Baseline 2 minutes    Time 4    Period Weeks    Status New    Target Date 05/22/20           PT Long Term Goals - 04/22/20 1208      PT LONG TERM GOAL #1   Title Patient will demo >54/56 on berg balance scale wearing bilat GRAFO's to improve functional balance and reduce fall risk    Baseline 04/11/20: 51/56 scored today    Time 8    Period Weeks    Status Revised    Target Date 06/21/20      PT LONG TERM GOAL #2   Title Patient will be able to ambulate >1000 feet outside with cane and bilat GRAFO MOD I to improve short distance community ambulation.    Baseline 04/11/20: unable to check goal due to weather outside today    Time 8    Period Weeks    Status Revised    Target Date 06/21/20      PT LONG TERM GOAL #3   Title Pt will improve gait velocity with cane and bilat GRAFO's to >/= 4.0 ft/sec    Baseline 04/11/20: 3.81 ft/sec    Time 8    Period Weeks    Status Revised    Target Date 06/21/20      PT LONG TERM GOAL #4   Title Pt will demonstrate ability to negotiate curb, ramp and 12 stairs with cane, bilat GRAFO's safely Mod I    Baseline 04/11/20: 12 steps with rail/cane, with min guard assist needed for saftey    Time 8    Period Weeks    Status Revised    Target Date 06/21/20                  04/11/20 1411  Plan  Clinical Impression Statement Today's skilled session focused on progress toward LTGs for recert. Pt decreased on the Berg Balance Test to score 51/56. His gait speed increased to 3.81 ft/sec with cane/AFO's. Pt also met the HEP goal. They continue to need min guard to min assist for stairs, ramp and curbs with  cane/braces. The pt is making progress and should benefit from continued PT to  progress mobility and balance reactions.  Personal Factors and Comorbidities Age;Past/Current Experience;Comorbidity 2;Time since onset of injury/illness/exacerbation;Transportation;Comorbidity 1  Examination-Activity Limitations Bathing;Lift;Squat;Stairs;Stand;Transfers  Examination-Participation Restrictions Cleaning;Community Activity;Driving;Laundry;Occupation;Shop;Yard Work  Pt will benefit from skilled therapeutic intervention in order to improve on the following deficits Abnormal gait;Decreased activity tolerance;Decreased balance;Decreased mobility;Decreased endurance;Decreased coordination;Decreased strength;Difficulty walking;Impaired sensation;Impaired tone;Postural dysfunction;Pain  Rehab Potential Good  PT Frequency 2x / week  PT Duration 8 weeks  PT Treatment/Interventions ADLs/Self Care Home Management;Gait training;Stair training;Functional mobility training;Therapeutic activities;Therapeutic exercise;Balance training;Neuromuscular re-education;Patient/family education;Passive range of motion;Energy conservation;Electrical Stimulation;DME Instruction;Orthotic Fit/Training;Aquatic Therapy  PT Next Visit Plan Land based PT: continue to work on functional E-stim as appropriate. Work on stepping strategies when outside LOS with balance execises/strategies, work on tandem and SLS balances. Elliptical with Bioness: Continue to progress as tolerated.  PT Home Exercise Plan Access Code: [email or web address].  plus heel raises, and HS curls verbally added. Pt stated able to recall these without new handouts.  Consulted and Agree with Plan of Care Patient    Patient will benefit from skilled therapeutic intervention in order to improve the following deficits and impairments:  Abnormal gait,Decreased activity tolerance,Decreased balance,Decreased mobility,Decreased endurance,Decreased coordination,Decreased strength,Difficulty walking,Impaired sensation,Impaired tone,Postural  dysfunction,Pain  Visit Diagnosis: Muscle weakness (generalized)  Other abnormalities of gait and mobility  Unsteadiness on feet  Difficulty in walking, not elsewhere classified  Other disturbances of skin sensation  Other lack of coordination     Problem List Patient Active Problem List   Diagnosis Date Noted  . Incomplete paraplegia ([place]) 11/08/2019  . Foot drop, bilateral 11/08/2019  . Spasticity 08/18/2019  . Chronic pain syndrome 08/18/2019  . Nerve pain 08/18/2019  . Clonus 07/21/2019  . Spinal cord tumor   . Pain   . Transaminitis   . Leukocytosis   . Neurogenic bowel   . Postoperative pain   . 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 Acute incomplete quadriplegia ([facility])   . 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 Numbness 05/10/2019  . Weakness of left lower extremity 05/10/2019  . 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 Visual disturbance 05/10/2019    Willow Ora, PTA, [facility] [address] [place], [place] [address] [phone] 04/11/20, 10:27 PM   Rico Junker, PT, DPT 04/22/20    12:13 PM     Name: Jacob Lambert MRN: [id number] Date of Birth: 05-20-1985

## 2020-04-12 DIAGNOSIS — H18621 Keratoconus, unstable, right eye: Secondary | ICD-10-CM | POA: Diagnosis not present

## 2020-04-22 ENCOUNTER — Encounter: Payer: Self-pay | Admitting: Physical Therapy

## 2020-04-22 ENCOUNTER — Other Ambulatory Visit: Payer: Self-pay

## 2020-04-22 ENCOUNTER — Ambulatory Visit: Payer: BC Managed Care – PPO | Admitting: Physical Therapy

## 2020-04-22 DIAGNOSIS — R2689 Other abnormalities of gait and mobility: Secondary | ICD-10-CM | POA: Diagnosis not present

## 2020-04-22 DIAGNOSIS — R208 Other disturbances of skin sensation: Secondary | ICD-10-CM | POA: Diagnosis not present

## 2020-04-22 DIAGNOSIS — H18621 Keratoconus, unstable, right eye: Secondary | ICD-10-CM | POA: Diagnosis not present

## 2020-04-22 DIAGNOSIS — R262 Difficulty in walking, not elsewhere classified: Secondary | ICD-10-CM | POA: Diagnosis not present

## 2020-04-22 DIAGNOSIS — R293 Abnormal posture: Secondary | ICD-10-CM | POA: Diagnosis not present

## 2020-04-22 DIAGNOSIS — R2681 Unsteadiness on feet: Secondary | ICD-10-CM | POA: Diagnosis not present

## 2020-04-22 DIAGNOSIS — M6281 Muscle weakness (generalized): Secondary | ICD-10-CM

## 2020-04-22 DIAGNOSIS — R278 Other lack of coordination: Secondary | ICD-10-CM | POA: Diagnosis not present

## 2020-04-22 NOTE — Addendum Note (Signed)
Addended by: Misty Stanley F on: 04/22/2020 12:16 PM   Modules accepted: Orders

## 2020-04-22 NOTE — Therapy (Signed)
[facility] [address] [place], [place], [address] Phone: [phone]   Fax:  [phone]  Physical Therapy Treatment  Patient Details  Name: Jacob Lambert MRN: [id number] Date of Birth: [birth year] Referring Provider (PT): Dr. Courtney Heys   Encounter Date: 04/22/2020   PT End of Session - 04/22/20 1323    Visit Number 52    Number of Visits 52    Date for PT Re-Evaluation 06/21/20    Authorization Type [employer] - VL: 30 PT/OT combined    Authorization - Visit Number 24    Authorization - Number of Visits 30    PT Start Time 1319    PT Stop Time 1400    PT Time Calculation (min) 41 min    Equipment Utilized During Treatment Other (comment)   bil AFO's   Activity Tolerance Patient tolerated treatment well    Behavior During Therapy WFL for tasks assessed/performed           Past Medical History:  Diagnosis Date  . Anxiety   . Clonus   . Depression   . Headache   . Pneumonia    "walking"  . Quadriplegia and quadriparesis ([place])   . Spasticity   . Spinal cord tumor   . Wheelchair bound     Past Surgical History:  Procedure Laterality Date  . LAMINECTOMY N/A 06/01/2019   Procedure: Cervical six-seven Laminoplasty, biopsy and resection of intramedullary spinal cord tumor;  Surgeon: [name], MD;  Location: [facility];  Service: Neurosurgery;  Laterality: N/A;  . NO PAST SURGERIES      There were no vitals filed for this visit.   Subjective Assessment - 04/22/20 1320    Subjective Had the eye surgery. Still recovering, see's the eye doctor today after PT. Has the corrective contact in right now. Has an eye patch he wears for now as well. No falls.    Limitations Standing;Walking;House hold activities    How long can you sit comfortably? no issues    How long can you stand comfortably? <5 min    Patient Stated Goals Progress walking    Currently in Pain? No/denies    Pain Score 0-No pain                 [facility] Adult PT Treatment/Exercise - 04/22/20 1325      Transfers   Transfers Sit to Stand;Stand to Sit    Sit to Stand 6: Modified independent (Device/Increase time)    Stand to Sit 6: Modified independent (Device/Increase time)      Ambulation/Gait   Ambulation/Gait Yes    Ambulation/Gait Assistance 5: Supervision    Ambulation/Gait Assistance Details with cane around gym    Assistive device Straight cane    Gait Pattern Step-through pattern;Lateral hip instability;Narrow base of support    Ambulation Surface Level;Indoor      Knee/Hip Exercises: Aerobic   Elliptical with bil kAFO's for 3 minutes forward with UE support on level 1.0               Balance Exercises - 04/22/20 1332      Balance Exercises: Standing   Stepping Strategy Anterior;Posterior;Lateral;Limitations    Stepping Strategy Limitations 4 square stepping no AD on floor for 5 laps each way with min guard to min assist for balance; on red mat with cane progressing to no AD for last 2 laps going forward 1st (total of 5 laps); then no AD with stepping laterally 1st (  opposite direction) for 5 laps, min guard to min assist for balance.    Rockerboard Anterior/posterior;Lateral;EO;EC;30 seconds;Other reps (comment);Limitations    Rockerboard Limitations performed on balance board both ways, holding the board steady- alternating UE raises progressing to bil UE raises for 5-8 reps each; then with arms at sides for EC 30 sec's x 3 reps. min to mod assist at times with cues on posture/weight shifting for balance.               PT Short Term Goals - 04/22/20 1210      PT SHORT TERM GOAL #1   Title Pt will improve functional aerobic endurance to perform elliptical x 5 minutes with Bioness in cycle mode without leaning heavily on UE for support    Baseline 2 minutes    Time 4    Period Weeks    Status New    Target Date 05/22/20             PT Long Term Goals - 04/22/20 1208      PT LONG TERM  GOAL #1   Title Patient will demo >54/56 on berg balance scale wearing bilat GRAFO's to improve functional balance and reduce fall risk    Baseline 04/11/20: 51/56 scored today    Time 8    Period Weeks    Status Revised    Target Date 06/21/20      PT LONG TERM GOAL #2   Title Patient will be able to ambulate >1000 feet outside with cane and bilat GRAFO MOD I to improve short distance community ambulation.    Baseline 04/11/20: unable to check goal due to weather outside today    Time 8    Period Weeks    Status Revised    Target Date 06/21/20      PT LONG TERM GOAL #3   Title Pt will improve gait velocity with cane and bilat GRAFO's to >/= 4.0 ft/sec    Baseline 04/11/20: 3.81 ft/sec    Time 8    Period Weeks    Status Revised    Target Date 06/21/20      PT LONG TERM GOAL #4   Title Pt will demonstrate ability to negotiate curb, ramp and 12 stairs with cane, bilat GRAFO's safely Mod I    Baseline 04/11/20: 12 steps with rail/cane, with min guard assist needed for saftey    Time 8    Period Weeks    Status Revised    Target Date 06/21/20                 Plan - 04/22/20 1324    Clinical Impression Statement Today's skilled session focused on use of bil AFO's with strengthening and balance training with no issues noted or reported in session. Progressed balance on complaint surfaces with decreased UE support this session with up to min/mod assist needed (increased assistance with vision removed). The pt is making steady progress toward goals and should benefit form continued PT to progress toward unmet goals.    Personal Factors and Comorbidities Age;Past/Current Experience;Comorbidity 2;Time since onset of injury/illness/exacerbation;Transportation;Comorbidity 1    Examination-Activity Limitations Bathing;Lift;Squat;Stairs;Stand;Transfers    Examination-Participation Restrictions Cleaning;Community Activity;Driving;Laundry;Occupation;Shop;Yard Work    Rehab Potential Good     PT Frequency 2x / week    PT Duration 8 weeks    PT Treatment/Interventions ADLs/Self Care Home Management;Gait training;Stair training;Functional mobility training;Therapeutic activities;Therapeutic exercise;Balance training;Neuromuscular re-education;Patient/family education;Passive range of motion;Energy conservation;Electrical Stimulation;DME Instruction;Orthotic Fit/Training;Aquatic Therapy  PT Next Visit Plan Land based PT: continue to work on functional E-stim as appropriate. Work on stepping strategies when outside LOS with balance execises/strategies, work on tandem and SLS balances. Elliptical with Bioness: Continue to progress as tolerated.    PT Home Exercise Plan Access Code: [email or web address]; [email or web address]. plus heel raises, and HS curls verbally added. Pt stated able to recall these without new handouts.    Consulted and Agree with Plan of Care Patient           Patient will benefit from skilled therapeutic intervention in order to improve the following deficits and impairments:  Abnormal gait,Decreased activity tolerance,Decreased balance,Decreased mobility,Decreased endurance,Decreased coordination,Decreased strength,Difficulty walking,Impaired sensation,Impaired tone,Postural dysfunction,Pain  Visit Diagnosis: Muscle weakness (generalized)  Other abnormalities of gait and mobility  Unsteadiness on feet  Difficulty in walking, not elsewhere classified     Problem List Patient Active Problem List   Diagnosis Date Noted  . Incomplete paraplegia ([place]) 11/08/2019  . Foot drop, bilateral 11/08/2019  . Spasticity 08/18/2019  . Chronic pain syndrome 08/18/2019  . Nerve pain 08/18/2019  . Clonus 07/21/2019  . Spinal cord tumor   . Pain   . Transaminitis   . Leukocytosis   . Neurogenic bowel   . Postoperative pain   . 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 Acute incomplete quadriplegia ([facility])   . 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 Numbness 05/10/2019  . Weakness of left lower  extremity 05/10/2019  . 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 Visual disturbance 05/10/2019    Willow Ora, PTA, [facility] [address] [place], [place] [address] [phone] 04/22/20, 3:18 PM   Name: Jacob Lambert MRN: [id number] Date of Birth: 03/03/1985

## 2020-04-30 DIAGNOSIS — R300 Dysuria: Secondary | ICD-10-CM | POA: Diagnosis not present

## 2020-04-30 DIAGNOSIS — N411 Chronic prostatitis: Secondary | ICD-10-CM | POA: Diagnosis not present

## 2020-04-30 DIAGNOSIS — N5312 Painful ejaculation: Secondary | ICD-10-CM | POA: Diagnosis not present

## 2020-05-10 ENCOUNTER — Telehealth: Payer: BC Managed Care – PPO | Admitting: Physician Assistant

## 2020-05-10 ENCOUNTER — Ambulatory Visit: Payer: BC Managed Care – PPO | Admitting: Physical Therapy

## 2020-05-10 ENCOUNTER — Encounter: Payer: Self-pay | Admitting: Physician Assistant

## 2020-05-10 DIAGNOSIS — U071 COVID-19: Secondary | ICD-10-CM | POA: Diagnosis not present

## 2020-05-10 MED ORDER — FLUTICASONE PROPIONATE 50 MCG/ACT NA SUSP: 2.0000 | Freq: Every day | NASAL | 0 refills | Status: AC

## 2020-05-10 MED ORDER — BENZONATATE 100 MG PO CAPS: 100.0000 mg | ORAL_CAPSULE | Freq: Three times a day (TID) | ORAL | 0 refills | Status: DC | PRN

## 2020-05-10 NOTE — Patient Instructions (Signed)
Hello Jacob "B",  You are being placed in the home monitoring program for COVID-19 (commonly known as Coronavirus).  This is because you are suspected to have the virus or are known to have the virus.  If you are unsure which group you fall into call your clinic.    As part of this program, you'll answer a daily questionnaire in the MyChart mobile app. You'll receive a notification through the MyChart app when the questionnaire is available. When you log in to MyChart, you'll see the tasks in your To Do activity.       Clinicians will see any answers that are concerning and take appropriate steps.  If at any point you are having a medical emergency, call 911.  If otherwise concerned call your clinic instead of coming into the clinic or hospital.  To keep from spreading the disease you should: Stay home and limit contact with other people as much as possible.  Wash your hands frequently. Cover your coughs and sneezes with a tissue, and throw used tissues in the trash.   Clean and disinfect frequently touched surfaces and objects.    Take care of yourself by: Staying home Resting Drinking fluids Take fever-reducing medications (Tylenol/Acetaminophen and Ibuprofen)  For more information on the disease go to the Centers for Disease Control and Prevention website     Can take to lessen severity: Vit C 500mg  twice daily Quercertin 250-500mg  twice daily Zinc 75-100mg  daily Melatonin 3-6 mg at bedtime Vit D3 1000-2000 IU daily Aspirin 81 mg daily with food Optional: Famotidine 20mg  daily Also can add tylenol/ibuprofen as needed for fevers and body aches May add Mucinex or Mucinex DM as needed for cough/congestion   10 Things You Can Do to Manage Your COVID-19 Symptoms at Home If you have possible or confirmed COVID-19: 1. Stay home except to get medical care. 2. Monitor your symptoms carefully. If your symptoms get worse, call your healthcare provider immediately. 3. Get rest and  stay hydrated. 4. If you have a medical appointment, call the healthcare provider ahead of time and tell them that you have or may have COVID-19. 5. For medical emergencies, call 911 and notify the dispatch personnel that you have or may have COVID-19. 6. Cover your cough and sneezes with a tissue or use the inside of your elbow. 7. Wash your hands often with soap and water for at least 20 seconds or clean your hands with an alcohol-based hand sanitizer that contains at least 60% alcohol. 8. As much as possible, stay in a specific room and away from other people in your home. Also, you should use a separate bathroom, if available. If you need to be around other people in or outside of the home, wear a mask. 9. Avoid sharing personal items with other people in your household, like dishes, towels, and bedding. 10. Clean all surfaces that are touched often, like counters, tabletops, and doorknobs. Use household cleaning sprays or wipes according to the label instructions. June 07/21/2019 This information is not intended to replace advice given to you by your health care provider. Make sure you discuss any questions you have with your health care provider. Document Revised: 11/06/2019 Document Reviewed: 11/06/2019 Elsevier Patient Education  2021 [place] [employer]: What to Do if You Are Sick If you have a fever, cough or other symptoms, you might have COVID-19. Most people have mild illness and are able to recover at home. If you are sick:  Keep track of  your symptoms.  If you have an emergency warning sign (including trouble breathing), call 911. Steps to help prevent the spread of COVID-19 if you are sick If you are sick with COVID-19 or think you might have COVID-19, follow the steps below to care for yourself and to help protect other people in your home and community. Stay home except to get medical care  Stay home. Most people with COVID-19 have mild illness and can  recover at home without medical care. Do not leave your home, except to get medical care. Do not visit public areas.  Take care of yourself. Get rest and stay hydrated. Take over-the-counter medicines, such as acetaminophen, to help you feel better.  Stay in touch with your doctor. Call before you get medical care. Be sure to get care if you have trouble breathing, or have any other emergency warning signs, or if you think it is an emergency.  Avoid public transportation, ride-sharing, or taxis. Separate yourself from other people As much as possible, stay in a specific room and away from other people and pets in your home. If possible, you should use a separate bathroom. If you need to be around other people or animals in or outside of the home, wear a mask. Tell your close contactsthat they may have been exposed to COVID-19. An infected person can spread COVID-19 starting 48 hours (or 2 days) before the person has any symptoms or tests positive. By letting your close contacts know they may have been exposed to COVID-19, you are helping to protect everyone.  Additional guidance is available for those living in close quarters and shared housing.  See COVID-19 and Animals if you have questions about pets.  If you are diagnosed with COVID-19, someone from the health department may call you. Answer the call to slow the spread. Monitor your symptoms  Symptoms of COVID-19 include fever, cough, or other symptoms.  Follow care instructions from your healthcare provider and local health department. Your local health authorities may give instructions on checking your symptoms and reporting information. When to seek emergency medical attention Look for emergency warning signs* for COVID-19. If someone is showing any of these signs, seek emergency medical care immediately:  Trouble breathing  Persistent pain or pressure in the chest  New confusion  Inability to wake or stay awake  Pale, gray, or  blue-colored skin, lips, or nail beds, depending on skin tone *This list is not all possible symptoms. Please call your medical provider for any other symptoms that are severe or concerning to you. Call 911 or call ahead to your local emergency facility: Notify the operator that you are seeking care for someone who has or may have COVID-19. Call ahead before visiting your doctor  Call ahead. Many medical visits for routine care are being postponed or done by phone or telemedicine.  If you have a medical appointment that cannot be postponed, call your doctor's office, and tell them you have or may have COVID-19. This will help the office protect themselves and other patients. Get  tested  If you have symptoms of COVID-19, get tested. While waiting for test results, you stay away from others, including staying apart from those living in your household.  You can visit your state, tribal, local, and territorialhealth department's website to look for the latest local information on testing sites. If you are sick, wear a mask over your nose and mouth  You should wear a mask over your nose and mouth if you  must be around other people or animals, including pets (even at home).  You don't need to wear the mask if you are alone. If you can't put on a mask (because of trouble breathing, for example), cover your coughs and sneezes in some other way. Try to stay at least 6 feet away from other people. This will help protect the people around you.  Masks should not be placed on young children under age 46 years, anyone who has trouble breathing, or anyone who is not able to remove the mask without help. Note: During the COVID-19 pandemic, medical grade facemasks are reserved for healthcare workers and some first responders. Cover your coughs and sneezes  Cover your mouth and nose with a tissue when you cough or sneeze.  Throw away used tissues in a lined trash can.  Immediately wash your hands with soap  and water for at least 20 seconds. If soap and water are not available, clean your hands with an alcohol-based hand sanitizer that contains at least 60% alcohol. Clean your hands often  Wash your hands often with soap and water for at least 20 seconds. This is especially important after blowing your nose, coughing, or sneezing; going to the bathroom; and before eating or preparing food.  Use hand sanitizer if soap and water are not available. Use an alcohol-based hand sanitizer with at least 60% alcohol, covering all surfaces of your hands and rubbing them together until they feel dry.  Soap and water are the best option, especially if hands are visibly dirty.  Avoid touching your eyes, nose, and mouth with unwashed hands.  Handwashing Tips Avoid sharing personal household items  Do not share dishes, drinking glasses, cups, eating utensils, towels, or bedding with other people in your home.  Wash these items thoroughly after using them with soap and water or put in the dishwasher. Clean all "high-touch" surfaces everyday  Clean and disinfect high-touch surfaces in your "sick room" and bathroom; wear disposable gloves. Let someone else clean and disinfect surfaces in common areas, but you should clean your bedroom and bathroom, if possible.  If a caregiver or other person needs to clean and disinfect a sick person's bedroom or bathroom, they should do so on an as-needed basis. The caregiver/other person should wear a mask and disposable gloves prior to cleaning. They should wait as long as possible after the person who is sick has used the bathroom before coming in to clean and use the bathroom. ? High-touch surfaces include phones, remote controls, counters, tabletops, doorknobs, bathroom fixtures, toilets, keyboards, tablets, and bedside tables.  Clean and disinfect areas that may have blood, stool, or body fluids on them.  Use household cleaners and disinfectants. Clean the area or item  with soap and water or another detergent if it is dirty. Then, use a household disinfectant. ? Be sure to follow the instructions on the label to ensure safe and effective use of the product. Many products recommend keeping the surface wet for several minutes to ensure germs are killed. Many also recommend precautions such as wearing gloves and making sure you have good ventilation during use of the product. ? Use a product from [employer] List N: Disinfectants for Coronavirus (JKKXF-81). ? Complete Disinfection Guidance When you can be around others after being sick with COVID-19 Deciding when you can be around others is different for different situations. Find out when you can safely end home isolation. For any additional questions about your care, contact your healthcare provider or  state or local health department. 03/22/2019 Content source: [facility] for Immunization and Respiratory Diseases ([facility]), Division of Viral Diseases This information is not intended to replace advice given to you by your health care provider. Make sure you discuss any questions you have with your health care provider. Document Revised: 11/06/2019 Document Reviewed: 11/06/2019 Elsevier Patient Education  2021 [employer].

## 2020-05-10 NOTE — Progress Notes (Signed)
[place],you are scheduled for a virtual visit with your provider today.    Just as we do with appointments in the office, we must obtain your consent to participate.  Your consent will be active for this visit and any virtual visit you may have with one of our providers in the next 365 days.    If you have a MyChart account, I can also send a copy of this consent to you electronically.  All virtual visits are billed to your insurance company just like a traditional visit in the office.  As this is a virtual visit, video technology does not allow for your provider to perform a traditional examination.  This may limit your provider's ability to fully assess your condition.  If your provider identifies any concerns that need to be evaluated in person or the need to arrange testing such as labs, EKG, etc, we will make arrangements to do so.    Although advances in technology are sophisticated, we cannot ensure that it will always work on either your end or our end.  If the connection with a video visit is poor, we may have to switch to a telephone visit.  With either a video or telephone visit, we are not always able to ensure that we have a secure connection.   I need to obtain your verbal consent now.   Are you willing to proceed with your visit today?   Jacob Lambert has provided verbal consent on 05/10/2020 for a virtual visit (video or telephone).   Mar Daring, PA-C 05/10/2020  1:07 PM    MyChart Video Visit    Virtual Visit via Video Note   This visit type was conducted due to national recommendations for restrictions regarding the COVID-19 Pandemic (e.g. social distancing) in an effort to limit this patient's exposure and mitigate transmission in our community. This patient is at least at moderate risk for complications without adequate follow up. This format is felt to be most appropriate for this patient at this time. Physical exam was limited by quality of the video and audio  technology used for the visit.   Patient location: Home Provider location: Home office in [place]  I discussed the limitations of evaluation and management by telemedicine and the availability of in person appointments. The patient expressed understanding and agreed to proceed.  Patient: Jacob Lambert   DOB: [birth year]   35 y.o. Adult  MRN: [id number] Visit Date: 05/10/2020  Today's healthcare provider: Mar Daring, PA-C   No chief complaint on file.  Subjective    URI  This is a new problem. The current episode started in the past 7 days (Tuesday symptoms started with coughing, took at home test on Thursday and was negative, then retested today and was positive. Had rrecently traveled to [place] and flew back on Monday.). The problem has been gradually worsening. There has been no fever. Associated symptoms include congestion, coughing (dry, barky), ear pain (fullness), headaches, joint pain, rhinorrhea, sinus pain, a sore throat, swollen glands and wheezing (reports a small amount of wheezing sensation). Pertinent negatives include no chest pain, nausea, rash or vomiting. Jacob Cara "B" has tried acetaminophen, decongestant, increased fluids and sleep (Mucinex Fast max) for the symptoms. The treatment provided no relief.    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   Patient Active Problem List   Diagnosis Date Noted  . Incomplete paraplegia ([place]) 11/08/2019  .  Foot drop, bilateral 11/08/2019  . Spasticity 08/18/2019  . Chronic pain syndrome 08/18/2019  . Nerve pain 08/18/2019  . Clonus 07/21/2019  . Spinal cord tumor   . Pain   . Transaminitis   . Leukocytosis   . Neurogenic bowel   . Postoperative pain   . Ependymoma ([place]) 06/08/2019  . Acute incomplete quadriplegia ([facility])   . Spinal cord ependymoma ([place]) 05/23/2019  . Numbness 05/10/2019  . Weakness of left lower extremity 05/10/2019  . Hyperreflexia  05/10/2019  . Visual disturbance 05/10/2019   Past Medical History:  Diagnosis Date  . Anxiety   . Clonus   . Depression   . Headache   . Pneumonia    "walking"  . Quadriplegia and quadriparesis ([facility])   . Spasticity   . Spinal cord tumor   . Wheelchair bound       Medications: Outpatient Medications Prior to Visit  Medication Sig  . acetaminophen (TYLENOL) 325 MG tablet Take 2 tablets (650 mg total) by mouth every 4 (four) hours as needed for mild pain ((score 1 to 3) or temp > 100.5).  . baclofen (LIORESAL) 10 MG tablet Take 1 tablet (10 mg total) by mouth 4 (four) times daily.  . bisacodyl (DULCOLAX) 10 MG suppository Place 1 suppository (10 mg total) rectally at bedtime.  . citalopram (CELEXA) 40 MG tablet Take 1 tablet (40 mg total) by mouth daily.  . dantrolene (DANTRIUM) 100 MG capsule Take 1 capsule (100 mg total) by mouth 3 (three) times daily.  Marland Kitchen dexamethasone (DECADRON) 4 MG tablet  (Patient not taking: Reported on 04/01/2020)  . diazepam (VALIUM) 10 MG tablet Take 1 tablet (10 mg total) by mouth every 6 (six) hours as needed.  . docusate sodium (COLACE) 100 MG capsule Take 1 capsule (100 mg total) by mouth 3 (three) times daily.  Marland Kitchen gabapentin (NEURONTIN) 600 MG tablet Take 1 tablet (600 mg total) by mouth 3 (three) times daily.  Marland Kitchen lidocaine (LIDODERM) 5 % Place 1 patch onto the skin daily. Remove & Discard patch within 12 hours or as directed by MD (Patient taking differently: Place 1 patch onto the skin daily. Remove & Discard patch within 12 hours or as directed by MD states he is using 4% patch)  . methocarbamol (ROBAXIN) 500 MG tablet  (Patient not taking: Reported on 04/01/2020)  . Multiple Vitamin (MULTIVITAMIN WITH MINERALS) TABS tablet Take 1 tablet by mouth daily.   . polyethylene glycol (MIRALAX / GLYCOLAX) 17 g packet Take 17 g by mouth 3 (three) times daily. (Patient taking differently: Take 17 g by mouth 2 (two) times daily.)  . senna-docusate (SENOKOT-S)  8.6-50 MG tablet Take 2 tablets by mouth at bedtime.  . sildenafil (VIAGRA) 50 MG tablet Take 1 tablet (50 mg total) by mouth as needed for erectile dysfunction.  Marland Kitchen tiZANidine (ZANAFLEX) 2 MG tablet Take 1 tablet (2 mg total) by mouth 2 (two) times daily as needed for muscle spasms.   No facility-administered medications prior to visit.    Review of Systems  Constitutional: Positive for chills, fatigue and fever (mild). Negative for appetite change.  HENT: Positive for congestion, ear pain (fullness), postnasal drip, rhinorrhea, sinus pressure, sinus pain and sore throat.   Respiratory: Positive for cough (dry, barky), chest tightness and wheezing (reports a small amount of wheezing sensation). Negative for shortness of breath.   Cardiovascular: Negative for chest pain.  Gastrointestinal: Negative for nausea and vomiting.  Musculoskeletal: Positive for joint pain.  Skin: Negative  for rash.  Neurological: Positive for headaches. Negative for dizziness.    Last CBC Lab Results  Component Value Date   WBC 8.4 06/27/2019   HGB 13.2 06/27/2019   HCT 41.8 06/27/2019   MCV 87.1 06/27/2019   MCH 27.5 06/27/2019   RDW 13.4 06/27/2019   PLT 284 [id number]   Last metabolic panel Lab Results  Component Value Date   GLUCOSE 130 (H) 12/06/2019   NA 139 12/06/2019   K 4.8 12/06/2019   CL 99 12/06/2019   CO2 27 12/06/2019   BUN 13 12/06/2019   CREATININE 0.97 12/06/2019   GFRNONAA 101 12/06/2019   GFRAA 117 12/06/2019   CALCIUM 9.8 12/06/2019   PROT 7.6 12/06/2019   ALBUMIN 4.8 12/06/2019   LABGLOB 2.8 12/06/2019   AGRATIO 1.7 12/06/2019   BILITOT 0.4 12/06/2019   ALKPHOS 45 12/06/2019   AST 18 12/06/2019   ALT 22 12/06/2019   ANIONGAP 8 06/27/2019      Objective    There were no vitals taken for this visit. BP Readings from Last 3 Encounters:  04/01/20 125/80  03/06/20 105/71  12/25/19 126/75   Wt Readings from Last 3 Encounters:  04/01/20 158 lb 4.8 oz (71.8 kg)   03/06/20 155 lb 6.4 oz (70.5 kg)  01/26/20 144 lb (65.3 kg)      Physical Exam Vitals reviewed.  Constitutional:      General: Jacob Cara "B" is not in acute distress.    Appearance: Normal appearance. Jacob Cara "B" is well-developed. Jacob Cara "B" is ill-appearing.  HENT:     Head: Normocephalic and atraumatic.  Eyes:     Conjunctiva/sclera: Conjunctivae normal.  Pulmonary:     Effort: Pulmonary effort is normal. No respiratory distress (frequent, dry, barking cough heard without impairing speaking).  Musculoskeletal:     Cervical back: Normal range of motion and neck supple.  Neurological:     Mental Status: Jacob Zafar "B" is alert.  Psychiatric:        Mood and Affect: Mood normal.        Behavior: Behavior normal.        Thought Content: Thought content normal.        Judgment: Judgment normal.       Assessment & Plan     1. COVID-19 - Symptoms started on Tuesday after returning from a trip to [place].  - Positive test was today (had a negative at home test on Thursday, 05/09/20) - Discussed continuing OTC treatment for symptomatic relief - Will add tessalon perles and flonase for cough and nasal congestion respectively - Referral to Covid treatment team placed, consult appreciated - Push fluids - Return precautions provided via AVS - benzonatate (TESSALON) 100 MG capsule; Take 1 capsule (100 mg total) by mouth 3 (three) times daily as needed for cough.  Dispense: 30 capsule; Refill: 0 - fluticasone (FLONASE) 50 MCG/ACT nasal spray; Place 2 sprays into both nostrils daily.  Dispense: 16 g; Refill: 0   No follow-ups on file.     I discussed the assessment and treatment plan with the patient. The patient was provided an opportunity to ask questions and all were answered. The patient agreed with the plan and demonstrated an understanding of the instructions.   The patient was advised to call back or seek an in-person evaluation if the symptoms  worsen or if the condition fails to improve as anticipated.  I provided 16 minutes of face-to-face time during this encounter  via MyChart Video enabled encounter.   [place] [phone] (phone) [phone] (fax)  [name]

## 2020-05-11 ENCOUNTER — Telehealth: Payer: Self-pay | Admitting: Unknown Physician Specialty

## 2020-05-11 MED ORDER — NIRMATRELVIR/RITONAVIR (PAXLOVID)TABLET: 3.0000 | ORAL_TABLET | Freq: Two times a day (BID) | ORAL | 0 refills | Status: AC

## 2020-05-11 NOTE — Telephone Encounter (Signed)
Outpatient Oral COVID Treatment Note  I connected with Jacob Lambert on 0/02/5425/06:23 AM by telephone and verified that I am speaking with the correct person using two identifiers.  I discussed the limitations, risks, security, and privacy concerns of performing an evaluation and management service by telephone and the availability of in person appointments. I also discussed with the patient that there may be a patient responsible charge related to this service. The patient expressed understanding and agreed to proceed.  Patient location: HOME Provider location: HOME  Diagnosis: COVID-19 infection  Purpose of visit: Discussion of potential use of Molnupiravir or Paxlovid, a new treatment for mild to moderate COVID-19 viral infection in non-hospitalized patients.   Subjective: Patient is a 35 y.o. adult who has been diagnosed with COVID 19 viral infection.  Their symptoms began on 5/4 with COUGH.    Past Medical History:  Diagnosis Date  . Anxiety   . Clonus   . Depression   . Headache   . Pneumonia    "walking"  . Quadriplegia and quadriparesis ([facility])   . Spasticity   . Spinal cord tumor   . Wheelchair bound     Allergies  Allergen Reactions  . Cephalosporins Hives  . Other     Patagonion toothfish     Current Outpatient Medications:  .  nirmatrelvir/ritonavir EUA (PAXLOVID) TABS, Take 3 tablets by mouth 2 (two) times daily for 5 days. Patient GFR is >60. Take nirmatrelvir (150 mg) 2 tablet(s) twice daily for 5 days and ritonavir (100 mg) one tablet twice daily for 5 days., Disp: 30 tablet, Rfl: 0 .  acetaminophen (TYLENOL) 325 MG tablet, Take 2 tablets (650 mg total) by mouth every 4 (four) hours as needed for mild pain ((score 1 to 3) or temp > 100.5)., Disp: , Rfl:  .  baclofen (LIORESAL) 10 MG tablet, Take 1 tablet (10 mg total) by mouth 4 (four) times daily., Disp: 360 each, Rfl: 1 .  benzonatate (TESSALON) 100 MG capsule, Take 1 capsule (100 mg total) by mouth 3  (three) times daily as needed for cough., Disp: 30 capsule, Rfl: 0 .  bisacodyl (DULCOLAX) 10 MG suppository, Place 1 suppository (10 mg total) rectally at bedtime., Disp: 12 suppository, Rfl: 0 .  citalopram (CELEXA) 40 MG tablet, Take 1 tablet (40 mg total) by mouth daily., Disp: 90 tablet, Rfl: 3 .  dantrolene (DANTRIUM) 100 MG capsule, Take 1 capsule (100 mg total) by mouth 3 (three) times daily., Disp: 270 capsule, Rfl: 1 .  dexamethasone (DECADRON) 4 MG tablet, , Disp: , Rfl:  .  diazepam (VALIUM) 10 MG tablet, Take 1 tablet (10 mg total) by mouth every 6 (six) hours as needed., Disp: 30 tablet, Rfl: 0 .  docusate sodium (COLACE) 100 MG capsule, Take 1 capsule (100 mg total) by mouth 3 (three) times daily., Disp: 10 capsule, Rfl: 0 .  fluticasone (FLONASE) 50 MCG/ACT nasal spray, Place 2 sprays into both nostrils daily., Disp: 16 g, Rfl: 0 .  gabapentin (NEURONTIN) 600 MG tablet, Take 1 tablet (600 mg total) by mouth 3 (three) times daily., Disp: 270 tablet, Rfl: 1 .  lidocaine (LIDODERM) 5 %, Place 1 patch onto the skin daily. Remove & Discard patch within 12 hours or as directed by MD (Patient taking differently: Place 1 patch onto the skin daily. Remove & Discard patch within 12 hours or as directed by MD states he is using 4% patch), Disp: 30 patch, Rfl: 0 .  methocarbamol (ROBAXIN) 500  MG tablet, , Disp: , Rfl:  .  Multiple Vitamin (MULTIVITAMIN WITH MINERALS) TABS tablet, Take 1 tablet by mouth daily. , Disp: , Rfl:  .  polyethylene glycol (MIRALAX / GLYCOLAX) 17 g packet, Take 17 g by mouth 3 (three) times daily. (Patient taking differently: Take 17 g by mouth 2 (two) times daily.), Disp: 14 each, Rfl: 0 .  senna-docusate (SENOKOT-S) 8.6-50 MG tablet, Take 2 tablets by mouth at bedtime., Disp:  , Rfl:  .  sildenafil (VIAGRA) 50 MG tablet, Take 1 tablet (50 mg total) by mouth as needed for erectile dysfunction., Disp: 10 tablet, Rfl: 3 .  tiZANidine (ZANAFLEX) 2 MG tablet, Take 1 tablet (2  mg total) by mouth 2 (two) times daily as needed for muscle spasms., Disp: 60 tablet, Rfl: 1  Objective: Patient appears/sounds well.  They are in no apparent distress.  Breathing is non labored.  Mood and behavior are normal.  Laboratory Data:  No results found for this or any previous visit (from the past 2160 hour(s)).   Assessment: 35 y.o. adult with mild/moderate COVID 19 viral infection diagnosed on 5/4 at high risk for progression to severe COVID 19.  Plan:  This patient is a 35 y.o. adult that meets the following criteria for Emergency Use Authorization of: Paxlovid 1. Age >12 yr AND > 40 kg 2. SARS-COV-2 positive test 3. Symptom onset < 5 days 4. Mild-to-moderate COVID disease with high risk for severe progression to hospitalization or death  I have spoken and communicated the following to the patient or parent/caregiver regarding: 1. Paxlovid is an unapproved drug that is authorized for use under an Emergency Use Authorization.  2. There are no adequate, approved, available products for the treatment of COVID-19 in adults who have mild-to-moderate COVID-19 and are at high risk for progressing to severe COVID-19, including hospitalization or death. 3. Other therapeutics are currently authorized. For additional information on all products authorized for treatment or prevention of COVID-19, please see [employer].  4. There are benefits and risks of taking this treatment as outlined in the "Fact Sheet for Patients and Caregivers."  5. "Fact Sheet for Patients and Caregivers" was reviewed with patient. A hard copy will be provided to patient from pharmacy prior to the patient receiving treatment. 6. Patients should continue to self-isolate and use infection control measures (e.g., wear mask, isolate, social distance, avoid sharing personal items, clean and disinfect "high touch"  surfaces, and frequent handwashing) according to CDC guidelines.  7. The patient or parent/caregiver has the option to accept or refuse treatment. 8. Patient medication history was reviewed for potential drug interactions:Interaction with home meds: Sildenafil which he can stop 9. Patient's GFR was calculated to be >60, and they were therefore prescribed Normal dose (GFR>60) - nirmatrelvir 150mg  tab (2 tablet) by mouth twice daily AND ritonavir 100mg  tab (1 tablet) by mouth twice daily   After reviewing above information with the patient, the patient agrees to receive Paxlovid.  Follow up instructions:    . Take prescription BID x 5 days as directed . Reach out to pharmacist for counseling on medication if desired . For concerns regarding further COVID symptoms please follow up with your PCP or urgent care . For urgent or life-threatening issues, seek care at your local emergency department  The patient was provided an opportunity to ask questions, and all were answered. The patient agreed with the plan and demonstrated an understanding of the instructions.   Script sent to [name]  The  patient was advised to call their PCP or seek an in-person evaluation if the symptoms worsen or if the condition fails to improve as anticipated.   I provided 20 minutes of non face-to-face telephone visit time during this encounter, and > 50% was spent counseling as documented under my assessment & plan.  Kathrine Haddock, NP 05/11/2020 /11:50 AM

## 2020-05-15 ENCOUNTER — Ambulatory Visit: Payer: BC Managed Care – PPO | Admitting: Physical Therapy

## 2020-05-16 ENCOUNTER — Telehealth: Payer: BC Managed Care – PPO | Admitting: Physician Assistant

## 2020-05-16 ENCOUNTER — Telehealth: Payer: Self-pay | Admitting: *Deleted

## 2020-05-16 ENCOUNTER — Encounter (INDEPENDENT_AMBULATORY_CARE_PROVIDER_SITE_OTHER): Payer: Self-pay

## 2020-05-16 DIAGNOSIS — U071 COVID-19: Secondary | ICD-10-CM

## 2020-05-16 NOTE — Progress Notes (Signed)
  E-Visit for [employer] Virus Screening  Based on what you have shared with me, you need to seek an evaluation for a severe illness that is causing your symptoms which may be coronavirus or some other illness. I recommend that you be seen and evaluated "face to face". If you are considered high risk for Corona virus because of a known exposure, fever, shortness of breath and cough, OR if you have severe symptoms of any kind, seek medical care at an emergency room. Our Emergency Departments are best equipped to handle patients with severe symptoms.  You will be evaluated by the ER provider (or higher level of care provider) who will determine whether you need formal testing.  If you are having a true medical emergency please call 911.   I recommend the following:  . [facility] Emergency Department [place], [place], [place] [address] [phone]  . [facility] Emergency Department [place], [place], [place] [address] [phone]  . [facility] Emergency Department [place], [place], [place] [address] [phone]  . [facility] Emergency Department [address] [place], [place], [place] [address] [phone]  . [facility] Emergency Department [place], [place], [place] [address] [phone]  NOTE: If you entered your credit card information for this eVisit, you will not be charged. You may see a "hold" on your card for the $35 but that hold will drop off and you will not have a charge processed.   Your e-visit answers were reviewed by a board certified advanced clinical practitioner to complete your personal care plan.  Thank you for using e-Visits.  I provided 5 minutes of non face-to-face time during this encounter for chart review and documentation.

## 2020-05-16 NOTE — Telephone Encounter (Signed)
Patient's Covid Questionnaire triggered BPA to give the patient a call. Just completed Paxlovid yesterday.  Chest feels more tight today than earlier this week.  Productive cough with clear phlegm. He coughed excessively during our call, interrupting his speech at times.  No fever 2 days ago, today feels he has fever-waiting on thermometer sleeves to be delivered at this time.  Advised the patient to do a follow-up televisit at this time for lungs, cough and returning fever. Patient stated he would call this afternoon.

## 2020-05-17 ENCOUNTER — Telehealth: Payer: BC Managed Care – PPO | Admitting: Physician Assistant

## 2020-05-17 ENCOUNTER — Encounter: Payer: Self-pay | Admitting: Physician Assistant

## 2020-05-17 DIAGNOSIS — R058 Other specified cough: Secondary | ICD-10-CM | POA: Diagnosis not present

## 2020-05-17 MED ORDER — PREDNISONE 20 MG PO TABS: 20.0000 mg | ORAL_TABLET | Freq: Every day | ORAL | 0 refills | Status: AC

## 2020-05-17 MED ORDER — PROAIR RESPICLICK 108 (90 BASE) MCG/ACT IN AEPB: 1.0000 | INHALATION_SPRAY | Freq: Four times a day (QID) | RESPIRATORY_TRACT | 0 refills | Status: AC | PRN

## 2020-05-17 MED ORDER — PSEUDOEPH-BROMPHEN-DM 30-2-10 MG/5ML PO SYRP: 5.0000 mL | ORAL_SOLUTION | Freq: Four times a day (QID) | ORAL | 0 refills | Status: AC | PRN

## 2020-05-17 NOTE — Progress Notes (Signed)
[name],you are scheduled for a virtual visit with your provider today.    Just as we do with appointments in the office, we must obtain your consent to participate.  Your consent will be active for this visit and any virtual visit you may have with one of our providers in the next 365 days.    If you have a MyChart account, I can also send a copy of this consent to you electronically.  All virtual visits are billed to your insurance company just like a traditional visit in the office.  As this is a virtual visit, video technology does not allow for your provider to perform a traditional examination.  This may limit your provider's ability to fully assess your condition.  If your provider identifies any concerns that need to be evaluated in person or the need to arrange testing such as labs, EKG, etc, we will make arrangements to do so.    Although advances in technology are sophisticated, we cannot ensure that it will always work on either your end or our end.  If the connection with a video visit is poor, we may have to switch to a telephone visit.  With either a video or telephone visit, we are not always able to ensure that we have a secure connection.   I need to obtain your verbal consent now.   Are you willing to proceed with your visit today?   [name] has provided verbal consent on 05/17/2020 for a virtual visit (video or telephone).   Mar Daring, PA-C 05/17/2020  12:11 PM    MyChart Video Visit    Virtual Visit via Video Note   This visit type was conducted due to national recommendations for restrictions regarding the COVID-19 Pandemic (e.g. social distancing) in an effort to limit this patient's exposure and mitigate transmission in our community. This patient is at least at moderate risk for complications without adequate follow up. This format is felt to be most appropriate for this patient at this time. Physical exam was limited by quality of the video and audio  technology used for the visit.   Patient location: Home Provider location: Home office in [place]  I discussed the limitations of evaluation and management by telemedicine and the availability of in person appointments. The patient expressed understanding and agreed to proceed.  Patient: Jacob Lambert   DOB: [birth year]   35 y.o. Adult  MRN: [id number] Visit Date: 05/17/2020  Today's healthcare provider: Mar Daring, PA-C   No chief complaint on file.  Subjective    HPI  B Thayer presents today via [facility] for continued cough and shortness of breath following Covid 19. Their symptoms began on 05/07/20, tested positive for Covid 19 on 05/10/20. They have completed the Paxlovid antiviral treatment on Weds, 05/15/20, and reports continued symptoms as noted above. Cough is somewhat productive, but not consistently. Overall symptoms have improved greatly with Paxlovid treatment.  Patient Active Problem List   Diagnosis Date Noted  . Incomplete paraplegia ([place]) 11/08/2019  . Foot drop, bilateral 11/08/2019  . Spasticity 08/18/2019  . Chronic pain syndrome 08/18/2019  . Nerve pain 08/18/2019  . Clonus 07/21/2019  . Spinal cord tumor   . Pain   . Transaminitis   . Leukocytosis   . Neurogenic bowel   . Postoperative pain   . 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 Acute incomplete quadriplegia (HCC)   . 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 Numbness 05/10/2019  . Weakness  of left lower extremity 05/10/2019  . 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 Visual disturbance 05/10/2019   Past Medical History:  Diagnosis Date  . Anxiety   . Clonus   . Depression   . Headache   . Pneumonia    "walking"  . Quadriplegia and quadriparesis ([place])   . Spasticity   . Spinal cord tumor   . Wheelchair bound       Medications: Outpatient Medications Prior to Visit  Medication Sig  . acetaminophen (TYLENOL) 325 MG tablet Take 2 tablets (650 mg total) by mouth every 4 (four) hours as needed  for mild pain ((score 1 to 3) or temp > 100.5).  . baclofen (LIORESAL) 10 MG tablet Take 1 tablet (10 mg total) by mouth 4 (four) times daily.  . benzonatate (TESSALON) 100 MG capsule Take 1 capsule (100 mg total) by mouth 3 (three) times daily as needed for cough.  . bisacodyl (DULCOLAX) 10 MG suppository Place 1 suppository (10 mg total) rectally at bedtime.  . citalopram (CELEXA) 40 MG tablet Take 1 tablet (40 mg total) by mouth daily.  . dantrolene (DANTRIUM) 100 MG capsule Take 1 capsule (100 mg total) by mouth 3 (three) times daily.  Marland Kitchen dexamethasone (DECADRON) 4 MG tablet  (Patient not taking: Reported on 04/01/2020)  . diazepam (VALIUM) 10 MG tablet Take 1 tablet (10 mg total) by mouth every 6 (six) hours as needed.  . docusate sodium (COLACE) 100 MG capsule Take 1 capsule (100 mg total) by mouth 3 (three) times daily.  . fluticasone (FLONASE) 50 MCG/ACT nasal spray Place 2 sprays into both nostrils daily.  Marland Kitchen gabapentin (NEURONTIN) 600 MG tablet Take 1 tablet (600 mg total) by mouth 3 (three) times daily.  Marland Kitchen lidocaine (LIDODERM) 5 % Place 1 patch onto the skin daily. Remove & Discard patch within 12 hours or as directed by MD (Patient taking differently: Place 1 patch onto the skin daily. Remove & Discard patch within 12 hours or as directed by MD states he is using 4% patch)  . methocarbamol (ROBAXIN) 500 MG tablet  (Patient not taking: Reported on 04/01/2020)  . Multiple Vitamin (MULTIVITAMIN WITH MINERALS) TABS tablet Take 1 tablet by mouth daily.   . polyethylene glycol (MIRALAX / GLYCOLAX) 17 g packet Take 17 g by mouth 3 (three) times daily. (Patient taking differently: Take 17 g by mouth 2 (two) times daily.)  . senna-docusate (SENOKOT-S) 8.6-50 MG tablet Take 2 tablets by mouth at bedtime.  . sildenafil (VIAGRA) 50 MG tablet Take 1 tablet (50 mg total) by mouth as needed for erectile dysfunction.  Marland Kitchen tiZANidine (ZANAFLEX) 2 MG tablet Take 1 tablet (2 mg total) by mouth 2 (two) times  daily as needed for muscle spasms.   No facility-administered medications prior to visit.    Review of Systems  Last CBC Lab Results  Component Value Date   WBC 8.4 06/27/2019   HGB 13.2 06/27/2019   HCT 41.8 06/27/2019   MCV 87.1 06/27/2019   MCH 27.5 06/27/2019   RDW 13.4 06/27/2019   PLT 284 33/82/5053   Last metabolic panel Lab Results  Component Value Date   GLUCOSE 130 (H) 12/06/2019   NA 139 12/06/2019   K 4.8 12/06/2019   CL 99 12/06/2019   CO2 27 12/06/2019   BUN 13 12/06/2019   CREATININE 0.97 12/06/2019   GFRNONAA 101 12/06/2019   GFRAA 117 12/06/2019   CALCIUM 9.8 12/06/2019   PROT 7.6 12/06/2019   ALBUMIN 4.8 12/06/2019  LABGLOB 2.8 12/06/2019   AGRATIO 1.7 12/06/2019   BILITOT 0.4 12/06/2019   ALKPHOS 45 12/06/2019   AST 18 12/06/2019   ALT 22 12/06/2019   ANIONGAP 8 06/27/2019      Objective    There were no vitals taken for this visit. BP Readings from Last 3 Encounters:  04/01/20 125/80  03/06/20 105/71  12/25/19 126/75   Wt Readings from Last 3 Encounters:  04/01/20 158 lb 4.8 oz (71.8 kg)  03/06/20 155 lb 6.4 oz (70.5 kg)  01/26/20 144 lb (65.3 kg)      Physical Exam     Assessment & Plan     1. Post-viral cough syndrome - Suspect post viral cough syndrome secondary to Covid 19 - Will add Bromfed-DM, prednisone 20mg  daily, and albuterol inhaler for symptomatic relief - Discussed expected course and cough could last for 4-6 weeks after severe viral illness - Push fluids - Move around as tolerated to prevent pneumonia - Discussed warning symptoms and when to seek in-person evaluation, voiced understanding  - brompheniramine-pseudoephedrine-DM 30-2-10 MG/5ML syrup; Take 5 mLs by mouth 4 (four) times daily as needed.  Dispense: 120 mL; Refill: 0 - predniSONE (DELTASONE) 20 MG tablet; Take 1 tablet (20 mg total) by mouth daily with breakfast.  Dispense: 5 tablet; Refill: 0 - Albuterol Sulfate (PROAIR RESPICLICK) 237 (90 Base)  MCG/ACT AEPB; Inhale 1-2 puffs into the lungs every 6 (six) hours as needed.  Dispense: 1 each; Refill: 0   No follow-ups on file.     I discussed the assessment and treatment plan with the patient. The patient was provided an opportunity to ask questions and all were answered. The patient agreed with the plan and demonstrated an understanding of the instructions.   The patient was advised to call back or seek an in-person evaluation if the symptoms worsen or if the condition fails to improve as anticipated.  I provided 16 minutes of face-to-face time during this encounter via MyChart Video enabled encounter.   [place] [phone] (phone) [phone] (fax)  [place]

## 2020-05-17 NOTE — Patient Instructions (Signed)
What You Need to Know About Post-Viral Coughs Symptoms Causes Diagnosis Treatment Outlook What is a post-viral cough?  Coughing is an important part of your body's defense against disease. The forceful nature of a cough helps to rid your airways of harmful microbes, extra mucus, and irritants.  A cough is also a common symptom of viral respiratory infections. Usually, this cough goes away shortly after you've recovered from the infection. But in some cases, your cough might stick around long after you've healed.  A cough that lasts longer than three weeks after a viral respiratory infection is called a post-viral or post-infectious cough.  What are the symptoms of a post-viral cough? Coughs are generally categorized as productive (meaning they produce mucus) or dry (meaning they don't). Post-viral coughs can be productive or dry.  Having a long-lasting cough of any kind can also cause other symptoms, including:  sore or irritated throat hoarseness frequent throat clearing  What causes a post-viral cough? Post-viral coughs are usually caused by viral respiratory infection, such as:  flu common cold bronchitis pneumonia croup bronchiolitis pharyngitis Experts aren't sure why viral respiratory infections sometimes lead to a chronic cough, but it may be related to:  inflammatory response to the infection that damages the lining of your airways, causing you to cough increased sensitivity of the coughing reflex following an infection How is a post-viral cough diagnosed? If you're coughing but have had a viral illness in the last few weeks, you likely don't need to see a doctor. However, asthma, gastroesophageal reflux disease, and other conditions can cause a similar cough.  So, if you're concerned about your cough or you're not sure if it's related to a recent illness, consider seeing a doctor.  The doctor will start by asking whether you've been sick in the last month or two.  Tell them about any illnesses you've had, even if they weren't respiratory. Next, they may do a physical examination and use a stethoscope to listen to your chest as you breathe in and out.  Depending on what they hear, they might also order a chest X-ray to get a better view of your chest and lungs.  If they suspect an underlying infection, they might also take a sputum sample to check for signs of infectious organisms.  You'll likely be diagnosed with a post-viral cough if:  you've recently had a respiratory infection your cough lasts between three and eight weeks a chest X-ray doesn't show anything unusual [employer] Get our weekly COPD newsletter To help meet the challenges of living with COPD, we'll send support and advice for both patients and caregivers.  Enter your email Also sign up for our popular [place] newsletter Your privacy is important to [place]  How are post-viral coughs treated? Post-viral coughs often clear up on their own over time, usually within two months. But in the meantime, prescription or over-the-counter (OTC) medications can offer some relief.  These include:  prescription inhaled ipratropium (Atrovent), which opens up your airways and prevents mucus accumulation prescription oral or inhaled corticosteroids, which can reduce inflammation OTC cough-suppressants containing dextromethorphan (Mucinex DX, Robitussin) OTC antihistamines, such as diphenhydramine (Benadryl) OTC decongestants, such as pseudoephedrine (Sudafed) While you recover, you should also try:  drinking plenty of warm liquids, such as tea or broth, to soothe throat irritation from coughing using a humidifier or taking a steamy shower to add moisture to the air around you avoiding or protecting yourself against throat irritants, such as cigarette smoke or polluted air  If you're still coughing after two months, make an appointment with a doctor. Your cough is likely due to  something other than a recent viral infection.  What's the outlook? While post-viral coughs are frustrating, and especially so when they interfere with sleep, they usually go away on their own within two months.  As you recover, there are several things you can do to reduce coughing and throat inflammation.  If your cough isn't getting any better after two months, see a doctor to determine what's causing it.

## 2020-05-23 ENCOUNTER — Ambulatory Visit: Payer: BC Managed Care – PPO | Attending: Physical Medicine and Rehabilitation | Admitting: Physical Therapy

## 2020-05-23 DIAGNOSIS — R2681 Unsteadiness on feet: Secondary | ICD-10-CM | POA: Insufficient documentation

## 2020-05-23 DIAGNOSIS — R278 Other lack of coordination: Secondary | ICD-10-CM | POA: Insufficient documentation

## 2020-05-23 DIAGNOSIS — M6281 Muscle weakness (generalized): Secondary | ICD-10-CM | POA: Insufficient documentation

## 2020-05-23 DIAGNOSIS — R208 Other disturbances of skin sensation: Secondary | ICD-10-CM | POA: Insufficient documentation

## 2020-05-23 DIAGNOSIS — R262 Difficulty in walking, not elsewhere classified: Secondary | ICD-10-CM | POA: Insufficient documentation

## 2020-05-23 DIAGNOSIS — R2689 Other abnormalities of gait and mobility: Secondary | ICD-10-CM | POA: Insufficient documentation

## 2020-05-24 DIAGNOSIS — N342 Other urethritis: Secondary | ICD-10-CM | POA: Diagnosis not present

## 2020-05-29 DIAGNOSIS — R102 Pelvic and perineal pain: Secondary | ICD-10-CM | POA: Diagnosis not present

## 2020-05-29 DIAGNOSIS — R300 Dysuria: Secondary | ICD-10-CM | POA: Diagnosis not present

## 2020-05-29 DIAGNOSIS — N411 Chronic prostatitis: Secondary | ICD-10-CM | POA: Diagnosis not present

## 2020-05-29 DIAGNOSIS — N5312 Painful ejaculation: Secondary | ICD-10-CM | POA: Diagnosis not present

## 2020-05-30 ENCOUNTER — Ambulatory Visit: Payer: BC Managed Care – PPO | Admitting: Physical Therapy

## 2020-05-30 ENCOUNTER — Other Ambulatory Visit: Payer: Self-pay

## 2020-05-30 ENCOUNTER — Encounter: Payer: Self-pay | Admitting: Physical Therapy

## 2020-05-30 DIAGNOSIS — R2681 Unsteadiness on feet: Secondary | ICD-10-CM

## 2020-05-30 DIAGNOSIS — M6281 Muscle weakness (generalized): Secondary | ICD-10-CM | POA: Diagnosis not present

## 2020-05-30 DIAGNOSIS — R262 Difficulty in walking, not elsewhere classified: Secondary | ICD-10-CM

## 2020-05-30 DIAGNOSIS — R278 Other lack of coordination: Secondary | ICD-10-CM

## 2020-05-30 DIAGNOSIS — R208 Other disturbances of skin sensation: Secondary | ICD-10-CM

## 2020-05-30 DIAGNOSIS — R2689 Other abnormalities of gait and mobility: Secondary | ICD-10-CM

## 2020-05-30 NOTE — Therapy (Signed)
[facility] [address] [place], [place], [address] Phone: [phone]   Fax:  [phone]  Physical Therapy Treatment  Patient Details  Name: Jacob Lambert MRN: [id number] Date of Birth: June 18, 1985 Referring Provider (PT): Dr. Courtney Heys   Encounter Date: 05/30/2020   PT End of Session - 05/30/20 1601    Visit Number 17    Number of Visits 70    Date for PT Re-Evaluation 07/29/20    Authorization Type BCBS - VL: 30 PT/OT combined    Authorization - Visit Number 25    Authorization - Number of Visits 30    PT Start Time 2330    PT Stop Time 1450    PT Time Calculation (min) 46 min    Equipment Utilized During Treatment Other (comment)   bil AFO's   Activity Tolerance Patient tolerated treatment well    Behavior During Therapy WFL for tasks assessed/performed           Past Medical History:  Diagnosis Date  . Anxiety   . Clonus   . Depression   . Headache   . Pneumonia    "walking"  . Quadriplegia and quadriparesis ([facility])   . Spasticity   . Spinal cord tumor   . Wheelchair bound     Past Surgical History:  Procedure Laterality Date  . LAMINECTOMY N/A 06/01/2019   Procedure: Cervical six-seven Laminoplasty, biopsy and resection of intramedullary spinal cord tumor;  Surgeon: [name], MD;  Location: [facility];  Service: Neurosurgery;  Laterality: N/A;  . NO PAST SURGERIES      There were no vitals filed for this visit.   Subjective Assessment - 05/30/20 1406    Subjective Pt had COVID and was treated with anti-viral, steroids and inhaler.  Pt still using inhaler and has lingering cough.  Fatigue has been comparable to when they had radiation.  Postponed relocation trip.  Pt does feel they will move to [facility] but plans to go to [place] in [place].  Pt also got a UTI while out of town - significant urgency and frequency.  Got an antibiotic for UTI.  Looking to move in [place].  Having more  issues with blurry vision but overall vision is better; fitting for sclera lens is next week.    Limitations Standing;Walking;House hold activities    How long can you sit comfortably? no issues    How long can you stand comfortably? <5 min    Patient Stated Goals Progress walking    Currently in Pain? No/denies              [facility] PT Assessment - 05/30/20 1438      Assessment   Medical Diagnosis Weakness (cervical spinal cord tumor resection/post radiation)    Referring Provider (PT) Dr. Courtney Heys    Onset Date/Surgical Date 06/01/19    Hand Dominance Left    Prior Therapy [facility] inpatient [facility]hab, home health      Precautions   Precautions Fall    Required Braces or Orthoses Other Brace/Splint      Balance Screen   Has the patient fallen in the past 6 months Yes    How many times? 1   in [facility]; due to urine urgency     Prior Function   Level of Independence Independent    Vocation Full time employment    Vocation Requirements [place] travel the world and travel a lot,  arts      Ambulation/Gait   Ambulation/Gait Yes    Ambulation/Gait Assistance 5: Supervision    Ambulation/Gait Assistance Details outside over uneven pavement with cane; during second lap around building pt became more SOB, more difficulty with completing a full sentence due to breathing, increased LE fatigue and required one standing rest break.  Pt's gait velocity also slowed during second lap.  No LOB or LE giving out though.    Ambulation Distance (Feet) 2000 Feet    Assistive device Straight cane    Gait Pattern Step-through pattern;Lateral hip instability;Narrow base of support    Ambulation Surface Unlevel;Outdoor;Paved      Standardized Balance Assessment   Standardized Balance Assessment 10 meter walk test    10 Meter Walk 8.12 seconds with cane and GRAFO's - 4.03 ft/sec                                 PT Education - 05/30/20 1601     Education Details progress towards goals, areas to continue to focus on for the next two months    Person(s) Educated Patient    Methods Explanation    Comprehension Verbalized understanding            PT Short Term Goals - 05/30/20 1418      PT SHORT TERM GOAL #1   Title Pt will improve functional aerobic endurance to perform elliptical x 5 minutes with Bioness in cycle mode without leaning heavily on UE for support    Baseline 2 minutes - regressed due to COVID and UTI    Time 4    Period Weeks    Status Not Met    Target Date 05/22/20             PT Long Term Goals - 05/30/20 1442      PT LONG TERM GOAL #1   Title Patient will demo >54/56 on berg balance scale wearing bilat GRAFO's to improve functional balance and reduce fall risk    Baseline 04/11/20: 51/56 scored today    Time 8    Period Weeks    Status On-going      PT LONG TERM GOAL #2   Title Patient will be able to ambulate >1000 feet outside with cane and bilat GRAFO MOD I to improve short distance community ambulation.    Baseline 2000 with cane and GRAFO, supervision    Time 8    Period Weeks    Status Partially Met      PT LONG TERM GOAL #3   Title Pt will improve gait velocity with cane and bilat GRAFO's to >/= 4.0 ft/sec    Baseline 4.03 ft/sec with cane and GRAFO    Time 8    Period Weeks    Status Achieved      PT LONG TERM GOAL #4   Title Pt will demonstrate ability to negotiate curb, ramp and 12 stairs with cane, bilat GRAFO's safely Mod I    Baseline 04/11/20: 12 steps with rail/cane, with min guard assist needed for saftey    Time 8    Period Weeks    Status On-going           New goals for recertification:   PT Short Term Goals - 05/30/20 1612      PT SHORT TERM GOAL #1   Title Pt will improve functional aerobic endurance to perform elliptical x 5  minutes with Bioness in cycle mode without leaning heavily on UE for support    Baseline 2 minutes - regressed due to COVID and UTI     Time 4    Period Weeks    Status Revised    Target Date 06/29/20              PT Long Term Goals - 05/30/20 1613      PT LONG TERM GOAL #1   Title Patient will demo >54/56 on berg balance scale wearing bilat GRAFO's to improve functional balance and reduce fall risk    Time 8    Period Weeks    Status Revised    Target Date 07/29/20      PT LONG TERM GOAL #2   Title Patient will be able to ambulate >2000 feet outside with cane and bilat GRAFO MOD I to improve short distance community ambulation.    Baseline 2000 with cane and GRAFO, close supervision    Time 8    Period Weeks    Status Revised    Target Date 07/29/20      PT LONG TERM GOAL #4   Title Pt will demonstrate ability to negotiate curb, ramp and 12 stairs with cane, bilat GRAFO's safely Mod I    Time 8    Period Weeks    Status Revised    Target Date 07/29/20              Plan - 05/30/20 1603    Clinical Impression Statement Due to patient experiencing two significant illnesses recently and not being able to attend therapy for 5 weeks, therapist performed re-assessment of STG and LTG to determine if pt had experienced a decline in function.  Only decline noted is in functional activity tolerance and aerobic endurance; pt has met gait velocity goal and has partially met outdoor gait goal - distance met but continues to require supervision for safety due to imbalance when using cane.  Will assess BERG and remaining gait goal at next session.  Pt will benefit from continued skilled PT services to address ongoing impairments to maximize functional mobility independence and decrease falls risk before patient relocates to live in [place] independently.    Personal Factors and Comorbidities Age;Past/Current Experience;Comorbidity 2;Time since onset of injury/illness/exacerbation;Transportation;Comorbidity 1    Examination-Activity Limitations Bathing;Lift;Squat;Stairs;Stand;Transfers    Examination-Participation  Restrictions Cleaning;Community Activity;Driving;Laundry;Occupation;Shop;Yard Work    Rehab Potential Good    PT Frequency 2x / week    PT Duration 8 weeks    PT Treatment/Interventions ADLs/Self Care Home Management;Gait training;Stair training;Functional mobility training;Therapeutic activities;Therapeutic exercise;Balance training;Neuromuscular re-education;Patient/family education;Passive range of motion;Energy conservation;Electrical Stimulation;DME Instruction;Orthotic Fit/Training;Aquatic Therapy    PT Next Visit Plan Re-start aquatic PT.  Land based PT: re-assess BERG and other gait goal. continue to work on functional E-stim as appropriate. Work on stepping strategies when outside LOS with balance execises/strategies, work on tandem and SLS balances. Elliptical with Bioness: Continue to progress as tolerated.    PT Home Exercise Plan Access Code: [email or web address]; [email or web address]. plus heel raises, and HS curls verbally added. Pt stated able to recall these without new handouts.    Consulted and Agree with Plan of Care Patient           Patient will benefit from skilled therapeutic intervention in order to improve the following deficits and impairments:  Abnormal gait,Decreased activity tolerance,Decreased balance,Decreased mobility,Decreased endurance,Decreased coordination,Decreased strength,Difficulty walking,Impaired sensation,Impaired tone,Postural dysfunction,Pain  Visit Diagnosis: Muscle weakness (generalized)  Other abnormalities of gait and  mobility  Unsteadiness on feet  Difficulty in walking, not elsewhere classified  Other disturbances of skin sensation  Other lack of coordination     Problem List Patient Active Problem List   Diagnosis Date Noted  . Incomplete paraplegia ([place]) 11/08/2019  . Foot drop, bilateral 11/08/2019  . Spasticity 08/18/2019  . Chronic pain syndrome 08/18/2019  . Nerve pain 08/18/2019  . Clonus 07/21/2019  . Spinal cord tumor   . Pain   .  Transaminitis   . Leukocytosis   . Neurogenic bowel   . Postoperative pain   . 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 Acute incomplete quadriplegia (HCC)   . 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 Numbness 05/10/2019  . Weakness of left lower extremity 05/10/2019  . 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 Visual disturbance 05/10/2019    Rico Junker, PT, DPT 05/30/20    4:14 PM    [facility] [address] [place], [place], [address] Phone: [phone]   Fax:  [phone]  Name: Taje Littler MRN: [id number] Date of Birth: 08/05/1985

## 2020-06-04 ENCOUNTER — Encounter: Payer: Self-pay | Admitting: Internal Medicine

## 2020-06-05 ENCOUNTER — Encounter: Payer: Self-pay | Admitting: Physical Medicine and Rehabilitation

## 2020-06-05 ENCOUNTER — Encounter
Payer: BC Managed Care – PPO | Attending: Physical Medicine and Rehabilitation | Admitting: Physical Medicine and Rehabilitation

## 2020-06-05 ENCOUNTER — Other Ambulatory Visit: Payer: Self-pay

## 2020-06-05 VITALS — BP 111/76 | HR 89 | Temp 99.0°F | Ht 70.0 in | Wt 157.6 lb

## 2020-06-05 DIAGNOSIS — G8222 Paraplegia, incomplete: Secondary | ICD-10-CM | POA: Insufficient documentation

## 2020-06-05 DIAGNOSIS — D497 Neoplasm of unspecified behavior of endocrine glands and other parts of nervous system: Secondary | ICD-10-CM | POA: Diagnosis not present

## 2020-06-05 DIAGNOSIS — G894 Chronic pain syndrome: Secondary | ICD-10-CM | POA: Diagnosis not present

## 2020-06-05 DIAGNOSIS — R252 Cramp and spasm: Secondary | ICD-10-CM | POA: Diagnosis present

## 2020-06-05 DIAGNOSIS — M21371 Foot drop, right foot: Secondary | ICD-10-CM | POA: Insufficient documentation

## 2020-06-05 DIAGNOSIS — M21372 Foot drop, left foot: Secondary | ICD-10-CM | POA: Diagnosis present

## 2020-06-05 NOTE — Patient Instructions (Signed)
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 Here for f/u on SCI.   1. Went over bladder colonization- and not to check U/A -urinalysis and Culture- unless has significant symptoms of a UTI- we also don't treat unless has obvious fever, leukocytosis or incontinence. Pain with peeing and urgency are also signs, but keep a close eye on it- also worsening spasticity ABRUPTLY can be sign of UTI.    2. Will need to switch to [facility] for medical care.   3. If gets Botox of LE's, suggest waiting til gets to [place], [place].   4. Discussed dantrolene and changing dose- will keep it the same- for now- max dose is 400 mg   5. Con't Baclofen 10 mg 4x/day    6. Taking Valium rectally- per Urology. Orally only taking with flying, etc.   7. Doesn't need refills on dantrolene/Baclofen today.   8. F/U prn - can refill meds until seen in [place], [place].

## 2020-06-05 NOTE — Progress Notes (Signed)
Subjective:    Patient ID: Jacob Lambert, adult    DOB: [birth year], 35 y.o.   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 Here for f/u on SCI.   Has decided to Move to [facility].  Has a disability gym that's open to public. More welcoming-  But expensive- has a trial program for SCI for medicaid and on top of medicaid- unlimited PT therapies.   At 1 year-  Is "so-so"..   Has a UTI- earlier this month.  Was voiding every hour and had dysuria.    Was sent to Urology/pelvic floor exercises and pain after ejaculation.  Viagra works 50/50- and decreased libido.  Has 2nd session next week of pelvic floor PT.   THC "meds" helped more when was in [place], [place] With sexual Sx's and spasticity.   Had braces/AFOs on the whole time was gone.   COVID- ~ 1 month ago and made spasticity much worse- was also on prednisone and antiviral medicine- still using inhaler and cough still not gone away- is improved.    Is 1 year out and thinks is ahead of where was going to be.  With cane and AFOs walked just over 1/4 of mile.   Knees getting more tight and more painful.  Feet are pure ice. Per pt.   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  Plans to stay with parents in December to have surgery on L eye- will "be blind" for 1 week.    Mornings, spasticity has been pretty bad- since COVID.  Once usually improved by noon.   Harder time to fall asleep.  Will nap, then up til 4-5 am will crash again.      Pain Inventory Average Pain 5 Pain Right Now 5 My pain is sharp, burning, dull, stabbing, tingling and aching  In the last 24 hours, has pain interfered with the following? General activity 6 Relation with others 4 Enjoyment of life 4 What TIME of day is your pain at its worst? morning  Sleep (in general) Fair  Pain is worse  with: walking, bending, inactivity and some activites Pain improves with: heat/ice and medication Relief from Meds: 7  No family history on file. Social History   Socioeconomic History  . Marital status: Single    Spouse name: Not on file  . Number of children: Not on file  . Years of education: Not on file  . Highest education level: Not on file  Occupational History  . Not on file  Tobacco Use  . Smoking status: Former Research scientist (life sciences)  . Smokeless tobacco: Never Used  . Tobacco comment: none in years   Vaping Use  . Vaping Use: Never used  Substance and Sexual Activity  . Alcohol use: Yes    Comment: 2 bottles of wine per week, Maybe 4 - 8 shots aweek  . Drug use: Not Currently    Comment: CDB  . Sexual activity: Not Currently  Other Topics Concern  . Not on file  Social History Narrative   Lives with parents   Caffeine use: minimum- 3 cups per day (on average 6-10 cups per day)   Left handed    Social Determinants of Health   Financial Resource Strain: Not on file  Food Insecurity: Not on file  Transportation Needs: Not on file  Physical Activity: Not  on file  Stress: Not on file  Social Connections: Not on file   Past Surgical History:  Procedure Laterality Date  . LAMINECTOMY N/A 06/01/2019   Procedure: Cervical six-seven Laminoplasty, biopsy and resection of intramedullary spinal cord tumor;  Surgeon: [name], MD;  Location: [facility];  Service: Neurosurgery;  Laterality: N/A;  . NO PAST SURGERIES     Past Surgical History:  Procedure Laterality Date  . LAMINECTOMY N/A 06/01/2019   Procedure: Cervical six-seven Laminoplasty, biopsy and resection of intramedullary spinal cord tumor;  Surgeon: [name], MD;  Location: [facility];  Service: Neurosurgery;  Laterality: N/A;  . NO PAST SURGERIES     Past Medical History:  Diagnosis Date  . Anxiety   . Clonus   . Depression   . Headache   . Pneumonia    "walking"  . Quadriplegia and quadriparesis ([place])    . Spasticity   . Spinal cord tumor   . Wheelchair bound    BP 111/76   Pulse 89   Temp 99 F (37.2 C)   Ht 5\' 10"  (1.778 m)   Wt 157 lb 9.6 oz (71.5 kg)   SpO2 98%   BMI 22.61 kg/m   Opioid Risk Score:   Fall Risk Score:  `1  Depression screen PHQ 2/9  Depression screen [facility] 2/9 01/26/2020 12/06/2019 10/02/2019 08/18/2019  Decreased Interest 0 1 1 2   Down, Depressed, Hopeless 2 1 1 2   PHQ - 2 Score 2 2 2 4   Altered sleeping 2 - - -  Tired, decreased energy 0 - - -  Change in appetite 0 - - -  Feeling bad or failure about yourself  0 - - -  Trouble concentrating 0 - - -  Moving slowly or fidgety/restless 0 - - -  Suicidal thoughts 0 - - -  PHQ-9 Score 4 - - -    Review of Systems  Musculoskeletal: Positive for back pain.       Pelvic pain Leg pain Feet pain Wrist pain  Neurological: Positive for dizziness, tremors, weakness, numbness and headaches.       Tingling  All other systems reviewed and are negative.      Objective:   Physical Exam  Awake, alert, appropriate, using quad cane and B/L AFOs, NAD Feet are a little cool to touch, but not severe.  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 Here for f/u on SCI.   1. Went over bladder colonization- and not to check U/A -urinalysis and Culture- unless has significant symptoms of a UTI- we also don't treat unless has obvious fever, leukocytosis or incontinence. Pain with peeing and urgency are also signs, but keep a close eye on it- also worsening spasticity ABRUPTLY can be sign of UTI.    2. Will need to switch to [facility] for medical care.   3. If gets Botox of LE's, suggest waiting til gets to [place],  [place].   4. Discussed dantrolene and changing dose- will keep it the same- for now- max dose is 400 mg   5. Con't Baclofen 10 mg 4x/day  6. Taking Valium rectally- per Urology. Orally only taking with flying, etc.   7. Doesn't need refills on dantrolene/Baclofen today.   8. F/U prn - can refill meds until seen in [place], [place].    I spent a total of 30 minutes on visit- discussing [place] and spasticity overall.

## 2020-06-06 ENCOUNTER — Ambulatory Visit: Payer: BC Managed Care – PPO | Attending: Physical Medicine and Rehabilitation | Admitting: Physical Therapy

## 2020-06-06 ENCOUNTER — Encounter: Payer: Self-pay | Admitting: Physical Therapy

## 2020-06-06 DIAGNOSIS — R2689 Other abnormalities of gait and mobility: Secondary | ICD-10-CM

## 2020-06-06 DIAGNOSIS — R2681 Unsteadiness on feet: Secondary | ICD-10-CM | POA: Insufficient documentation

## 2020-06-06 DIAGNOSIS — R208 Other disturbances of skin sensation: Secondary | ICD-10-CM | POA: Insufficient documentation

## 2020-06-06 DIAGNOSIS — R293 Abnormal posture: Secondary | ICD-10-CM | POA: Diagnosis present

## 2020-06-06 DIAGNOSIS — M6281 Muscle weakness (generalized): Secondary | ICD-10-CM | POA: Insufficient documentation

## 2020-06-06 DIAGNOSIS — R262 Difficulty in walking, not elsewhere classified: Secondary | ICD-10-CM | POA: Insufficient documentation

## 2020-06-06 NOTE — Therapy (Signed)
[facility] [address] [place], [place], [address] Phone: [phone]   Fax:  [phone]  Physical Therapy Treatment  Patient Details  Name: Jacob Lambert MRN: [id number] Date of Birth: [birth year] Referring Provider (PT): Dr. Courtney Heys   Encounter Date: 06/06/2020   PT End of Session - 06/06/20 1410    Visit Number 6    Number of Visits 70    Date for PT Re-Evaluation 07/29/20    Authorization Type BCBS - VL: 30 PT/OT combined    Authorization - Visit Number 26    Authorization - Number of Visits 30    PT Start Time 9381    PT Stop Time 1445    PT Time Calculation (min) 41 min    Equipment Utilized During Treatment Other (comment)   bil AFO's   Activity Tolerance Patient tolerated treatment well    Behavior During Therapy WFL for tasks assessed/performed           Past Medical History:  Diagnosis Date  . Anxiety   . Clonus   . Depression   . Headache   . Pneumonia    "walking"  . Quadriplegia and quadriparesis ([place])   . Spasticity   . Spinal cord tumor   . Wheelchair bound     Past Surgical History:  Procedure Laterality Date  . LAMINECTOMY N/A 06/01/2019   Procedure: Cervical six-seven Laminoplasty, biopsy and resection of intramedullary spinal cord tumor;  Surgeon: [name], MD;  Location: [facility];  Service: Neurosurgery;  Laterality: N/A;  . NO PAST SURGERIES      There were no vitals filed for this visit.   Subjective Assessment - 06/06/20 1411    Subjective Still working out the details of moving to [employer], housing, therapy, surgery, Botox, etc.  Walking with cane only today; couldn't load rollator in and out of car today.  Still coughing and still feels low energy.    Limitations Standing;Walking;House hold activities    How long can you sit comfortably? no issues    How long can you stand comfortably? <5 min    Patient Stated Goals Progress walking     Currently in Pain? No/denies              [facility] PT Assessment - 06/06/20 1417      Ambulation/Gait   Ambulation/Gait Yes    Ambulation/Gait Assistance 5: Supervision    Assistive device Straight cane    Ambulation Surface Level;Indoor    Stairs Yes    Stairs Assistance 5: Supervision    Stair Management Technique No rails;Step to pattern;With cane    Number of Stairs 12    Height of Stairs 6    Ramp 6: Modified independent (Device)    Ramp Details (indicate cue type and reason) with cane and bilat GRAFO    Curb 6: Modified independent (Device/increase time)    Curb Details (indicate cue type and reason) with cane and bilat GRAFO      Berg Balance Test   Sit to Stand Able to stand without using hands and stabilize independently    Standing Unsupported Able to stand safely 2 minutes    Sitting with Back Unsupported but Feet Supported on Floor or Stool Able to sit safely and securely 2 minutes    Stand to Sit Sits safely with minimal use of hands    Transfers Able to transfer safely, minor use of hands    Standing Unsupported  with Eyes Closed Able to stand 10 seconds safely    Standing Unsupported with Feet Together Able to place feet together independently and stand 1 minute safely    From Standing, Reach Forward with Outstretched Arm Can reach forward >12 cm safely (5")    From Standing Position, Pick up Object from [place] to pick up shoe, needs supervision    From Standing Position, Turn to Look Behind Over each Shoulder Looks behind from both sides and weight shifts well    Turn 360 Degrees Able to turn 360 degrees safely in 4 seconds or less    Standing Unsupported, Alternately Place Feet on Step/Stool Able to stand independently and safely and complete 8 steps in 20 seconds    Standing Unsupported, One Foot in [place] to plae foot ahead of the other independently and hold 30 seconds    Standing on One Leg Able to lift leg independently and hold 5-10 seconds    Total  Score 52    Berg comment: 52/56 with bilat GRAFO                                 PT Education - 06/06/20 1453    Education Details completed assessment of LTG today; progress towards final two goals, areas to continue to address by end of session; plan to decrease use of Bioness or only use lower unit to strengthen dorsiflexion during therapy    Person(s) Educated Patient    Methods Explanation    Comprehension Verbalized understanding            PT Short Term Goals - 05/30/20 1612      PT SHORT TERM GOAL #1   Title Pt will improve functional aerobic endurance to perform elliptical x 5 minutes with Bioness in cycle mode without leaning heavily on UE for support    Baseline 2 minutes - regressed due to COVID and UTI    Time 4    Period Weeks    Status Revised    Target Date 06/29/20             PT Long Term Goals - 06/06/20 1455      PT LONG TERM GOAL #1   Title Patient will demo >54/56 on berg balance scale wearing bilat GRAFO's to improve functional balance and reduce fall risk  (ALL LTG DUE BY 07/29/20)    Baseline 52/56    Time 8    Period Weeks    Status Revised      PT LONG TERM GOAL #2   Title Patient will be able to ambulate >2000 feet outside with cane and bilat GRAFO MOD I to improve short distance community ambulation.    Baseline 2000 with cane and GRAFO, close supervision    Time 8    Period Weeks    Status Revised      PT LONG TERM GOAL #4   Title Pt will demonstrate ability to negotiate curb, ramp and 12 stairs with cane, bilat GRAFO's safely Mod I    Baseline Performs with supervision with cane    Time 8    Period Weeks    Status Revised                 Plan - 06/06/20 1456    Clinical Impression Statement Continued to assess progress towards LTG.  Pt is making progress with standing balance as indicated by improvement in  BERG score and required less assistance to perform safe negotiation of curb, ramp and stairs with  cane and bilat AFO.  Pt continued to require supervision for higher level gait due to intermittent LOB on stairs and catching their foot on the edge of the step.  AFO's also limit patient's ability to translate and shift COG over BOS during functional reaching, gait on inclines and descending stairs.  PT to continue to address LE weakness, impaired endurance and balance impairments to maximize functional mobility independence and decrease falls risk prior to patient transitioning to [place].    Personal Factors and Comorbidities Age;Past/Current Experience;Comorbidity 2;Time since onset of injury/illness/exacerbation;Transportation;Comorbidity 1    Examination-Activity Limitations Bathing;Lift;Squat;Stairs;Stand;Transfers    Examination-Participation Restrictions Cleaning;Community Activity;Driving;Laundry;Occupation;Shop;Yard Work    Rehab Potential Good    PT Frequency 2x / week    PT Duration 8 weeks    PT Treatment/Interventions ADLs/Self Care Home Management;Gait training;Stair training;Functional mobility training;Therapeutic activities;Therapeutic exercise;Balance training;Neuromuscular re-education;Patient/family education;Passive range of motion;Energy conservation;Electrical Stimulation;DME Instruction;Orthotic Fit/Training;Aquatic Therapy    PT Next Visit Plan Re-start aquatic PT.  Land based PT: May only use lower Bioness cuff to work on [employer] strength instead of the whole set up; work on gait on inclines, stairs with cane. Glute med strengthening; balance training for tandem and SLS balances.   Endurance with Elliptical    PT Home Exercise Plan Access Code: [email or web address]; [email or web address]. plus heel raises, and HS curls verbally added. Pt stated able to recall these without new handouts.    Consulted and Agree with Plan of Care Patient           Patient will benefit from skilled therapeutic intervention in order to improve the following deficits and impairments:  Abnormal gait,Decreased activity  tolerance,Decreased balance,Decreased mobility,Decreased endurance,Decreased coordination,Decreased strength,Difficulty walking,Impaired sensation,Impaired tone,Postural dysfunction,Pain  Visit Diagnosis: Muscle weakness (generalized)  Other abnormalities of gait and mobility  Unsteadiness on feet  Difficulty in walking, not elsewhere classified  Other disturbances of skin sensation     Problem List Patient Active Problem List   Diagnosis Date Noted  . Incomplete paraplegia ([place]) 11/08/2019  . Foot drop, bilateral 11/08/2019  . Spasticity 08/18/2019  . Chronic pain syndrome 08/18/2019  . Nerve pain 08/18/2019  . Clonus 07/21/2019  . Spinal cord tumor   . Pain   . Transaminitis   . Leukocytosis   . Neurogenic bowel   . Postoperative pain   . 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 Acute incomplete quadriplegia ([facility])   . 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 Numbness 05/10/2019  . Weakness of left lower extremity 05/10/2019  . 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 Visual disturbance 05/10/2019    Rico Junker, PT, DPT 06/06/20    8:25 PM    [facility] [address] [place], [place], [address] Phone: [phone]   Fax:  [phone]  Name: Randie Tallarico MRN: [id number] Date of Birth: [birth year]

## 2020-06-10 DIAGNOSIS — H18621 Keratoconus, unstable, right eye: Secondary | ICD-10-CM | POA: Diagnosis not present

## 2020-06-11 DIAGNOSIS — R262 Difficulty in walking, not elsewhere classified: Secondary | ICD-10-CM | POA: Diagnosis not present

## 2020-06-11 DIAGNOSIS — N411 Chronic prostatitis: Secondary | ICD-10-CM | POA: Diagnosis not present

## 2020-06-11 DIAGNOSIS — R102 Pelvic and perineal pain: Secondary | ICD-10-CM | POA: Diagnosis not present

## 2020-06-11 DIAGNOSIS — N5312 Painful ejaculation: Secondary | ICD-10-CM | POA: Diagnosis not present

## 2020-06-12 ENCOUNTER — Ambulatory Visit: Payer: BC Managed Care – PPO

## 2020-06-19 ENCOUNTER — Encounter: Payer: Self-pay | Admitting: Physical Therapy

## 2020-06-19 ENCOUNTER — Ambulatory Visit: Payer: BC Managed Care – PPO | Admitting: Physical Therapy

## 2020-06-19 ENCOUNTER — Other Ambulatory Visit: Payer: Self-pay

## 2020-06-19 DIAGNOSIS — R293 Abnormal posture: Secondary | ICD-10-CM | POA: Diagnosis not present

## 2020-06-19 DIAGNOSIS — M6281 Muscle weakness (generalized): Secondary | ICD-10-CM | POA: Diagnosis not present

## 2020-06-19 DIAGNOSIS — R208 Other disturbances of skin sensation: Secondary | ICD-10-CM | POA: Diagnosis not present

## 2020-06-19 DIAGNOSIS — R2681 Unsteadiness on feet: Secondary | ICD-10-CM | POA: Diagnosis not present

## 2020-06-19 DIAGNOSIS — R262 Difficulty in walking, not elsewhere classified: Secondary | ICD-10-CM

## 2020-06-19 DIAGNOSIS — R2689 Other abnormalities of gait and mobility: Secondary | ICD-10-CM | POA: Diagnosis not present

## 2020-06-20 NOTE — Therapy (Signed)
[facility] [address] [place], [place], [address] Phone: [phone]   Fax:  [phone]  Physical Therapy Treatment  Patient Details  Name: Jacob Lambert MRN: [id number] Date of Birth: 1985-10-21 Referring Provider (PT): Dr. Courtney Heys   Encounter Date: 06/19/2020    06/19/20 1412  PT Visits / Re-Eval  Visit Number 61  Number of Visits 70  Date for PT Re-Evaluation 07/29/20  Authorization  Authorization Type BCBS - VL: 30 PT/OT combined  Authorization - Visit Number 27  Authorization - Number of Visits 30  PT Time Calculation  PT Start Time 5462  PT Stop Time 1445  PT Time Calculation (min) 40 min  PT - End of Session  Equipment Utilized During Treatment Other (comment) (bil AFO's)  Activity Tolerance Patient tolerated treatment well  Behavior During Therapy WFL for tasks assessed/performed    Past Medical History:  Diagnosis Date   Anxiety    Clonus    Depression    Headache    Pneumonia    "walking"   Quadriplegia and quadriparesis ([place])    Spasticity    Spinal cord tumor    Wheelchair bound     Past Surgical History:  Procedure Laterality Date   LAMINECTOMY N/A 06/01/2019   Procedure: Cervical six-seven Laminoplasty, biopsy and resection of intramedullary spinal cord tumor;  Surgeon: [name], MD;  Location: [facility];  Service: Neurosurgery;  Laterality: N/A;   NO PAST SURGERIES      There were no vitals filed for this visit.     06/19/20 1407  Symptoms/Limitations  Subjective Was out of town in [place] and had issues with [employer] as they damaged his motorized wheelchair, significant damage. They gave they a manual while in [place] which was not condusive to they to use. After many calls they are getting they a new power chair. Also had pelvic scan (at [facility]). Showed an inflamed Femoral Nerve. Now they are planning to do more testing along the  nerve and the lower back area to rule out compression, new tumor or other issues. Has not been scheduled as yet, will be scheduled when his next set of MRI's are due in July most likely.  Limitations Standing;Walking;House hold activities  How long can you sit comfortably? no issues  How long can you stand comfortably? <5 min  How long can you walk comfortably? <0.25 mile  Patient Stated Goals Progress walking       06/19/20 1418  Transfers  Transfers Sit to Stand;Stand to Sit  Sit to Stand 6: Modified independent (Device/Increase time)  Stand to Sit 6: Modified independent (Device/Increase time)  Ambulation/Gait  Ambulation/Gait Yes  Ambulation/Gait Assistance 5: Supervision  Ambulation/Gait Assistance Details around clinic with session  Assistive device Straight cane  Gait Pattern Step-through pattern;Lateral hip instability;Narrow base of support  Ambulation Surface Level;Indoor  Neuro Re-ed   Neuro Re-ed Details  for balance/NMR: red mats<> stepping over bolster on solid floor<> blue mats with bean bags scattered under mats to increase challenge- gait across obstacle course for 6 laps, min guard to min assist with cane; in parallel bars: with BOSU- alternating step ups with contralateral march for 10 reps each side, light UE support on bars, min guard for safety; on inverted BOSU- single leg stance with contralateral kicks forward/lateral/backward for 5 reps each side. UE support with cues on posture and form. Min guard assist for safety.  Knee/Hip Exercises: Aerobic  Elliptical with bil  kAFO's for 3 minutes forward with UE support on level 1.0         PT Short Term Goals - 05/30/20 1612       PT SHORT TERM GOAL #1   Title Pt will improve functional aerobic endurance to perform elliptical x 5 minutes with Bioness in cycle mode without leaning heavily on UE for support    Baseline 2 minutes - regressed due to COVID and UTI    Time 4    Period Weeks    Status Revised     Target Date 06/29/20               PT Long Term Goals - 06/06/20 1455       PT LONG TERM GOAL #1   Title Patient will demo >54/56 on berg balance scale wearing bilat GRAFO's to improve functional balance and reduce fall risk  (ALL LTG DUE BY 07/29/20)    Baseline 52/56    Time 8    Period Weeks    Status Revised      PT LONG TERM GOAL #2   Title Patient will be able to ambulate >2000 feet outside with cane and bilat GRAFO MOD I to improve short distance community ambulation.    Baseline 2000 with cane and GRAFO, close supervision    Time 8    Period Weeks    Status Revised      PT LONG TERM GOAL #4   Title Pt will demonstrate ability to negotiate curb, ramp and 12 stairs with cane, bilat GRAFO's safely Mod I    Baseline Performs with supervision with cane    Time 8    Period Weeks    Status Revised               06/19/20 1413  Plan  Clinical Impression Statement Today's skilled session continued to focus on stengthening and balance training in bil AFO's with no issues reported or noted in session. The pt is making progess in they's ability to tolerate higher level ex's and balance training. The pt should benefit from continued PT to progress toward unmet goals.  Personal Factors and Comorbidities Age;Past/Current Experience;Comorbidity 2;Time since onset of injury/illness/exacerbation;Transportation;Comorbidity 1  Examination-Activity Limitations Bathing;Lift;Squat;Stairs;Stand;Transfers  Examination-Participation Restrictions Cleaning;Community Activity;Driving;Laundry;Occupation;Shop;Yard Work  Pt will benefit from skilled therapeutic intervention in order to improve on the following deficits Abnormal gait;Decreased activity tolerance;Decreased balance;Decreased mobility;Decreased endurance;Decreased coordination;Decreased strength;Difficulty walking;Impaired sensation;Impaired tone;Postural dysfunction;Pain  Rehab Potential Good  PT Frequency 2x / week  PT Duration  8 weeks  PT Treatment/Interventions ADLs/Self Care Home Management;Gait training;Stair training;Functional mobility training;Therapeutic activities;Therapeutic exercise;Balance training;Neuromuscular re-education;Patient/family education;Passive range of motion;Energy conservation;Electrical Stimulation;DME Instruction;Orthotic Fit/Training;Aquatic Therapy  PT Next Visit Plan Re-start aquatic PT.  Land based PT: May only use lower Bioness cuff to work on [employer] strength instead of the whole set up; work on gait on inclines, stairs with cane. Glute med strengthening; balance training for tandem and SLS balances.   Endurance with Elliptical  PT Home Exercise Plan Access Code: [email or web address]; [email or web address]. plus heel raises, and HS curls verbally added. Pt stated able to recall these without new handouts.  Consulted and Agree with Plan of Care Patient         Patient will benefit from skilled therapeutic intervention in order to improve the following deficits and impairments:  Abnormal gait, Decreased activity tolerance, Decreased balance, Decreased mobility, Decreased endurance, Decreased coordination, Decreased strength, Difficulty walking, Impaired sensation, Impaired tone, Postural dysfunction, Pain  Visit Diagnosis: Other abnormalities  of gait and mobility  Unsteadiness on feet  Difficulty in walking, not elsewhere classified  Muscle weakness (generalized)  Abnormal posture     Problem List Patient Active Problem List   Diagnosis Date Noted   Incomplete paraplegia ([place]) 11/08/2019   Foot drop, bilateral 11/08/2019   Spasticity 08/18/2019   Chronic pain syndrome 08/18/2019   Nerve pain 08/18/2019   Clonus 07/21/2019   Spinal cord tumor    Pain    Transaminitis    Leukocytosis    Neurogenic bowel    Postoperative pain    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

## 2020-06-26 DIAGNOSIS — R262 Difficulty in walking, not elsewhere classified: Secondary | ICD-10-CM | POA: Diagnosis not present

## 2020-06-26 DIAGNOSIS — R102 Pelvic and perineal pain: Secondary | ICD-10-CM | POA: Diagnosis not present

## 2020-06-26 DIAGNOSIS — N411 Chronic prostatitis: Secondary | ICD-10-CM | POA: Diagnosis not present

## 2020-06-26 DIAGNOSIS — M6281 Muscle weakness (generalized): Secondary | ICD-10-CM | POA: Diagnosis not present

## 2020-06-27 ENCOUNTER — Ambulatory Visit: Payer: BC Managed Care – PPO | Admitting: Physical Therapy

## 2020-06-27 ENCOUNTER — Encounter: Payer: Self-pay | Admitting: Physical Therapy

## 2020-06-27 ENCOUNTER — Other Ambulatory Visit: Payer: Self-pay

## 2020-06-27 DIAGNOSIS — R208 Other disturbances of skin sensation: Secondary | ICD-10-CM | POA: Diagnosis not present

## 2020-06-27 DIAGNOSIS — R293 Abnormal posture: Secondary | ICD-10-CM | POA: Diagnosis not present

## 2020-06-27 DIAGNOSIS — R262 Difficulty in walking, not elsewhere classified: Secondary | ICD-10-CM

## 2020-06-27 DIAGNOSIS — M6281 Muscle weakness (generalized): Secondary | ICD-10-CM | POA: Diagnosis not present

## 2020-06-27 DIAGNOSIS — R2689 Other abnormalities of gait and mobility: Secondary | ICD-10-CM | POA: Diagnosis not present

## 2020-06-27 DIAGNOSIS — R2681 Unsteadiness on feet: Secondary | ICD-10-CM | POA: Diagnosis not present

## 2020-07-01 NOTE — Therapy (Signed)
[facility] [address] [place], [place], [address] Phone: [phone]   Fax:  [phone]  Physical Therapy Treatment  Patient Details  Name: Jacob Lambert MRN: [id number] Date of Birth: 04-Jul-1985 Referring Provider (PT): Dr. Courtney Heys   Encounter Date: 06/27/2020   06/27/20 1537  PT Visits / Re-Eval  Visit Number 21  Number of Visits 70  Date for PT Re-Evaluation 07/29/20  Authorization  Authorization Type BCBS - VL: 30 PT/OT combined  Authorization - Visit Number 28  Authorization - Number of Visits 30  PT Time Calculation  PT Start Time 9767  PT Stop Time 1615  PT Time Calculation (min) 42 min  PT - End of Session  Equipment Utilized During Treatment Other (comment) (bil Lower cuff Bioness)  Activity Tolerance Patient tolerated treatment well  Behavior During Therapy WFL for tasks assessed/performed     Past Medical History:  Diagnosis Date   Anxiety    Clonus    Depression    Headache    Pneumonia    "walking"   Quadriplegia and quadriparesis ([place])    Spasticity    Spinal cord tumor    Wheelchair bound     Past Surgical History:  Procedure Laterality Date   LAMINECTOMY N/A 06/01/2019   Procedure: Cervical six-seven Laminoplasty, biopsy and resection of intramedullary spinal cord tumor;  Surgeon: [name], MD;  Location: [facility];  Service: Neurosurgery;  Laterality: N/A;   NO PAST SURGERIES      There were no vitals filed for this visit.    06/27/20 1535  Symptoms/Limitations  Subjective Tried cupping with urology for 1st time yesterday on they's left leg (upper half). Made the mucsles and they very tired. No falls.  Limitations Standing;Walking;House hold activities  How long can you sit comfortably? no issues  How long can you stand comfortably? <5 min  How long can you walk comfortably? <0.25 mile  Patient Stated Goals Progress walking       06/27/20 1538   Transfers  Transfers Sit to Stand;Stand to Sit  Sit to Stand 6: Modified independent (Device/Increase time)  Stand to Sit 6: Modified independent (Device/Increase time)  Ambulation/Gait  Ambulation/Gait Yes  Ambulation/Gait Assistance 5: Supervision  Ambulation/Gait Assistance Details around clinic with bil lower cuffs (Bioness).  Assistive device Rollator  Gait Pattern Step-through pattern;Lateral hip instability;Narrow base of support  Ambulation Surface Level;Indoor  Knee/Hip Exercises: Aerobic  Elliptical concurrent with Bioness to bil LE's lower cuffs onluy for 5 minutes on level 1.0 with light UE support initially upgrading to heavy UE support with right knee buckling ~3 times, pt self correcting  Electrical Stimulation  Electrical Stimulation Location bil LE lower cuffs only  Electrical Stimulation Action for increased muscle activation and strengthening  Electrical Stimulation Parameters refer to tablet 1; quick fit  Electrical Stimulation Goals Strength;Neuromuscular facilitation;Pain       06/27/20 1556  Balance Exercises: Standing  Other Standing Exercises on inverted BOSO: working on DF with on times, PF with off time for 4 sec's hold each way for 10 reps; then bil minisquats with 5 sec holds, 8 sec rest for 10 reps, bil UE support on bars with all of these, min guard assist; with BOSU blue side up- forward step up with on time 5 sec's, rest for 8 sec's, assist at knee for stability with both sides. bil UE support on bars.           PT Short Term Goals -  06/27/20 1700       PT SHORT TERM GOAL #1   Title Pt will improve functional aerobic endurance to perform elliptical x 5 minutes with Bioness in cycle mode without leaning heavily on UE for support    Baseline 06/27/20: met time component with heavy UE reliance for last 2.5 minutes    Status Partially Met    Target Date 06/29/20               PT Long Term Goals - 06/06/20 1455       PT LONG TERM GOAL  #1   Title Patient will demo >54/56 on berg balance scale wearing bilat GRAFO's to improve functional balance and reduce fall risk  (ALL LTG DUE BY 07/29/20)    Baseline 52/56    Time 8    Period Weeks    Status Revised      PT LONG TERM GOAL #2   Title Patient will be able to ambulate >2000 feet outside with cane and bilat GRAFO MOD I to improve short distance community ambulation.    Baseline 2000 with cane and GRAFO, close supervision    Time 8    Period Weeks    Status Revised      PT LONG TERM GOAL #4   Title Pt will demonstrate ability to negotiate curb, ramp and 12 stairs with cane, bilat GRAFO's safely Mod I    Baseline Performs with supervision with cane    Time 8    Period Weeks    Status Revised              06/27/20 1538  Plan  Clinical Impression Statement Today's skilled session initally focused on progress toward STG with goal partially met. Remainder of session continued with use of bil lower cuff Bioness with strengthening and balance activities with no issues noted or reported in session. The pt is progressing toward goals and should benefit from continued PT to progress toward unmet goals.  Personal Factors and Comorbidities Age;Past/Current Experience;Comorbidity 2;Time since onset of injury/illness/exacerbation;Transportation;Comorbidity 1  Examination-Activity Limitations Bathing;Lift;Squat;Stairs;Stand;Transfers  Examination-Participation Restrictions Cleaning;Community Activity;Driving;Laundry;Occupation;Shop;Yard Work  Pt will benefit from skilled therapeutic intervention in order to improve on the following deficits Abnormal gait;Decreased activity tolerance;Decreased balance;Decreased mobility;Decreased endurance;Decreased coordination;Decreased strength;Difficulty walking;Impaired sensation;Impaired tone;Postural dysfunction;Pain  Rehab Potential Good  PT Frequency 2x / week  PT Duration 8 weeks  PT Treatment/Interventions ADLs/Self Care Home  Management;Gait training;Stair training;Functional mobility training;Therapeutic activities;Therapeutic exercise;Balance training;Neuromuscular re-education;Patient/family education;Passive range of motion;Energy conservation;Electrical Stimulation;DME Instruction;Orthotic Fit/Training;Aquatic Therapy  PT Next Visit Plan Re-start aquatic PT.  Land based PT: May only use lower Bioness cuff to work on [employer] strength instead of the whole set up; work on gait on inclines, stairs with cane. Glute med strengthening; balance training for tandem and SLS balances.   Endurance with Elliptical  PT Home Exercise Plan Access Code: [email or web address]; [email or web address]. plus heel raises, and HS curls verbally added. Pt stated able to recall these without new handouts.  Consulted and Agree with Plan of Care Patient          Patient will benefit from skilled therapeutic intervention in order to improve the following deficits and impairments:  Abnormal gait, Decreased activity tolerance, Decreased balance, Decreased mobility, Decreased endurance, Decreased coordination, Decreased strength, Difficulty walking, Impaired sensation, Impaired tone, Postural dysfunction, Pain  Visit Diagnosis: Other abnormalities of gait and mobility  Unsteadiness on feet  Difficulty in walking, not elsewhere classified  Muscle weakness (generalized)     Problem List Patient  Active Problem List   Diagnosis Date Noted   Incomplete paraplegia ([place]) 11/08/2019   Foot drop, bilateral 11/08/2019   Spasticity 08/18/2019   Chronic pain syndrome 08/18/2019   Nerve pain 08/18/2019   Clonus 07/21/2019   Spinal cord tumor    Pain    Transaminitis    Leukocytosis    Neurogenic bowel    Postoperative pain    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

## 2020-07-05 ENCOUNTER — Other Ambulatory Visit: Payer: Self-pay

## 2020-07-05 ENCOUNTER — Ambulatory Visit: Payer: BC Managed Care – PPO | Attending: Physical Medicine and Rehabilitation | Admitting: Physical Therapy

## 2020-07-05 DIAGNOSIS — R208 Other disturbances of skin sensation: Secondary | ICD-10-CM | POA: Insufficient documentation

## 2020-07-05 DIAGNOSIS — M6281 Muscle weakness (generalized): Secondary | ICD-10-CM | POA: Insufficient documentation

## 2020-07-05 DIAGNOSIS — R2681 Unsteadiness on feet: Secondary | ICD-10-CM | POA: Insufficient documentation

## 2020-07-05 DIAGNOSIS — R2689 Other abnormalities of gait and mobility: Secondary | ICD-10-CM | POA: Diagnosis not present

## 2020-07-05 DIAGNOSIS — R262 Difficulty in walking, not elsewhere classified: Secondary | ICD-10-CM | POA: Diagnosis not present

## 2020-07-05 DIAGNOSIS — R278 Other lack of coordination: Secondary | ICD-10-CM | POA: Insufficient documentation

## 2020-07-05 NOTE — Therapy (Signed)
[facility] [address] [place], [place], [address] Phone: [phone]   Fax:  [phone]  Physical Therapy Treatment  Patient Details  Name: Jacob Lambert MRN: [id number] Date of Birth: 04/22/1985 Referring Provider (PT): Dr. Courtney Heys   Encounter Date: 07/05/2020   PT End of Session - 07/05/20 1625     Visit Number 45    Number of Visits 62    Date for PT Re-Evaluation 07/29/20    Authorization Type BCBS - VL: 30 PT/OT combined    Authorization - Visit Number 59    Authorization - Number of Visits 30    PT Start Time 1400    PT Stop Time 1445    PT Time Calculation (min) 45 min    Activity Tolerance Patient tolerated treatment well    Behavior During Therapy WFL for tasks assessed/performed             Past Medical History:  Diagnosis Date   Anxiety    Clonus    Depression    Headache    Pneumonia    "walking"   Quadriplegia and quadriparesis ([place])    Spasticity    Spinal cord tumor    Wheelchair bound     Past Surgical History:  Procedure Laterality Date   LAMINECTOMY N/A 06/01/2019   Procedure: Cervical six-seven Laminoplasty, biopsy and resection of intramedullary spinal cord tumor;  Surgeon: [name], MD;  Location: [facility];  Service: Neurosurgery;  Laterality: N/A;   NO PAST SURGERIES      There were no vitals filed for this visit.   Subjective Assessment - 07/05/20 1405     Subjective Second eye surgery is set for August.  Plans to finish therapy through end of July, have surgery and then start process of moving to [place].    Limitations Standing;Walking;House hold activities    How long can you sit comfortably? no issues    How long can you stand comfortably? <5 min    How long can you walk comfortably? <0.25 mile    Patient Stated Goals Progress walking    Currently in Pain? No/denies                               [facility] Adult PT  Treatment/Exercise - 07/05/20 1424       Exercises   Exercises Knee/Hip      Knee/Hip Exercises: Aerobic   Tread Mill 1.3 mph x 6 minutes forwards with 3% > 5% incline to simulate walking uphill.  Changed to walking downhill on 3% grade at 0.8 mph x 2 minutes      Knee/Hip Exercises: Supine   Other Supine Knee/Hip Exercises With bilat feet on physioball performed 10 reps straight leg bridges with cues to keep slight bend in knees and cues to keep ball steady in midline; positioned LE at 90/90 with feet heels on ball and performed 10 reps each LE, alternating LE lifts from ball with contralateral LE stabilizing on ball.      Knee/Hip Exercises: Sidelying   Hip ABduction Strengthening;Right;Left;2 sets;10 reps    Hip ABduction Limitations R and L side with green theraband around LE: 10 reps open chain ABD with isometric hold and controlled lower.  Then performed isometric open chain ABD with hip flexion > extension x 8 reps.  Performed on each side  PT Education - 07/05/20 1644     Education Details adjusted land visits to different days to avoid conflicting with aquatic therapy    Person(s) Educated Patient    Methods Explanation    Comprehension Verbalized understanding              PT Short Term Goals - 06/27/20 1700       PT SHORT TERM GOAL #1   Title Pt will improve functional aerobic endurance to perform elliptical x 5 minutes with Bioness in cycle mode without leaning heavily on UE for support    Baseline 06/27/20: met time component with heavy UE reliance for last 2.5 minutes    Status Partially Met    Target Date 06/29/20               PT Long Term Goals - 06/06/20 1455       PT LONG TERM GOAL #1   Title Patient will demo >54/56 on berg balance scale wearing bilat GRAFO's to improve functional balance and reduce fall risk  (ALL LTG DUE BY 07/29/20)    Baseline 52/56    Time 8    Period Weeks    Status Revised      PT LONG TERM  GOAL #2   Title Patient will be able to ambulate >2000 feet outside with cane and bilat GRAFO MOD I to improve short distance community ambulation.    Baseline 2000 with cane and GRAFO, close supervision    Time 8    Period Weeks    Status Revised      PT LONG TERM GOAL #4   Title Pt will demonstrate ability to negotiate curb, ramp and 12 stairs with cane, bilat GRAFO's safely Mod I    Baseline Performs with supervision with cane    Time 8    Period Weeks    Status Revised                   Plan - 07/05/20 1626     Clinical Impression Statement Using Bilat AFO continued to focus on gait training on inclines (using treadmill for continuous ambulation) uphill and downhill with UE support; pt unable to perform safely without UE support.  Also added in glute med and core training with sidelying resisted hip ABD and physioball exercises.  Due to land visits conflicting with aquatic visits, rescheduled land visits for same week, different day.  Will continue to address through end of the month when pt will be D/C to have eye surgery and then proceed to moving to [place].    Personal Factors and Comorbidities Age;Past/Current Experience;Comorbidity 2;Time since onset of injury/illness/exacerbation;Transportation;Comorbidity 1    Examination-Activity Limitations Bathing;Lift;Squat;Stairs;Stand;Transfers    Examination-Participation Restrictions Cleaning;Community Activity;Driving;Laundry;Occupation;Shop;Yard Work    Rehab Potential Good    PT Frequency 2x / week    PT Duration 8 weeks    PT Treatment/Interventions ADLs/Self Care Home Management;Gait training;Stair training;Functional mobility training;Therapeutic activities;Therapeutic exercise;Balance training;Neuromuscular re-education;Patient/family education;Passive range of motion;Energy conservation;Electrical Stimulation;DME Instruction;Orthotic Fit/Training;Aquatic Therapy    PT Next Visit Plan Aquatic PT: core training, glute med  training, standing balance.          Land based PT: May only use lower Bioness cuff to work on [employer] strength instead of the whole set up; work on gait on inclines-treadmill uphill and downhill, stairs with cane. Glute med strengthening sidelying and physioball, balance training for tandem and SLS balances.   Endurance with Elliptical    PT Home Exercise Plan  Access Code: [email or web address]; [email or web address]. plus heel raises, and HS curls verbally added. Pt stated able to recall these without new handouts.    Consulted and Agree with Plan of Care Patient             Patient will benefit from skilled therapeutic intervention in order to improve the following deficits and impairments:  Abnormal gait, Decreased activity tolerance, Decreased balance, Decreased mobility, Decreased endurance, Decreased coordination, Decreased strength, Difficulty walking, Impaired sensation, Impaired tone, Postural dysfunction, Pain  Visit Diagnosis: Other abnormalities of gait and mobility  Unsteadiness on feet  Difficulty in walking, not elsewhere classified  Muscle weakness (generalized)  Other disturbances of skin sensation     Problem List Patient Active Problem List   Diagnosis Date Noted   Incomplete paraplegia ([place]) 11/08/2019   Foot drop, bilateral 11/08/2019   Spasticity 08/18/2019   Chronic pain syndrome 08/18/2019   Nerve pain 08/18/2019   Clonus 07/21/2019   Spinal cord tumor    Pain    Transaminitis    Leukocytosis    Neurogenic bowel    Postoperative pain    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 [place], [place], [address] Phone: [phone]   Fax:   [phone]  Name: Jacob Lambert MRN: [id number] Date of Birth: [birth year]

## 2020-07-11 ENCOUNTER — Other Ambulatory Visit: Payer: Self-pay

## 2020-07-11 ENCOUNTER — Ambulatory Visit: Payer: BC Managed Care – PPO | Admitting: Physical Therapy

## 2020-07-11 DIAGNOSIS — R2689 Other abnormalities of gait and mobility: Secondary | ICD-10-CM | POA: Diagnosis not present

## 2020-07-11 DIAGNOSIS — R278 Other lack of coordination: Secondary | ICD-10-CM | POA: Diagnosis not present

## 2020-07-11 DIAGNOSIS — M6281 Muscle weakness (generalized): Secondary | ICD-10-CM

## 2020-07-11 DIAGNOSIS — R262 Difficulty in walking, not elsewhere classified: Secondary | ICD-10-CM | POA: Diagnosis not present

## 2020-07-11 DIAGNOSIS — R208 Other disturbances of skin sensation: Secondary | ICD-10-CM | POA: Diagnosis not present

## 2020-07-11 DIAGNOSIS — R2681 Unsteadiness on feet: Secondary | ICD-10-CM

## 2020-07-11 NOTE — Therapy (Signed)
[facility] [address] [place], [place], [address] Phone: [phone]   Fax:  [phone]  Physical Therapy Treatment  Patient Details  Name: Jacob Lambert MRN: [id number] Date of Birth: [birth year] Referring Provider (PT): Dr. Courtney Heys   Encounter Date: 07/11/2020   PT End of Session - 07/11/20 1548     Visit Number 57    Number of Visits 70    Date for PT Re-Evaluation 07/29/20    Authorization Type BCBS - VL: 30 PT/OT combined    Authorization - Visit Number 30    Authorization - Number of Visits 30    PT Start Time 2820    PT Stop Time 1400    PT Time Calculation (min) 43 min    Activity Tolerance Patient tolerated treatment well    Behavior During Therapy WFL for tasks assessed/performed             Past Medical History:  Diagnosis Date   Anxiety    Clonus    Depression    Headache    Pneumonia    "walking"   Quadriplegia and quadriparesis ([place])    Spasticity    Spinal cord tumor    Wheelchair bound     Past Surgical History:  Procedure Laterality Date   LAMINECTOMY N/A 06/01/2019   Procedure: Cervical six-seven Laminoplasty, biopsy and resection of intramedullary spinal cord tumor;  Surgeon: [name], MD;  Location: [facility];  Service: Neurosurgery;  Laterality: N/A;   NO PAST SURGERIES      There were no vitals filed for this visit.   Subjective Assessment - 07/11/20 1323     Subjective Pt was able to find an apartment in [place].  Is on second level but has elevator.  Would have to use stairs if elevator broken or during fire alarm.    Limitations Standing;Walking;House hold activities    How long can you sit comfortably? no issues    How long can you stand comfortably? <5 min    How long can you walk comfortably? <0.25 mile    Patient Stated Goals Progress walking    Currently in Pain? No/denies              [facility] Adult PT Treatment/Exercise - 07/11/20 1350        Ambulation/Gait   Stairs Yes    Stairs Assistance 5: Supervision    Stairs Assistance Details (indicate cue type and reason) with bilat GRAFO's donned, cane in LUE and rail in R when ascending, rail on L, cane on R when descending.  Utilized step to sequence for ascending and descending.  Pt demonstrated good technique when descending allowing foot and tibia to tip forwards due to lack of ankle rocker.  Mild L knee instability noted when descending with RLE.  Advised pt to descend with LLE each step.    Stair Management Technique One rail Right;Step to pattern;Forwards;With cane;Other (comment)   bilat GRAFO's   Number of Stairs 12    Height of Stairs 6      Therapeutic Activites    Therapeutic Activities Lifting;Other Therapeutic Activities    Lifting Pt is concerned about how to lift heavier items off floor without LOB or knee instability due to having to wear bilat GRAFOs.  With lighter crate (no weights in it), reviewed with pt how to widen or stagger stance (to put one foot beside crate) to allow pt to squat down to pick up item from floor  without putting increased stress on AFO's.  Pt return demonstrated stance but experienced 2 episodes of anterior LOB requiring mod A from therapist to prevent a fall.  Will continue to address.    Other Therapeutic Activities Discussed patient's set up at new apartment in [place].  Discussed terrain around building, entry and set up once inside apartment.  Pt is on second level but has elevator.  Discussed being prepared to use stairs if unable to use elevator.  Pt would like to continue to work on stairs and inclines.      Knee/Hip Exercises: Aerobic   Tread Mill 1.6 mph x 6 minutes at 4, 5, 6% incline with bilat UE support, decreasing speed during final 1-2 minutes due to LE fatigue and instability.  Then turned around and put incline at 4% x 0.9 mph to simulate walking downhill x 4 minutes but had to stop with 30 seconds remaining due to LE fatigue.                 PT Education - 07/11/20 1553     Education Details changing stance when picking up object from floor due to GRAFO's to improve balance    Person(s) Educated Patient    Methods Explanation;Demonstration    Comprehension Need further instruction              PT Short Term Goals - 06/27/20 1700       PT SHORT TERM GOAL #1   Title Pt will improve functional aerobic endurance to perform elliptical x 5 minutes with Bioness in cycle mode without leaning heavily on UE for support    Baseline 06/27/20: met time component with heavy UE reliance for last 2.5 minutes    Status Partially Met    Target Date 06/29/20               PT Long Term Goals - 06/06/20 1455       PT LONG TERM GOAL #1   Title Patient will demo >54/56 on berg balance scale wearing bilat GRAFO's to improve functional balance and reduce fall risk  (ALL LTG DUE BY 07/29/20)    Baseline 52/56    Time 8    Period Weeks    Status Revised      PT LONG TERM GOAL #2   Title Patient will be able to ambulate >2000 feet outside with cane and bilat GRAFO MOD I to improve short distance community ambulation.    Baseline 2000 with cane and GRAFO, close supervision    Time 8    Period Weeks    Status Revised      PT LONG TERM GOAL #4   Title Pt will demonstrate ability to negotiate curb, ramp and 12 stairs with cane, bilat GRAFO's safely Mod I    Baseline Performs with supervision with cane    Time 8    Period Weeks    Status Revised                   Plan - 07/11/20 1548     Clinical Impression Statement To continue to prepare for patient's transition to [place] performed stair negotiation training with one rail and cane; performed full flight of stairs switching cane to opposite hand when descending to allow other hand to be on rail.  Also continued to progress gait training on treadmill by increasing incline %grade and speed of ambulation.  With increased speed pt fatigued quicker  but able to tolerate increased incline  if speed was slowed.  Initiated training of technique for reaching down to ground to pick up light object when wearing bilat GRAFO.  Will continue to address and pt to resume aquatic therapy next week.    Personal Factors and Comorbidities Age;Past/Current Experience;Comorbidity 2;Time since onset of injury/illness/exacerbation;Transportation;Comorbidity 1    Examination-Activity Limitations Bathing;Lift;Squat;Stairs;Stand;Transfers    Examination-Participation Restrictions Cleaning;Community Activity;Driving;Laundry;Occupation;Shop;Yard Work    Rehab Potential Good    PT Frequency 2x / week    PT Duration 8 weeks    PT Treatment/Interventions ADLs/Self Care Home Management;Gait training;Stair training;Functional mobility training;Therapeutic activities;Therapeutic exercise;Balance training;Neuromuscular re-education;Patient/family education;Passive range of motion;Energy conservation;Electrical Stimulation;DME Instruction;Orthotic Fit/Training;Aquatic Therapy    PT Next Visit Plan Aquatic PT: core training, glute med training, standing balance.          Land based PT: work on gait on inclines-treadmill uphill and downhill 5-6% grade, full flight of stairs with cane and R rail.  Problem solve how to pick up heavier items from floor with bilat GRAFO's on.      Glute med strengthening sidelying and physioball, balance training for tandem and SLS balances.   Endurance with Elliptical    PT Home Exercise Plan Access Code: [email or web address]; [email or web address]. plus heel raises, and HS curls verbally added. Pt stated able to recall these without new handouts.    Consulted and Agree with Plan of Care Patient             Patient will benefit from skilled therapeutic intervention in order to improve the following deficits and impairments:  Abnormal gait, Decreased activity tolerance, Decreased balance, Decreased mobility, Decreased endurance, Decreased coordination, Decreased strength,  Difficulty walking, Impaired sensation, Impaired tone, Postural dysfunction, Pain  Visit Diagnosis: Other abnormalities of gait and mobility  Unsteadiness on feet  Difficulty in walking, not elsewhere classified  Muscle weakness (generalized)  Other disturbances of skin sensation  Other lack of coordination     Problem List Patient Active Problem List   Diagnosis Date Noted   Incomplete paraplegia ([place]) 11/08/2019   Foot drop, bilateral 11/08/2019   Spasticity 08/18/2019   Chronic pain syndrome 08/18/2019   Nerve pain 08/18/2019   Clonus 07/21/2019   Spinal cord tumor    Pain    Transaminitis    Leukocytosis    Neurogenic bowel    Postoperative pain    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

## 2020-07-12 ENCOUNTER — Ambulatory Visit: Payer: BC Managed Care – PPO | Admitting: Physical Therapy

## 2020-07-12 DIAGNOSIS — R300 Dysuria: Secondary | ICD-10-CM | POA: Diagnosis not present

## 2020-07-12 DIAGNOSIS — R102 Pelvic and perineal pain: Secondary | ICD-10-CM | POA: Diagnosis not present

## 2020-07-12 DIAGNOSIS — N411 Chronic prostatitis: Secondary | ICD-10-CM | POA: Diagnosis not present

## 2020-07-12 DIAGNOSIS — N5312 Painful ejaculation: Secondary | ICD-10-CM | POA: Diagnosis not present

## 2020-07-17 ENCOUNTER — Ambulatory Visit: Payer: BC Managed Care – PPO | Admitting: Physical Therapy

## 2020-07-17 ENCOUNTER — Other Ambulatory Visit: Payer: Self-pay

## 2020-07-17 ENCOUNTER — Encounter: Payer: Self-pay | Admitting: Physical Therapy

## 2020-07-17 DIAGNOSIS — R2681 Unsteadiness on feet: Secondary | ICD-10-CM | POA: Diagnosis not present

## 2020-07-17 DIAGNOSIS — R278 Other lack of coordination: Secondary | ICD-10-CM | POA: Diagnosis not present

## 2020-07-17 DIAGNOSIS — M6281 Muscle weakness (generalized): Secondary | ICD-10-CM

## 2020-07-17 DIAGNOSIS — R2689 Other abnormalities of gait and mobility: Secondary | ICD-10-CM | POA: Diagnosis not present

## 2020-07-17 DIAGNOSIS — R208 Other disturbances of skin sensation: Secondary | ICD-10-CM | POA: Diagnosis not present

## 2020-07-17 DIAGNOSIS — R262 Difficulty in walking, not elsewhere classified: Secondary | ICD-10-CM | POA: Diagnosis not present

## 2020-07-19 ENCOUNTER — Ambulatory Visit: Payer: BC Managed Care – PPO | Admitting: Physical Therapy

## 2020-07-19 ENCOUNTER — Other Ambulatory Visit: Payer: Self-pay

## 2020-07-19 ENCOUNTER — Ambulatory Visit (HOSPITAL_BASED_OUTPATIENT_CLINIC_OR_DEPARTMENT_OTHER): Payer: BC Managed Care – PPO | Attending: Physical Medicine and Rehabilitation | Admitting: Physical Therapy

## 2020-07-19 ENCOUNTER — Encounter (HOSPITAL_BASED_OUTPATIENT_CLINIC_OR_DEPARTMENT_OTHER): Payer: Self-pay | Admitting: Physical Therapy

## 2020-07-19 DIAGNOSIS — R2689 Other abnormalities of gait and mobility: Secondary | ICD-10-CM | POA: Insufficient documentation

## 2020-07-19 DIAGNOSIS — R208 Other disturbances of skin sensation: Secondary | ICD-10-CM | POA: Diagnosis not present

## 2020-07-19 DIAGNOSIS — R293 Abnormal posture: Secondary | ICD-10-CM | POA: Insufficient documentation

## 2020-07-19 DIAGNOSIS — R278 Other lack of coordination: Secondary | ICD-10-CM | POA: Diagnosis not present

## 2020-07-19 DIAGNOSIS — R2681 Unsteadiness on feet: Secondary | ICD-10-CM | POA: Insufficient documentation

## 2020-07-19 DIAGNOSIS — D497 Neoplasm of unspecified behavior of endocrine glands and other parts of nervous system: Secondary | ICD-10-CM | POA: Insufficient documentation

## 2020-07-19 DIAGNOSIS — M6281 Muscle weakness (generalized): Secondary | ICD-10-CM | POA: Insufficient documentation

## 2020-07-19 DIAGNOSIS — R262 Difficulty in walking, not elsewhere classified: Secondary | ICD-10-CM | POA: Diagnosis not present

## 2020-07-19 DIAGNOSIS — R41842 Visuospatial deficit: Secondary | ICD-10-CM | POA: Insufficient documentation

## 2020-07-19 DIAGNOSIS — R52 Pain, unspecified: Secondary | ICD-10-CM | POA: Insufficient documentation

## 2020-07-19 NOTE — Therapy (Signed)
[facility] [place], [place], [address] Phone: [phone]   Fax:  [phone]  Physical Therapy Treatment  Patient Details  Name: Jacob Lambert MRN: [id number] Date of Birth: 05/30/1985 Referring Provider (PT): Dr. Courtney Heys   Encounter Date: 07/19/2020   PT End of Session - 07/19/20 1459     Visit Number 60    Number of Visits 70    Date for PT Re-Evaluation 07/29/20    Authorization Type BCBS - VL: 30 PT/OT combined    PT Start Time 1120    PT Stop Time 1200    PT Time Calculation (min) 40 min    Equipment Utilized During Treatment Other (comment)    Activity Tolerance Patient tolerated treatment well    Behavior During Therapy WFL for tasks assessed/performed             Past Medical History:  Diagnosis Date   Anxiety    Clonus    Depression    Headache    Pneumonia    "walking"   Quadriplegia and quadriparesis ([place])    Spasticity    Spinal cord tumor    Wheelchair bound     Past Surgical History:  Procedure Laterality Date   LAMINECTOMY N/A 06/01/2019   Procedure: Cervical six-seven Laminoplasty, biopsy and resection of intramedullary spinal cord tumor;  Surgeon: [name], MD;  Location: [facility];  Service: Neurosurgery;  Laterality: N/A;   NO PAST SURGERIES      There were no vitals filed for this visit.   Subjective Assessment - 07/19/20 1458     Subjective Pt ready to get back in water.  Has all medical procedures planned and scheduled for prior to move.  No complaints    Currently in Pain? No/denies              Pt seen for aquatic therapy today.  Treatment took place in water 3.25-4.8 ft in depth at the [facility] pool. Temp of water was 91.  Pt entered/exited the pool via stairs step to pattern independently with bilat rail.  Warm upand cool down: forward, backward and side stepping/walking cues for increased step length using noodle for balance x  3 lengths  Standing Stretching: Hamstrings and gastroc then hip flexors using noodle standing 75% submerged 3 x 30 sec. Step ups on water step forward, backward and side stepping x 15 reps leading in forward and back position with each le pt supported by noodle.   Cuing for abdominal and glute tightness as completing the following Hip flex/extension 2 x 10 Abd/add 2 x 10 reps   Vertical suspension Straddling noodle bicycling x 5 min cuing for increased pace for 20 seconds on the minute as tolerated.  Pt requires buoyancy for support and to offload joints with strengthening exercises. Viscosity of the water is needed for resistance of strengthening; water current perturbations provides challenge to standing balance unsupported, requiring increased core activation.                            PT Short Term Goals - 06/27/20 1700       PT SHORT TERM GOAL #1   Title Pt will improve functional aerobic endurance to perform elliptical x 5 minutes with Bioness in cycle mode without leaning heavily on UE for support    Baseline 06/27/20: met time component with heavy UE reliance for last 2.5 minutes  Status Partially Met    Target Date 06/29/20               PT Long Term Goals - 06/06/20 1455       PT LONG TERM GOAL #1   Title Patient will demo >54/56 on berg balance scale wearing bilat GRAFO's to improve functional balance and reduce fall risk  (ALL LTG DUE BY 07/29/20)    Baseline 52/56    Time 8    Period Weeks    Status Revised      PT LONG TERM GOAL #2   Title Patient will be able to ambulate >2000 feet outside with cane and bilat GRAFO MOD I to improve short distance community ambulation.    Baseline 2000 with cane and GRAFO, close supervision    Time 8    Period Weeks    Status Revised      PT LONG TERM GOAL #4   Title Pt will demonstrate ability to negotiate curb, ramp and 12 stairs with cane, bilat GRAFO's safely Mod I    Baseline Performs with  supervision with cane    Time 8    Period Weeks    Status Revised                   Plan - 07/19/20 1500     Clinical Impression Statement Addressed stair climbing in aquatic setting.  Able to add balance and core strengthening without worries of LOB, aerobic capacity challenges tolerated well. Pt with good body awareness and comfortable to water. No reports of pain throughout session. Puts forth good effort    Personal Factors and Comorbidities Age;Past/Current Experience;Comorbidity 2;Time since onset of injury/illness/exacerbation;Transportation;Comorbidity 1    Rehab Potential Good    PT Frequency 2x / week    PT Duration 8 weeks    PT Treatment/Interventions ADLs/Self Care Home Management;Gait training;Stair training;Functional mobility training;Therapeutic activities;Therapeutic exercise;Balance training;Neuromuscular re-education;Patient/family education;Passive range of motion;Energy conservation;Electrical Stimulation;DME Instruction;Orthotic Fit/Training;Aquatic Therapy    PT Home Exercise Plan Access Code: [email or web address]; [email or web address]. plus heel raises, and HS curls verbally added. Pt stated able to recall these without new handouts.             Patient will benefit from skilled therapeutic intervention in order to improve the following deficits and impairments:  Abnormal gait, Decreased activity tolerance, Decreased balance, Decreased mobility, Decreased endurance, Decreased coordination, Decreased strength, Difficulty walking, Impaired sensation, Impaired tone, Postural dysfunction, Pain  Visit Diagnosis: Other abnormalities of gait and mobility  Other lack of coordination  Abnormal posture  Unsteadiness on feet  Difficulty in walking, not elsewhere classified  Visuospatial deficit  Spinal cord tumor  Muscle weakness (generalized)  Other disturbances of skin sensation  Pain     Problem List Patient Active Problem List   Diagnosis Date Noted   Incomplete  paraplegia ([place]) 11/08/2019   Foot drop, bilateral 11/08/2019   Spasticity 08/18/2019   Chronic pain syndrome 08/18/2019   Nerve pain 08/18/2019   Clonus 07/21/2019   Spinal cord tumor    Pain    Transaminitis    Leukocytosis    Neurogenic bowel    Postoperative pain    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

## 2020-07-19 NOTE — Therapy (Signed)
[facility] [address] [place], [place], [address] Phone: [phone]   Fax:  [phone]  Physical Therapy Treatment  Patient Details  Name: Jacob Lambert MRN: [id number] Date of Birth: August 14, 1985 Referring Provider (PT): Dr. Courtney Heys   Encounter Date: 07/17/2020    07/17/20 1538  PT Visits / Re-Eval  Visit Number 82  Number of Visits 10  Date for PT Re-Evaluation 07/29/20  Authorization  Authorization Type BCBS - VL: 30 PT/OT combined  Authorization - Visit Number 31 (pt aware that they have used all visits, understands and willing to pay out of pocket)  Authorization - Number of Visits 30  PT Time Calculation  PT Start Time 1535  PT Stop Time 1615  PT Time Calculation (min) 40 min  PT - End of Session  Equipment Utilized During Treatment Other (comment) (bil AFO's)  Activity Tolerance Patient tolerated treatment well  Behavior During Therapy WFL for tasks assessed/performed    Past Medical History:  Diagnosis Date   Anxiety    Clonus    Depression    Headache    Pneumonia    "walking"   Quadriplegia and quadriparesis ([place])    Spasticity    Spinal cord tumor    Wheelchair bound     Past Surgical History:  Procedure Laterality Date   LAMINECTOMY N/A 06/01/2019   Procedure: Cervical six-seven Laminoplasty, biopsy and resection of intramedullary spinal cord tumor;  Surgeon: [name], MD;  Location: [facility];  Service: Neurosurgery;  Laterality: N/A;   NO PAST SURGERIES      There were no vitals filed for this visit.     07/17/20 1537  Symptoms/Limitations  Subjective No new complaints. Still getting things in order for move to [place].  Limitations Standing;Walking;House hold activities  How long can you sit comfortably? no issues  How long can you stand comfortably? <5 min  How long can you walk comfortably? <0.25 mile  Patient Stated Goals Progress walking  Pain  Assessment  Currently in Pain? No/denies  Pain Score 0        07/17/20 1541  Transfers  Transfers Sit to Stand;Stand to Sit  Sit to Stand 6: Modified independent (Device/Increase time)  Stand to Sit 6: Modified independent (Device/Increase time)  Ambulation/Gait  Ambulation/Gait Yes  Ambulation/Gait Assistance 5: Supervision  Ambulation/Gait Assistance Details use of rollator into/out of session. use of straight cane in session.  Assistive device Rollator;Straight cane  Gait Pattern Step-through pattern;Lateral hip instability;Narrow base of support  Ambulation Surface Level;Indoor  Stairs Yes  Stairs Assistance 5: Supervision  Stairs Assistance Details (indicate cue type and reason) with bil GRAFO's  Stair Management Technique One rail Right;Step to pattern;Forwards;With cane;Other (comment)  Number of Stairs 4 (x 6 reps)  Height of Stairs 6  Self-Care  Self-Care Other Self-Care Comments  Other Self-Care Comments  continue to assist pt with problem solving move to [place]. Working on problem solving having equipement here and there while in transition. Plan to take the power chair, standard walker and canes. Leaning toward getting a second rollator for there so has the other one here during transition. Planning to rent a power chair in [place] in Sept, then flies to [place] for family reuinon and dad will bring manual chair there for they to fly home to [facility] with.  Therapeutic Activites   Therapeutic Activities Lifting  Lifting working on lifting from floor<>setting on seat of rollator. started with empty crate floor<>rollator seat with min  guard assist, then lifting from floor>carrying for 10 feet turning around and walking back to mat table to sit down, min guard assist for balance. static standing lifting crate up/down to floor for 3 reps no weight, then with 5# weight in crate: floor>stepping 3 feet to set onto seat of rollator>off rollator>stepping back to set on floor for 5 reps, min  guard assist with cues for posture, technique and to use legs vs back with lifting; then with 10# weighted crate- lifting from floor>walking forward to set on rollator seat>lifting from rollator seat>walking backward to set back on floor for 4 reps with min guard assist, cues on stance/technique. limited more by left middle finger neuropathtic pain with bending/lifiting weight. Perfromed all reps with left foot in back as it's they's strongest leg and offers more stability .           PT Short Term Goals - 06/27/20 1700       PT SHORT TERM GOAL #1   Title Pt will improve functional aerobic endurance to perform elliptical x 5 minutes with Bioness in cycle mode without leaning heavily on UE for support    Baseline 06/27/20: met time component with heavy UE reliance for last 2.5 minutes    Status Partially Met    Target Date 06/29/20               PT Long Term Goals - 06/06/20 1455       PT LONG TERM GOAL #1   Title Patient will demo >54/56 on berg balance scale wearing bilat GRAFO's to improve functional balance and reduce fall risk  (ALL LTG DUE BY 07/29/20)    Baseline 52/56    Time 8    Period Weeks    Status Revised      PT LONG TERM GOAL #2   Title Patient will be able to ambulate >2000 feet outside with cane and bilat GRAFO MOD I to improve short distance community ambulation.    Baseline 2000 with cane and GRAFO, close supervision    Time 8    Period Weeks    Status Revised      PT LONG TERM GOAL #4   Title Pt will demonstrate ability to negotiate curb, ramp and 12 stairs with cane, bilat GRAFO's safely Mod I    Baseline Performs with supervision with cane    Time 8    Period Weeks    Status Revised               07/17/20 1539  Plan  Clinical Impression Statement Today's skilled session continued to focus on stair negotiation with cane/rail in prep for new apartment set up and lifitng technique with progressively heavier items from floor. No issues noted  or reported in session. The pt is making steady progress toward goals and should benefit from continued PT to progress toward unmet goals.  Personal Factors and Comorbidities Age;Past/Current Experience;Comorbidity 2;Time since onset of injury/illness/exacerbation;Transportation;Comorbidity 1  Examination-Activity Limitations Bathing;Lift;Squat;Stairs;Stand;Transfers  Examination-Participation Restrictions Cleaning;Community Activity;Driving;Laundry;Occupation;Shop;Yard Work  Pt will benefit from skilled therapeutic intervention in order to improve on the following deficits Abnormal gait;Decreased activity tolerance;Decreased balance;Decreased mobility;Decreased endurance;Decreased coordination;Decreased strength;Difficulty walking;Impaired sensation;Impaired tone;Postural dysfunction;Pain  Rehab Potential Good  PT Frequency 2x / week  PT Duration 8 weeks  PT Treatment/Interventions ADLs/Self Care Home Management;Gait training;Stair training;Functional mobility training;Therapeutic activities;Therapeutic exercise;Balance training;Neuromuscular re-education;Patient/family education;Passive range of motion;Energy conservation;Electrical Stimulation;DME Instruction;Orthotic Fit/Training;Aquatic Therapy  PT Next Visit Plan Aquatic PT: core training, glute med training, standing balance.  Land based PT: work on gait on inclines-treadmill uphill and downhill 5-6% grade, full flight of stairs with cane and R rail.  Problem solve how to pick up heavier items from floor with bilat GRAFO's on.   Glute med strengthening sidelying and physioball, balance training for tandem and SLS balances.   Endurance with Elliptical  PT Home Exercise Plan Access Code: [email or web address]; [email or web address]. plus heel raises, and HS curls verbally added. Pt stated able to recall these without new handouts.  Consulted and Agree with Plan of Care Patient         Patient will benefit from skilled therapeutic intervention in order to  improve the following deficits and impairments:  Abnormal gait, Decreased activity tolerance, Decreased balance, Decreased mobility, Decreased endurance, Decreased coordination, Decreased strength, Difficulty walking, Impaired sensation, Impaired tone, Postural dysfunction, Pain  Visit Diagnosis: Other abnormalities of gait and mobility  Unsteadiness on feet  Difficulty in walking, not elsewhere classified  Muscle weakness (generalized)  Other disturbances of skin sensation     Problem List Patient Active Problem List   Diagnosis Date Noted   Incomplete paraplegia ([place]) 11/08/2019   Foot drop, bilateral 11/08/2019   Spasticity 08/18/2019   Chronic pain syndrome 08/18/2019   Nerve pain 08/18/2019   Clonus 07/21/2019   Spinal cord tumor    Pain    Transaminitis    Leukocytosis    Neurogenic bowel    Postoperative pain    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, [place], [place] [address] [phone] 07/19/20, 1:06 PM   Name: Jacob Lambert MRN: [id number] Date of Birth: 02/15/1985

## 2020-07-23 ENCOUNTER — Other Ambulatory Visit: Payer: Self-pay

## 2020-07-23 ENCOUNTER — Encounter: Payer: Self-pay | Admitting: Physical Therapy

## 2020-07-23 ENCOUNTER — Ambulatory Visit: Payer: BC Managed Care – PPO | Admitting: Physical Therapy

## 2020-07-23 DIAGNOSIS — R262 Difficulty in walking, not elsewhere classified: Secondary | ICD-10-CM

## 2020-07-23 DIAGNOSIS — R2689 Other abnormalities of gait and mobility: Secondary | ICD-10-CM

## 2020-07-23 DIAGNOSIS — R208 Other disturbances of skin sensation: Secondary | ICD-10-CM | POA: Diagnosis not present

## 2020-07-23 DIAGNOSIS — R278 Other lack of coordination: Secondary | ICD-10-CM | POA: Diagnosis not present

## 2020-07-23 DIAGNOSIS — M6281 Muscle weakness (generalized): Secondary | ICD-10-CM | POA: Diagnosis not present

## 2020-07-23 DIAGNOSIS — R2681 Unsteadiness on feet: Secondary | ICD-10-CM

## 2020-07-23 NOTE — Therapy (Signed)
[facility] [address] [place], [place], [address] Phone: [phone]   Fax:  [phone]  Physical Therapy Treatment  Patient Details  Name: Jacob Lambert MRN: [id number] Date of Birth: [birth year] Referring Provider (PT): Dr. Genice Rouge   Encounter Date: 07/23/2020   PT End of Session - 07/23/20 1539     Visit Number 61    Number of Visits 70    Date for PT Re-Evaluation 07/29/20    Authorization Type BCBS - VL: 30 PT/OT combined    Authorization - Visit Number 33   pt aware of OOP after visit max reached   Authorization - Number of Visits 30    PT Start Time 1533    PT Stop Time 1615    PT Time Calculation (min) 42 min    Equipment Utilized During Treatment Other (comment)   bil GRAFO's   Activity Tolerance Patient tolerated treatment well    Behavior During Therapy WFL for tasks assessed/performed             Past Medical History:  Diagnosis Date   Anxiety    Clonus    Depression    Headache    Pneumonia    "walking"   Quadriplegia and quadriparesis ([facility])    Spasticity    Spinal cord tumor    Wheelchair bound     Past Surgical History:  Procedure Laterality Date   LAMINECTOMY N/A 06/01/2019   Procedure: Cervical six-seven Laminoplasty, biopsy and resection of intramedullary spinal cord tumor;  Surgeon: Jadene Pierini, MD;  Location: [facility];  Service: Neurosurgery;  Laterality: N/A;   NO PAST SURGERIES      There were no vitals filed for this visit.   Subjective Assessment - 07/23/20 1534     Subjective No new complaints. Getting ready for move at end of August to [place], along with multiple travel trips for work. Having the eye surgery mid August and will miss therapy for about 2 weeks afterwards. No falls. Having increased pain today in back and both legs, stayed in bed most of the day. Took some pain meds to help with this. Pt unsure why pain increased today, unless due to being  active over weekend sorting things to pack/get rid of for move.    Limitations Standing;Walking;House hold activities    How long can you sit comfortably? no issues    How long can you stand comfortably? <5 min    How long can you walk comfortably? <0.25 mile    Patient Stated Goals Progress walking    Currently in Pain? Yes    Pain Score 6     Pain Location Generalized    Pain Orientation Lower;Right;Left   lower back, bil LE"s left>right   Pain Descriptors / Indicators Burning;Aching   aching lower back, "pinching" at incision   Pain Type Chronic pain;Neuropathic pain    Pain Onset More than a month ago    Pain Frequency Intermittent    Aggravating Factors  increased activity    Pain Relieving Factors pain meds                      [facility] Adult PT Treatment/Exercise - 07/23/20 1543       Transfers   Transfers Sit to Stand;Stand to Sit    Sit to Stand 6: Modified independent (Device/Increase time)    Stand to Sit 6: Modified independent (Device/Increase time)      Ambulation/Gait  Ambulation/Gait Yes    Ambulation/Gait Assistance 5: Supervision    Ambulation/Gait Assistance Details pt used rollator enter/exit session. Use of straight cane in session.    Ambulation Distance (Feet) --   around clinic with session   Assistive device Rollator;Straight cane    Gait Pattern Step-through pattern;Lateral hip instability;Narrow base of support    Ambulation Surface Level;Indoor    Stairs Yes    Stairs Assistance 5: Supervision    Stairs Assistance Details (indicate cue type and reason) with bil GRAFO's working increased reps before fatigue with rail/cane combo in prep for move to [facility] and second floor apartment (has elevator, needs to be prepared for emergency if power goes out/elevator breaks down).    Stair Management Technique One rail Right;Step to pattern;Forwards;With cane;Other (comment)    Number of Stairs 4   x5 reps   Height of Stairs 6      Knee/Hip  Exercises: Aerobic   Elliptical concurrent wtih bil GRAFO's level 1.0 x 4 minutes with UE support, cues for posture and to decrease UE support as able.                 Balance Exercises - 07/23/20 1557       Balance Exercises: Standing   Other Standing Exercises on BOSU blue side up: with UE support alternating marching with contralateral march for 10 reps each side, min guard assist for safety; On inverted BOSU: with bil UE support for single leg stance in center with contralateral LE kids fwd>lateral>bwd for 2 sets of 5 reps on each side. min guard assist for balance/safety.                 PT Short Term Goals - 06/27/20 1700       PT SHORT TERM GOAL #1   Title Pt will improve functional aerobic endurance to perform elliptical x 5 minutes with Bioness in cycle mode without leaning heavily on UE for support    Baseline 06/27/20: met time component with heavy UE reliance for last 2.5 minutes    Status Partially Met    Target Date 06/29/20               PT Long Term Goals - 06/06/20 1455       PT LONG TERM GOAL #1   Title Patient will demo >54/56 on berg balance scale wearing bilat GRAFO's to improve functional balance and reduce fall risk  (ALL LTG DUE BY 07/29/20)    Baseline 52/56    Time 8    Period Weeks    Status Revised      PT LONG TERM GOAL #2   Title Patient will be able to ambulate >2000 feet outside with cane and bilat GRAFO MOD I to improve short distance community ambulation.    Baseline 2000 with cane and GRAFO, close supervision    Time 8    Period Weeks    Status Revised      PT LONG TERM GOAL #4   Title Pt will demonstrate ability to negotiate curb, ramp and 12 stairs with cane, bilat GRAFO's safely Mod I    Baseline Performs with supervision with cane    Time 8    Period Weeks    Status Revised                   Plan - 07/23/20 1543     Clinical Impression Statement Today's skilled session continued to focus on  strengthening and activity  tolerance with no issues noted or reported in session. The ptis progressing toward goals and should benefit from continued PT to progress toward unmet goals.    Personal Factors and Comorbidities Age;Past/Current Experience;Comorbidity 2;Time since onset of injury/illness/exacerbation;Transportation;Comorbidity 1    Rehab Potential Good    PT Frequency 2x / week    PT Duration 8 weeks    PT Treatment/Interventions ADLs/Self Care Home Management;Gait training;Stair training;Functional mobility training;Therapeutic activities;Therapeutic exercise;Balance training;Neuromuscular re-education;Patient/family education;Passive range of motion;Energy conservation;Electrical Stimulation;DME Instruction;Orthotic Fit/Training;Aquatic Therapy    PT Next Visit Plan Aquatic PT: core training, glute med training, standing balance; Land based PT: work on gait on inclines-treadmill uphill and downhill 5-6% grade, full flight of stairs with cane and R rail.  Problem solve how to pick up heavier items from floor with bilat GRAFO's on.   Glute med strengthening sidelying and physioball, balance training for tandem and SLS balances.   Endurance with Elliptical    PT Home Exercise Plan Access Code: [email or web address]; [email or web address]. plus heel raises, and HS curls verbally added. Pt stated able to recall these without new handouts.             Patient will benefit from skilled therapeutic intervention in order to improve the following deficits and impairments:  Abnormal gait, Decreased activity tolerance, Decreased balance, Decreased mobility, Decreased endurance, Decreased coordination, Decreased strength, Difficulty walking, Impaired sensation, Impaired tone, Postural dysfunction, Pain  Visit Diagnosis: Other abnormalities of gait and mobility  Other lack of coordination  Unsteadiness on feet  Difficulty in walking, not elsewhere classified  Muscle weakness (generalized)     Problem  List Patient Active Problem List   Diagnosis Date Noted   Incomplete paraplegia ([place]) 11/08/2019   Foot drop, bilateral 11/08/2019   Spasticity 08/18/2019   Chronic pain syndrome 08/18/2019   Nerve pain 08/18/2019   Clonus 07/21/2019   Spinal cord tumor    Pain    Transaminitis    Leukocytosis    Neurogenic bowel    Postoperative pain    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 [address] [place], [place] [address] [phone] 07/23/20, 7:25 PM   Name: Jacob Lambert MRN: [id number] Date of Birth: 1985/12/16

## 2020-07-25 DIAGNOSIS — N411 Chronic prostatitis: Secondary | ICD-10-CM | POA: Diagnosis not present

## 2020-07-25 DIAGNOSIS — R102 Pelvic and perineal pain: Secondary | ICD-10-CM | POA: Diagnosis not present

## 2020-07-25 DIAGNOSIS — N5312 Painful ejaculation: Secondary | ICD-10-CM | POA: Diagnosis not present

## 2020-07-25 DIAGNOSIS — R300 Dysuria: Secondary | ICD-10-CM | POA: Diagnosis not present

## 2020-07-26 ENCOUNTER — Ambulatory Visit: Payer: BC Managed Care – PPO | Admitting: Physical Therapy

## 2020-07-26 ENCOUNTER — Ambulatory Visit (HOSPITAL_BASED_OUTPATIENT_CLINIC_OR_DEPARTMENT_OTHER): Payer: BC Managed Care – PPO | Admitting: Physical Therapy

## 2020-07-26 ENCOUNTER — Other Ambulatory Visit: Payer: Self-pay

## 2020-07-26 ENCOUNTER — Encounter (HOSPITAL_BASED_OUTPATIENT_CLINIC_OR_DEPARTMENT_OTHER): Payer: Self-pay | Admitting: Physical Therapy

## 2020-07-26 DIAGNOSIS — R293 Abnormal posture: Secondary | ICD-10-CM | POA: Diagnosis not present

## 2020-07-26 DIAGNOSIS — R262 Difficulty in walking, not elsewhere classified: Secondary | ICD-10-CM

## 2020-07-26 DIAGNOSIS — D497 Neoplasm of unspecified behavior of endocrine glands and other parts of nervous system: Secondary | ICD-10-CM | POA: Diagnosis not present

## 2020-07-26 DIAGNOSIS — R2681 Unsteadiness on feet: Secondary | ICD-10-CM | POA: Diagnosis not present

## 2020-07-26 DIAGNOSIS — R52 Pain, unspecified: Secondary | ICD-10-CM | POA: Diagnosis not present

## 2020-07-26 DIAGNOSIS — M6281 Muscle weakness (generalized): Secondary | ICD-10-CM | POA: Diagnosis not present

## 2020-07-26 DIAGNOSIS — R208 Other disturbances of skin sensation: Secondary | ICD-10-CM | POA: Diagnosis not present

## 2020-07-26 DIAGNOSIS — R2689 Other abnormalities of gait and mobility: Secondary | ICD-10-CM | POA: Diagnosis not present

## 2020-07-26 DIAGNOSIS — R41842 Visuospatial deficit: Secondary | ICD-10-CM | POA: Diagnosis not present

## 2020-07-26 DIAGNOSIS — R278 Other lack of coordination: Secondary | ICD-10-CM

## 2020-07-26 NOTE — Therapy (Signed)
[facility] [place], [place], [address] Phone: [phone]   Fax:  [phone]  Physical Therapy Treatment  Patient Details  Name: Jacob Lambert MRN: [id number] Date of Birth: 08-Nov-1985 Referring Provider (PT): Dr. Courtney Heys   Encounter Date: 07/26/2020   PT End of Session - 07/26/20 1331     Visit Number 39    Number of Visits 70    Date for PT Re-Evaluation 07/29/20    Authorization Type BCBS - VL: 30 PT/OT combined    PT Start Time 1118    PT Stop Time 1205    PT Time Calculation (min) 47 min    Equipment Utilized During Treatment Other (comment)    Activity Tolerance Patient tolerated treatment well    Behavior During Therapy WFL for tasks assessed/performed             Past Medical History:  Diagnosis Date   Anxiety    Clonus    Depression    Headache    Pneumonia    "walking"   Quadriplegia and quadriparesis ([place])    Spasticity    Spinal cord tumor    Wheelchair bound     Past Surgical History:  Procedure Laterality Date   LAMINECTOMY N/A 06/01/2019   Procedure: Cervical six-seven Laminoplasty, biopsy and resection of intramedullary spinal cord tumor;  Surgeon: [name], MD;  Location: [facility];  Service: Neurosurgery;  Laterality: N/A;   NO PAST SURGERIES      There were no vitals filed for this visit.   Subjective Assessment - 07/26/20 1330     Subjective No complaints or concerns. Doing well.    Pain Score 6                                          PT Short Term Goals - 06/27/20 1700       PT SHORT TERM GOAL #1   Title Pt will improve functional aerobic endurance to perform elliptical x 5 minutes with Bioness in cycle mode without leaning heavily on UE for support    Baseline 06/27/20: met time component with heavy UE reliance for last 2.5 minutes    Status Partially Met    Target Date 06/29/20               PT Long Term  Goals - 06/06/20 1455       PT LONG TERM GOAL #1   Title Patient will demo >54/56 on berg balance scale wearing bilat GRAFO's to improve functional balance and reduce fall risk  (ALL LTG DUE BY 07/29/20)    Baseline 52/56    Time 8    Period Weeks    Status Revised      PT LONG TERM GOAL #2   Title Patient will be able to ambulate >2000 feet outside with cane and bilat GRAFO MOD I to improve short distance community ambulation.    Baseline 2000 with cane and GRAFO, close supervision    Time 8    Period Weeks    Status Revised      PT LONG TERM GOAL #4   Title Pt will demonstrate ability to negotiate curb, ramp and 12 stairs with cane, bilat GRAFO's safely Mod I    Baseline Performs with supervision with cane    Time 8    Period Weeks  Status Revised            Pt seen for aquatic therapy today.  Treatment took place in water 3.25-4.8 ft in depth at the [facility] pool. Temp of water was 91.  Pt entered/exited the pool via stairs step to pattern independently with bilat rail.   Warm upand cool down: forward, backward and side stepping/walking cues for increased step length without support for balance x 3 lengths   Seated Stretching: Hamstrings and gastroc  Standing Hip flex, extension 2 x 10 Abd/add 2 x 10 rep Marching 2 x 10  Kick board push downs 2 x 10 reps Leaning on wall kick board rotations right and left x 10 reps. Cueing for speed and core tightness Balance challenges: standing on noodle gaining balance with heels supported, then toes.  One gained moving form heel to toes x 5, challenge to complete 5 revolutions without LOB.  (Pt unable to complete)  Vertical suspension Sitting on noodle: cuing for static balance, once achieved pt completes head movement in all directions     Pt requires buoyancy for support and to offload joints with strengthening exercises. Viscosity of the water is needed for resistance of strengthening; water current  perturbations provides challenge to standing balance unsupported, requiring increased core activation.         Plan - 07/26/20 1333     Clinical Impression Statement Pt able to amb in water without ue support of noodle.  He is directed through strengtheing and balance challenges with focus on core.  Able to add core rotation resisted exercises as well as isomentric core exercises with static and dynamic balance challenges. Pt wih no reports of increased discomfort during session.  Pt with excellent effort.    Personal Factors and Comorbidities Age;Past/Current Experience;Comorbidity 2;Time since onset of injury/illness/exacerbation;Transportation;Comorbidity 1    Examination-Activity Limitations Bathing;Lift;Squat;Stairs;Stand;Transfers    Stability/Clinical Decision Making Evolving/Moderate complexity    Rehab Potential Good    PT Frequency 2x / week    PT Duration 8 weeks    PT Treatment/Interventions ADLs/Self Care Home Management;Gait training;Stair training;Functional mobility training;Therapeutic activities;Therapeutic exercise;Balance training;Neuromuscular re-education;Patient/family education;Passive range of motion;Energy conservation;Electrical Stimulation;DME Instruction;Orthotic Fit/Training;Aquatic Therapy    PT Next Visit Plan Progress balance challenges for core strength    PT Home Exercise Plan Access Code: [email or web address]; [email or web address]. plus heel raises, and HS curls verbally added. Pt stated able to recall these without new handouts.             Patient will benefit from skilled therapeutic intervention in order to improve the following deficits and impairments:  Abnormal gait, Decreased activity tolerance, Decreased balance, Decreased mobility, Decreased endurance, Decreased coordination, Decreased strength, Difficulty walking, Impaired sensation, Impaired tone, Postural dysfunction, Pain  Visit Diagnosis: Other abnormalities of gait and mobility  Other disturbances of skin  sensation  Muscle weakness (generalized)  Other lack of coordination  Abnormal posture  Pain  Unsteadiness on feet  Difficulty in walking, not elsewhere classified     Problem List Patient Active Problem List   Diagnosis Date Noted   Incomplete paraplegia ([place]) 11/08/2019   Foot drop, bilateral 11/08/2019   Spasticity 08/18/2019   Chronic pain syndrome 08/18/2019   Nerve pain 08/18/2019   Clonus 07/21/2019   Spinal cord tumor    Pain    Transaminitis    Leukocytosis    Neurogenic bowel    Postoperative pain    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

## 2020-07-31 ENCOUNTER — Other Ambulatory Visit: Payer: Self-pay

## 2020-07-31 ENCOUNTER — Ambulatory Visit: Payer: BC Managed Care – PPO | Admitting: Physical Therapy

## 2020-07-31 DIAGNOSIS — R278 Other lack of coordination: Secondary | ICD-10-CM | POA: Diagnosis not present

## 2020-07-31 DIAGNOSIS — R262 Difficulty in walking, not elsewhere classified: Secondary | ICD-10-CM | POA: Diagnosis not present

## 2020-07-31 DIAGNOSIS — R2689 Other abnormalities of gait and mobility: Secondary | ICD-10-CM

## 2020-07-31 DIAGNOSIS — R208 Other disturbances of skin sensation: Secondary | ICD-10-CM

## 2020-07-31 DIAGNOSIS — R2681 Unsteadiness on feet: Secondary | ICD-10-CM | POA: Diagnosis not present

## 2020-07-31 DIAGNOSIS — M6281 Muscle weakness (generalized): Secondary | ICD-10-CM | POA: Diagnosis not present

## 2020-07-31 NOTE — Therapy (Addendum)
[facility] [address] [place], [place], [address] Phone: [phone]   Fax:  [phone]  Physical Therapy Treatment  Patient Details  Name: Jacob Lambert MRN: [id number] Date of Birth: 04-21-1985 Referring Provider (PT): Dr. Courtney Heys   Encounter Date: 07/31/2020   PT End of Session - 07/31/20 2153     Visit Number 34    Number of Visits 82   Date for PT Re-Evaluation 08/30/20    Authorization Type BCBS - VL: 30 PT/OT combined- pt aware of self pay after visit limit reached. keeping track of overage visits to assist with pt keeping track    Authorization - Visit Number 35    Authorization - Number of Visits 30    PT Start Time 1532    PT Stop Time 1615    PT Time Calculation (min) 43 min    Equipment Utilized During Treatment Other (comment)    Activity Tolerance Patient tolerated treatment well    Behavior During Therapy WFL for tasks assessed/performed             Past Medical History:  Diagnosis Date   Anxiety    Clonus    Depression    Headache    Pneumonia    "walking"   Quadriplegia and quadriparesis ([place])    Spasticity    Spinal cord tumor    Wheelchair bound     Past Surgical History:  Procedure Laterality Date   LAMINECTOMY N/A 06/01/2019   Procedure: Cervical six-seven Laminoplasty, biopsy and resection of intramedullary spinal cord tumor;  Surgeon: [name], MD;  Location: [facility];  Service: Neurosurgery;  Laterality: N/A;   NO PAST SURGERIES      There were no vitals filed for this visit.   Subjective Assessment - 07/31/20 1538     Subjective No new complaints. No falls and pain "all right" today.    Limitations Standing;Walking;House hold activities    How long can you sit comfortably? no issues    How long can you stand comfortably? <5 min    How long can you walk comfortably? <0.25 mile    Patient Stated Goals Progress walking    Currently in Pain? Yes     Pain Location Generalized    Pain Orientation Right;Left;Lower   lower back, left>right LE's   Pain Descriptors / Indicators Burning;Aching    Pain Type Chronic pain;Neuropathic pain    Pain Onset More than a month ago    Pain Frequency Intermittent    Aggravating Factors  increased activity    Pain Relieving Factors pain meds                     [facility] Adult PT Treatment/Exercise - 07/31/20 1544       Transfers   Transfers Sit to Stand;Stand to Sit    Sit to Stand 6: Modified independent (Device/Increase time)    Stand to Sit 6: Modified independent (Device/Increase time)      Ambulation/Gait   Ambulation/Gait Yes    Ambulation/Gait Assistance 5: Supervision    Ambulation/Gait Assistance Details used rollator to enter/exit session. cane in session    Assistive device Rollator;Straight cane    Gait Pattern Step-through pattern;Lateral hip instability;Narrow base of support    Ambulation Surface Level;Indoor    Stairs Yes    Stairs Assistance 5: Supervision    Stairs Assistance Details (indicate cue type and reason) with bil GRAFO's with rail/cane combo working on controlled  descent with proper foot placemet on step with descent.    Stair Management Technique One rail Right;One rail Left;Step to pattern;Forwards;With cane    Number of Stairs 4   x6 reps   Height of Stairs 6      Therapeutic Activites    Therapeutic Activities Lifting    Lifting working on lifitng from floor>turning and walking ~4-5 feet to set crate on mat table>lifting from table and turning to walk about 4-5 feet to set crate back on floor. performed for 5 reps with 5#weight, then 3 reps with 10# weights, with reminder cues on stance position, min guard assist.      Knee/Hip Exercises: Aerobic   Tread Mill 1.6 mph with bil UE support: forwad facing with inclines at 4%>6%>2% for ~2 minutes each to simulate walking uphill. knee buckling x3 occuring after each time the incline position was changed with  min guard assist for safety/pt self catching balance/ then had pt turn around and had treadmill strap working in reverse to simulate walking down hill with inclines at 2%>3%>% for 1-2 minutes each. knee buckling x 2 episodes with pt self correcting.                      PT Short Term Goals - 06/27/20 1700       PT SHORT TERM GOAL #1   Title Pt will improve functional aerobic endurance to perform elliptical x 5 minutes with Bioness in cycle mode without leaning heavily on UE for support    Baseline 06/27/20: met time component with heavy UE reliance for last 2.5 minutes    Status Partially Met    Target Date 06/29/20               PT Long Term Goals - 07/31/20 2202       PT LONG TERM GOAL #1   Title Patient will demo >54/56 on berg balance scale wearing bilat GRAFO's to improve functional balance and reduce fall risk  (ALL LTG DUE BY 07/29/20)    Baseline 52/56    Time 8    Period Weeks    Status On-going      PT LONG TERM GOAL #2   Title Patient will be able to ambulate >2000 feet outside with cane and bilat GRAFO MOD I to improve short distance community ambulation.    Baseline 2000 with cane and GRAFO, close supervision    Time 8    Period Weeks    Status On-going      PT LONG TERM GOAL #3   Status On-going      PT LONG TERM GOAL #4   Title Pt will demonstrate ability to negotiate curb, ramp and 12 stairs with cane, bilat GRAFO's safely Mod I    Baseline 07/31/20: continues to need up to supervision with curb/ramp. progressing with stairs    Status Not Met             New goals for recert:  PT Long Term Goals - 07/31/20 2202       PT LONG TERM GOAL #1   Title Patient will demo >54/56 on berg balance scale wearing bilat GRAFO's to improve functional balance and reduce fall risk    Baseline 52/56    Time 4    Period Weeks    Status Revised    Target Date 08/30/20      PT LONG TERM GOAL #2   Title Patient will be able to ambulate >  2000 feet  outside with cane and bilat GRAFO MOD I to improve short distance community ambulation.    Baseline 2000 with cane and GRAFO, close supervision    Time 4    Period Weeks    Status Revised    Target Date 08/30/20      PT LONG TERM GOAL #3   Status --      PT LONG TERM GOAL #4   Title Pt will demonstrate ability to negotiate curb, ramp and 12 stairs with cane, bilat GRAFO's safely Mod I    Baseline 07/31/20: continues to need up to supervision with curb/ramp. progressing with stairs    Time 4    Period Weeks    Status Revised    Target Date 08/30/20                  Plan - 07/31/20 2155     Clinical Impression Statement Today's skilled session continued to focus on use of treadmill to work on pt's endurance with walking up/down hills, stairs with emphasis on repetitive reps for acitivty tolerance and lifting/carrying items in prep for upcoming move. Pt with reports of fatigue at end of session, no other issues noted or reported in session. The pt is planning to continue in aquatics only up until his next eye surgery, then will have one land and one aquatic appt before his move to [facility] when he is cleared to return at end of August. The pt is progressing and should benefit from continued PT to progress toward unmet goals.    Personal Factors and Comorbidities Age;Past/Current Experience;Comorbidity 2;Time since onset of injury/illness/exacerbation;Transportation;Comorbidity 1    Examination-Activity Limitations Bathing;Lift;Squat;Stairs;Stand;Transfers    Stability/Clinical Decision Making Evolving/Moderate complexity    Rehab Potential Good    PT Frequency 2x / week    PT Duration 4 weeks    PT Treatment/Interventions ADLs/Self Care Home Management;Gait training;Stair training;Functional mobility training;Therapeutic activities;Therapeutic exercise;Balance training;Neuromuscular re-education;Patient/family education;Passive range of motion;Energy conservation;Electrical  Stimulation;DME Instruction;Orthotic Fit/Training;Aquatic Therapy    PT Next Visit Plan Aquatic PT: core training, glute med training, standing balance; Land based PT: work on gait on inclines-treadmill uphill and downhill 5-6% grade, full flight of stairs with cane and R rail.  Problem solve how to pick up heavier items from floor with bilat GRAFO's on.   Glute med strengthening sidelying and physioball, balance training for tandem and SLS balances.   Endurance with Elliptical    PT Home Exercise Plan Access Code: [email or web address]; [email or web address]. plus heel raises, and HS curls verbally added. Pt stated able to recall these without new handouts.             Patient will benefit from skilled therapeutic intervention in order to improve the following deficits and impairments:  Abnormal gait, Decreased activity tolerance, Decreased balance, Decreased mobility, Decreased endurance, Decreased coordination, Decreased strength, Difficulty walking, Impaired sensation, Impaired tone, Postural dysfunction, Pain  Visit Diagnosis: Other abnormalities of gait and mobility  Muscle weakness (generalized)  Unsteadiness on feet  Difficulty in walking, not elsewhere classified     Problem List Patient Active Problem List   Diagnosis Date Noted   Incomplete paraplegia ([place]) 11/08/2019   Foot drop, bilateral 11/08/2019   Spasticity 08/18/2019   Chronic pain syndrome 08/18/2019   Nerve pain 08/18/2019   Clonus 07/21/2019   Spinal cord tumor    Pain    Transaminitis    Leukocytosis    Neurogenic bowel    Postoperative pain    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 [place], [place], [address] Phone: [phone]   Fax:  [phone]  Name: Jacob Lambert MRN: [id number] Date of Birth: 1985-09-20

## 2020-08-02 ENCOUNTER — Ambulatory Visit: Payer: BC Managed Care – PPO | Admitting: Physical Therapy

## 2020-08-02 ENCOUNTER — Ambulatory Visit (HOSPITAL_BASED_OUTPATIENT_CLINIC_OR_DEPARTMENT_OTHER): Payer: BC Managed Care – PPO | Admitting: Physical Therapy

## 2020-08-02 ENCOUNTER — Other Ambulatory Visit: Payer: Self-pay

## 2020-08-02 ENCOUNTER — Encounter (HOSPITAL_BASED_OUTPATIENT_CLINIC_OR_DEPARTMENT_OTHER): Payer: Self-pay | Admitting: Physical Therapy

## 2020-08-02 DIAGNOSIS — R262 Difficulty in walking, not elsewhere classified: Secondary | ICD-10-CM

## 2020-08-02 DIAGNOSIS — M6281 Muscle weakness (generalized): Secondary | ICD-10-CM | POA: Diagnosis not present

## 2020-08-02 DIAGNOSIS — R52 Pain, unspecified: Secondary | ICD-10-CM | POA: Diagnosis not present

## 2020-08-02 DIAGNOSIS — R2681 Unsteadiness on feet: Secondary | ICD-10-CM

## 2020-08-02 DIAGNOSIS — R2689 Other abnormalities of gait and mobility: Secondary | ICD-10-CM | POA: Diagnosis not present

## 2020-08-02 DIAGNOSIS — R41842 Visuospatial deficit: Secondary | ICD-10-CM | POA: Diagnosis not present

## 2020-08-02 DIAGNOSIS — D497 Neoplasm of unspecified behavior of endocrine glands and other parts of nervous system: Secondary | ICD-10-CM | POA: Diagnosis not present

## 2020-08-02 DIAGNOSIS — R293 Abnormal posture: Secondary | ICD-10-CM | POA: Diagnosis not present

## 2020-08-02 DIAGNOSIS — R278 Other lack of coordination: Secondary | ICD-10-CM | POA: Diagnosis not present

## 2020-08-02 DIAGNOSIS — R208 Other disturbances of skin sensation: Secondary | ICD-10-CM | POA: Diagnosis not present

## 2020-08-02 NOTE — Therapy (Signed)
[place], [place], [address] Phone: [phone]   Fax:  [phone]  Physical Therapy Treatment  Patient Details  Name: Jacob Lambert MRN: [id number] Date of Birth: [birth year] Referring Provider (PT): Dr. Courtney Heys   Encounter Date: 08/02/2020   PT End of Session - 08/02/20 1442     Visit Number 29    Number of Visits 66    Date for PT Re-Evaluation 07/29/20    Authorization Type BCBS - VL: 30 PT/OT combined- pt aware of self pay after visit limit reached. keeping track of overage visits to assist with pt keeping track    PT Start Time 1345    PT Stop Time 1426    PT Time Calculation (min) 41 min    Activity Tolerance Patient tolerated treatment well    Behavior During Therapy [facility] for tasks assessed/performed             Past Medical History:  Diagnosis Date   Anxiety    Clonus    Depression    Headache    Pneumonia    "walking"   Quadriplegia and quadriparesis ([place])    Spasticity    Spinal cord tumor    Wheelchair bound     Past Surgical History:  Procedure Laterality Date   LAMINECTOMY N/A 06/01/2019   Procedure: Cervical six-seven Laminoplasty, biopsy and resection of intramedullary spinal cord tumor;  Surgeon: [name], MD;  Location: [facility];  Service: Neurosurgery;  Laterality: N/A;   NO PAST SURGERIES      There were no vitals filed for this visit.   Subjective Assessment - 08/02/20 1441     Subjective Patient has no complaints other then his pain he ussualy has.    Currently in Pain? Yes    Pain Score 6     Pain Location Generalized    Pain Orientation Right;Left;Lower    Pain Descriptors / Indicators Aching    Pain Type Chronic pain    Pain Onset More than a month ago    Pain Frequency Intermittent    Aggravating Factors  increased activity    Pain Relieving Factors pain meds    Multiple Pain Sites No                     Pt seen for  aquatic therapy today.  Treatment took place in water 3.25-4.8 ft in depth at the [facility] pool. Temp of water was 91.  Pt entered/exited the pool via stairs step to pattern independently with bilat rail.   Warm upand cool down: forward, backward and side stepping/walking cues for increased step length without support for balance x 3 lengths   Seated Stretching: Hamstrings and gastroc   Standing Hip flex, extension 2 x 10 3lbsweights  Abd/add 2 x 10 rep 3lb weights  Marching 2 x 10 3lb weights Kick board push downs 2 x 10 reps Leaning on wall kick board rotations right and left x 10 reps. Cueing for speed and core tightness Balance challenges: standing on noodle gaining balance with heels supported, then toes.  tandem stance 2x30 sec reach leg CGA -> mod a for balance; narrow base eyes opened and closed    Walking with hip opening with noodle 4 laps; side step with noodle 4 laps; marching with noodle 4 laps      Pt requires buoyancy for support and to offload joints with strengthening exercises. Viscosity of the water is needed  for resistance of strengthening; water current perturbations provides challenge to standing balance unsupported, requiring increased core activation.                   PT Education - 08/02/20 1441     Education Details HEP and symptom management    Person(s) Educated Patient    Methods Explanation;Demonstration;Tactile cues;Verbal cues    Comprehension Verbalized understanding;Returned demonstration;Verbal cues required              PT Short Term Goals - 06/27/20 1700       PT SHORT TERM GOAL #1   Title Pt will improve functional aerobic endurance to perform elliptical x 5 minutes with Bioness in cycle mode without leaning heavily on UE for support    Baseline 06/27/20: met time component with heavy UE reliance for last 2.5 minutes    Status Partially Met    Target Date 06/29/20               PT Long Term Goals -  07/31/20 2202       PT LONG TERM GOAL #1   Title Patient will demo >54/56 on berg balance scale wearing bilat GRAFO's to improve functional balance and reduce fall risk  (ALL LTG DUE BY 07/29/20)    Baseline 52/56    Time 8    Period Weeks    Status On-going      PT LONG TERM GOAL #2   Title Patient will be able to ambulate >2000 feet outside with cane and bilat GRAFO MOD I to improve short distance community ambulation.    Baseline 2000 with cane and GRAFO, close supervision    Time 8    Period Weeks    Status On-going      PT LONG TERM GOAL #3   Status On-going      PT LONG TERM GOAL #4   Title Pt will demonstrate ability to negotiate curb, ramp and 12 stairs with cane, bilat GRAFO's safely Mod I    Baseline 07/31/20: continues to need up to supervision with curb/ramp. progressing with stairs    Status Not Met                   Plan - 08/02/20 1442     Clinical Impression Statement Patient tolerated treamtnet well. He was able to complete standing exercises with weights. He also tolerated balance exercises well. He reported pain at times but nothing out of the ordinary. He required some assistance for balance with balance exercises but other wise tolerated treatment well.    Personal Factors and Comorbidities Age;Past/Current Experience;Comorbidity 2;Time since onset of injury/illness/exacerbation;Transportation;Comorbidity 1    Examination-Activity Limitations Bathing;Lift;Squat;Stairs;Stand;Transfers    Examination-Participation Restrictions Cleaning;Community Activity;Driving;Laundry;Occupation;Shop;Yard Work    [employer]chant [employer] officer Evolving/Moderate complexity    Clinical Decision Making Moderate    Rehab Potential Good    PT Frequency 2x / week    PT Duration 8 weeks    PT Treatment/Interventions ADLs/Self Care Home Management;Gait training;Stair training;Functional mobility training;Therapeutic activities;Therapeutic exercise;Balance  training;Neuromuscular re-education;Patient/family education;Passive range of motion;Energy conservation;Electrical Stimulation;DME Instruction;Orthotic Fit/Training;Aquatic Therapy    PT Next Visit Plan Aquatic PT: core training, glute med training, standing balance; Land based PT: work on gait on inclines-treadmill uphill and downhill 5-6% grade, full flight of stairs with cane and R rail.  Problem solve how to pick up heavier items from floor with bilat GRAFO's on.   Glute med strengthening sidelying and physioball, balance training for tandem and SLS  balances.   Endurance with Elliptical    PT Home Exercise Plan Access Code: [email or web address]; [email or web address]. plus heel raises, and HS curls verbally added. Pt stated able to recall these without new handouts.    Consulted and Agree with Plan of Care Patient             Patient will benefit from skilled therapeutic intervention in order to improve the following deficits and impairments:  Abnormal gait, Decreased activity tolerance, Decreased balance, Decreased mobility, Decreased endurance, Decreased coordination, Decreased strength, Difficulty walking, Impaired sensation, Impaired tone, Postural dysfunction, Pain  Visit Diagnosis: Other abnormalities of gait and mobility  Muscle weakness (generalized)  Unsteadiness on feet  Difficulty in walking, not elsewhere classified     Problem List Patient Active Problem List   Diagnosis Date Noted   Incomplete paraplegia ([place]) 11/08/2019   Foot drop, bilateral 11/08/2019   Spasticity 08/18/2019   Chronic pain syndrome 08/18/2019   Nerve pain 08/18/2019   Clonus 07/21/2019   Spinal cord tumor    Pain    Transaminitis    Leukocytosis    Neurogenic bowel    Postoperative pain    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   [facility] [address] [place], [place], [address] Phone: [phone]   Fax:  [phone]  Name: Jacob Lambert MRN: [id number] Date of Birth: 07-Jul-1985

## 2020-08-02 NOTE — Addendum Note (Signed)
Addended by: Rico Junker on: 08/02/2020 04:08 PM   Modules accepted: Orders

## 2020-08-08 DIAGNOSIS — N5312 Painful ejaculation: Secondary | ICD-10-CM | POA: Diagnosis not present

## 2020-08-08 DIAGNOSIS — R262 Difficulty in walking, not elsewhere classified: Secondary | ICD-10-CM | POA: Diagnosis not present

## 2020-08-08 DIAGNOSIS — R102 Pelvic and perineal pain: Secondary | ICD-10-CM | POA: Diagnosis not present

## 2020-08-08 DIAGNOSIS — R300 Dysuria: Secondary | ICD-10-CM | POA: Diagnosis not present

## 2020-08-09 ENCOUNTER — Encounter (HOSPITAL_BASED_OUTPATIENT_CLINIC_OR_DEPARTMENT_OTHER): Payer: Self-pay | Admitting: Physical Therapy

## 2020-08-09 ENCOUNTER — Ambulatory Visit (HOSPITAL_BASED_OUTPATIENT_CLINIC_OR_DEPARTMENT_OTHER): Payer: BC Managed Care – PPO | Attending: Physical Medicine and Rehabilitation | Admitting: Physical Therapy

## 2020-08-09 ENCOUNTER — Other Ambulatory Visit: Payer: Self-pay

## 2020-08-09 DIAGNOSIS — M6281 Muscle weakness (generalized): Secondary | ICD-10-CM | POA: Diagnosis not present

## 2020-08-09 DIAGNOSIS — R2681 Unsteadiness on feet: Secondary | ICD-10-CM | POA: Diagnosis not present

## 2020-08-09 DIAGNOSIS — R293 Abnormal posture: Secondary | ICD-10-CM | POA: Insufficient documentation

## 2020-08-09 DIAGNOSIS — R262 Difficulty in walking, not elsewhere classified: Secondary | ICD-10-CM | POA: Insufficient documentation

## 2020-08-09 DIAGNOSIS — R52 Pain, unspecified: Secondary | ICD-10-CM | POA: Insufficient documentation

## 2020-08-09 DIAGNOSIS — D497 Neoplasm of unspecified behavior of endocrine glands and other parts of nervous system: Secondary | ICD-10-CM | POA: Insufficient documentation

## 2020-08-09 DIAGNOSIS — R208 Other disturbances of skin sensation: Secondary | ICD-10-CM | POA: Diagnosis not present

## 2020-08-09 DIAGNOSIS — R278 Other lack of coordination: Secondary | ICD-10-CM | POA: Insufficient documentation

## 2020-08-09 DIAGNOSIS — R2689 Other abnormalities of gait and mobility: Secondary | ICD-10-CM | POA: Diagnosis not present

## 2020-08-09 DIAGNOSIS — R41842 Visuospatial deficit: Secondary | ICD-10-CM | POA: Insufficient documentation

## 2020-08-09 NOTE — Therapy (Signed)
[facility] [address] [place], [place], [address] Phone: [phone]   Fax:  [phone]  Physical Therapy Treatment  Patient Details  Name: Jacob Lambert MRN: [id number] Date of Birth: 07/01/1985 Referring Provider (PT): Dr. Courtney Heys   Encounter Date: 08/09/2020   PT End of Session - 08/09/20 1449     Visit Number 65    Number of Visits 69    Date for PT Re-Evaluation 08/30/20    Authorization Type BCBS - VL: 30 PT/OT combined- pt aware of self pay after visit limit reached. keeping track of overage visits to assist with pt keeping track    PT Start Time 1345    PT Stop Time 1428    PT Time Calculation (min) 43 min    Equipment Utilized During Treatment Other (comment)   aquatic equipment   Activity Tolerance Patient tolerated treatment well    Behavior During Therapy [facility] for tasks assessed/performed             Past Medical History:  Diagnosis Date   Anxiety    Clonus    Depression    Headache    Pneumonia    "walking"   Quadriplegia and quadriparesis ([place])    Spasticity    Spinal cord tumor    Wheelchair bound     Past Surgical History:  Procedure Laterality Date   LAMINECTOMY N/A 06/01/2019   Procedure: Cervical six-seven Laminoplasty, biopsy and resection of intramedullary spinal cord tumor;  Surgeon: [name], MD;  Location: [facility];  Service: Neurosurgery;  Laterality: N/A;   NO PAST SURGERIES      There were no vitals filed for this visit.   Subjective Assessment - 08/09/20 1446     Subjective Patient states he is sore throughout body due to work and busy with household chores.    Limitations Standing;Walking;House hold activities    How long can you sit comfortably? no issues    How long can you stand comfortably? <5 min    How long can you walk comfortably? <0.25 mile    Currently in Pain? Yes    Pain Score 6     Pain Location Generalized    Pain Orientation  Left;Right;Lower    Pain Descriptors / Indicators Aching    Pain Type Chronic pain    Pain Onset More than a month ago    Pain Frequency Intermittent    Aggravating Factors  increased activity    Pain Relieving Factors pain meds    Multiple Pain Sites No              Pt seen for aquatic therapy today.  Treatment took place in water 3.25-4 ft in depth at the [facility] pool. Temp of water was 91.  Pt entered/exited the pool via stairs (step through pattern) independently with bilat rail.  Warm up: heel/toe walking x4 laps across pool  side stepping x4 laps from   Ambulation with noodle support: long strides, march 2 laps   Exercises; Slow march x20; hip extension x20; hip abduction x20;    board trunk flexion x10 lateral board rotation x10 each way seated   Steps step up x20 each leg; lateral step up x20 each leg;   Balance exercises:  Noodle push and pull x20 with core breathing; noodle press x20 with core breathing  tandem Stance eyes open and eyes closed  3x20 secs Narrow base eyes open and eyes closed 3x20 secs Hamstring stretch 3x20  sec hold  Piriformis stretch 3x20 sec hold   Pt requires buoyancy for support and to offload joints with strengthening exercises. Viscosity of the water is needed for resistance of strengthening; water current perturbations provides challenge to standing balance unsupported, requiring increased core activation.          PT Education - 08/09/20 1449     Education Details Reviewed stretches to complete at home to relief tightness in gluteal and hamstrings.    Person(s) Educated Patient    Methods Demonstration;Explanation;Verbal cues    Comprehension Verbalized understanding;Returned demonstration;Verbal cues required              PT Short Term Goals - 08/02/20 1557       PT SHORT TERM GOAL #1   Title = LTG               PT Long Term Goals - 07/31/20 2202       PT LONG TERM GOAL #1   Title Patient will  demo >54/56 on berg balance scale wearing bilat GRAFO's to improve functional balance and reduce fall risk    Baseline 52/56    Time 4    Period Weeks    Status Revised    Target Date 08/30/20      PT LONG TERM GOAL #2   Title Patient will be able to ambulate >2000 feet outside with cane and bilat GRAFO MOD I to improve short distance community ambulation.    Baseline 2000 with cane and GRAFO, close supervision    Time 4    Period Weeks    Status Revised    Target Date 08/30/20      PT LONG TERM GOAL #3   Status --      PT LONG TERM GOAL #4   Title Pt will demonstrate ability to negotiate curb, ramp and 12 stairs with cane, bilat GRAFO's safely Mod I    Baseline 07/31/20: continues to need up to supervision with curb/ramp. progressing with stairs    Time 4    Period Weeks    Status Revised    Target Date 08/30/20                   Plan - 08/09/20 1452     Clinical Impression Statement Patient tolerated treatmet well. He completed balance exercises with some difficulty but stated he was not in pain. Therapy should conitnue with therapy to imporve strenght, balance, and endurace as tolerated.    Personal Factors and Comorbidities Age;Past/Current Experience;Comorbidity 2;Time since onset of injury/illness/exacerbation;Transportation;Comorbidity 1    Comorbidities muitple joints involved    Examination-Activity Limitations Bathing;Lift;Squat;Stairs;Stand;Transfers    Examination-Participation Restrictions Cleaning;Community Activity;Driving;Laundry;Occupation;Shop;Yard Work    [employer]chant [employer] officer Evolving/Moderate complexity    Clinical Decision Making Moderate    Rehab Potential Good    PT Frequency 2x / week    PT Duration 8 weeks    PT Treatment/Interventions ADLs/Self Care Home Management;Gait training;Stair training;Functional mobility training;Therapeutic activities;Therapeutic exercise;Balance training;Neuromuscular re-education;Patient/family  education;Passive range of motion;Energy conservation;Electrical Stimulation;DME Instruction;Orthotic Fit/Training;Aquatic Therapy    PT Next Visit Plan Aquatic PT: core training, glute med training, standing balance; Land based PT: work on gait on inclines-treadmill uphill and downhill 5-6% grade, full flight of stairs with cane and R rail.  Problem solve how to pick up heavier items from floor with bilat GRAFO's on.   Glute med strengthening sidelying and physioball, balance training for tandem and SLS balances.   Endurance with Elliptical  PT Home Exercise Plan Access Code: [email or web address]. plus heel raises, and HS curls verbally added. Pt stated able to recall these without new handouts.    Consulted and Agree with Plan of Care Patient             Patient will benefit from skilled therapeutic intervention in order to improve the following deficits and impairments:  Abnormal gait, Decreased activity tolerance, Decreased balance, Decreased mobility, Decreased endurance, Decreased coordination, Decreased strength, Difficulty walking, Impaired sensation, Impaired tone, Postural dysfunction, Pain  Visit Diagnosis: Other abnormalities of gait and mobility  Muscle weakness (generalized)  Unsteadiness on feet  Difficulty in walking, not elsewhere classified  Other disturbances of skin sensation     Problem List Patient Active Problem List   Diagnosis Date Noted   Incomplete paraplegia ([place]) 11/08/2019   Foot drop, bilateral 11/08/2019   Spasticity 08/18/2019   Chronic pain syndrome 08/18/2019   Nerve pain 08/18/2019   Clonus 07/21/2019   Spinal cord tumor    Pain    Transaminitis    Leukocytosis    Neurogenic bowel    Postoperative pain    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   [facility] [address] [place], [place], [address] Phone: [phone]   Fax:  [phone]  Name: Asser Dail MRN: [id number] Date of Birth: [birth year]

## 2020-08-14 ENCOUNTER — Ambulatory Visit (HOSPITAL_BASED_OUTPATIENT_CLINIC_OR_DEPARTMENT_OTHER): Payer: BC Managed Care – PPO | Admitting: Physical Therapy

## 2020-08-14 ENCOUNTER — Other Ambulatory Visit: Payer: Self-pay

## 2020-08-14 ENCOUNTER — Encounter (HOSPITAL_BASED_OUTPATIENT_CLINIC_OR_DEPARTMENT_OTHER): Payer: Self-pay | Admitting: Physical Therapy

## 2020-08-14 DIAGNOSIS — R293 Abnormal posture: Secondary | ICD-10-CM

## 2020-08-14 DIAGNOSIS — R41842 Visuospatial deficit: Secondary | ICD-10-CM

## 2020-08-14 DIAGNOSIS — R52 Pain, unspecified: Secondary | ICD-10-CM | POA: Diagnosis not present

## 2020-08-14 DIAGNOSIS — M6281 Muscle weakness (generalized): Secondary | ICD-10-CM | POA: Diagnosis not present

## 2020-08-14 DIAGNOSIS — R278 Other lack of coordination: Secondary | ICD-10-CM | POA: Diagnosis not present

## 2020-08-14 DIAGNOSIS — R2681 Unsteadiness on feet: Secondary | ICD-10-CM | POA: Diagnosis not present

## 2020-08-14 DIAGNOSIS — R2689 Other abnormalities of gait and mobility: Secondary | ICD-10-CM | POA: Diagnosis not present

## 2020-08-14 DIAGNOSIS — R262 Difficulty in walking, not elsewhere classified: Secondary | ICD-10-CM | POA: Diagnosis not present

## 2020-08-14 DIAGNOSIS — R208 Other disturbances of skin sensation: Secondary | ICD-10-CM | POA: Diagnosis not present

## 2020-08-14 DIAGNOSIS — D497 Neoplasm of unspecified behavior of endocrine glands and other parts of nervous system: Secondary | ICD-10-CM

## 2020-08-14 NOTE — Therapy (Signed)
[facility] [place], [place], [address] Phone: [phone]   Fax:  [phone]  Physical Therapy Treatment  Patient Details  Name: Jacob Lambert MRN: [id number] Date of Birth: 1985/06/09 Referring Provider (PT): Dr. Courtney Heys   Encounter Date: 08/14/2020   PT End of Session - 08/14/20 1512     Visit Number 35    Number of Visits 36    Date for PT Re-Evaluation 08/30/20    Authorization Type BCBS - VL: 30 PT/OT combined- pt aware of self pay after visit limit reached. keeping track of overage visits to assist with pt keeping track    PT Start Time 1334    PT Stop Time 1419    PT Time Calculation (min) 45 min             Past Medical History:  Diagnosis Date   Anxiety    Clonus    Depression    Headache    Pneumonia    "walking"   Quadriplegia and quadriparesis ([place])    Spasticity    Spinal cord tumor    Wheelchair bound     Past Surgical History:  Procedure Laterality Date   LAMINECTOMY N/A 06/01/2019   Procedure: Cervical six-seven Laminoplasty, biopsy and resection of intramedullary spinal cord tumor;  Surgeon: [name], MD;  Location: [facility];  Service: Neurosurgery;  Laterality: N/A;   NO PAST SURGERIES      There were no vitals filed for this visit.   Subjective Assessment - 08/14/20 1343     Subjective Some soarness undefined.  Have most medical persons lined up as well as therapy.  I have been more active in past few weeks getting ready to move.    Limitations Standing;Walking;House hold activities    Pain Score 6     Pain Location Generalized    Pain Descriptors / Indicators Aching    Pain Onset More than a month ago    Pain Frequency Intermittent                                         PT Short Term Goals - 08/02/20 1557       PT SHORT TERM GOAL #1   Title = LTG               PT Long Term Goals - 07/31/20 2202       PT  LONG TERM GOAL #1   Title Patient will demo >54/56 on berg balance scale wearing bilat GRAFO's to improve functional balance and reduce fall risk    Baseline 52/56    Time 4    Period Weeks    Status Revised    Target Date 08/30/20      PT LONG TERM GOAL #2   Title Patient will be able to ambulate >2000 feet outside with cane and bilat GRAFO MOD I to improve short distance community ambulation.    Baseline 2000 with cane and GRAFO, close supervision    Time 4    Period Weeks    Status Revised    Target Date 08/30/20      PT LONG TERM GOAL #3   Status --      PT LONG TERM GOAL #4   Title Pt will demonstrate ability to negotiate curb, ramp and 12 stairs with cane, bilat GRAFO's safely Mod I  Baseline 07/31/20: continues to need up to supervision with curb/ramp. progressing with stairs    Time 4    Period Weeks    Status Revised    Target Date 08/30/20           Pt seen for aquatic therapy today.  Treatment took place in water 3.25-4 ft in depth at the [facility] pool. Temp of water was 91.  Pt entered/exited the pool via stairs step through pattern independently with bilat rail.   Warm up: heel/toe walking x4 laps across pool  side stepping x4 laps from   Ambulation without support: long strides, march 2 laps  Seated Bicycling x 5 mins warm up    Standing Supported by yellow noodle Slow march x20; hip extension 2 x15; hip abduction 2x15;   board trunk flexion x10 lateral board rotation x10 each way seated Steps step up x20 each leg; backward step up x20 each leg;    Balance exercises:  Noodle kick down x20 without UE support.  Pt with 2 LOB and multiple perturbations recovering indep. tandem Stance eyes open multiple trials able to hold for 10 secs Narrow BOS eyes open and eyes closed able to hold x 20 seconds. Manual perturbations completed with vision for added core strength and balance challenge. Ai Chi 13 modified using noodle for support.  Pt  challenged to complete up to 5 repetitions w/o LOB.  Pt with unsteadiness with completion although no LOB using ankle and hip strategies able to meet challenge rle after multiple tries.    Pt requires buoyancy for support and to offload joints with strengthening exercises. Viscosity of the water is needed for resistance of strengthening; water current perturbations provides challenge to standing balance unsupported, requiring increased core activation.        Plan - 08/14/20 1400     Clinical Impression Statement Advanced balance/core challenes.  Added modified Ai Chi 13 holding to yellow noodle.  Pt with difficulty maintaining position, using hip strategy  multiple LOB with self recovery other than 2-3 times.    Personal Factors and Comorbidities Age;Past/Current Experience;Comorbidity 2;Time since onset of injury/illness/exacerbation;Transportation;Comorbidity 1    PT Treatment/Interventions ADLs/Self Care Home Management;Gait training;Stair training;Functional mobility training;Therapeutic activities;Therapeutic exercise;Balance training;Neuromuscular re-education;Patient/family education;Passive range of motion;Energy conservation;Electrical Stimulation;DME Instruction;Orthotic Fit/Training;Aquatic Therapy             Patient will benefit from skilled therapeutic intervention in order to improve the following deficits and impairments:  Abnormal gait, Decreased activity tolerance, Decreased balance, Decreased mobility, Decreased endurance, Decreased coordination, Decreased strength, Difficulty walking, Impaired sensation, Impaired tone, Postural dysfunction, Pain  Visit Diagnosis: Other abnormalities of gait and mobility  Other disturbances of skin sensation  Visuospatial deficit  Spinal cord tumor  Other lack of coordination  Muscle weakness (generalized)  Abnormal posture  Unsteadiness on feet  Difficulty in walking, not elsewhere classified  Pain     Problem  List Patient Active Problem List   Diagnosis Date Noted   Incomplete paraplegia ([place]) 11/08/2019   Foot drop, bilateral 11/08/2019   Spasticity 08/18/2019   Chronic pain syndrome 08/18/2019   Nerve pain 08/18/2019   Clonus 07/21/2019   Spinal cord tumor    Pain    Transaminitis    Leukocytosis    Neurogenic bowel    Postoperative pain    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 [place], [place], [address] Phone: [phone]   Fax:  [phone]  Name: Jacob Lambert MRN: [id number] Date of Birth: 03-02-1985

## 2020-08-16 DIAGNOSIS — H18622 Keratoconus, unstable, left eye: Secondary | ICD-10-CM | POA: Diagnosis not present

## 2020-08-21 DIAGNOSIS — H18622 Keratoconus, unstable, left eye: Secondary | ICD-10-CM | POA: Diagnosis not present

## 2020-08-26 DIAGNOSIS — R102 Pelvic and perineal pain: Secondary | ICD-10-CM | POA: Diagnosis not present

## 2020-08-26 DIAGNOSIS — R300 Dysuria: Secondary | ICD-10-CM | POA: Diagnosis not present

## 2020-08-26 DIAGNOSIS — N411 Chronic prostatitis: Secondary | ICD-10-CM | POA: Diagnosis not present

## 2020-08-26 DIAGNOSIS — N5312 Painful ejaculation: Secondary | ICD-10-CM | POA: Diagnosis not present

## 2020-08-28 ENCOUNTER — Ambulatory Visit (HOSPITAL_BASED_OUTPATIENT_CLINIC_OR_DEPARTMENT_OTHER): Payer: BC Managed Care – PPO | Admitting: Physical Therapy

## 2020-08-29 ENCOUNTER — Ambulatory Visit: Payer: BC Managed Care – PPO | Attending: Physical Medicine and Rehabilitation | Admitting: Physical Therapy

## 2020-08-29 ENCOUNTER — Other Ambulatory Visit: Payer: Self-pay

## 2020-08-29 DIAGNOSIS — M6281 Muscle weakness (generalized): Secondary | ICD-10-CM | POA: Insufficient documentation

## 2020-08-29 DIAGNOSIS — R278 Other lack of coordination: Secondary | ICD-10-CM | POA: Diagnosis not present

## 2020-08-29 DIAGNOSIS — R2681 Unsteadiness on feet: Secondary | ICD-10-CM | POA: Diagnosis not present

## 2020-08-29 DIAGNOSIS — R2689 Other abnormalities of gait and mobility: Secondary | ICD-10-CM | POA: Insufficient documentation

## 2020-08-29 DIAGNOSIS — R208 Other disturbances of skin sensation: Secondary | ICD-10-CM | POA: Diagnosis not present

## 2020-08-29 DIAGNOSIS — R262 Difficulty in walking, not elsewhere classified: Secondary | ICD-10-CM | POA: Insufficient documentation

## 2020-08-29 NOTE — Therapy (Signed)
[facility] [address] [place], [place], [address] Phone: [phone]   Fax:  [phone]  Physical Therapy Treatment  Patient Details  Name: Jacob Lambert MRN: [id number] Date of Birth: 1985/06/30 Referring Provider (PT): Dr. Courtney Heys   Encounter Date: 08/29/2020   PT End of Session - 08/29/20 1152     Visit Number 47    Number of Visits 38    Date for PT Re-Evaluation 08/30/20    Authorization Type BCBS - VL: 30 PT/OT combined- pt aware of self pay after visit limit reached. keeping track of overage visits to assist with pt keeping track    PT Start Time 1148    PT Stop Time 1230    PT Time Calculation (min) 42 min    Activity Tolerance Patient tolerated treatment well    Behavior During Therapy [facility] for tasks assessed/performed             Past Medical History:  Diagnosis Date   Anxiety    Clonus    Depression    Headache    Pneumonia    "walking"   Quadriplegia and quadriparesis ([place])    Spasticity    Spinal cord tumor    Wheelchair bound     Past Surgical History:  Procedure Laterality Date   LAMINECTOMY N/A 06/01/2019   Procedure: Cervical six-seven Laminoplasty, biopsy and resection of intramedullary spinal cord tumor;  Surgeon: [name], MD;  Location: [facility];  Service: Neurosurgery;  Laterality: N/A;   NO PAST SURGERIES      There were no vitals filed for this visit.   Subjective Assessment - 08/29/20 1153     Subjective Is packed and is moving in 5 days.  Eye is healing well but still having some visual distortion.  Is still working on getting all medical appointments set up for [place].  Had one fall out of rolling chair, pushed back and chair went out from under buttocks.  No injury.    Limitations Standing;Walking;House hold activities    Patient Stated Goals Progress walking    Currently in Pain? No/denies    Pain Onset More than a month ago                 [facility] PT Assessment - 08/29/20 1158       Assessment   Medical Diagnosis Weakness (cervical spinal cord tumor resection/post radiation)    Referring Provider (PT) Dr. Courtney Heys    Onset Date/Surgical Date 06/01/19    Hand Dominance Left    Prior Therapy [facility] inpatient rehab, home health      Precautions   Precautions Fall      Prior Function   Level of Independence Independent      Transfers   Transfers Sit to Stand;Stand to Sit    Sit to Stand 6: Modified independent (Device/Increase time)    Stand to Sit 6: Modified independent (Device/Increase time)      Ambulation/Gait   Ambulation/Gait Yes    Ambulation/Gait Assistance 6: Modified independent (Device/Increase time)    Ambulation/Gait Assistance Details with cane and bilat GRAFO, slowed velocity with up/down inclines.  Fatigued after 1500 feet    Ambulation Distance (Feet) 1500 Feet    Assistive device Straight cane    Gait Pattern Step-through pattern;Lateral hip instability;Narrow base of support    Ambulation Surface Unlevel;Outdoor;Paved    Stairs Yes    Stairs Assistance 6: Modified independent (Device/Increase time)    Stairs  Assistance Details (indicate cue type and reason) with bilat GRAFO and cane, 1 rail    Stair Management Technique One rail Right;Step to pattern;Forwards;With cane    Number of Stairs 12    Height of Stairs 6    Ramp 6: Modified independent (Device)    Ramp Details (indicate cue type and reason) with cane and bilat GRAFO    Curb 6: Modified independent (Device/increase time)    Curb Details (indicate cue type and reason) with cane and bilat GRAFO      Standardized Balance Assessment   Standardized Balance Assessment Berg Balance Test      Berg Balance Test   Sit to Stand Able to stand without using hands and stabilize independently    Standing Unsupported Able to stand safely 2 minutes    Sitting with Back Unsupported but Feet Supported on Floor or Stool Able to sit safely and  securely 2 minutes    Stand to Sit Sits safely with minimal use of hands    Transfers Able to transfer safely, minor use of hands    Standing Unsupported with Eyes Closed Able to stand 10 seconds safely    Standing Unsupported with Feet Together Able to place feet together independently and stand 1 minute safely    From Standing, Reach Forward with Outstretched Arm Can reach forward >12 cm safely (5")    From Standing Position, Pick up Object from Floor Able to pick up shoe safely and easily    From Standing Position, Turn to Look Behind Over each Shoulder Looks behind from both sides and weight shifts well    Turn 360 Degrees Able to turn 360 degrees safely in 4 seconds or less    Standing Unsupported, Alternately Place Feet on Step/Stool Able to stand independently and safely and complete 8 steps in 20 seconds    Standing Unsupported, One Foot in Front Able to take small step independently and hold 30 seconds    Standing on One Leg Able to lift leg independently and hold equal to or more than 3 seconds    Total Score 51    Berg comment: 51/56 with bilat GRAFO; slight decline in balance when performing narrow BOS and SLS               PT Education - 08/29/20 1607     Education Details D/C after aquatic therapy tomorrow; progress towards goals and areas to continue to focus on    Person(s) Educated Patient    Methods Explanation    Comprehension Verbalized understanding              PT Short Term Goals - 08/02/20 1557       PT SHORT TERM GOAL #1   Title = LTG               PT Long Term Goals - 08/29/20 1221       PT LONG TERM GOAL #1   Title Patient will demo >54/56 on berg balance scale wearing bilat GRAFO's to improve functional balance and reduce fall risk    Baseline 51/56    Time 4    Period Weeks    Status Not Met      PT LONG TERM GOAL #2   Title Patient will be able to ambulate >2000 feet outside with cane and bilat GRAFO MOD I to improve short  distance community ambulation.    Baseline 1500 MOD I with cane and bilat GRAFO    Time  4    Period Weeks    Status Partially Met      PT LONG TERM GOAL #4   Title Pt will demonstrate ability to negotiate curb, ramp and 12 stairs with cane, bilat GRAFO's safely Mod I    Time 4    Period Weeks    Status Achieved                   Plan - 08/29/20 1609     Clinical Impression Statement Pt has made steady progress with a combination of land and aquatic based therapy and has met 1/3 LTG.  Pt demonstrates improved endurance and activity tolerance as well as decreased falls risk when ambulating in the community with cane but continues to fatigue quickly, have greater difficulty maintaining balance on uneven surfaces and requires use of rollator or power w/c for distances greater than 1500-2000 feet.  Pt also continues to demonstrate impaired ability to maintain balance in narrow BOS and in sustained SLS.  Pt will complete final aquatic session tomorrow and will be D/C from [facility] outpatient neurorehab in order to transition to PT in [place].    Personal Factors and Comorbidities Age;Past/Current Experience;Comorbidity 2;Time since onset of injury/illness/exacerbation;Transportation;Comorbidity 1    PT Treatment/Interventions ADLs/Self Care Home Management;Gait training;Stair training;Functional mobility training;Therapeutic activities;Therapeutic exercise;Balance training;Neuromuscular re-education;Patient/family education;Passive range of motion;Energy conservation;Electrical Stimulation;DME Instruction;Orthotic Fit/Training;Aquatic Therapy    PT Next Visit Plan Final aquatic session; please route to me and I can write final D/C.    PT Home Exercise Plan Access Code: [email or web address]; [email or web address]. plus heel raises, and HS curls verbally added. Pt stated able to recall these without new handouts.    Consulted and Agree with Plan of Care Patient             Patient will benefit from skilled  therapeutic intervention in order to improve the following deficits and impairments:  Abnormal gait, Decreased activity tolerance, Decreased balance, Decreased mobility, Decreased endurance, Decreased coordination, Decreased strength, Difficulty walking, Impaired sensation, Impaired tone, Postural dysfunction, Pain  Visit Diagnosis: Other abnormalities of gait and mobility  Other disturbances of skin sensation  Unsteadiness on feet  Difficulty in walking, not elsewhere classified  Other lack of coordination  Muscle weakness (generalized)     Problem List Patient Active Problem List   Diagnosis Date Noted   Incomplete paraplegia ([place]) 11/08/2019   Foot drop, bilateral 11/08/2019   Spasticity 08/18/2019   Chronic pain syndrome 08/18/2019   Nerve pain 08/18/2019   Clonus 07/21/2019   Spinal cord tumor    Pain    Transaminitis    Leukocytosis    Neurogenic bowel    Postoperative pain    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 [place], [place], [address] Phone: [phone]   Fax:  [phone]  Name: Jacob Lambert MRN: [id number] Date of Birth: November 22, 1985

## 2020-08-30 ENCOUNTER — Ambulatory Visit (HOSPITAL_BASED_OUTPATIENT_CLINIC_OR_DEPARTMENT_OTHER): Payer: BC Managed Care – PPO | Admitting: Physical Therapy

## 2020-08-30 ENCOUNTER — Encounter (HOSPITAL_BASED_OUTPATIENT_CLINIC_OR_DEPARTMENT_OTHER): Payer: Self-pay | Admitting: Physical Therapy

## 2020-08-30 DIAGNOSIS — R208 Other disturbances of skin sensation: Secondary | ICD-10-CM | POA: Diagnosis not present

## 2020-08-30 DIAGNOSIS — D497 Neoplasm of unspecified behavior of endocrine glands and other parts of nervous system: Secondary | ICD-10-CM

## 2020-08-30 DIAGNOSIS — R41842 Visuospatial deficit: Secondary | ICD-10-CM

## 2020-08-30 DIAGNOSIS — R2681 Unsteadiness on feet: Secondary | ICD-10-CM

## 2020-08-30 DIAGNOSIS — R293 Abnormal posture: Secondary | ICD-10-CM

## 2020-08-30 DIAGNOSIS — M6281 Muscle weakness (generalized): Secondary | ICD-10-CM

## 2020-08-30 DIAGNOSIS — R2689 Other abnormalities of gait and mobility: Secondary | ICD-10-CM | POA: Diagnosis not present

## 2020-08-30 DIAGNOSIS — R52 Pain, unspecified: Secondary | ICD-10-CM | POA: Diagnosis not present

## 2020-08-30 DIAGNOSIS — R262 Difficulty in walking, not elsewhere classified: Secondary | ICD-10-CM | POA: Diagnosis not present

## 2020-08-30 DIAGNOSIS — R278 Other lack of coordination: Secondary | ICD-10-CM | POA: Diagnosis not present

## 2020-08-30 NOTE — Therapy (Addendum)
[facility] [address] [place], [place], [address] Phone: [phone]   Fax:  [phone]  Physical Therapy Treatment  PHYSICAL THERAPY DISCHARGE SUMMARY  Visits from Start of Care: 40  Current functional level related to goals / functional outcomes: Pt indep with modifications with all ADL's and functional mobilty   Remaining deficits: ambulation   Education / Equipment: Management of condition; HEP   Patient agrees to discharge. Patient goals were partially met. Patient is being discharged due to  moved from area.  [facility] Tharon Aquas) Colm Lyford MPT 04/03/21  Patient Details  Name: Jacob Lambert MRN: [id number] Date of Birth: [birth year] Referring Provider (PT): Dr. Courtney Heys   Encounter Date: 08/30/2020   PT End of Session - 08/30/20 0947     Visit Number 47    Number of Visits 90    Date for PT Re-Evaluation 08/30/20    Authorization Type BCBS - VL: 30 PT/OT combined- pt aware of self pay after visit limit reached. keeping track of overage visits to assist with pt keeping track    PT Start Time 0910    PT Stop Time 0945    PT Time Calculation (min) 35 min    Equipment Utilized During Treatment Other (comment)    Activity Tolerance Patient tolerated treatment well    Behavior During Therapy WFL for tasks assessed/performed             Past Medical History:  Diagnosis Date   Anxiety    Clonus    Depression    Headache    Pneumonia    "walking"   Quadriplegia and quadriparesis ([place])    Spasticity    Spinal cord tumor    Wheelchair bound     Past Surgical History:  Procedure Laterality Date   LAMINECTOMY N/A 06/01/2019   Procedure: Cervical six-seven Laminoplasty, biopsy and resection of intramedullary spinal cord tumor;  Surgeon: [name], MD;  Location: [facility];  Service: Neurosurgery;  Laterality: N/A;   NO PAST SURGERIES      There were no vitals filed for this visit.   Subjective  Assessment - 08/30/20 1410     Subjective Last aquatics visit.  Pt will be moving next week. "all is ready to go"    Currently in Pain? Yes    Pain Score 5     Pain Descriptors / Indicators Aching    Pain Type Chronic pain                                         PT Short Term Goals - 08/02/20 1557       PT SHORT TERM GOAL #1   Title = LTG               PT Long Term Goals - 08/29/20 1221       PT LONG TERM GOAL #1   Title Patient will demo >54/56 on berg balance scale wearing bilat GRAFO's to improve functional balance and reduce fall risk    Baseline 51/56    Time 4    Period Weeks    Status Not Met      PT LONG TERM GOAL #2   Title Patient will be able to ambulate >2000 feet outside with cane and bilat GRAFO MOD I to improve short distance community ambulation.    Baseline 1500 MOD I with cane  and bilat GRAFO    Time 4    Period Weeks    Status Partially Met      PT LONG TERM GOAL #4   Title Pt will demonstrate ability to negotiate curb, ramp and 12 stairs with cane, bilat GRAFO's safely Mod I    Time 4    Period Weeks    Status Achieved               Pt seen for aquatic therapy today.  Treatment took place in water 3.25-4 ft in depth at the [employer] pool. Temp of water was 91.  Pt entered/exited the pool via stairs step through pattern independently with bilat rail.   Warm up: heel/toe walking x4 laps across pool  side stepping x4 laps from   Ambulation without support: long strides, march 2 laps   Seated Bicycling x 5 mins warm up      Standing Supported by yellow noodle with ankle buoys March x20; hip extension 2 x15; hip abduction 2x15; hip flex 2 x 15 Ai Chi #13    Balance exercises:  Balance and core strength challenges standing with wide BOS on noodle.  Gaining static balance with heels and then toes on ground x 30 sec (requiring many trails); Then progressed to rotating over noodle gaining  balance heel and toe x 5 reps.  Decreased BOS in 2 more intervals.  Cuing for core tightening to maintain alignment and improve balance ability  Vertical suspension Aerobic capacity/strengthening: Straddling noodle bicylicing 20 quick revolutions to 20 slow x 10 mins.     Pt requires buoyancy for support and to offload joints with strengthening exercises. Viscosity of the water is needed for resistance of strengthening; water current perturbations provides challenge to standing balance unsupported, requiring increased core activation.        Plan - 08/30/20 1414     Clinical Impression Statement Discharge to PT's In [place].  Pt has made steady progress in aquatics.  Most significantly he has demonstrated improved core strength as evidence by improvement in balance with static and dynamic challenges and aerobic capacity.  Pt will continue to benefit from aquatic therapy going forward.    PT Treatment/Interventions ADLs/Self Care Home Management;Gait training;Stair training;Functional mobility training;Therapeutic activities;Therapeutic exercise;Balance training;Neuromuscular re-education;Patient/family education;Passive range of motion;Energy conservation;Electrical Stimulation;DME Instruction;Orthotic Fit/Training;Aquatic Therapy    PT Home Exercise Plan Access Code: [email or web address]; [email or web address]. plus heel raises, and HS curls verbally added. Pt stated able to recall these without new handouts.             Patient will benefit from skilled therapeutic intervention in order to improve the following deficits and impairments:  Abnormal gait, Decreased activity tolerance, Decreased balance, Decreased mobility, Decreased endurance, Decreased coordination, Decreased strength, Difficulty walking, Impaired sensation, Impaired tone, Postural dysfunction, Pain  Visit Diagnosis: Other abnormalities of gait and mobility  Muscle weakness (generalized)  Visuospatial deficit  Other disturbances of skin  sensation  Unsteadiness on feet  Spinal cord tumor  Abnormal posture     Problem List Patient Active Problem List   Diagnosis Date Noted   Incomplete paraplegia ([place]) 11/08/2019   Foot drop, bilateral 11/08/2019   Spasticity 08/18/2019   Chronic pain syndrome 08/18/2019   Nerve pain 08/18/2019   Clonus 07/21/2019   Spinal cord tumor    Pain    Transaminitis    Leukocytosis    Neurogenic bowel    Postoperative pain    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 [place], [place], [address] Phone: [phone]   Fax:  [phone]  Name: Jacob Lambert MRN: [id number] Date of Birth: 10-05-1985

## 2020-09-16 ENCOUNTER — Inpatient Hospital Stay: Payer: BC Managed Care – PPO | Admitting: Internal Medicine

## 2020-12-05 ENCOUNTER — Other Ambulatory Visit: Payer: Self-pay | Admitting: Physical Medicine and Rehabilitation

## 2020-12-05 NOTE — Telephone Encounter (Signed)
Pharmacy is sending refill request for Gabapentin and Dantrolene. Would you like to refill?

## 2020-12-19 ENCOUNTER — Other Ambulatory Visit: Payer: Self-pay | Admitting: Physical Medicine and Rehabilitation

## 2020-12-19 NOTE — Telephone Encounter (Signed)
Pharmacy is sending refill request for Gabapentin. Would you like to refill?

## 2021-09-04 IMAGING — RF DG C-ARM 1-60 MIN
1 series · 6 of 6 positions shown · non-contrast
Comparison: 05/23/2019

CLINICAL DATA: C6-C7 laminoplasty and biopsy of intramedullary
tumor

EXAM:
CERVICAL SPINE - 2-3 VIEW; DG C-ARM 1-60 MIN

[Series 1: run · 6 of 6 slices shown]
[im 1/6]
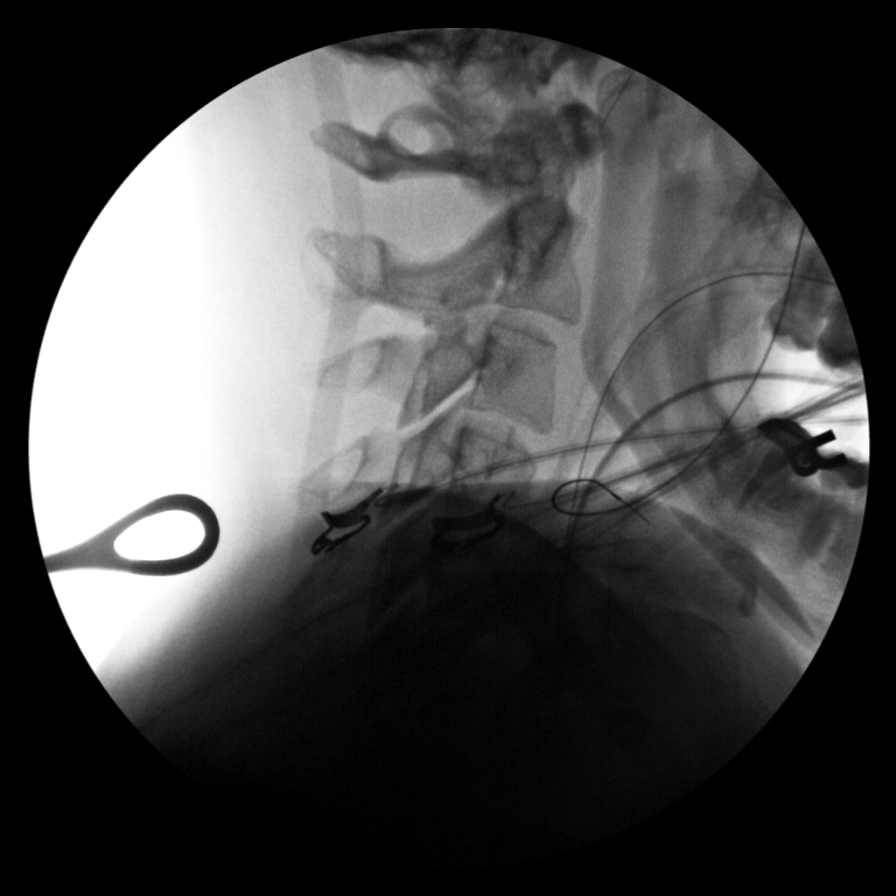
[im 2/6]
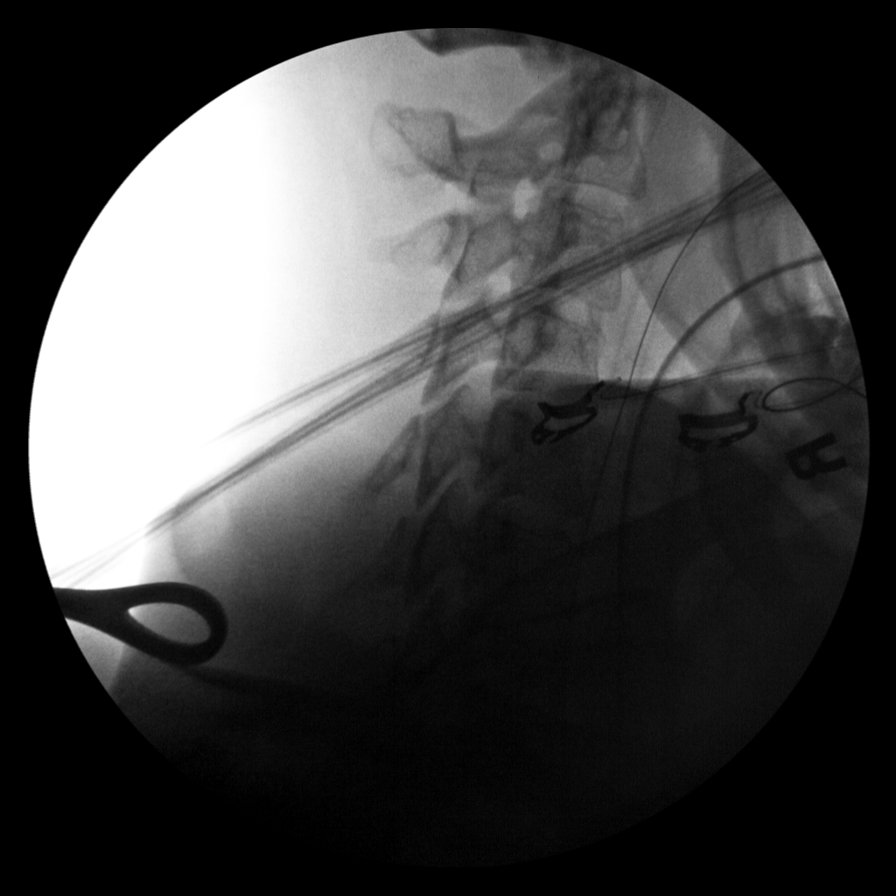
[im 3/6]
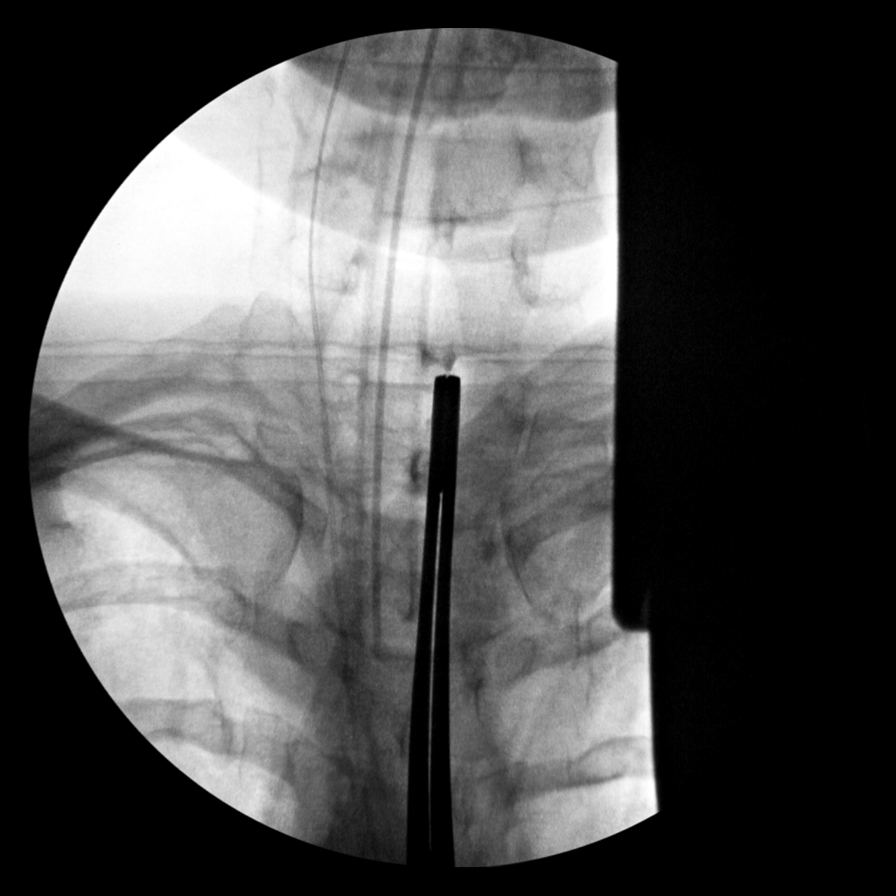
[im 4/6]
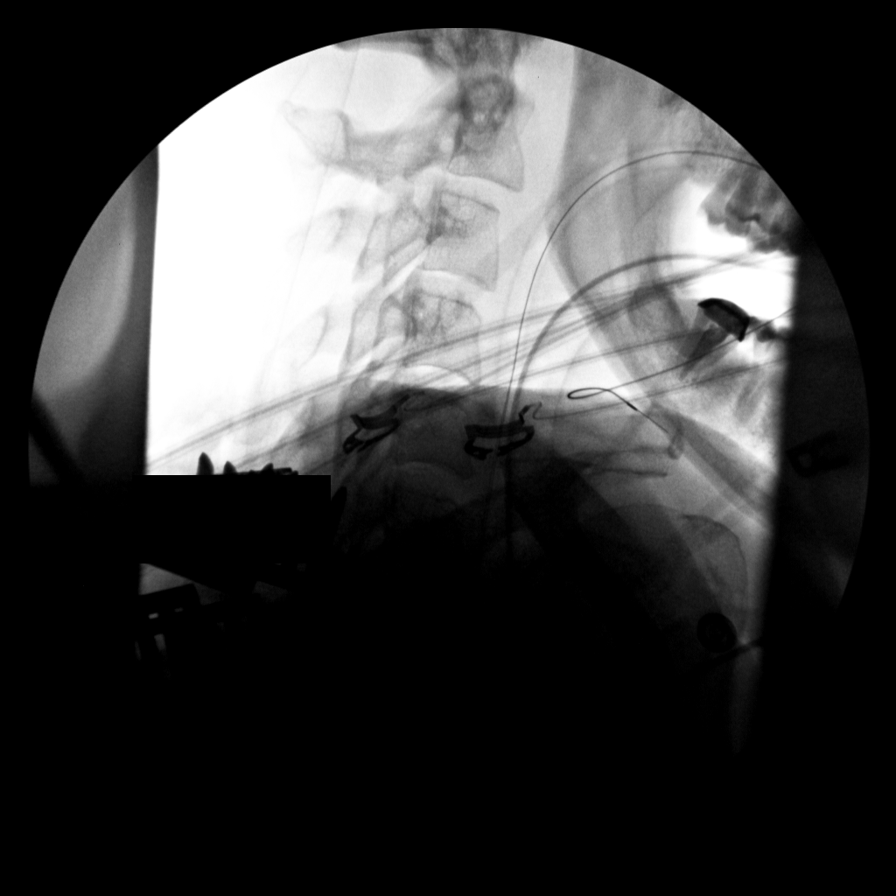
[im 5/6]
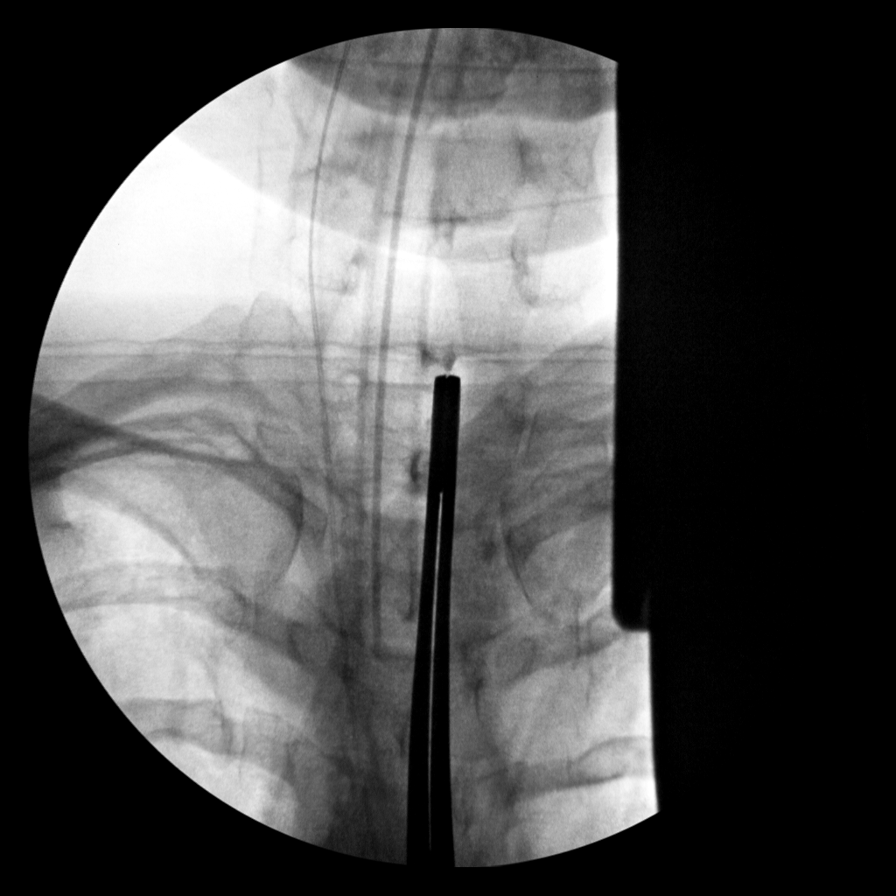
[im 6/6]
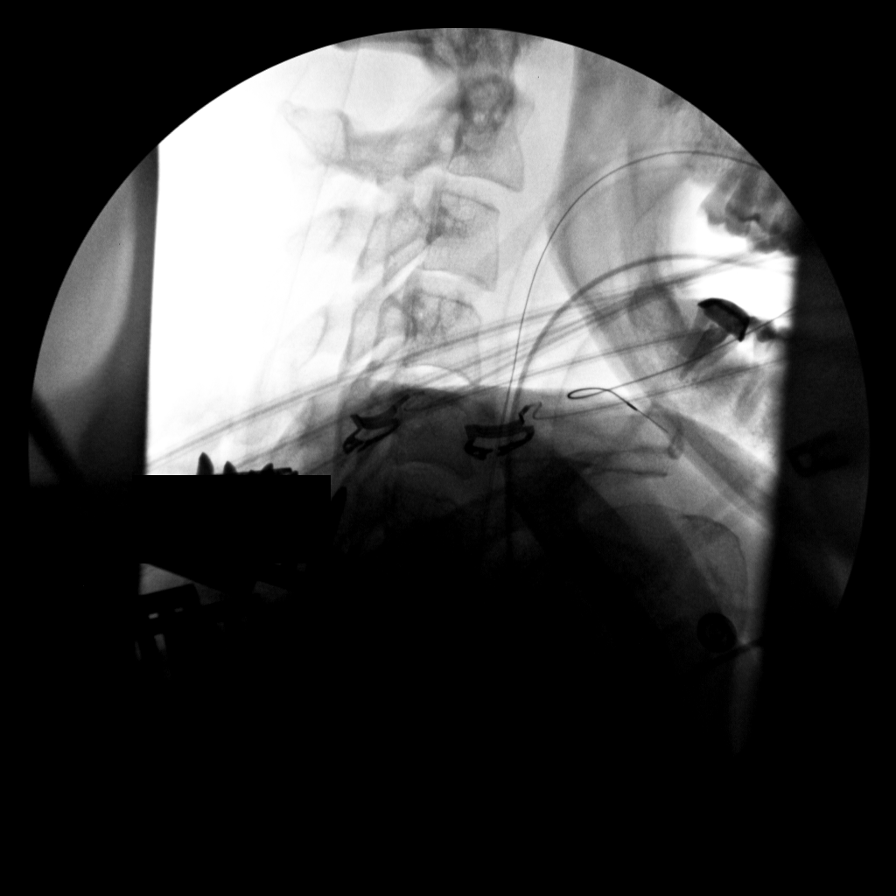

[6 of 6 positions shown; findings below may reference images not displayed]

FINDINGS: Six fluoroscopic images are obtained during performance of the
procedure and are provided for interpretation only. Initial image
demonstrates surgical instrumentation posterior to the C4/C5 level.
Subsequent imaging demonstrates instrumentation at the C7 level. The
final image demonstrates retractors posterior to the C6/C7 level.
Patient is intubated.

FLUOROSCOPY TIME:  16 seconds
IMPRESSION: 1. Intraoperative exam as above. Please refer to the operative
report.

## 2021-09-04 IMAGING — RF DG CERVICAL SPINE 2 OR 3 VIEWS
1 series · 6 of 6 positions shown · non-contrast
Comparison: 05/23/2019

CLINICAL DATA: C6-C7 laminoplasty and biopsy of intramedullary
tumor

EXAM:
CERVICAL SPINE - 2-3 VIEW; DG C-ARM 1-60 MIN

[Series 1: run · 6 of 6 slices shown]
[im 1/6]
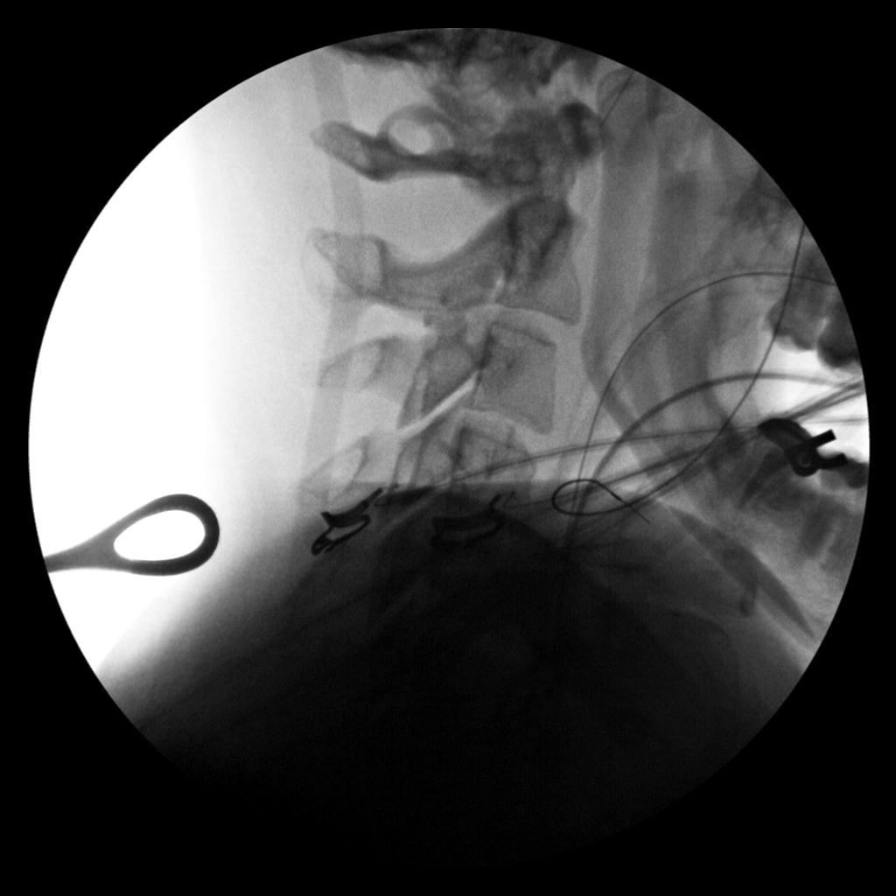
[im 2/6]
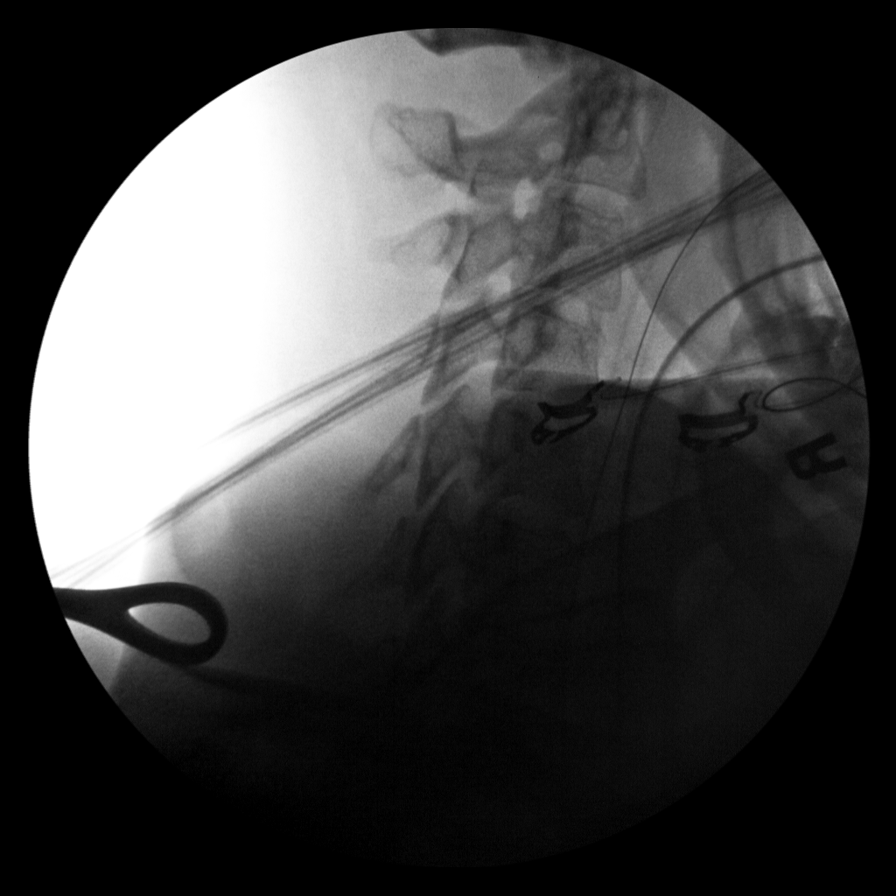
[im 3/6]
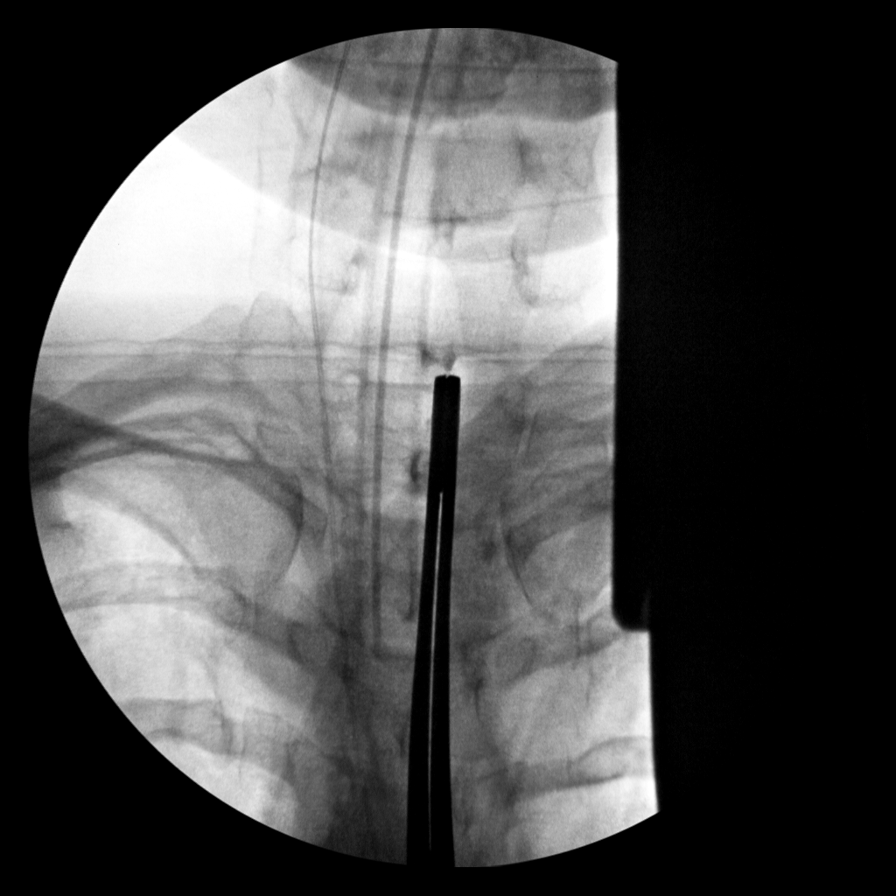
[im 4/6]
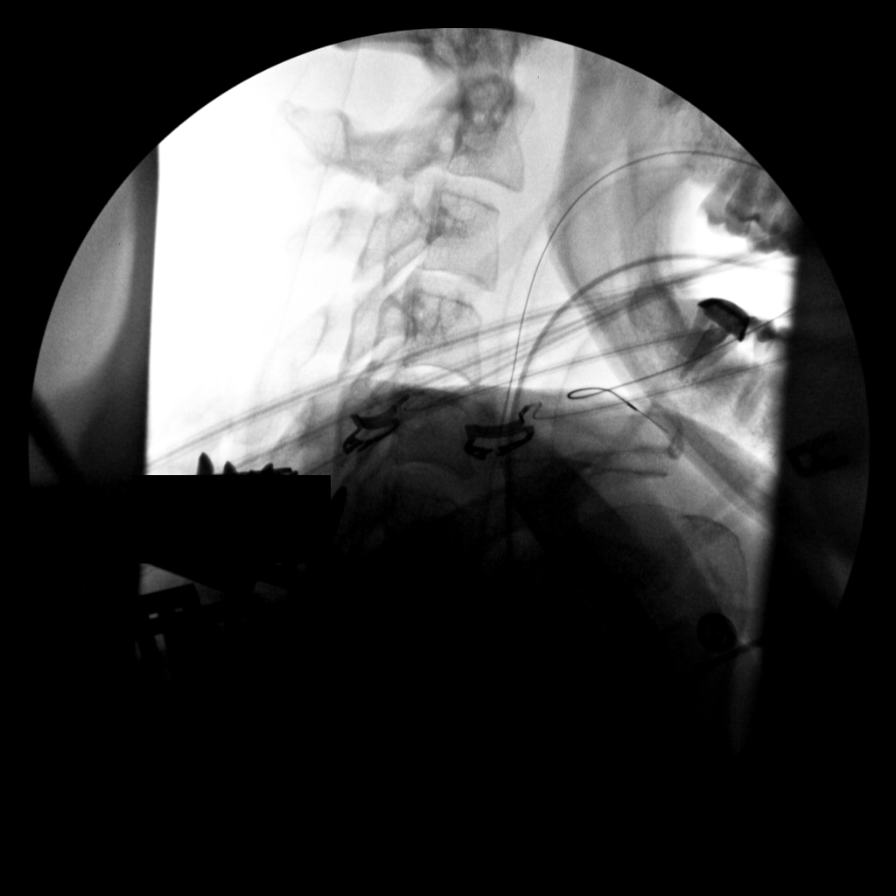
[im 5/6]
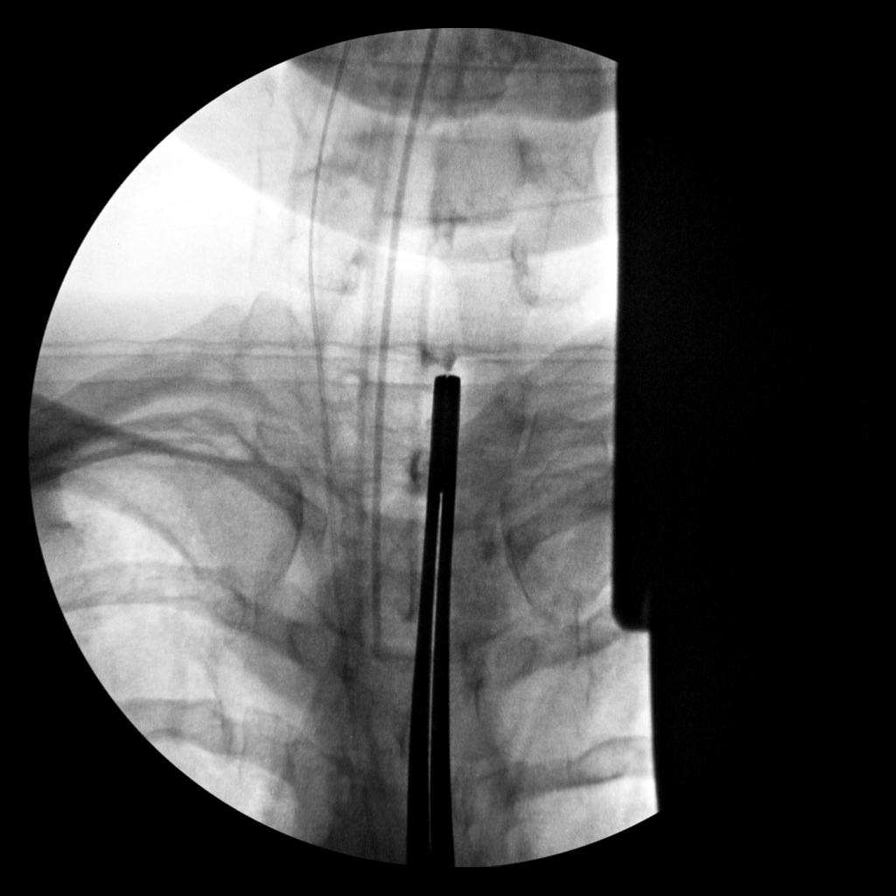
[im 6/6]
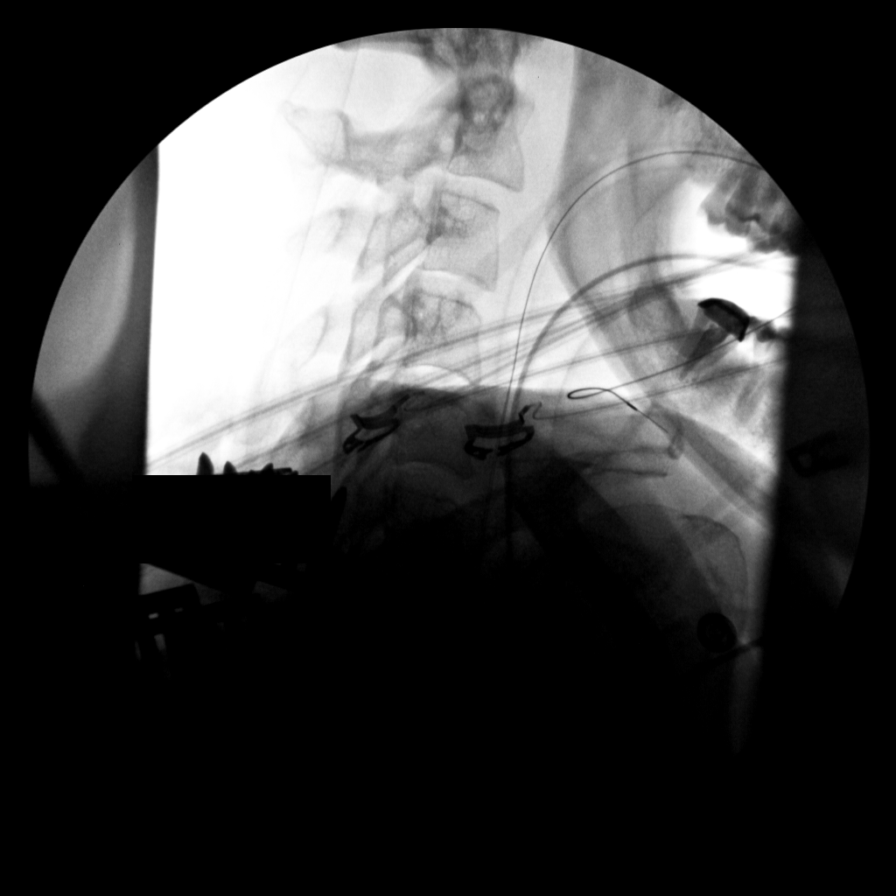

[6 of 6 positions shown; findings below may reference images not displayed]

FINDINGS: Six fluoroscopic images are obtained during performance of the
procedure and are provided for interpretation only. Initial image
demonstrates surgical instrumentation posterior to the C4/C5 level.
Subsequent imaging demonstrates instrumentation at the C7 level. The
final image demonstrates retractors posterior to the C6/C7 level.
Patient is intubated.

FLUOROSCOPY TIME:  16 seconds
IMPRESSION: 1. Intraoperative exam as above. Please refer to the operative
report.

## 2021-09-13 IMAGING — DX DG ABD PORTABLE 1V
1 series · 1 of 1 positions shown · non-contrast
Comparison: None.

CLINICAL DATA: Abdominal pain

EXAM:
PORTABLE ABDOMEN - 1 VIEW

[abdomen kub]
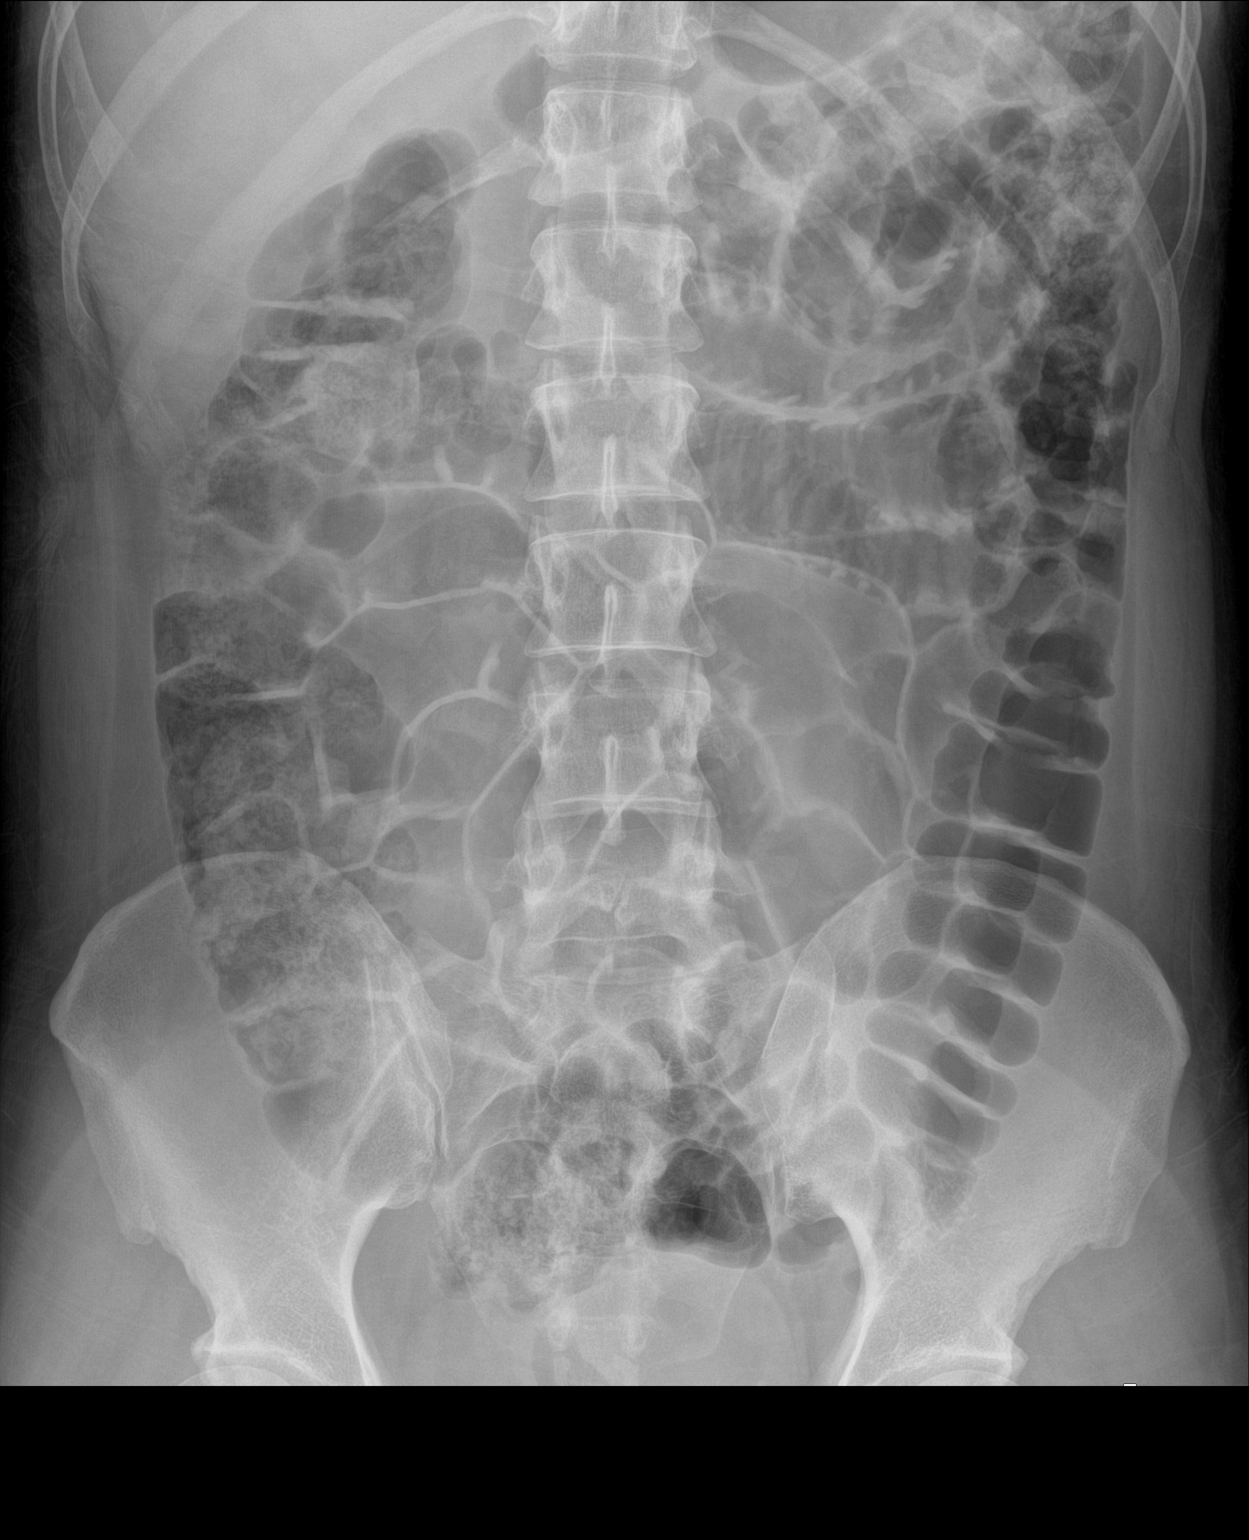

[1 of 1 positions shown; findings below may reference images not displayed]

FINDINGS: Diffuse gaseous distension of bowel without over distension or
obstructive pattern. Gas reaches the rectum. No concerning mass
effect or gas collection. Left pelvic calcifications are usually
phleboliths.
IMPRESSION: Diffuse bowel gas without obstructive pattern.

## 2022-03-26 IMAGING — MR MR CERVICAL SPINE WO/W CM
5 of 8 series · 27 of 48 positions shown · IV contrast (gadavist)
Comparison: 06/01/2019

CLINICAL DATA: Spinal cord neoplasm status post radiation
treatment.

EXAM:
MRI CERVICAL SPINE WITHOUT AND WITH CONTRAST
TECHNIQUE: Multiplanar and multiecho pulse sequences of the cervical spine, to
include the craniocervical junction and cervicothoracic junction,
were obtained without and with intravenous contrast.
CONTRAST:  6.5mL GADAVIST GADOBUTROL 1 MMOL/ML IV SOLN

[Series 2: T2 · sagittal · 3.0mm · 0.43mm/px · 2 of 18 slices shown (1 of 2)]
[im 1/18]
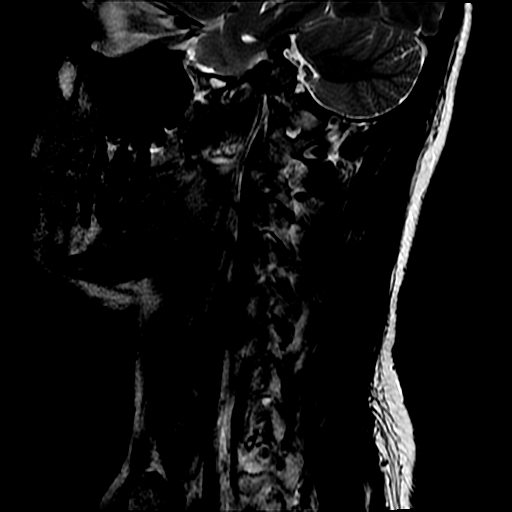
[im 18/18]
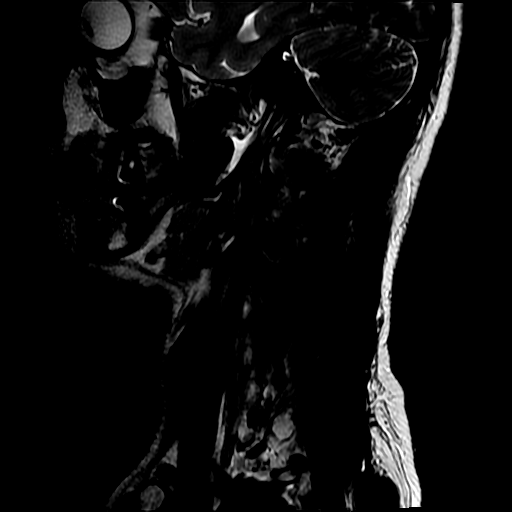

[Series 4: STIR · sagittal · 3.0mm · 0.43mm/px · 3 of 18 slices shown]
[im 1/18]
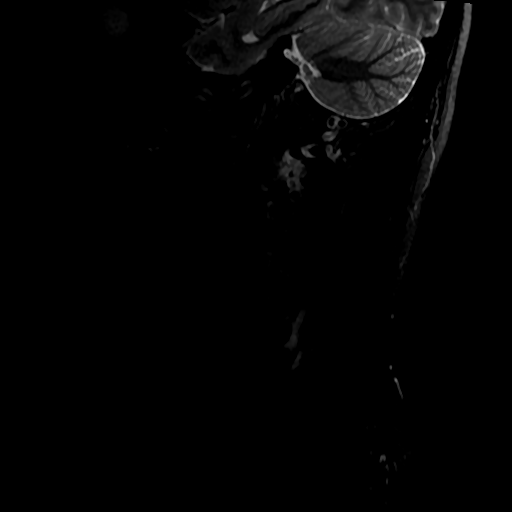
[im 9/18]
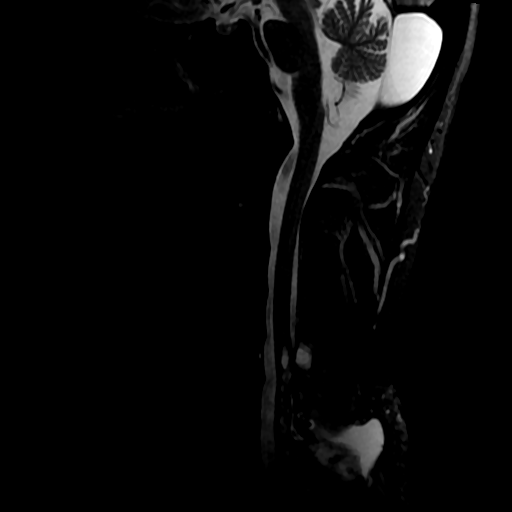
[im 18/18]
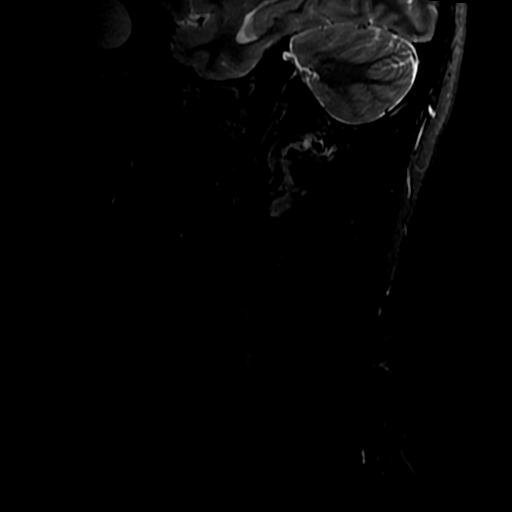

[Series 6: T2 · axial · 2.0mm · 0.70mm/px · z∈[-69,+76]mm · 11 of 74 slices shown (2 of 2)]
[im 1/74]
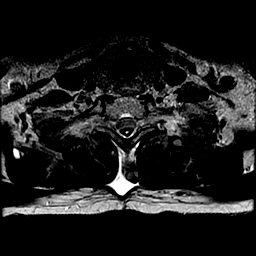
[im 8/74]
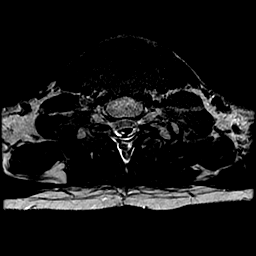
[im 15/74]
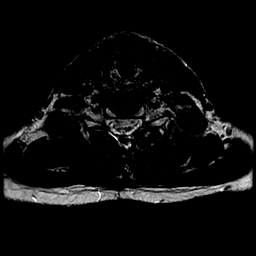
[im 22/74]
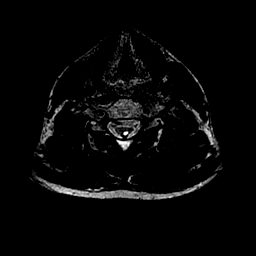
[im 30/74]
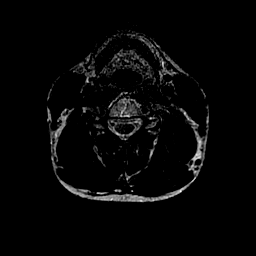
[im 37/74]
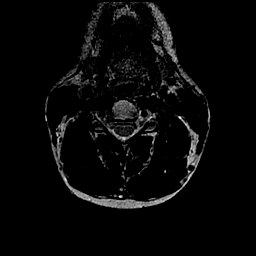
[im 44/74]
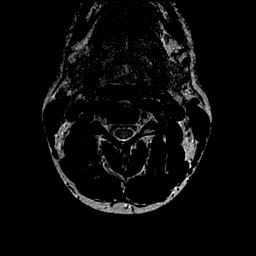
[im 52/74]
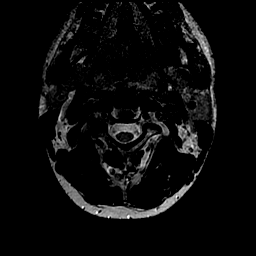
[im 59/74]
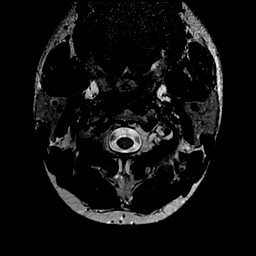
[im 66/74]
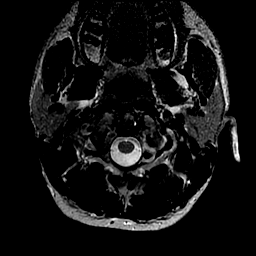
[im 74/74]
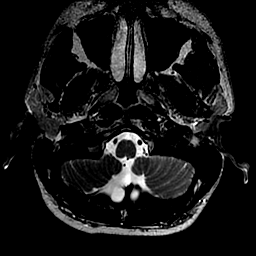

[Series 7: T1 · axial · non-contrast · 2.0mm · 0.35mm/px · z∈[-70,+72]mm · 8 of 73 slices shown]
[im 1/73]
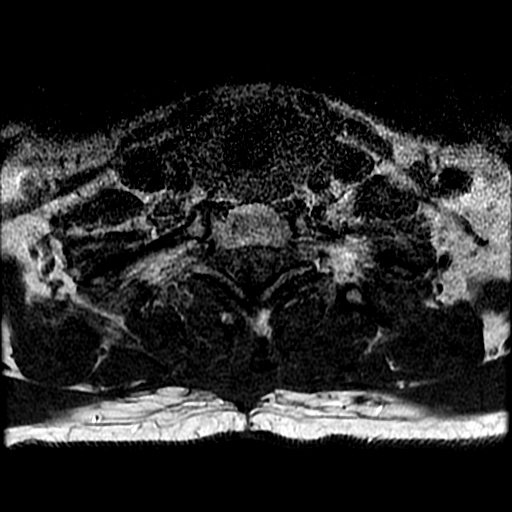
[im 9/73]
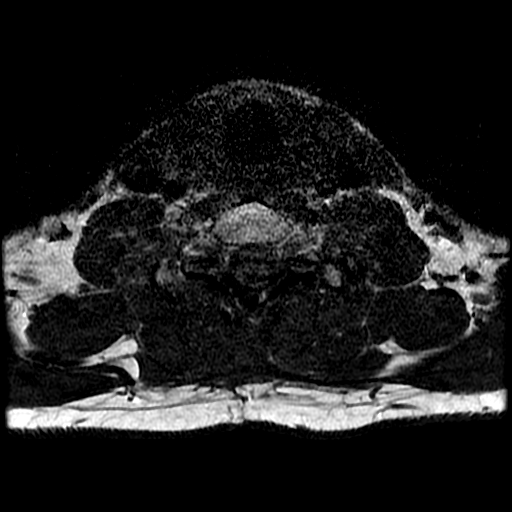
[im 25/73]
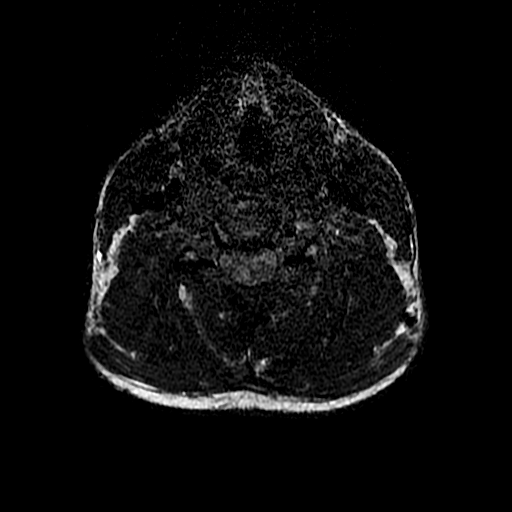
[im 33/73]
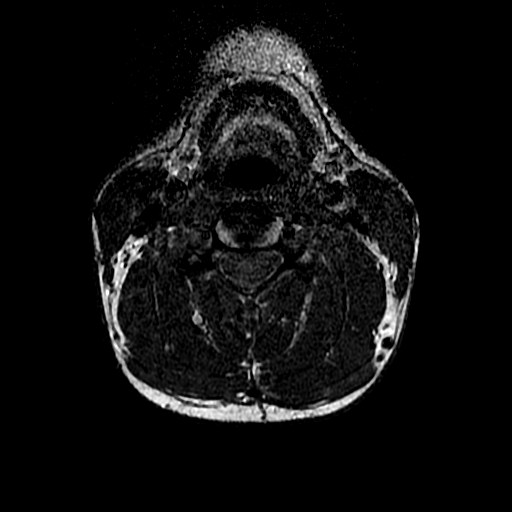
[im 41/73]
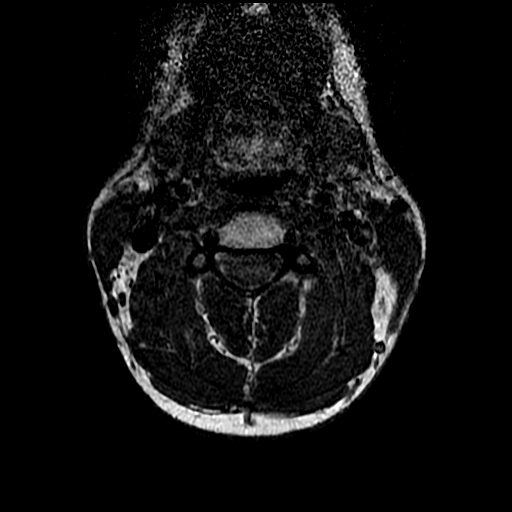
[im 49/73]
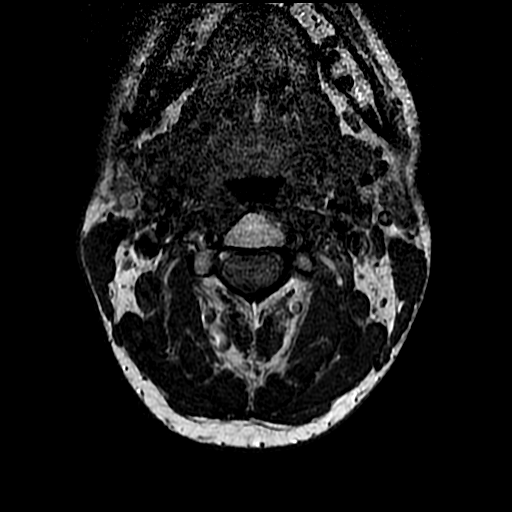
[im 65/73]
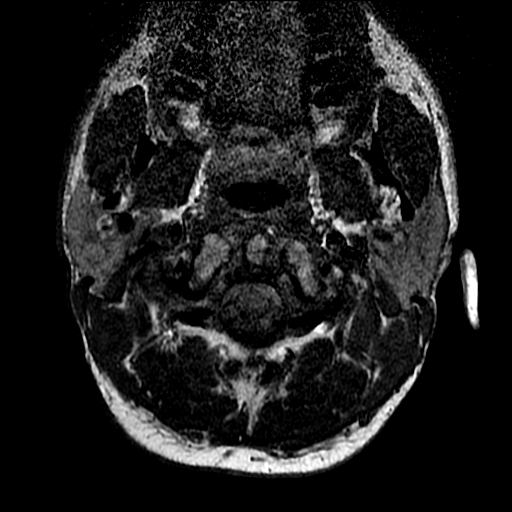
[im 73/73]
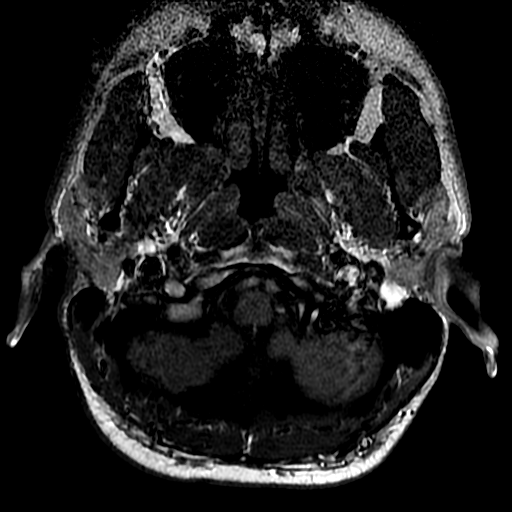

[Series 10: T1 fat-sat post-contrast · sagittal · 3.0mm · 0.43mm/px · 3 of 18 slices shown]
[im 1/18]
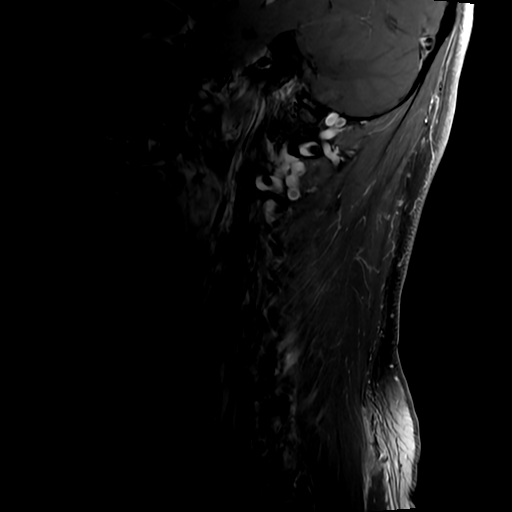
[im 9/18]
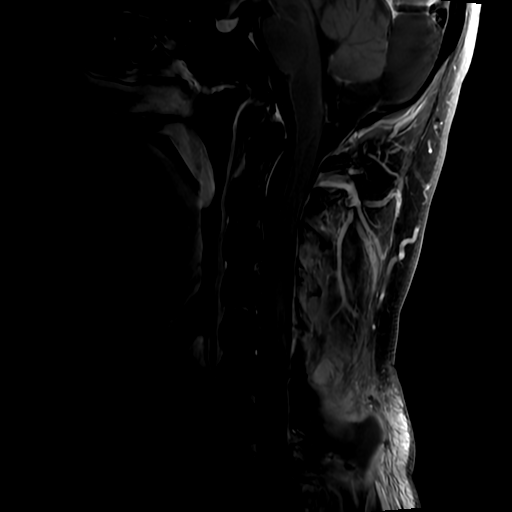
[im 18/18]
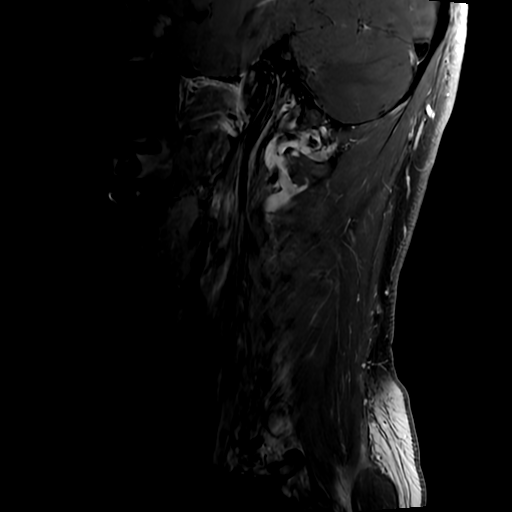

[27 of 48 positions shown; findings below may reference images not displayed]

FINDINGS: Alignment: Physiologic.

Vertebrae: Vertebral body bone marrow signal is normal. Postsurgical
changes at the C6 and C7 levels.

Cord: Areas of cystic change and abnormal T2-weighted signal
intensity within the cervical spinal cord beginning at the C5-6
level have decreased. There is still multifocal cystic change but
the diameter of spaces has generally decreased. No abnormal contrast
enhancement.

Posterior Fossa, vertebral arteries, paraspinal tissues:
Postsurgical changes in the dorsal soft tissues. Enlarged
retrocerebellar CSF space, unchanged.

Disc levels:

No disc herniation, spinal canal stenosis or neural foraminal
stenosis.
IMPRESSION: 1. Decreased abnormal T2-weighted signal intensity within the
cervical spinal cord beginning at the C5-6 level. Decreased diameter
of residual cystic spaces of myelomalacia.
2. No abnormal contrast enhancement.

## 2023-11-07 NOTE — Unmapped External Note (Signed)
 URGENT CARE PROVIDER NOTE ______________________________________________________________________ History: Chief Complaint  Patient presents with  . Skin Problem Simple    HPI Patient is a 38 y/o male  with PMH of atopic dermatitis and psoriasis (no meds) who presents for left fungal toenail infection and an itchy rash to his left lower leg and right forearm that began a few weeks ago. He tried OTC Clotrimazole BID x 3 weeks without improvement to the rash. Rash looks different from previous psoriasis. Denies fever, chills, body aches, URI symptoms, intra-oral lesions, angioedema, rash/lesions on soles or palms, pharyngitis, new lotions or detergents etc, recent illness, sick contacts, recent travel.       Past Medical History[1]  Surgical History[2]  Family History[3]  Social History[4]     Review of Systems  Physical Exam                     Most Recent ED Vital Signs BP 108/83 (11/07/23 1313)  Position Sitting (11/07/23 1313)  Heart Rate 92 (11/07/23 1313)  HR Source Monitor (11/07/23 1313)  RR 16 (11/07/23 1313)  SpO2 98 % (11/07/23 1313)  O2 Flow    Temp 36.3 C (97.3 F) (11/07/23 1313)  Temp Source Temporal (11/07/23 1313)  Weight           Physical Exam Vitals and nursing note reviewed.  Constitutional:      General: He is not in acute distress.    Appearance: Normal appearance. He is not ill-appearing, toxic-appearing or diaphoretic.  HENT:     Head: Normocephalic and atraumatic.     Right Ear: External ear normal.     Left Ear: External ear normal.  Eyes:     Extraocular Movements: Extraocular movements intact.     Conjunctiva/sclera: Conjunctivae normal.     Pupils: Pupils are equal, round, and reactive to light.  Cardiovascular:     Rate and Rhythm: Normal rate.  Pulmonary:     Effort: Pulmonary effort is normal. No respiratory distress.  Musculoskeletal:        General: Normal range of motion.     Cervical back: Normal range of motion. No  rigidity.  Skin:    General: Skin is warm.     Findings: Rash present. Rash is macular and scaling.         Comments: Erythematous annular rash to left anterior shin with scaling throughout. Erythematous macules to left dorsal foot-no scaling. Erythematous annular rash throughout with scaling & no clearance at center to right forearm. No serpiginous border.  +left 1st-3rd toe onychomycosis   Neurological:     General: No focal deficit present.     Mental Status: He is alert and oriented to person, place, and time.  Psychiatric:        Mood and Affect: Mood normal.        Behavior: Behavior normal.     Laboratory Results: Labs Reviewed - No data to display  Radiology Results:  No orders to display     Course    Medications  The following medications were given for treatment during this visit:  Medications - No data to display  MDM MDM Number of Diagnoses or Management Options H/O atopic dermatitis Nummular eczema Onychomycosis Diagnosis management comments: I explained all diagnostic results with the patient. They felt comfortable with discharge home at this time. Recommended follow up with their PCP within the next week, which they agreed to. Discussed signs and symptoms of when to return sooner. They understood and agreed  to plan, no additional questions.   Final diagnoses: H/O atopic dermatitis Nummular eczema Onychomycosis  Differential Diagnosis: nummular eczema, tinea corporis, ICD, psoriasis  MDM -vitals unremarkable -Shared decision making. No evidence of tinea pedis. +onychomycosis, prescribed Ciprodex qhs. Recommended gentle skin care and moisturize with Cetaphil body lotion and vaseline within 5 minutes of getting out of the shower. He has tried topical Clotrimazole BID x 3 weeks without improvement. Discussed ddx which includes tinea corporis, nummular eczema, AD, ICD. Unlikely tinea corporis as no cleared center of serpiginous border & did not improve with  Clotrimazole x 3 weeks. Agreed to trial of Lidex BID x 2 weeks. Agreed to call his PCP tomorrow for follow up in 2 weeks. Discussed return precautions   -Given return precautions -The pt agreed to follow up with their PCP      Final diagnoses:  H/O atopic dermatitis  Nummular eczema  Onychomycosis          Prescriptions provided at the end of this visit: New Prescriptions   CICLOPIROX (PENLAC) 8 % SOLUTION    Apply topically at bedtime.   FLUOCINONIDE (VANOS) 0.05 % OINTMENT    Apply topically 2 (two) times a day for 14 days.          [1] No past medical history on file. [2] No past surgical history on file. [3] No family history on file. [4]
